# Patient Record
Sex: Female | Born: 1935 | Race: White | Hispanic: No | Smoking: Former smoker
Health system: Southern US, Community
[De-identification: ages and names within clinical notes are randomized; demographics above are authoritative.]

## PROBLEM LIST (undated history)

## (undated) DIAGNOSIS — Z8616 Personal history of COVID-19: Secondary | ICD-10-CM

## (undated) DIAGNOSIS — H3530 Unspecified macular degeneration: Secondary | ICD-10-CM

## (undated) DIAGNOSIS — F32A Depression, unspecified: Secondary | ICD-10-CM

## (undated) DIAGNOSIS — E119 Type 2 diabetes mellitus without complications: Secondary | ICD-10-CM

## (undated) DIAGNOSIS — B0229 Other postherpetic nervous system involvement: Secondary | ICD-10-CM

## (undated) DIAGNOSIS — G629 Polyneuropathy, unspecified: Secondary | ICD-10-CM

## (undated) DIAGNOSIS — K219 Gastro-esophageal reflux disease without esophagitis: Secondary | ICD-10-CM

## (undated) DIAGNOSIS — H548 Legal blindness, as defined in USA: Secondary | ICD-10-CM

## (undated) DIAGNOSIS — D649 Anemia, unspecified: Secondary | ICD-10-CM

## (undated) DIAGNOSIS — I639 Cerebral infarction, unspecified: Secondary | ICD-10-CM

## (undated) DIAGNOSIS — E785 Hyperlipidemia, unspecified: Secondary | ICD-10-CM

## (undated) DIAGNOSIS — I509 Heart failure, unspecified: Secondary | ICD-10-CM

## (undated) DIAGNOSIS — F329 Major depressive disorder, single episode, unspecified: Secondary | ICD-10-CM

## (undated) DIAGNOSIS — I10 Essential (primary) hypertension: Secondary | ICD-10-CM

## (undated) HISTORY — DX: Major depressive disorder, single episode, unspecified: F32.9

## (undated) HISTORY — PX: COLONOSCOPY: SHX174

## (undated) HISTORY — PX: CATARACT EXTRACTION, BILATERAL: SHX1313

## (undated) HISTORY — DX: Heart failure, unspecified: I50.9

## (undated) HISTORY — PX: BREAST SURGERY: SHX581

## (undated) HISTORY — DX: Polyneuropathy, unspecified: G62.9

## (undated) HISTORY — PX: APPENDECTOMY: SHX54

## (undated) HISTORY — PX: ABDOMINAL HYSTERECTOMY: SHX81

## (undated) HISTORY — DX: Depression, unspecified: F32.A

## (undated) HISTORY — DX: Other postherpetic nervous system involvement: B02.29

## (undated) HISTORY — DX: Hyperlipidemia, unspecified: E78.5

## (undated) HISTORY — DX: Cerebral infarction, unspecified: I63.9

## (undated) HISTORY — DX: Anemia, unspecified: D64.9

## (undated) HISTORY — PX: ESOPHAGEAL DILATION: SHX303

## (undated) HISTORY — DX: Personal history of COVID-19: Z86.16

---

## 1997-12-29 ENCOUNTER — Other Ambulatory Visit: Admission: RE | Admit: 1997-12-29 | Discharge: 1997-12-29 | Payer: Self-pay | Admitting: Internal Medicine

## 1999-01-07 ENCOUNTER — Other Ambulatory Visit: Admission: RE | Admit: 1999-01-07 | Discharge: 1999-01-07 | Payer: Self-pay | Admitting: Internal Medicine

## 1999-12-27 ENCOUNTER — Other Ambulatory Visit: Admission: RE | Admit: 1999-12-27 | Discharge: 1999-12-27 | Payer: Self-pay | Admitting: Internal Medicine

## 2003-01-20 ENCOUNTER — Encounter: Admission: RE | Admit: 2003-01-20 | Discharge: 2003-01-20 | Payer: Self-pay | Admitting: Internal Medicine

## 2003-01-28 ENCOUNTER — Other Ambulatory Visit: Admission: RE | Admit: 2003-01-28 | Discharge: 2003-01-28 | Payer: Self-pay | Admitting: Internal Medicine

## 2003-05-22 ENCOUNTER — Encounter: Admission: RE | Admit: 2003-05-22 | Discharge: 2003-05-22 | Payer: Self-pay | Admitting: Neurosurgery

## 2003-06-09 ENCOUNTER — Encounter: Admission: RE | Admit: 2003-06-09 | Discharge: 2003-06-09 | Payer: Self-pay | Admitting: Neurosurgery

## 2016-01-13 ENCOUNTER — Emergency Department: Payer: Medicare Other

## 2016-01-13 ENCOUNTER — Encounter: Payer: Self-pay | Admitting: Emergency Medicine

## 2016-01-13 ENCOUNTER — Emergency Department
Admission: EM | Admit: 2016-01-13 | Discharge: 2016-01-13 | Disposition: A | Discharge status: Home / Self Care | Payer: Medicare Other | Attending: Emergency Medicine | Admitting: Emergency Medicine

## 2016-01-13 DIAGNOSIS — Y929 Unspecified place or not applicable: Secondary | ICD-10-CM | POA: Diagnosis not present

## 2016-01-13 DIAGNOSIS — W57XXXA Bitten or stung by nonvenomous insect and other nonvenomous arthropods, initial encounter: Secondary | ICD-10-CM | POA: Diagnosis not present

## 2016-01-13 DIAGNOSIS — Y939 Activity, unspecified: Secondary | ICD-10-CM | POA: Insufficient documentation

## 2016-01-13 DIAGNOSIS — E119 Type 2 diabetes mellitus without complications: Secondary | ICD-10-CM | POA: Diagnosis not present

## 2016-01-13 DIAGNOSIS — S0086XA Insect bite (nonvenomous) of other part of head, initial encounter: Secondary | ICD-10-CM | POA: Insufficient documentation

## 2016-01-13 DIAGNOSIS — R55 Syncope and collapse: Secondary | ICD-10-CM | POA: Insufficient documentation

## 2016-01-13 DIAGNOSIS — Z87891 Personal history of nicotine dependence: Secondary | ICD-10-CM | POA: Insufficient documentation

## 2016-01-13 DIAGNOSIS — Y999 Unspecified external cause status: Secondary | ICD-10-CM | POA: Insufficient documentation

## 2016-01-13 DIAGNOSIS — I10 Essential (primary) hypertension: Secondary | ICD-10-CM | POA: Insufficient documentation

## 2016-01-13 HISTORY — DX: Type 2 diabetes mellitus without complications: E11.9

## 2016-01-13 HISTORY — DX: Essential (primary) hypertension: I10

## 2016-01-13 HISTORY — DX: Legal blindness, as defined in USA: H54.8

## 2016-01-13 LAB — CBC WITH DIFFERENTIAL/PLATELET
Basophils Absolute: 0.1 10*3/uL (ref 0–0.1)
Basophils Relative: 1 %
Eosinophils Absolute: 0.4 10*3/uL (ref 0–0.7)
Eosinophils Relative: 3 %
HCT: 43.1 % (ref 35.0–47.0)
Hemoglobin: 14.6 g/dL (ref 12.0–16.0)
Lymphocytes Relative: 21 %
Lymphs Abs: 2.3 10*3/uL (ref 1.0–3.6)
MCH: 30.5 pg (ref 26.0–34.0)
MCHC: 33.9 g/dL (ref 32.0–36.0)
MCV: 89.9 fL (ref 80.0–100.0)
Monocytes Absolute: 0.8 10*3/uL (ref 0.2–0.9)
Monocytes Relative: 7 %
Neutro Abs: 7.5 10*3/uL — ABNORMAL HIGH (ref 1.4–6.5)
Neutrophils Relative %: 68 %
Platelets: 285 10*3/uL (ref 150–440)
RBC: 4.8 MIL/uL (ref 3.80–5.20)
RDW: 13.9 % (ref 11.5–14.5)
WBC: 11 10*3/uL (ref 3.6–11.0)

## 2016-01-13 LAB — COMPREHENSIVE METABOLIC PANEL
ALT: 16 U/L (ref 14–54)
AST: 21 U/L (ref 15–41)
Albumin: 3.5 g/dL (ref 3.5–5.0)
Alkaline Phosphatase: 68 U/L (ref 38–126)
Anion gap: 8 (ref 5–15)
BUN: 20 mg/dL (ref 6–20)
CO2: 27 mmol/L (ref 22–32)
Calcium: 8.8 mg/dL — ABNORMAL LOW (ref 8.9–10.3)
Chloride: 100 mmol/L — ABNORMAL LOW (ref 101–111)
Creatinine, Ser: 1.15 mg/dL — ABNORMAL HIGH (ref 0.44–1.00)
GFR calc Af Amer: 48 mL/min — ABNORMAL LOW (ref >60)
GFR calc non Af Amer: 42 mL/min — ABNORMAL LOW (ref >60)
Glucose, Bld: 215 mg/dL — ABNORMAL HIGH (ref 65–99)
Potassium: 4.1 mmol/L (ref 3.5–5.1)
Sodium: 135 mmol/L (ref 135–145)
Total Bilirubin: 0.7 mg/dL (ref 0.3–1.2)
Total Protein: 7.8 g/dL (ref 6.5–8.1)

## 2016-01-13 LAB — TROPONIN I: Troponin I: <0.03 ng/mL (ref <0.03)

## 2016-01-13 MED ORDER — SODIUM CHLORIDE 0.9 % IV BOLUS (SEPSIS)
500.0000 mL | Freq: Once | INTRAVENOUS | Status: AC
  Administered 2016-01-13: 500 mL via INTRAVENOUS

## 2016-01-13 NOTE — ED Triage Notes (Addendum)
Pt from home via [facility]. Reports getting stung by a yellow jacket this afternoon. She states she went to stand up and "blacked out and legs gave out and started violently shaking". Pt reports her friend assisted her to the ground. Denies hitting head or other injury. Denies hx of similar symptoms. Pt's friend reports that at the time of sting, patient was calm, sitting in chair. After several minutes patient became anxious, reporting a feeling of mouth swelling and leg numbness. Pt denies these symptoms upon arrival to ED. Hx of diabetes. Blood glucose for EMS was 284

## 2016-01-13 NOTE — Discharge Instructions (Signed)
As we discussed, your workup today was reassuring.  Though we do not know exactly what is causing your symptoms, it appears that you have no emergent medical condition at this time and that you are safe to go home and follow up as recommended in this paperwork. ° °Please return immediately to the Emergency Department if you develop any new or worsening symptoms that concern you. ° °

## 2016-01-13 NOTE — ED Provider Notes (Signed)
[facility] Emergency Department Provider Note  ____________________________________________   First MD Initiated Contact with Patient 01/13/16 1426     (approximate)  I have reviewed the triage vital signs and the nursing notes.   HISTORY  Chief Complaint Near Syncope    HPI Judy Harvey is a 82 y.o. female who is under hospice care and is confirmed a DNR/DNI who presents by EMS for evaluation of a possible allergic reaction.the patient reports that an insect of some kind landed on the right side of her feet and face along the line of her right chin and stung her.  She has never been stung by a bee or a yellowjacket before and became very upset and started feeling a constellation of symptoms such as shortness of breath, lightheadedness and dizziness, nausea, and anxiety.  She tried to get up and go in the house but had several near syncopal episodes and was "worried that the venom was going to my brain".  The people that were with her at the time reported to her hospice nurse, who came to the emergency department to discuss the case with me, that the patient ving a panic/anxiety attack which does happen to her from time to time. Upon arrival in the emergency department she states that she feels much better and her symptoms have resolved.  They were acute in onset and severe and nothing made them better or worse.   Past Medical History:  Diagnosis Date  . Diabetes mellitus without complication (HCC)   . Hypertension   . Legally blind     There are no active problems to display for this patient.   Past Surgical History:  Procedure Laterality Date  . ABDOMINAL HYSTERECTOMY     partial  . APPENDECTOMY    . BREAST SURGERY     reduction  . CESAREAN SECTION     x3    Prior to Admission medications   Not on File    Allergies Review of patient's allergies indicates no known allergies.  History reviewed. No pertinent family history.  Social  History Social History  Substance Use Topics  . Smoking status: Former Games developermoker  . Smokeless tobacco: Never Used  . Alcohol use No    Review of Systems Constitutional: No fever/chills Eyes: No visual changes. ENT: No sore throat. He will throat tightness, now resolved Cardiovascular: Denies chest pain. Respiratory: +shortness of breath. Gastrointestinal: No abdominal pain.  nausea, no vomiting.  No diarrhea.  No constipation. Genitourinary: Negative for dysuria. Musculoskeletal: Negative for back pain. Skin: Negative for rash. Neurological: Negative for headaches, focal weakness or numbness. Lightheadedness and dizziness and near syncope  10-point ROS otherwise negative.  ____________________________________________   PHYSICAL EXAM:  VITAL SIGNS: ED Triage Vitals  Enc Vitals Group     BP 01/13/16 1427 (!) 162/97     Pulse Rate 01/13/16 1427 94     Resp 01/13/16 1427 20     Temp 01/13/16 1427 97.8 F (36.6 C)     Temp Source 01/13/16 1427 Oral     SpO2 01/13/16 1427 95 %     Weight 01/13/16 1427 180 lb (81.6 kg)     Height 01/13/16 1427 5\' 2"  (1.575 m)     Head Circumference --      Peak Flow --      Pain Score 01/13/16 1428 2     Pain Loc --      Pain Edu? --      Excl. in  GC? --     Constitutional: Alert and oriented. Well appearing and in no acute distress. Eyes: Conjunctivae are normal. Legally blind Head: Atraumatic. Nose: No congestion/rhinnorhea. Mouth/Throat: Mucous membranes are moist.  Oropharynx non-erythematous. Neck: No stridor.  No meningeal signs.   Cardiovascular: Normal rate, regular rhythm. Good peripheral circulation. Grossly normal heart sounds. Respiratory: Normal respiratory effort.  No retractions. Lungs CTAB. Gastrointestinal: Soft and nontender. No distention.  Musculoskeletal: No lower extremity tenderness nor edema. No gross deformities of extremities. Neurologic:  Normal speech and language. No gross focal neurologic deficits are  appreciated.  Skin:  Skin is warm, dry and intact. No rash noted. No evidence of insect bite along the jawline where the patient is indicating. Psychiatric: Mood and affect are normal. Speech and behavior are normal.  ____________________________________________   LABS (all labs ordered are listed, but only abnormal results are displayed)  Labs Reviewed  COMPREHENSIVE METABOLIC PANEL - Abnormal; Notable for the following:       Result Value   Chloride 100 (*)    Glucose, Bld 215 (*)    Creatinine, Ser 1.15 (*)    Calcium 8.8 (*)    GFR calc non Af Amer 42 (*)    GFR calc Af Amer 48 (*)    All other components within normal limits  CBC WITH DIFFERENTIAL/PLATELET - Abnormal; Notable for the following:    Neutro Abs 7.5 (*)    All other components within normal limits  TROPONIN I  URINALYSIS COMPLETEWITH MICROSCOPIC ([facility] ONLY)   ____________________________________________  EKG  ED ECG REPORT I, Clorissa Gruenberg, the attending physician, personally viewed and interpreted this ECG.  Date: 01/13/2016 EKG Time: 2:22 PM Rate: 90 Rhythm: normal sinus rhythm QRS Axis: normal Intervals: right bundle-branch block and left anterior fascicular block ST/T Wave abnormalities: normal Conduction Disturbances: none Narrative Interpretation: unremarkable  ____________________________________________  RADIOLOGY   Ct Head Wo Contrast  Result Date: 01/13/2016 CLINICAL DATA:  Seizure with apparent fall EXAM: CT HEAD WITHOUT CONTRAST TECHNIQUE: Contiguous axial images were obtained from the base of the skull through the vertex without intravenous contrast. COMPARISON:  None. FINDINGS: Brain: There is mild diffuse atrophy. There is no intracranial mass, hemorrhage, extra-axial fluid collection, or midline shift. 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 Small vessel disease is also noted in the right thalamus. There is patchy small vessel disease in the  centra semiovale bilaterally. No acute appearing infarct is evident. Note that there is an apparent deep sulcus in the right medial temporal lobe, an apparent anatomic variant. Vascular: There is no demonstrable hyperdense vessel. There is calcification in the distal left vertebral artery as well as in the carotid siphon regions bilaterally. Skull: Bony calvarium appears intact. Sinuses/Orbits: There is opacification in a posterior ethmoid air cell on the right. Other paranasal sinuses which are visualized are clear. Orbits appear symmetric bilaterally. Other: Visualized mastoid air cells are clear. IMPRESSION: Atrophy with periventricular small vessel disease. There is small vessel disease in the right thalamus with evidence of a prior focal lacunar infarct in the left thalamus. No acute infarct evident. No intracranial mass, hemorrhage, or extra-axial fluid collection. There are foci of arterial vascular calcification at several sites. There is opacification of a posterior ethmoid air cell on the right. Electronically Signed   By: Bretta Bang III M.D.   On: 01/13/2016 14:59    ____________________________________________   PROCEDURES  Procedure(s) performed:   Procedures   Critical Care performed: No  ____________________________________________   INITIAL IMPRESSION / ASSESSMENT AND PLAN / ED COURSE  Pertinent labs & imaging results that were available during my care of the patient were reviewed by me and considered in my medical decision making (see chart for details).  The patient has a reassuring workup.  I had several in person conversations with her hospice nurse and everyone who knows her and provides care for her and with her is comfortable that she was having a panic attack after feeling the insect bite.  There is no evidence of allergic reaction or anaphylaxis and there is no suggestion that she is having any sort of cardiac abnormality.  The patient states that she feels much  better and is ready to go home.  She is convinced it was an insect bite that caused all of her symptoms.  There is no additional management recommended at this time and the hospice provider is comfortable taking her home  Clinical Course    ____________________________________________  FINAL CLINICAL IMPRESSION(S) / ED DIAGNOSES  Final diagnoses:  Near syncope  Insect bite, initial encounter     MEDICATIONS GIVEN DURING THIS VISIT:  Medications  sodium chloride 0.9 % bolus 500 mL (not administered)     NEW OUTPATIENT MEDICATIONS STARTED DURING THIS VISIT:  New Prescriptions   No medications on file    Modified Medications   No medications on file    Discontinued Medications   No medications on file     Note:  This document was prepared using Dragon voice recognition software and may include unintentional dictation errors.    Loleta Roseory Donley Harland, MD 01/13/16 [phone]

## 2016-01-13 NOTE — Progress Notes (Signed)
ED visit made. Patient is currently followed by [facility] at home with a  Hospice diagnosis of Heart failure. She is a DNR code.  Patient came to ED via EMS for evaluation after being stung by a bee and "passing out". Head CT negative for any acute process, labs WNL. Writer spoke with ED attending physician Dr. York CeriseForbach, plan is for discharge home. Patient seen lying on the ED stretcher, friend Olegario MessierKathy at bedside. Patient alert and oriented, able to recall the event. She was sitting on her porch and was stung "by a yellow jacket". She feels better now and is in agreement with discharge. Emotional support given. Hospice team made aware of discharge.  Notes faxed to Hospice triage. Dayna BarkerKaren Robertson RN, BSN, [facility], hospital Liaison [phone] c

## 2016-03-05 ENCOUNTER — Emergency Department
Admission: EM | Admit: 2016-03-05 | Discharge: 2016-03-06 | Disposition: A | Discharge status: Home / Self Care | Payer: Medicare Other | Attending: Emergency Medicine | Admitting: Emergency Medicine

## 2016-03-05 ENCOUNTER — Encounter: Payer: Self-pay | Admitting: *Deleted

## 2016-03-05 ENCOUNTER — Emergency Department: Payer: Medicare Other

## 2016-03-05 DIAGNOSIS — I10 Essential (primary) hypertension: Secondary | ICD-10-CM | POA: Insufficient documentation

## 2016-03-05 DIAGNOSIS — E119 Type 2 diabetes mellitus without complications: Secondary | ICD-10-CM | POA: Insufficient documentation

## 2016-03-05 DIAGNOSIS — Z87891 Personal history of nicotine dependence: Secondary | ICD-10-CM | POA: Insufficient documentation

## 2016-03-05 DIAGNOSIS — R55 Syncope and collapse: Secondary | ICD-10-CM | POA: Diagnosis present

## 2016-03-05 LAB — BASIC METABOLIC PANEL
Anion gap: 8 (ref 5–15)
BUN: 16 mg/dL (ref 6–20)
CO2: 27 mmol/L (ref 22–32)
Calcium: 8.9 mg/dL (ref 8.9–10.3)
Chloride: 103 mmol/L (ref 101–111)
Creatinine, Ser: 0.77 mg/dL (ref 0.44–1.00)
GFR calc Af Amer: >60 mL/min (ref >60)
GFR calc non Af Amer: >60 mL/min (ref >60)
Glucose, Bld: 112 mg/dL — ABNORMAL HIGH (ref 65–99)
Potassium: 3.7 mmol/L (ref 3.5–5.1)
Sodium: 138 mmol/L (ref 135–145)

## 2016-03-05 LAB — GLUCOSE, CAPILLARY: Glucose-Capillary: 109 mg/dL — ABNORMAL HIGH (ref 65–99)

## 2016-03-05 LAB — TROPONIN I: Troponin I: <0.03 ng/mL (ref <0.03)

## 2016-03-05 LAB — CBC
HCT: 43.9 % (ref 35.0–47.0)
Hemoglobin: 14.8 g/dL (ref 12.0–16.0)
MCH: 30.6 pg (ref 26.0–34.0)
MCHC: 33.7 g/dL (ref 32.0–36.0)
MCV: 90.9 fL (ref 80.0–100.0)
Platelets: 279 10*3/uL (ref 150–440)
RBC: 4.83 MIL/uL (ref 3.80–5.20)
RDW: 14.1 % (ref 11.5–14.5)
WBC: 10.5 10*3/uL (ref 3.6–11.0)

## 2016-03-05 MED ORDER — SODIUM CHLORIDE 0.9 % IV BOLUS (SEPSIS)
500.0000 mL | Freq: Once | INTRAVENOUS | Status: AC
  Administered 2016-03-05: 500 mL via INTRAVENOUS

## 2016-03-05 NOTE — ED Triage Notes (Signed)
Pt arrived to ED via [facility] after a reported near-syncopal episode. EMS reports pt was anxious upon EMS arrival to scene. Pt has a hx of HTN but has a BP of 224/110 with EMS with a heart rate of 78 taht EMS reports is showing a 1st degree AV block and RBB per 12 lead. Pt deneis chest pain or SOB at this time but is reporting heaviness in both of her legs. PT also has hx of diabetes and presents to ED with a CBG of 87. Pt alert and oriented x 4.

## 2016-03-05 NOTE — ED Provider Notes (Signed)
[facility] Emergency Department Provider Note   ____________________________________________   I have reviewed the triage vital signs and the nursing notes.   HISTORY  Chief Complaint Hypertension and Near Syncope   History limited by: Not Limited   HPI Judy Harvey is a 82 y.o. female who presents to the emergency department today after a near syncopal episode. The symptoms started this evening as the patient was preparing to go to dinner. Once at the restaurant she continued to feel weak and felt like both legs were weak. The patient then felt like she was going to pass out. Per EMS glucose was 83 on arrival and patient was hypertensive. Family thinks she is normally in the 200s. No recent illness. No fevers.    Past Medical History:  Diagnosis Date  . Diabetes mellitus without complication (HCC)   . Hypertension   . Legally blind     There are no active problems to display for this patient.   Past Surgical History:  Procedure Laterality Date  . ABDOMINAL HYSTERECTOMY     partial  . APPENDECTOMY    . BREAST SURGERY     reduction  . CESAREAN SECTION     x3    Prior to Admission medications   Not on File    Allergies Patient has no known allergies.  History reviewed. No pertinent family history.  Social History Social History  Substance Use Topics  . Smoking status: Former Games developermoker  . Smokeless tobacco: Never Used  . Alcohol use No    Review of Systems  Constitutional: Negative for fever. Positive for weakness. Cardiovascular: Negative for chest pain. Respiratory: Negative for shortness of breath. Gastrointestinal: Negative for abdominal pain, vomiting and diarrhea. Neurological: Negative for headaches, focal weakness or numbness.  10-point ROS otherwise negative.  ____________________________________________   PHYSICAL EXAM:  VITAL SIGNS: ED Triage Vitals  Enc Vitals Group     BP 03/05/16 2049 (!) 193/98   Pulse Rate 03/05/16 2049 71     Resp 03/05/16 2049 17     Temp 03/05/16 2049 97.8 F (36.6 C)     Temp Source 03/05/16 2049 Oral     SpO2 03/05/16 2049 96 %     Weight 03/05/16 2054 180 lb (81.6 kg)     Height 03/05/16 2054 5\' 2"  (1.575 m)   Constitutional: Alert and oriented. Well appearing and in no distress. Eyes: Conjunctivae are normal. Normal extraocular movements. ENT   Head: Normocephalic and atraumatic.   Nose: No congestion/rhinnorhea.   Mouth/Throat: Mucous membranes are moist.   Neck: No stridor. Hematological/Lymphatic/Immunilogical: No cervical lymphadenopathy. Cardiovascular: Normal rate, regular rhythm.  No murmurs, rubs, or gallops.  Respiratory: Normal respiratory effort without tachypnea nor retractions. Breath sounds are clear and equal bilaterally. No wheezes/rales/rhonchi. Gastrointestinal: Soft and non tender. No rebound. No guarding.  Genitourinary: Deferred Musculoskeletal: Normal range of motion in all extremities. No lower extremity edema. Neurologic:  Normal speech and language. No gross focal neurologic deficits are appreciated.  Skin:  Skin is warm, dry and intact. No rash noted. Psychiatric: Mood and affect are normal. Speech and behavior are normal. Patient exhibits appropriate insight and judgment.  ____________________________________________    LABS (pertinent positives/negatives)  Labs Reviewed  BASIC METABOLIC PANEL - Abnormal; Notable for the following:       Result Value   Glucose, Bld 112 (*)    All other components within normal limits  GLUCOSE, CAPILLARY - Abnormal; Notable for the following:    Glucose-Capillary 109 (*)  All other components within normal limits  CBC  TROPONIN I  URINALYSIS, COMPLETE ([facility]) WITH MICROSCOPIC  TROPONIN I  CBG MONITORING, ED   2nd trop pending at time of sign out.  ____________________________________________   EKG  I, Phineas SemenGraydon Jhoana Upham, attending physician, personally viewed and  interpreted this EKG  EKG Time: 2050 Rate: 82 Rhythm: normal sinus rhythm with PVC Axis: left axis deviation Intervals: qtc 493 WJX:[id number]:RBBB, LAFB ST changes: no st elevation Impression: abnormal ekg   ____________________________________________    RADIOLOGY  CXR IMPRESSION: No active cardiopulmonary disease. ____________________________________________   PROCEDURES  Procedures  ____________________________________________   INITIAL IMPRESSION / ASSESSMENT AND PLAN / ED COURSE  Pertinent labs & imaging results that were available during my care of the patient were reviewed by me and considered in my medical decision making (see chart for details).  Patient presented to the emergency department today after what sounds like a near syncopal episode. On exam here patient states that she is feeling back to baseline. Initial troponin was negative. Will plan on checking a second troponin. No addition it sounds like the patient's blood level was a little low when EMS arrived. Patient did state that she was eating dinner to slightly later time so wonder if this also played a role. EKG and chest x-ray without any concerning findings.  ____________________________________________   FINAL CLINICAL IMPRESSION(S) / ED DIAGNOSES  Near syncope  Note: This dictation was prepared with Dragon dictation. Any transcriptional errors that result from this process are unintentional     Phineas SemenGraydon Fritz Cauthon, MD 03/05/16 2322

## 2016-03-05 NOTE — Discharge Instructions (Addendum)
Please seek medical attention for any high fevers, chest pain, shortness of breath, change in behavior, persistent vomiting, bloody stool or any other new or concerning symptoms.  

## 2016-03-06 LAB — URINALYSIS, COMPLETE (UACMP) WITH MICROSCOPIC
Bacteria, UA: NONE SEEN
Bilirubin Urine: NEGATIVE
Glucose, UA: NEGATIVE mg/dL
Hgb urine dipstick: NEGATIVE
Ketones, ur: NEGATIVE mg/dL
Leukocytes, UA: NEGATIVE
Nitrite: NEGATIVE
Protein, ur: NEGATIVE mg/dL
Specific Gravity, Urine: 1.017 (ref 1.005–1.030)
pH: 5 (ref 5.0–8.0)

## 2016-03-06 LAB — TROPONIN I: Troponin I: <0.03 ng/mL (ref <0.03)

## 2016-03-06 NOTE — ED Provider Notes (Signed)
-----------------------------------------   1:27 AM on 03/06/2016 -----------------------------------------   Blood pressure (!) 165/57, pulse 74, temperature 97.8 F (36.6 C), temperature source Oral, resp. rate (!) 21, height 5\' 2"  (1.575 m), weight 180 lb (81.6 kg), SpO2 96 %.  Assuming care from Dr. Zenda AlpersWebster.  In short, Judy MorgansMildred J Harvey is a 82 y.o. female with a chief complaint of Hypertension and Near Syncope .  Refer to the original H&P for additional details.  The current plan of care is to follow up the repeat troponin.  The patient's repeat troponin is negative. She'll be discharged to home to follow up with her primary care physician.    Rebecka ApleyAllison P Javonte Elenes, MD 03/06/16 [phone]

## 2016-03-24 ENCOUNTER — Ambulatory Visit (INDEPENDENT_AMBULATORY_CARE_PROVIDER_SITE_OTHER): Payer: Medicare Other | Admitting: Family Medicine

## 2016-03-24 ENCOUNTER — Encounter: Payer: Self-pay | Admitting: Family Medicine

## 2016-03-24 ENCOUNTER — Other Ambulatory Visit: Payer: Self-pay | Admitting: Family Medicine

## 2016-03-24 ENCOUNTER — Telehealth: Payer: Self-pay

## 2016-03-24 VITALS — BP 118/76 | HR 78 | Resp 16 | Ht 62.0 in

## 2016-03-24 DIAGNOSIS — F418 Other specified anxiety disorders: Secondary | ICD-10-CM | POA: Diagnosis not present

## 2016-03-24 DIAGNOSIS — G2581 Restless legs syndrome: Secondary | ICD-10-CM

## 2016-03-24 DIAGNOSIS — S2239XA Fracture of one rib, unspecified side, initial encounter for closed fracture: Secondary | ICD-10-CM | POA: Insufficient documentation

## 2016-03-24 DIAGNOSIS — I10 Essential (primary) hypertension: Secondary | ICD-10-CM | POA: Insufficient documentation

## 2016-03-24 DIAGNOSIS — H548 Legal blindness, as defined in USA: Secondary | ICD-10-CM

## 2016-03-24 DIAGNOSIS — F32A Depression, unspecified: Secondary | ICD-10-CM | POA: Insufficient documentation

## 2016-03-24 DIAGNOSIS — S2249XA Multiple fractures of ribs, unspecified side, initial encounter for closed fracture: Secondary | ICD-10-CM | POA: Insufficient documentation

## 2016-03-24 DIAGNOSIS — Z8673 Personal history of transient ischemic attack (TIA), and cerebral infarction without residual deficits: Secondary | ICD-10-CM | POA: Insufficient documentation

## 2016-03-24 DIAGNOSIS — Z23 Encounter for immunization: Secondary | ICD-10-CM | POA: Diagnosis not present

## 2016-03-24 DIAGNOSIS — S2231XD Fracture of one rib, right side, subsequent encounter for fracture with routine healing: Secondary | ICD-10-CM

## 2016-03-24 DIAGNOSIS — M791 Myalgia, unspecified site: Secondary | ICD-10-CM | POA: Insufficient documentation

## 2016-03-24 DIAGNOSIS — Z8719 Personal history of other diseases of the digestive system: Secondary | ICD-10-CM | POA: Insufficient documentation

## 2016-03-24 DIAGNOSIS — H3530 Unspecified macular degeneration: Secondary | ICD-10-CM | POA: Diagnosis not present

## 2016-03-24 DIAGNOSIS — R110 Nausea: Secondary | ICD-10-CM | POA: Insufficient documentation

## 2016-03-24 DIAGNOSIS — K5900 Constipation, unspecified: Secondary | ICD-10-CM

## 2016-03-24 DIAGNOSIS — B0229 Other postherpetic nervous system involvement: Secondary | ICD-10-CM | POA: Insufficient documentation

## 2016-03-24 DIAGNOSIS — E1140 Type 2 diabetes mellitus with diabetic neuropathy, unspecified: Secondary | ICD-10-CM

## 2016-03-24 DIAGNOSIS — R269 Unspecified abnormalities of gait and mobility: Secondary | ICD-10-CM | POA: Insufficient documentation

## 2016-03-24 DIAGNOSIS — E119 Type 2 diabetes mellitus without complications: Secondary | ICD-10-CM | POA: Insufficient documentation

## 2016-03-24 DIAGNOSIS — Z794 Long term (current) use of insulin: Principal | ICD-10-CM

## 2016-03-24 DIAGNOSIS — R2681 Unsteadiness on feet: Secondary | ICD-10-CM

## 2016-03-24 MED ORDER — TRAMADOL HCL 50 MG PO TABS: 50.0000 mg | ORAL_TABLET | Freq: Three times a day (TID) | ORAL | 0 refills | Status: DC | PRN

## 2016-03-24 MED ORDER — ROPINIROLE HCL 1 MG PO TABS: 1.0000 mg | ORAL_TABLET | Freq: Every day | ORAL | 0 refills | Status: DC

## 2016-03-24 MED ORDER — ZOSTER VACCINE LIVE 19400 UNT/0.65ML ~~LOC~~ SUSR: 0.6500 mL | Freq: Once | SUBCUTANEOUS | 0 refills | Status: AC

## 2016-03-24 NOTE — Telephone Encounter (Signed)
Yes they will, I will reorder at next visit.

## 2016-03-24 NOTE — Telephone Encounter (Signed)
Lab told them they will be billed and they said it is very expensive

## 2016-03-24 NOTE — Telephone Encounter (Signed)
Medicare will not cover Vitamin labs. Did not get them.

## 2016-03-24 NOTE — Patient Instructions (Signed)
Take tramadol instead of Vicodin as needed for leg pain. Stop lorazepam and temazepam.

## 2016-03-24 NOTE — Progress Notes (Addendum)
Date:  03/24/2016   Name:  Judy Harvey   DOB:  [birth year]   MRN:  [id number]  PCP:  No PCP Per Patient    Chief Complaint: Establish Care   History of Present Illness:  This is a 83 y.o. female seen for initial visit. [place] [facility] was on hospice for unclear reason until 12/20 when discharged. Requip stopped and started on Vicodin which she takes 2-4 times daily for leg pain and PHN. Describes leg pain as aching, mostly at night, has to get out of bed but also c/o numbness and tingling of feet. Known T2DM on insulin for years. Old CVA on CT scan in November and old R rib fxs on CXR in December. Depression/anxiety on Wellbutrin for years, unsure if helping. Recent episode severe constipation x 3d which resolved with mg citrate. Hx esophageal stricture, sxs starting to recur, has had esophagus stretched in past. [place]. Hx PHN s/p multiple episodes zoster. HTN well controlled on lisinopril/HCTZ. Takes Ativan and Restoril at night to sleep. Hospice stopped pravastatin, tried on Lyrica which she did not tolerate due to body spasms. Knowm MD for which she gets shots in eye from optho, legally blind. Had flu imm in September and pneumo imm years ago. Thinks she is losing weight. Gets frequently nausea and uses Zofran daily. Unsteady on feet, uses cane, no recent falls.  Review of Systems:  Review of Systems  Constitutional: Positive for fatigue. Negative for fever.  HENT: Negative for ear pain and sore throat.   Eyes: Negative for pain.  Respiratory: Negative for cough and shortness of breath.   Cardiovascular: Negative for chest pain and leg swelling.  Gastrointestinal: Negative for abdominal pain.  Endocrine: Negative for polydipsia and polyuria.  Genitourinary: Negative for dysuria.  Neurological: Positive for weakness. Negative for tremors, syncope and light-headedness.  Hematological: Negative for adenopathy.   Psychiatric/Behavioral: Negative for agitation and confusion.    Patient Active Problem List   Diagnosis Date Noted  . Controlled type 2 diabetes mellitus with diabetic neuropathy (HCC) 03/24/2016  . Old cerebrovascular accident (CVA) without late effect 03/24/2016  . Hypertension 03/24/2016  . Legally blind 03/24/2016  . Macular degeneration 03/24/2016  . Rib fractures 03/24/2016  . Constipation 03/24/2016  . History of esophageal stricture 03/24/2016  . Depression with anxiety 03/24/2016  . Restless leg syndrome 03/24/2016  . Myalgia 03/24/2016    Prior to Admission medications   Medication Sig Start Date End Date Taking? Authorizing Provider  buPROPion (WELLBUTRIN XL) 150 MG 24 hr tablet Take 150 mg by mouth daily.   Yes Historical Provider, MD  hydrochlorothiazide (HYDRODIURIL) 25 MG tablet Take 25 mg by mouth daily.   Yes Historical Provider, MD  insulin glargine (LANTUS) 100 UNIT/ML injection Inject 46 Units into the skin at bedtime.   Yes Historical Provider, MD  insulin lispro (HUMALOG) 100 UNIT/ML injection Inject 5 Units into the skin 3 (three) times daily before meals.   Yes Historical Provider, MD  lisinopril (PRINIVIL,ZESTRIL) 2.5 MG tablet Take 2.5 mg by mouth daily.   Yes Historical Provider, MD  ondansetron (ZOFRAN) 8 MG tablet Take by mouth every 8 (eight) hours as needed for nausea or vomiting.   Yes Historical Provider, MD  Sennosides-Docusate Sodium (STOOL SOFTENER LAXATIVE PO) Take by mouth.   Yes Historical Provider, MD  rOPINIRole (REQUIP) 1 MG tablet Take 1 tablet (1 mg total) by mouth at bedtime. 03/24/16   Schuyler AmorWilliam Makenleigh Crownover, MD  traMADol (ULTRAM) 50 MG tablet Take 1 tablet (50 mg total) by mouth every 8 (eight) hours as needed. 03/24/16   Schuyler Amor, MD    No Known Allergies  Past Surgical History:  Procedure Laterality Date  . ABDOMINAL HYSTERECTOMY     partial  . APPENDECTOMY    . BREAST SURGERY     reduction  . CESAREAN SECTION     x3    Social  History  Substance Use Topics  . Smoking status: Former Games developer  . Smokeless tobacco: Never Used  . Alcohol use No    Family History  Problem Relation Age of Onset  . Family history unknown: Yes    Medication list has been reviewed and updated.  Physical Examination: BP 118/76   Pulse 78   Resp 16   Ht 5\' 2"  (1.575 m)   SpO2 98%   Physical Exam  Constitutional: She is oriented to person, place, and time. She appears well-developed and well-nourished.  HENT:  Right Ear: External ear normal.  Left Ear: External ear normal.  Nose: Nose normal.  Mouth/Throat: Oropharynx is clear and moist.  R ear occluded with cerumen  Eyes: Conjunctivae and EOM are normal. Pupils are equal, round, and reactive to light.  Neck: Neck supple. No thyromegaly present.  Cardiovascular: Normal rate, regular rhythm and normal heart sounds.   Pulmonary/Chest: Effort normal and breath sounds normal.  Abdominal: Soft. She exhibits no distension and no mass. There is no tenderness.  Musculoskeletal: She exhibits no edema.  Lymphadenopathy:    She has no cervical adenopathy.  Neurological: She is alert and oriented to person, place, and time. Coordination normal.  Romberg positive, gait wide-based and unsteady  Skin: Skin is warm and dry.  Psychiatric: She has a normal mood and affect. Her behavior is normal.  Nursing note and vitals reviewed.   Assessment and Plan:  1. Controlled type 2 diabetes mellitus with diabetic neuropathy, with long-term current use of insulin (HCC) Unclear control, d/c Vicodin, begin tramadol, consider conversion to gabapentin next visit - HgB A1c - TSH - B12  2. Essential hypertension Well controlled on lisinopril/HCTZ, consider MCR next visit  3. Old cerebrovascular accident (CVA) without late effect Consider asa next visit  4. Macular degeneration Consider optho referral  5. Legally blind  6. Closed fracture of one rib of right side with routine healing,  subsequent encounter - Vitamin D (25 hydroxy)  7. Constipation, unspecified constipation type Vicodin likely exacerbating, cont Senokot daily for now, consider fiber supplement  8. History of esophageal stricture Consider GI referral  9. Depression with anxiety Marginal control on Wellbutrin, consider change to SSRI/SNRI  10. Restless leg syndrome Restart Requip, consider gabpentin  11. Myalgia Check vit D, TSH  12. Postherpetic neuralgia Consider gabapentin, intolerant Lyrica  13. Gait instability Consider PT referral  14. Nausea May improve off Vicodin  Return in about 1 week (around 03/31/2016).  45 minutes spent with pt/son, over half in counseling  Deshanda Molitor M. [name] MD [facility]  03/24/2016   Addendum: Meds reviewed, consider adding asa, changing Wellbutrin to Cymbalta, d/c HCTZ, changing Requip to gabapentin with titration, and changing tramadol to Tylenol. Needs MCR and GI referral next visit

## 2016-03-24 NOTE — Addendum Note (Signed)
Addended by: Arthur HolmsLYNCH, TARA L on: 03/24/2016 03:00 PM   Modules accepted: Orders

## 2016-03-25 ENCOUNTER — Other Ambulatory Visit: Payer: Self-pay | Admitting: Family Medicine

## 2016-03-25 DIAGNOSIS — E559 Vitamin D deficiency, unspecified: Secondary | ICD-10-CM

## 2016-03-25 LAB — VITAMIN D 25 HYDROXY (VIT D DEFICIENCY, FRACTURES): Vit D, 25-Hydroxy: 11 ng/mL — ABNORMAL LOW (ref 30.0–100.0)

## 2016-03-25 LAB — HEMOGLOBIN A1C
Est. average glucose Bld gHb Est-mCnc: 169 mg/dL
Hgb A1c MFr Bld: 7.5 % — ABNORMAL HIGH (ref 4.8–5.6)

## 2016-03-25 LAB — TSH: TSH: 0.723 u[IU]/mL (ref 0.450–4.500)

## 2016-03-25 LAB — VITAMIN B12: Vitamin B-12: 554 pg/mL (ref 232–1245)

## 2016-03-25 MED ORDER — VITAMIN D 50 MCG (2000 UT) PO CAPS: 1.0000 | ORAL_CAPSULE | Freq: Every day | ORAL | Status: DC

## 2016-03-25 NOTE — Progress Notes (Signed)
Vitamin D started, consider asa next visit

## 2016-03-31 ENCOUNTER — Ambulatory Visit: Payer: Medicare Other | Admitting: Family Medicine

## 2016-04-01 ENCOUNTER — Ambulatory Visit (INDEPENDENT_AMBULATORY_CARE_PROVIDER_SITE_OTHER): Payer: Medicare Other | Admitting: Family Medicine

## 2016-04-01 ENCOUNTER — Encounter: Payer: Self-pay | Admitting: Family Medicine

## 2016-04-01 VITALS — BP 130/88 | HR 72 | Ht 62.0 in | Wt 175.0 lb

## 2016-04-01 DIAGNOSIS — I10 Essential (primary) hypertension: Secondary | ICD-10-CM

## 2016-04-01 DIAGNOSIS — Z794 Long term (current) use of insulin: Secondary | ICD-10-CM

## 2016-04-01 DIAGNOSIS — R2681 Unsteadiness on feet: Secondary | ICD-10-CM

## 2016-04-01 DIAGNOSIS — F418 Other specified anxiety disorders: Secondary | ICD-10-CM

## 2016-04-01 DIAGNOSIS — R110 Nausea: Secondary | ICD-10-CM

## 2016-04-01 DIAGNOSIS — E1140 Type 2 diabetes mellitus with diabetic neuropathy, unspecified: Secondary | ICD-10-CM

## 2016-04-01 DIAGNOSIS — K5900 Constipation, unspecified: Secondary | ICD-10-CM

## 2016-04-01 DIAGNOSIS — Z8719 Personal history of other diseases of the digestive system: Secondary | ICD-10-CM

## 2016-04-01 DIAGNOSIS — G2581 Restless legs syndrome: Secondary | ICD-10-CM

## 2016-04-01 DIAGNOSIS — Z8673 Personal history of transient ischemic attack (TIA), and cerebral infarction without residual deficits: Secondary | ICD-10-CM

## 2016-04-01 DIAGNOSIS — H3530 Unspecified macular degeneration: Secondary | ICD-10-CM

## 2016-04-01 DIAGNOSIS — E559 Vitamin D deficiency, unspecified: Secondary | ICD-10-CM

## 2016-04-01 LAB — GLUCOSE, POCT (MANUAL RESULT ENTRY): POC Glucose: 135 mg/dl — AB (ref 70–99)

## 2016-04-01 MED ORDER — ASPIRIN 81 MG PO TABS: 81.0000 mg | ORAL_TABLET | Freq: Every day | ORAL | Status: DC

## 2016-04-01 MED ORDER — GABAPENTIN 300 MG PO CAPS: 300.0000 mg | ORAL_CAPSULE | Freq: Every day | ORAL | 2 refills | Status: DC

## 2016-04-01 MED ORDER — LISINOPRIL 5 MG PO TABS: 5.0000 mg | ORAL_TABLET | Freq: Every day | ORAL | 3 refills | Status: DC

## 2016-04-01 MED ORDER — DULOXETINE HCL 30 MG PO CPEP: 30.0000 mg | ORAL_CAPSULE | Freq: Every day | ORAL | 2 refills | Status: DC

## 2016-04-01 NOTE — Patient Instructions (Addendum)
Stop hydrochlorothiazide, increase lisinopril to 5 mg daily. Begin OTC vitamin D3 2000 units daily. Begin aspirin 81 mg daily. Change Wellbutrin to Cymbalta 30 mg daily in morning. Change Requip to gabapentin 300 mg daily at bedtime.

## 2016-04-01 NOTE — Progress Notes (Signed)
Date:  04/01/2016   Name:  Judy Harvey   DOB:  [birth year]   MRN:  [id number]  PCP:  No PCP Per Patient    Chief Complaint: Follow-up (to discuss labs and needs Rx for Wellbutrin) and restless leg ("one requip is not holding me through the night")   History of Present Illness:  This is a 83 y.o. female seen in one week f/u from initial visit. At that time Restoril and Ativan stopped and Vicodin changed to tramadol but she apparently never filled tramadol rx. Requip restarted for RLS. Blood work showed adequate T2DM control and vit D deficiency, has not started supp yet. C/o poor RLS control and BLE pain at night, also intermittent nausea and dizziness, not checking sugars at home. Out of Wellbutrin, admits to depression and anxiety. Constipation about the same. [place]  Review of Systems:  Review of Systems  Constitutional: Negative for chills and fever.  HENT: Negative for trouble swallowing.   Respiratory: Negative for cough and shortness of breath.   Cardiovascular: Negative for chest pain and leg swelling.  Gastrointestinal: Negative for abdominal pain.  Genitourinary: Negative for difficulty urinating.  Neurological: Negative for tremors and syncope.  Psychiatric/Behavioral: Negative for agitation and confusion.    Patient Active Problem List   Diagnosis Date Noted  . Vitamin D deficiency 03/25/2016  . Controlled type 2 diabetes mellitus with diabetic neuropathy (HCC) 03/24/2016  . Old cerebrovascular accident (CVA) without late effect 03/24/2016  . Hypertension 03/24/2016  . Legally blind 03/24/2016  . Macular degeneration 03/24/2016  . Constipation 03/24/2016  . History of esophageal stricture 03/24/2016  . Depression with anxiety 03/24/2016  . Restless leg syndrome 03/24/2016  . Postherpetic neuralgia 03/24/2016  . Gait instability 03/24/2016  . Nausea 03/24/2016    Prior to Admission medications   Medication Sig  Start Date End Date Taking? Authorizing Provider  insulin glargine (LANTUS) 100 UNIT/ML injection Inject 46 Units into the skin at bedtime.   Yes Historical Provider, MD  insulin lispro (HUMALOG) 100 UNIT/ML injection Inject 5 Units into the skin 3 (three) times daily before meals.   Yes Historical Provider, MD  lisinopril (PRINIVIL,ZESTRIL) 5 MG tablet Take 1 tablet (5 mg total) by mouth daily. 04/01/16  Yes Schuyler AmorWilliam Jozette Castrellon, MD  ondansetron (ZOFRAN) 8 MG tablet Take by mouth every 8 (eight) hours as needed for nausea or vomiting.   Yes Historical Provider, MD  Sennosides-Docusate Sodium (STOOL SOFTENER LAXATIVE PO) Take by mouth.   Yes Historical Provider, MD  aspirin 81 MG tablet Take 1 tablet (81 mg total) by mouth daily. 04/01/16   Schuyler AmorWilliam Tunisia Landgrebe, MD  Cholecalciferol (VITAMIN D) 2000 units CAPS Take 1 capsule (2,000 Units total) by mouth daily. Patient not taking: Reported on 04/01/2016 03/25/16   Schuyler AmorWilliam Jorden Minchey, MD  DULoxetine (CYMBALTA) 30 MG capsule Take 1 capsule (30 mg total) by mouth daily. 04/01/16   Schuyler AmorWilliam Bernadetta Roell, MD  gabapentin (NEURONTIN) 300 MG capsule Take 1 capsule (300 mg total) by mouth at bedtime. 04/01/16   Schuyler AmorWilliam Lavonte Palos, MD    Allergies  Allergen Reactions  . Lyrica [Pregabalin]     Body spasms    Past Surgical History:  Procedure Laterality Date  . ABDOMINAL HYSTERECTOMY     partial  . APPENDECTOMY    . BREAST SURGERY     reduction  . CESAREAN SECTION     x3    Social History  Substance Use Topics  .  Smoking status: Former Games developer  . Smokeless tobacco: Never Used  . Alcohol use No    Family History  Problem Relation Age of Onset  . Family history unknown: Yes    Medication list has been reviewed and updated.  Physical Examination: BP 130/88   Pulse 72   Ht 5\' 2"  (1.575 m)   Wt 175 lb (79.4 kg)   BMI 32.01 kg/m   Physical Exam  Constitutional: She is oriented to person, place, and time. She appears well-developed and well-nourished.  Cardiovascular:  Normal rate, regular rhythm and normal heart sounds.   Pulmonary/Chest: Breath sounds normal.  Musculoskeletal: She exhibits no edema.  Neurological: She is alert and oriented to person, place, and time.  Skin: Skin is warm and dry.  Psychiatric: She has a normal mood and affect. Her behavior is normal.  Nursing note and vitals reviewed.   Assessment and Plan:  1. Controlled type 2 diabetes mellitus with diabetic neuropathy, with long-term current use of insulin (HCC) FSBG 135 today. a1c 7.5% last week, cont current regimen, consider home glucometer next visit - POCT Glucose (CBG)  2. Essential hypertension Adequate control but HCTZ may be contributing to DM/dizziness, d/c HCTZ, increase lisinopril to 5 mg daily, consider MCR next visit  3. Constipation, unspecified constipation type Continue daily Senokot  4. Old cerebrovascular accident (CVA) without late effect Begin asa  5. Restless leg syndrome Change Requip to gabapentin, should also help with neuropathy  6. Depression with anxiety Change Wellbutrin to Cymbalta, should also help with neuropathy  7. Vitamin D deficiency Begin OTC vitamin D3  8. Macular degeneration Consider optho referral next visit  9. History of esophageal stricture To see GI in [facility] on visit next 3 weeks  10. Gait instability Consider PT referral next visit  11. Nausea Cont prn Zofran  Call for any questions/concerns re: medication changes  Return in about 4 weeks (around 04/29/2016).  [name]. [name] MD [facility]  04/01/2016

## 2016-04-27 ENCOUNTER — Other Ambulatory Visit: Payer: Self-pay | Admitting: Family Medicine

## 2016-05-02 ENCOUNTER — Encounter: Payer: Self-pay | Admitting: Family Medicine

## 2016-05-02 ENCOUNTER — Ambulatory Visit (INDEPENDENT_AMBULATORY_CARE_PROVIDER_SITE_OTHER): Payer: Medicare Other | Admitting: Family Medicine

## 2016-05-02 VITALS — BP 174/87 | HR 81 | Resp 16 | Ht 62.0 in | Wt 173.0 lb

## 2016-05-02 DIAGNOSIS — G2581 Restless legs syndrome: Secondary | ICD-10-CM | POA: Diagnosis not present

## 2016-05-02 DIAGNOSIS — R110 Nausea: Secondary | ICD-10-CM

## 2016-05-02 DIAGNOSIS — E1140 Type 2 diabetes mellitus with diabetic neuropathy, unspecified: Secondary | ICD-10-CM

## 2016-05-02 DIAGNOSIS — H3530 Unspecified macular degeneration: Secondary | ICD-10-CM | POA: Diagnosis not present

## 2016-05-02 DIAGNOSIS — R350 Frequency of micturition: Secondary | ICD-10-CM | POA: Diagnosis not present

## 2016-05-02 DIAGNOSIS — I10 Essential (primary) hypertension: Secondary | ICD-10-CM | POA: Diagnosis not present

## 2016-05-02 DIAGNOSIS — F418 Other specified anxiety disorders: Secondary | ICD-10-CM | POA: Diagnosis not present

## 2016-05-02 DIAGNOSIS — E559 Vitamin D deficiency, unspecified: Secondary | ICD-10-CM

## 2016-05-02 DIAGNOSIS — E669 Obesity, unspecified: Secondary | ICD-10-CM

## 2016-05-02 DIAGNOSIS — Z794 Long term (current) use of insulin: Secondary | ICD-10-CM | POA: Diagnosis not present

## 2016-05-02 DIAGNOSIS — E66811 Obesity, class 1: Secondary | ICD-10-CM

## 2016-05-02 DIAGNOSIS — Z8719 Personal history of other diseases of the digestive system: Secondary | ICD-10-CM

## 2016-05-02 LAB — POCT URINALYSIS DIPSTICK
BILIRUBIN UA: NEGATIVE
Blood, UA: NEGATIVE
Glucose, UA: NEGATIVE
Ketones, UA: NEGATIVE
LEUKOCYTES UA: NEGATIVE
Nitrite, UA: NEGATIVE
PH UA: 5
PROTEIN UA: NEGATIVE
SPEC GRAV UA: 1.01
Urobilinogen, UA: 0.2

## 2016-05-02 MED ORDER — LISINOPRIL 10 MG PO TABS: 10.0000 mg | ORAL_TABLET | Freq: Every day | ORAL | 2 refills | Status: DC

## 2016-05-02 MED ORDER — ROPINIROLE HCL 2 MG PO TABS: 1.0000 mg | ORAL_TABLET | Freq: Every day | ORAL | 2 refills | Status: DC

## 2016-05-02 NOTE — Patient Instructions (Signed)
Increase lisinopril to 10 mg daily. Increase Requip to 2 mg daily at bedtime.

## 2016-05-02 NOTE — Progress Notes (Signed)
Date:  05/02/2016   Name:  Judy Harvey   DOB:  [birth year]   MRN:  [id number]  PCP:  No PCP Per Patient    Chief Complaint: Diabetes (Can not see to check BS-May benefit from [facility] to go by and check medication issues and check BS levels. ) and Urinary Frequency (Pain in lower abdominal area and going alot. )   History of Present Illness:  This is a 83 y.o. female seen in one month f/u. Still taking HCTZ and Wellbutrin, c/o dry mouth and intermittent dizziness. Mood ok, still gets anxious about family's health. [place] thinks would stay more active there than [place]. Can't see to check BG's or ambulate outside, received eye injections for 10 yrs, optho said nothing more to do, declines referral to optho here. Denies hx CVA, not taking asa. RLS somewhat controlled on Requip but doesn't get through night. Couldn't tolerate gabapentin, caused body spasms just like Lyrica. Taking vit D supp. Constipation ok but having some intermittent lower abd pain past week. [place] and [place]ophagus [place], put on Prevacid x 30d with 3R (no records available). Asks which meds she can stop.  Review of Systems:  Review of Systems  Constitutional: Negative for chills and fever.  HENT: Negative for trouble swallowing.   Respiratory: Negative for cough and shortness of breath.   Cardiovascular: Negative for chest pain and leg swelling.  Endocrine: Negative for polydipsia and polyuria.  Genitourinary: Negative for vaginal bleeding.  Neurological: Negative for tremors and syncope.  Psychiatric/Behavioral: Negative for agitation and confusion.    Patient Active Problem List   Diagnosis Date Noted  . Obesity (BMI 30.0-34.9) 05/02/2016  . Vitamin D deficiency 03/25/2016  . Controlled type 2 diabetes mellitus with diabetic neuropathy (HCC) 03/24/2016  . Old cerebrovascular accident (CVA) without late effect 03/24/2016  . Hypertension 03/24/2016   . Legally blind 03/24/2016  . Macular degeneration 03/24/2016  . Constipation 03/24/2016  . History of esophageal stricture 03/24/2016  . Depression with anxiety 03/24/2016  . Restless leg syndrome 03/24/2016  . Postherpetic neuralgia 03/24/2016  . Gait instability 03/24/2016  . Nausea 03/24/2016    Prior to Admission medications   Medication Sig Start Date End Date Taking? Authorizing Provider  Cholecalciferol (VITAMIN D) 2000 units CAPS Take 1 capsule (2,000 Units total) by mouth daily. 03/25/16  Yes Schuyler AmorWilliam Brittiny Levitz, MD  DULoxetine (CYMBALTA) 30 MG capsule Take 1 capsule (30 mg total) by mouth daily. 04/01/16  Yes Schuyler AmorWilliam Blossie Raffel, MD  insulin lispro (HUMALOG) 100 UNIT/ML injection Inject 5 Units into the skin 3 (three) times daily before meals.   Yes Historical Provider, MD  LANTUS SOLOSTAR 100 UNIT/ML Solostar Pen INJECT 46 UNITS AT BEDTIME ONCE A DAY SUBCUTANEOUSLY 04/27/16  Yes Schuyler AmorWilliam Bona Hubbard, MD  lisinopril (PRINIVIL,ZESTRIL) 10 MG tablet Take 1 tablet (10 mg total) by mouth daily. 05/02/16  Yes Schuyler AmorWilliam Geanette Buonocore, MD  ondansetron (ZOFRAN) 4 MG tablet Take 4 mg by mouth every 8 (eight) hours as needed for nausea or vomiting.   Yes Historical Provider, MD  pantoprazole (PROTONIX) 40 MG tablet Take 40 mg by mouth daily.   Yes Historical Provider, MD  rOPINIRole (REQUIP) 2 MG tablet Take 0.5 tablets (1 mg total) by mouth at bedtime. 05/02/16  Yes Schuyler AmorWilliam Tira Lafferty, MD  Sennosides-Docusate Sodium (STOOL SOFTENER LAXATIVE PO) Take by mouth.   Yes Historical Provider, MD    Allergies  Allergen Reactions  . Gabapentin  Body spasms  . Lyrica [Pregabalin]     Body spasms    Past Surgical History:  Procedure Laterality Date  . ABDOMINAL HYSTERECTOMY     partial  . APPENDECTOMY    . BREAST SURGERY     reduction  . CESAREAN SECTION     x3    Social History  Substance Use Topics  . Smoking status: Former Games developer  . Smokeless tobacco: Never Used  . Alcohol use No    Family History   Problem Relation Age of Onset  . Family history unknown: Yes    Medication list has been reviewed and updated.  Physical Examination: BP (!) 174/87 (BP Location: Right Arm)   Pulse 81   Resp 16   Ht 5\' 2"  (1.575 m)   Wt 173 lb (78.5 kg)   SpO2 97%   BMI 31.64 kg/m   Physical Exam  Constitutional: She is oriented to person, place, and time. She appears well-developed and well-nourished.  Cardiovascular: Normal rate, regular rhythm and normal heart sounds.   Pulmonary/Chest: Effort normal and breath sounds normal.  Abdominal: Soft. She exhibits no distension. There is no tenderness.  Musculoskeletal: She exhibits no edema.  Neurological: She is alert and oriented to person, place, and time.  Skin: Skin is warm and dry.  Psychiatric: She has a normal mood and affect. Her behavior is normal.  Nursing note and vitals reviewed.   Assessment and Plan:  1. Controlled type 2 diabetes mellitus with diabetic neuropathy, with long-term current use of insulin (HCC) Adequate control with a1c 7.5% last month, cont Lantus/Humalog regimen  2. Essential hypertension Poor control, increase lisinopril to 10 mg daily, d/c HCTZ as likely contributing to dry mouth/dizziness  3. Restless leg syndrome Marginal control, increase Requip to 2 mg qhs  4. Macular degeneration Told [facility] no further rx available, declines referral to optho here  5. Depression with anxiety Well controlled on Cymbalta, d/c Wellbutrin  6. History of esophageal stricture Dilation per GI in [place] last week, cont Prevacid for now  7. Vitamin D deficiency On supplement, consider level next visit  8. Urine frequency UA negative, monitor - POCT urinalysis dipstick  9. Nausea Unclear etiology, cont Zofran prn  10. Obesity (BMI 30.0-34.9) Unable to exercise due to blindness, discussed benefits of exercise/activity which may improve with ALS placement  Return in about 4 weeks (around  05/30/2016).  [name]. [name] MD [facility]  05/02/2016

## 2016-05-04 ENCOUNTER — Telehealth: Payer: Self-pay

## 2016-05-04 ENCOUNTER — Other Ambulatory Visit: Payer: Self-pay | Admitting: Family Medicine

## 2016-05-04 MED ORDER — ROPINIROLE HCL 2 MG PO TABS: 2.0000 mg | ORAL_TABLET | Freq: Every day | ORAL | 2 refills | Status: DC

## 2016-05-04 NOTE — Telephone Encounter (Signed)
Called about medication instructions. Roprin. Rx saig take 0.5 of 2mg  tab and was increase from 1mg  but that is same dose. advised to take whole pill.

## 2016-05-30 ENCOUNTER — Ambulatory Visit (INDEPENDENT_AMBULATORY_CARE_PROVIDER_SITE_OTHER): Payer: Medicare Other | Admitting: Family Medicine

## 2016-05-30 ENCOUNTER — Encounter: Payer: Self-pay | Admitting: Family Medicine

## 2016-05-30 ENCOUNTER — Ambulatory Visit: Payer: Medicare Other | Admitting: Family Medicine

## 2016-05-30 VITALS — BP 136/82 | HR 86 | Resp 16 | Ht 62.0 in | Wt 176.0 lb

## 2016-05-30 DIAGNOSIS — I10 Essential (primary) hypertension: Secondary | ICD-10-CM

## 2016-05-30 DIAGNOSIS — E1140 Type 2 diabetes mellitus with diabetic neuropathy, unspecified: Secondary | ICD-10-CM

## 2016-05-30 DIAGNOSIS — G2581 Restless legs syndrome: Secondary | ICD-10-CM | POA: Diagnosis not present

## 2016-05-30 DIAGNOSIS — R110 Nausea: Secondary | ICD-10-CM

## 2016-05-30 DIAGNOSIS — R351 Nocturia: Secondary | ICD-10-CM | POA: Diagnosis not present

## 2016-05-30 DIAGNOSIS — E559 Vitamin D deficiency, unspecified: Secondary | ICD-10-CM | POA: Diagnosis not present

## 2016-05-30 DIAGNOSIS — F418 Other specified anxiety disorders: Secondary | ICD-10-CM | POA: Diagnosis not present

## 2016-05-30 DIAGNOSIS — E669 Obesity, unspecified: Secondary | ICD-10-CM | POA: Diagnosis not present

## 2016-05-30 DIAGNOSIS — Z794 Long term (current) use of insulin: Secondary | ICD-10-CM

## 2016-05-30 MED ORDER — DULOXETINE HCL 30 MG PO CPEP: 30.0000 mg | ORAL_CAPSULE | Freq: Every day | ORAL | 3 refills | Status: DC

## 2016-05-30 MED ORDER — ONDANSETRON HCL 8 MG PO TABS: 4.0000 mg | ORAL_TABLET | Freq: Three times a day (TID) | ORAL | 3 refills | Status: DC | PRN

## 2016-05-30 NOTE — Progress Notes (Signed)
Date:  05/30/2016   Name:  Judy Harvey   DOB:  1928-02-02   MRN:  [id number]  PCP:  Schuyler Amor, MD    Chief Complaint: Diabetes (Can not see to check BS and has no machine. ) and Nausea (Feels sick when she gets up and very hungry but nausea medicine helps wants refill 8 mg Zofran )   History of Present Illness:  This is a 83 y.o. female seen in one month f/u. T2DM well controlled on current regimen with a1c 7.5% in Jan. Lisinopril increased and HCTZ stopped last visit, dry mouth improved. Requip dose increased for RLS, sxs about the same, having more nausea in AM and having to take odansetron daily, needs refill. Depression sxs well controlled on Cymbalta off Wellbutrin, GI sxs stable on Prevacid. Taking vit D supplement daily. C/o nocturia x 3-4, that and RLS make it hard to sleep.  Review of Systems:  Review of Systems  Constitutional: Negative for chills, fever and unexpected weight change.  Respiratory: Negative for cough and shortness of breath.   Cardiovascular: Negative for chest pain and leg swelling.  Gastrointestinal: Negative for abdominal pain.  Endocrine: Negative for polydipsia and polyuria.  Genitourinary: Negative for dysuria.  Neurological: Negative for tremors, syncope and light-headedness.    Patient Active Problem List   Diagnosis Date Noted  . Obesity (BMI 30.0-34.9) 05/02/2016  . Vitamin D deficiency 03/25/2016  . Controlled type 2 diabetes mellitus with diabetic neuropathy (HCC) 03/24/2016  . Old cerebrovascular accident (CVA) without late effect 03/24/2016  . Hypertension 03/24/2016  . Legally blind 03/24/2016  . Macular degeneration 03/24/2016  . Constipation 03/24/2016  . History of esophageal stricture 03/24/2016  . Depression with anxiety 03/24/2016  . Restless leg syndrome 03/24/2016  . Postherpetic neuralgia 03/24/2016  . Gait instability 03/24/2016  . Nausea 03/24/2016    Prior to Admission medications   Medication Sig Start Date End  Date Taking? Authorizing Provider  Cholecalciferol (VITAMIN D) 2000 units CAPS Take 1 capsule (2,000 Units total) by mouth daily. 03/25/16  Yes Schuyler Amor, MD  DULoxetine (CYMBALTA) 30 MG capsule Take 1 capsule (30 mg total) by mouth daily. 05/30/16  Yes Schuyler Amor, MD  insulin lispro (HUMALOG) 100 UNIT/ML injection Inject 5 Units into the skin 3 (three) times daily before meals.   Yes Historical Provider, MD  LANTUS SOLOSTAR 100 UNIT/ML Solostar Pen INJECT 46 UNITS AT BEDTIME ONCE A DAY SUBCUTANEOUSLY 04/27/16  Yes Schuyler Amor, MD  lisinopril (PRINIVIL,ZESTRIL) 10 MG tablet Take 1 tablet (10 mg total) by mouth daily. 05/02/16  Yes Schuyler Amor, MD  ondansetron (ZOFRAN) 8 MG tablet Take 0.5 tablets (4 mg total) by mouth every 8 (eight) hours as needed for nausea. 05/30/16  Yes Schuyler Amor, MD  pantoprazole (PROTONIX) 40 MG tablet Take 40 mg by mouth daily.   Yes Historical Provider, MD  rOPINIRole (REQUIP) 2 MG tablet Take 1 tablet (2 mg total) by mouth at bedtime. 05/04/16  Yes Schuyler Amor, MD  Sennosides-Docusate Sodium (STOOL SOFTENER LAXATIVE PO) Take by mouth.   Yes Historical Provider, MD    Allergies  Allergen Reactions  . Gabapentin     Body spasms  . Lyrica [Pregabalin]     Body spasms    Past Surgical History:  Procedure Laterality Date  . ABDOMINAL HYSTERECTOMY     partial  . APPENDECTOMY    . BREAST SURGERY     reduction  . CESAREAN SECTION     x3  Social History  Substance Use Topics  . Smoking status: Former Games developermoker  . Smokeless tobacco: Never Used  . Alcohol use No    Family History  Problem Relation Age of Onset  . Family history unknown: Yes    Medication list has been reviewed and updated.  Physical Examination: BP 136/82   Pulse 86   Resp 16   Ht 5\' 2"  (1.575 m)   Wt 176 lb (79.8 kg)   SpO2 96%   BMI 32.19 kg/m   Physical Exam  Constitutional: She appears well-developed and well-nourished.  Cardiovascular: Normal rate, regular rhythm  and normal heart sounds.   Pulmonary/Chest: Effort normal and breath sounds normal.  Musculoskeletal: She exhibits no edema.  Neurological: She is alert.  Skin: Skin is warm and dry.  Psychiatric: She has a normal mood and affect. Her behavior is normal.  Nursing note and vitals reviewed.   Assessment and Plan:  1. Controlled type 2 diabetes mellitus with diabetic neuropathy, with long-term current use of insulin (HCC) Well controlled, cont current Lantus/Humalog regimen, repeat a1c next visit  2. Essential hypertension Improved control on increased lisinopril, dry mouth improved off HCTZ  3. Depression with anxiety Well controlled on Cymbalta, off Wellbutrin  4. Restless leg syndrome Marginal control on increased Requip, may be contributing to AM nausea  5. Nausea Unclear etiology, trial off Requip (intolerant gabapentin), refill odansetron, consider change to Requip XR  6. Vitamin D deficiency On supplement, repeat level next visit  7. Nocturia Unclear etiology, avoid anticholinergics for now  8. Obesity (BMI 30.0-34.9) Exercise/weight loss discussed  Return in about 4 weeks (around 06/27/2016).  Dionne AnoWilliam M. Kingsley SpittlePlonk, Jr. MD [facility]  05/30/2016

## 2016-05-30 NOTE — Patient Instructions (Signed)
Try stopping Requip for few days to see if helps morning nausea. May restart if nausea unimproved.

## 2016-06-08 ENCOUNTER — Encounter: Payer: Self-pay | Admitting: Family Medicine

## 2016-06-27 ENCOUNTER — Encounter: Payer: Self-pay | Admitting: Family Medicine

## 2016-06-27 ENCOUNTER — Ambulatory Visit (INDEPENDENT_AMBULATORY_CARE_PROVIDER_SITE_OTHER): Payer: Medicare Other | Admitting: Family Medicine

## 2016-06-27 ENCOUNTER — Telehealth: Payer: Self-pay

## 2016-06-27 VITALS — BP 118/72 | HR 82 | Resp 16 | Ht 62.0 in | Wt 176.2 lb

## 2016-06-27 DIAGNOSIS — G2581 Restless legs syndrome: Secondary | ICD-10-CM

## 2016-06-27 DIAGNOSIS — E559 Vitamin D deficiency, unspecified: Secondary | ICD-10-CM | POA: Diagnosis not present

## 2016-06-27 DIAGNOSIS — K5901 Slow transit constipation: Secondary | ICD-10-CM

## 2016-06-27 DIAGNOSIS — F418 Other specified anxiety disorders: Secondary | ICD-10-CM

## 2016-06-27 DIAGNOSIS — E1140 Type 2 diabetes mellitus with diabetic neuropathy, unspecified: Secondary | ICD-10-CM | POA: Diagnosis not present

## 2016-06-27 DIAGNOSIS — R110 Nausea: Secondary | ICD-10-CM | POA: Diagnosis not present

## 2016-06-27 DIAGNOSIS — I10 Essential (primary) hypertension: Secondary | ICD-10-CM | POA: Diagnosis not present

## 2016-06-27 DIAGNOSIS — Z794 Long term (current) use of insulin: Secondary | ICD-10-CM

## 2016-06-27 DIAGNOSIS — E669 Obesity, unspecified: Secondary | ICD-10-CM | POA: Diagnosis not present

## 2016-06-27 DIAGNOSIS — E66811 Obesity, class 1: Secondary | ICD-10-CM

## 2016-06-27 MED ORDER — GABAPENTIN 100 MG PO CAPS: 100.0000 mg | ORAL_CAPSULE | Freq: Every day | ORAL | 3 refills | Status: DC

## 2016-06-27 MED ORDER — INSULIN GLARGINE 100 UNIT/ML SOLOSTAR PEN: PEN_INJECTOR | SUBCUTANEOUS | 3 refills | Status: DC

## 2016-06-27 MED ORDER — GABAPENTIN 300 MG PO CAPS: 300.0000 mg | ORAL_CAPSULE | Freq: Every day | ORAL | 3 refills | Status: DC

## 2016-06-27 MED ORDER — SENNOSIDES-DOCUSATE SODIUM 8.6-50 MG PO TABS: 1.0000 | ORAL_TABLET | Freq: Every day | ORAL | Status: DC

## 2016-06-27 NOTE — Telephone Encounter (Signed)
Son called about Gabapentin. I entered it as 100 mg and it was really 300 mg once at night. I told him I will fix in chart and have you send in new Rx. They agree to take 3 of the 100 mg until gone then pick up the 300 mg RX. Changes already made to med list just send refill for correct 300 mg.

## 2016-06-27 NOTE — Progress Notes (Signed)
Date:  06/27/2016   Name:  Judy Harvey   DOB:  January 29, 1928   MRN:  [id number]  PCP:  Schuyler Amor, Harvey    Chief Complaint: Diabetes (refill Lantus and gabapentin ) and Constipation (Taking laxative to help move things but very constipated. )   History of Present Illness:  This is a 83 y.o. female seen in one month f/u. States sugars ok but eating too much starch. Gabapentin helping RLS, off Requip, less nausea.Marland Kitchen HTN on lisinopril, depression ok on Cymbalta, taking vit D supp. C/o constipation, BM only q 3-4 days, stool softener not helping.  Review of Systems:  Review of Systems  Constitutional: Negative for chills and fever.  Respiratory: Negative for cough and shortness of breath.   Cardiovascular: Negative for chest pain and leg swelling.  Gastrointestinal: Negative for blood in stool.  Endocrine: Negative for polydipsia and polyuria.  Neurological: Negative for syncope and light-headedness.    Patient Active Problem List   Diagnosis Date Noted  . Obesity (BMI 30.0-34.9) 05/02/2016  . Vitamin D deficiency 03/25/2016  . Controlled type 2 diabetes mellitus with diabetic neuropathy (HCC) 03/24/2016  . Old cerebrovascular accident (CVA) without late effect 03/24/2016  . Hypertension 03/24/2016  . Legally blind 03/24/2016  . Macular degeneration 03/24/2016  . Constipation 03/24/2016  . History of esophageal stricture 03/24/2016  . Depression with anxiety 03/24/2016  . Restless leg syndrome 03/24/2016  . Postherpetic neuralgia 03/24/2016  . Gait abnormality 03/24/2016  . Nausea 03/24/2016    Prior to Admission medications   Medication Sig Start Date End Date Taking? Authorizing Provider  Cholecalciferol (VITAMIN D) 2000 units CAPS Take 1 capsule (2,000 Units total) by mouth daily. 03/25/16  Yes Schuyler Amor, Harvey  DULoxetine (CYMBALTA) 30 MG capsule Take 1 capsule (30 mg total) by mouth daily. 05/30/16  Yes Schuyler Amor, Harvey  gabapentin (NEURONTIN) 300 MG capsule Take 1  capsule (300 mg total) by mouth at bedtime. 06/27/16  Yes Schuyler Amor, Harvey  Insulin Glargine (LANTUS SOLOSTAR) 100 UNIT/ML Solostar Pen INJECT 46 UNITS AT BEDTIME ONCE A DAY SUBCUTANEOUSLY 06/27/16  Yes Schuyler Amor, Harvey  insulin lispro (HUMALOG) 100 UNIT/ML injection Inject 5 Units into the skin 3 (three) times daily before meals.   Yes Historical Provider, Harvey  lisinopril (PRINIVIL,ZESTRIL) 10 MG tablet Take 1 tablet (10 mg total) by mouth daily. 05/02/16  Yes Schuyler Amor, Harvey  ondansetron (ZOFRAN) 8 MG tablet Take 0.5 tablets (4 mg total) by mouth every 8 (eight) hours as needed for nausea. 05/30/16  Yes Schuyler Amor, Harvey  pantoprazole (PROTONIX) 40 MG tablet Take 40 mg by mouth daily.   Yes Historical Provider, Harvey  senna-docusate (SENOKOT S) 8.6-50 MG tablet Take 1 tablet by mouth daily. 06/27/16   Schuyler Amor, Harvey    Allergies  Allergen Reactions  . Lyrica [Pregabalin]     Body spasms    Past Surgical History:  Procedure Laterality Date  . ABDOMINAL HYSTERECTOMY     partial  . APPENDECTOMY    . BREAST SURGERY     reduction  . CESAREAN SECTION     x3    Social History  Substance Use Topics  . Smoking status: Former Games developer  . Smokeless tobacco: Never Used  . Alcohol use No    Family History  Problem Relation Age of Onset  . Family history unknown: Yes    Medication list has been reviewed and updated.  Physical Examination: BP 118/72   Pulse 82   Resp 16  Ht  (1.575 m)   Wt 176 lb 3.2 oz (79.9 kg)   SpO2 95%   BMI 32.23 kg/m   Physical Exam  Constitutional: She appears well-developed and well-nourished.  Cardiovascular: Normal rate, regular rhythm and normal heart sounds.   Pulmonary/Chest: Effort normal and breath sounds normal.  Musculoskeletal: She exhibits no edema.  Neurological: She is alert.  Skin: Skin is warm and dry.  Psychiatric: She has a normal mood and affect. Her behavior is normal.  Nursing note and vitals reviewed.   Assessment and  Plan:  1. Controlled type 2 diabetes mellitus with diabetic neuropathy, with long-term current use of insulin (HCC) Well contolled, last a1c 7.5% in Jan - HgB A1c - Urine Microalbumin w/creat. ratio  2. Essential hypertension Well controlled on lisinopril  3. Slow transit constipation May have some degree of autonomic dysfunction, trial Senokot daily  4. Depression with anxiety Well controlled on Cymbalta  5. Restless leg syndrome Improved control on gabapentin  6. Nausea Improved off Requip  7. Vitamin D deficiency On supplement - Vitamin D (25 hydroxy)  8. Obesity (BMI 30.0-34.9) Exercise/weight loss discussed  Return in about 3 months (around 09/26/2016).  Judy Harvey. Judy Harvey [facility]  06/28/2016

## 2016-06-27 NOTE — Telephone Encounter (Signed)
Done

## 2016-06-27 NOTE — Patient Instructions (Signed)
Take Senokot daily for constipation.

## 2016-06-28 ENCOUNTER — Other Ambulatory Visit: Payer: Self-pay | Admitting: Family Medicine

## 2016-06-28 ENCOUNTER — Other Ambulatory Visit: Payer: Self-pay

## 2016-06-28 LAB — MICROALBUMIN / CREATININE URINE RATIO

## 2016-06-28 LAB — VITAMIN D 25 HYDROXY (VIT D DEFICIENCY, FRACTURES): Vit D, 25-Hydroxy: 20.8 ng/mL — ABNORMAL LOW (ref 30.0–100.0)

## 2016-06-28 LAB — HEMOGLOBIN A1C
ESTIMATED AVERAGE GLUCOSE: 157 mg/dL
HEMOGLOBIN A1C: 7.1 % — AB (ref 4.8–5.6)

## 2016-06-28 MED ORDER — INSULIN PEN NEEDLE 31G X 5 MM MISC: 5 refills | Status: DC

## 2016-06-28 MED ORDER — VITAMIN D3 125 MCG (5000 UT) PO CAPS: 1.0000 | ORAL_CAPSULE | Freq: Every day | ORAL | Status: AC

## 2016-06-28 NOTE — Progress Notes (Signed)
Bd

## 2016-08-12 ENCOUNTER — Telehealth: Payer: Self-pay

## 2016-08-12 NOTE — Telephone Encounter (Signed)
Patient LM asking to [facility].

## 2016-08-12 NOTE — Telephone Encounter (Signed)
Called patient and discussed her hand swelling. She has redness and it is worse at the end of the day. Denies injury and denies numbness. She has ROM and is able to use her hand. She said there is some pain and some tingling and was told by friend that it could be neuropathy though I told her I do not agree but no one can tell with out seeing her hand. Advised Dr.Plonk is on vacation and advised MUC or ER and she declined. She states this has gone on over a week and she feels fine to make it until Wed so I scheduled her for Wed August 17 2016 when PCP returns. Went over reasons to go to ER before this date and time.

## 2016-08-12 NOTE — Telephone Encounter (Signed)
Called patient back and no answer so I left message to call back that Dr.Plonk was on vacation so I will not be in office long but call me this morning so I can assist in what is needed.

## 2016-08-17 ENCOUNTER — Ambulatory Visit: Payer: Medicare Other | Admitting: Family Medicine

## 2016-09-09 ENCOUNTER — Telehealth: Payer: Self-pay

## 2016-09-16 ENCOUNTER — Other Ambulatory Visit: Payer: Self-pay | Admitting: Family Medicine

## 2016-09-16 MED ORDER — INSULIN LISPRO 100 UNIT/ML ~~LOC~~ SOLN: 5.0000 [IU] | Freq: Three times a day (TID) | SUBCUTANEOUS | 5 refills | Status: DC

## 2016-09-19 ENCOUNTER — Other Ambulatory Visit: Payer: Self-pay | Admitting: Family Medicine

## 2016-09-19 MED ORDER — INSULIN LISPRO 100 UNIT/ML ~~LOC~~ SOLN
5.0000 [IU] | Freq: Three times a day (TID) | SUBCUTANEOUS | 5 refills | Status: DC
Start: 2016-09-19 — End: 2016-09-26

## 2016-09-26 ENCOUNTER — Encounter: Payer: Self-pay | Admitting: Family Medicine

## 2016-09-26 ENCOUNTER — Ambulatory Visit
Admission: RE | Admit: 2016-09-26 | Discharge: 2016-09-26 | Disposition: A | Discharge status: Home / Self Care | Payer: Medicare Other | Source: Ambulatory Visit | Attending: Family Medicine | Admitting: Family Medicine

## 2016-09-26 ENCOUNTER — Ambulatory Visit (INDEPENDENT_AMBULATORY_CARE_PROVIDER_SITE_OTHER): Payer: Medicare Other | Admitting: Family Medicine

## 2016-09-26 VITALS — BP 140/88 | HR 84 | Resp 16 | Ht 61.0 in | Wt 179.0 lb

## 2016-09-26 DIAGNOSIS — Z794 Long term (current) use of insulin: Secondary | ICD-10-CM

## 2016-09-26 DIAGNOSIS — E559 Vitamin D deficiency, unspecified: Secondary | ICD-10-CM

## 2016-09-26 DIAGNOSIS — M79641 Pain in right hand: Secondary | ICD-10-CM

## 2016-09-26 DIAGNOSIS — K5901 Slow transit constipation: Secondary | ICD-10-CM

## 2016-09-26 DIAGNOSIS — E1140 Type 2 diabetes mellitus with diabetic neuropathy, unspecified: Secondary | ICD-10-CM

## 2016-09-26 DIAGNOSIS — F418 Other specified anxiety disorders: Secondary | ICD-10-CM | POA: Diagnosis not present

## 2016-09-26 DIAGNOSIS — I10 Essential (primary) hypertension: Secondary | ICD-10-CM | POA: Diagnosis not present

## 2016-09-26 DIAGNOSIS — E669 Obesity, unspecified: Secondary | ICD-10-CM

## 2016-09-26 DIAGNOSIS — M19041 Primary osteoarthritis, right hand: Secondary | ICD-10-CM | POA: Insufficient documentation

## 2016-09-26 DIAGNOSIS — G2581 Restless legs syndrome: Secondary | ICD-10-CM

## 2016-09-26 MED ORDER — ACETAMINOPHEN 500 MG PO TABS: 1000.0000 mg | ORAL_TABLET | Freq: Two times a day (BID) | ORAL | 0 refills | Status: DC

## 2016-09-26 MED ORDER — METFORMIN HCL 1000 MG PO TABS: 1000.0000 mg | ORAL_TABLET | Freq: Two times a day (BID) | ORAL | 2 refills | Status: DC

## 2016-09-26 NOTE — Patient Instructions (Addendum)
Stop pantoprazole and Humalog. Begin metformin and Tylenol twice daily.

## 2016-09-27 ENCOUNTER — Telehealth: Payer: Self-pay

## 2016-09-27 ENCOUNTER — Ambulatory Visit: Payer: Medicare Other | Admitting: Family Medicine

## 2016-09-27 NOTE — Progress Notes (Signed)
Date:  09/26/2016   Name:  Judy MorgansMildred J Harvey   DOB:  1927/09/05   MRN:  [id number]  PCP:  Schuyler AmorPlonk, Aquinnah Devin, MD    Chief Complaint: Diabetes (Needs the insulin pen she can not see to draw up insulin. ) and Hand Pain (Right hand swelling and pain x 6-8 weeks. Constant pain. )   History of Present Illness:  This is a 83 y.o. female seen for three month f/u. T2DM well controlled on Lantus/Humalog but pt aks about stopping Humalog and restarting metformin. Didn't take lisinopril this am. Constipation still a problem, only having BM q2-6d. Depression sxs stable on Cymbalta, gabapentin helping sleep. C/o increased R hand swelling/pain past week, no injury, Tylenol helps some, no hx gout. Taking increased vit D supp. Doesn't think she needs Protonix any more.  Review of Systems:  Review of Systems  Constitutional: Negative for chills and fever.  Respiratory: Negative for cough and shortness of breath.   Cardiovascular: Negative for chest pain and leg swelling.  Endocrine: Negative for polydipsia and polyuria.  Genitourinary: Negative for difficulty urinating.  Neurological: Negative for syncope and light-headedness.    Patient Active Problem List   Diagnosis Date Noted  . Obesity (BMI 30.0-34.9) 05/02/2016  . Vitamin D deficiency 03/25/2016  . Controlled type 2 diabetes mellitus with diabetic neuropathy (HCC) 03/24/2016  . Old cerebrovascular accident (CVA) without late effect 03/24/2016  . Hypertension 03/24/2016  . Legally blind 03/24/2016  . Macular degeneration 03/24/2016  . Constipation 03/24/2016  . History of esophageal stricture 03/24/2016  . Depression with anxiety 03/24/2016  . Restless leg syndrome 03/24/2016  . Postherpetic neuralgia 03/24/2016  . Gait abnormality 03/24/2016  . Nausea 03/24/2016    Prior to Admission medications   Medication Sig Start Date End Date Taking? Authorizing Provider  Cholecalciferol (VITAMIN D3) 5000 units CAPS Take 1 capsule (5,000 Units total)  by mouth daily. 06/28/16  Yes Garvin Ellena, Chrissie NoaWilliam, MD  DULoxetine (CYMBALTA) 30 MG capsule Take 1 capsule (30 mg total) by mouth daily. 05/30/16  Yes Braydee Shimkus, Chrissie NoaWilliam, MD  gabapentin (NEURONTIN) 300 MG capsule Take 1 capsule (300 mg total) by mouth at bedtime. 06/27/16  Yes Kebrina Friend, Chrissie NoaWilliam, MD  Insulin Glargine (LANTUS SOLOSTAR) 100 UNIT/ML Solostar Pen INJECT 46 UNITS AT BEDTIME ONCE A DAY SUBCUTANEOUSLY 06/27/16  Yes Chisum Habenicht, Chrissie NoaWilliam, MD  lisinopril (PRINIVIL,ZESTRIL) 10 MG tablet Take 1 tablet (10 mg total) by mouth daily. 05/02/16  Yes Cathryn Gallery, Chrissie NoaWilliam, MD  ondansetron (ZOFRAN) 8 MG tablet Take 0.5 tablets (4 mg total) by mouth every 8 (eight) hours as needed for nausea. 05/30/16  Yes Torryn Fiske, Chrissie NoaWilliam, MD  senna-docusate (SENOKOT S) 8.6-50 MG tablet Take 1 tablet by mouth daily. 06/27/16  Yes Nilza Eaker, Chrissie NoaWilliam, MD  acetaminophen (TYLENOL) 500 MG tablet Take 2 tablets (1,000 mg total) by mouth 2 (two) times daily. 09/26/16   Ceonna Frazzini, Chrissie NoaWilliam, MD  Insulin Pen Needle 31G X 5 MM MISC As directed Patient not taking: Reported on 09/26/2016 06/28/16   Schuyler AmorPlonk, Ruddy Swire, MD  metFORMIN (GLUCOPHAGE) 1000 MG tablet Take 1 tablet (1,000 mg total) by mouth 2 (two) times daily with a meal. 09/26/16   Schuyler AmorPlonk, Wymon Swaney, MD    Allergies  Allergen Reactions  . Lyrica [Pregabalin]     Body spasms    Past Surgical History:  Procedure Laterality Date  . ABDOMINAL HYSTERECTOMY     partial  . APPENDECTOMY    . BREAST SURGERY     reduction  . CESAREAN SECTION     x3  Social History  Substance Use Topics  . Smoking status: Former Games developer  . Smokeless tobacco: Never Used  . Alcohol use No    Family History  Problem Relation Age of Onset  . Family history unknown: Yes    Medication list has been reviewed and updated.  Physical Examination: BP 140/88   Pulse 84   Resp 16   Ht 5\' 1"  (1.549 m)   Wt 179 lb (81.2 kg)   SpO2 95%   BMI 33.82 kg/m   Physical Exam  Constitutional: She appears well-developed and well-nourished.   Cardiovascular: Normal rate, regular rhythm and normal heart sounds.   Pulmonary/Chest: Effort normal and breath sounds normal.  Musculoskeletal: She exhibits no edema.  PIP R 3rd digit mildly enlarged/tender, no erythema  Neurological: She is alert.  Skin: Skin is warm and dry.  Psychiatric: She has a normal mood and affect. Her behavior is normal.  Nursing note and vitals reviewed.   Assessment and Plan:  1. Controlled type 2 diabetes mellitus with diabetic neuropathy, with long-term current use of insulin (HCC) Cont Lantus, stop Humalog, restart metformin - HgB A1c - Urine Microalbumin w/creat. ratio  2. Right hand pain XR shows OA, trial Tylenol bid - DG Hand Complete Right; Future  3. Essential hypertension Marginal control today, did not take lisinopril this am  4. Slow transit constipation Cont Senokot prn, metformin may help  5. Vitamin D deficiency On increased supplement - Vitamin D (25 hydroxy)  6. Obesity (BMI 30.0-34.9) Weight up 3#, exercise/weight loss discussed  7. Restless leg syndrome Improved on gabapentin  8. Depression with anxiety Stable on Cymbalta  9. Med review Stop Protonix  Return in about 3 months (around 12/27/2016).  [name]. [name] MD [facility]  09/27/2016

## 2016-09-27 NOTE — Telephone Encounter (Signed)
See note

## 2016-09-27 NOTE — Telephone Encounter (Signed)
[facility] woman named Arline AspCindy called stating transfer tube was sent over from our office for patient. Unsure of what test is needed to be done for this patient. Please contact her at [phone]- Option 1- EXT: [phone].   Please Advise. She left VM.

## 2016-09-28 LAB — SPECIMEN STATUS REPORT

## 2016-09-28 NOTE — Telephone Encounter (Signed)
No tubes sent from here. Left message with Arline AspCindy.

## 2016-09-29 LAB — MICROALBUMIN / CREATININE URINE RATIO
CREATININE, UR: 153.6 mg/dL
Microalb/Creat Ratio: 18.4 mg/g creat (ref 0.0–30.0)
Microalbumin, Urine: 28.2 ug/mL

## 2016-09-29 LAB — HEMOGLOBIN A1C
Est. average glucose Bld gHb Est-mCnc: 194 mg/dL
Hgb A1c MFr Bld: 8.4 % — ABNORMAL HIGH (ref 4.8–5.6)

## 2016-09-29 LAB — VITAMIN D 25 HYDROXY (VIT D DEFICIENCY, FRACTURES): VIT D 25 HYDROXY: 25.1 ng/mL — AB (ref 30.0–100.0)

## 2016-10-03 ENCOUNTER — Other Ambulatory Visit: Payer: Self-pay | Admitting: Family Medicine

## 2016-10-04 NOTE — Telephone Encounter (Signed)
error 

## 2016-10-12 ENCOUNTER — Telehealth: Payer: Self-pay

## 2016-10-12 NOTE — Telephone Encounter (Signed)
Left message with Loraine LericheMark. I need to speak to him about patients insurance and Rx coverage. Also have a sample of Lantus to give that can help for 6 days if this helps them.

## 2016-10-12 NOTE — Telephone Encounter (Signed)
Patient wants Metformin to take all day instead of insulin. Lantus can not be filled for lapse in insurance,.

## 2016-10-12 NOTE — Telephone Encounter (Signed)
Insulin requirements are too high to stop insulin and use metformin alone. Think metformin and Lantus combination is her best option. Can we do anything about getting her Lantus covered?

## 2016-10-13 ENCOUNTER — Other Ambulatory Visit: Payer: Self-pay | Admitting: Family Medicine

## 2016-10-13 MED ORDER — INSULIN GLARGINE 100 UNIT/ML ~~LOC~~ SOLN: 50.0000 [IU] | Freq: Every day | SUBCUTANEOUS | 11 refills | Status: DC

## 2016-10-13 NOTE — Telephone Encounter (Signed)
Spoke to son. The supplement insurance lapsed and he said it is very long story. He is working on this. He wants to pick up the pen and have the draw up insulin called in. I went over how serious it is that someone else handle her meds. He said she is going out of town but will discuss with the friend who goes as to how they can administer meds. Please send in needles and insulin to pharmacy.

## 2016-10-13 NOTE — Telephone Encounter (Signed)
Lantus vial rx sent. Please call pharmacy to see which insulin syringes with needles they stock and call in #[phone] with 3R.

## 2016-10-14 ENCOUNTER — Other Ambulatory Visit: Payer: Self-pay

## 2016-10-17 MED ORDER — "INSULIN SYRINGE-NEEDLE U-100 31G X 5/16"" 0.3 ML MISC": 3 refills | Status: DC

## 2016-10-17 NOTE — Telephone Encounter (Signed)
Thanks

## 2016-10-17 NOTE — Telephone Encounter (Signed)
I sent correct needles.

## 2016-11-16 ENCOUNTER — Telehealth: Payer: Self-pay

## 2016-11-16 NOTE — Telephone Encounter (Signed)
Insurance effective now but will not cover Vial of insulin must be the pen only. Can we send Lantus Pen for night time to [facility] with needles.

## 2016-11-16 NOTE — Telephone Encounter (Signed)
Yes

## 2016-11-17 ENCOUNTER — Other Ambulatory Visit: Payer: Self-pay

## 2016-11-17 MED ORDER — INSULIN PEN NEEDLE 31G X 5 MM MISC: 3 refills | Status: DC

## 2016-11-17 MED ORDER — INSULIN GLARGINE 100 UNIT/ML SOLOSTAR PEN: 46.0000 [IU] | PEN_INJECTOR | Freq: Every day | SUBCUTANEOUS | 3 refills | Status: DC

## 2016-11-21 ENCOUNTER — Telehealth: Payer: Self-pay

## 2016-11-21 NOTE — Telephone Encounter (Signed)
Called for Pantoprazole for refill but not on the med list. [facility]

## 2016-11-22 NOTE — Telephone Encounter (Signed)
We stopped Protonix at her last visit.

## 2016-12-07 ENCOUNTER — Telehealth: Payer: Self-pay

## 2016-12-07 MED ORDER — INSULIN GLARGINE 100 UNIT/ML SOLOSTAR PEN: 46.0000 [IU] | PEN_INJECTOR | Freq: Every day | SUBCUTANEOUS | 3 refills | Status: DC

## 2016-12-07 MED ORDER — INSULIN PEN NEEDLE 31G X 5 MM MISC: 3 refills | Status: DC

## 2016-12-07 NOTE — Telephone Encounter (Signed)
sent in another refill for insulin pen.

## 2016-12-15 ENCOUNTER — Other Ambulatory Visit: Payer: Self-pay | Admitting: Family Medicine

## 2016-12-15 ENCOUNTER — Telehealth: Payer: Self-pay

## 2016-12-15 MED ORDER — METFORMIN HCL ER 500 MG PO TB24: 1000.0000 mg | ORAL_TABLET | Freq: Every day | ORAL | 2 refills | Status: DC

## 2016-12-15 NOTE — Telephone Encounter (Signed)
Can try long acting metformin which causes less nausea (rx sent), if nausea persists can restart Humalog.

## 2016-12-15 NOTE — Telephone Encounter (Signed)
Patient said Metformin causing nausea. Stopped it two days ago. Wants to go back on Humalog or Novalog. Send to [facility] and no need to call unless this is an issue we can not do.

## 2016-12-21 ENCOUNTER — Other Ambulatory Visit: Payer: Self-pay | Admitting: Family Medicine

## 2016-12-27 ENCOUNTER — Ambulatory Visit: Payer: Medicare Other | Admitting: Family Medicine

## 2016-12-28 ENCOUNTER — Ambulatory Visit (INDEPENDENT_AMBULATORY_CARE_PROVIDER_SITE_OTHER): Payer: Medicare Other | Admitting: Family Medicine

## 2016-12-28 VITALS — BP 130/84 | HR 80 | Ht 61.0 in | Wt 173.0 lb

## 2016-12-28 DIAGNOSIS — F418 Other specified anxiety disorders: Secondary | ICD-10-CM

## 2016-12-28 DIAGNOSIS — I10 Essential (primary) hypertension: Secondary | ICD-10-CM

## 2016-12-28 DIAGNOSIS — Z23 Encounter for immunization: Secondary | ICD-10-CM

## 2016-12-28 DIAGNOSIS — H3530 Unspecified macular degeneration: Secondary | ICD-10-CM

## 2016-12-28 DIAGNOSIS — E559 Vitamin D deficiency, unspecified: Secondary | ICD-10-CM | POA: Diagnosis not present

## 2016-12-28 DIAGNOSIS — E1140 Type 2 diabetes mellitus with diabetic neuropathy, unspecified: Secondary | ICD-10-CM

## 2016-12-28 DIAGNOSIS — Z794 Long term (current) use of insulin: Secondary | ICD-10-CM

## 2016-12-28 DIAGNOSIS — E669 Obesity, unspecified: Secondary | ICD-10-CM

## 2016-12-28 MED ORDER — INSULIN GLARGINE 100 UNIT/ML SOLOSTAR PEN: 50.0000 [IU] | PEN_INJECTOR | Freq: Every day | SUBCUTANEOUS | 3 refills | Status: DC

## 2016-12-28 MED ORDER — SENNOSIDES-DOCUSATE SODIUM 8.6-50 MG PO TABS
1.0000 | ORAL_TABLET | Freq: Every day | ORAL | Status: DC | PRN
Start: 2016-12-28 — End: 2017-12-18

## 2016-12-28 MED ORDER — GABAPENTIN 300 MG PO CAPS: 300.0000 mg | ORAL_CAPSULE | Freq: Two times a day (BID) | ORAL | 2 refills | Status: DC

## 2016-12-29 ENCOUNTER — Encounter: Payer: Self-pay | Admitting: Family Medicine

## 2016-12-29 LAB — VITAMIN D 25 HYDROXY (VIT D DEFICIENCY, FRACTURES): Vit D, 25-Hydroxy: 36.5 ng/mL (ref 30.0–100.0)

## 2016-12-29 LAB — HEMOGLOBIN A1C
ESTIMATED AVERAGE GLUCOSE: 134 mg/dL
HEMOGLOBIN A1C: 6.3 % — AB (ref 4.8–5.6)

## 2016-12-29 NOTE — Progress Notes (Signed)
Date:  12/28/2016   Name:  Judy Harvey   DOB:  1927/06/12   MRN:  [id number]  PCP:  Schuyler Amor, MD    Chief Complaint: Diabetes (Patient unsure of what insulin she is currently using. States insurance would not pay for Lantus. ); Hypertension; and Immunizations (high dose flu vaccine)   History of Present Illness:  This is a 83 y.o. female seen for 3 month f/u. T2DM on Lantus and metformin, having trouble getting Lantus covered, son is drawing up syringes. Nausea much improved on Glucophage XR. C/o burning sensation in hands, on gabapentin qhs for RLS. Mood stable on Cymbalta, using Senokot and Zofran prn only. Has not seen optho since for MD since moved here last year because told nothing could be done. Concerned about memory, having trouble remembering names of things.  Review of Systems:  Review of Systems  Constitutional: Negative for chills and fever.  Respiratory: Negative for cough and shortness of breath.   Cardiovascular: Negative for chest pain and leg swelling.  Genitourinary: Negative for difficulty urinating.  Neurological: Negative for syncope and light-headedness.    Patient Active Problem List   Diagnosis Date Noted  . Obesity (BMI 30.0-34.9) 05/02/2016  . Vitamin D deficiency 03/25/2016  . Controlled type 2 diabetes mellitus with diabetic neuropathy (HCC) 03/24/2016  . Old cerebrovascular accident (CVA) without late effect 03/24/2016  . Hypertension 03/24/2016  . Legally blind 03/24/2016  . Macular degeneration 03/24/2016  . Constipation 03/24/2016  . History of esophageal stricture 03/24/2016  . Depression with anxiety 03/24/2016  . Restless leg syndrome 03/24/2016  . Postherpetic neuralgia 03/24/2016  . Gait abnormality 03/24/2016  . Nausea 03/24/2016    Prior to Admission medications   Medication Sig Start Date End Date Taking? Authorizing Provider  Cholecalciferol (VITAMIN D3) 5000 units CAPS Take 1 capsule (5,000 Units total) by mouth daily.  06/28/16  Yes Rielyn Krupinski, Chrissie Noa, MD  DULoxetine (CYMBALTA) 30 MG capsule Take 1 capsule (30 mg total) by mouth daily. 05/30/16  Yes Lachell Rochette, Chrissie Noa, MD  gabapentin (NEURONTIN) 300 MG capsule Take 1 capsule (300 mg total) by mouth 2 (two) times daily. 12/28/16  Yes Waylynn Benefiel, Chrissie Noa, MD  Insulin Pen Needle 31G X 5 MM MISC Use with insulin pen to inject once daily. 12/07/16  Yes Palestine Mosco, Chrissie Noa, MD  Insulin Syringe-Needle U-100 (B-D INS SYR ULTRAFINE .3CC/31G) 31G X 5/16" 0.3 ML MISC Use to administer insulin daily to control diabetes. 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 10/17/16  Yes Carmita Boom, Chrissie Noa, MD  lisinopril (PRINIVIL,ZESTRIL) 10 MG tablet TAKE 1 TABLET (10 MG TOTAL) BY MOUTH DAILY. 10/03/16  Yes Billee Balcerzak, Chrissie Noa, MD  metFORMIN (GLUCOPHAGE-XR) 500 MG 24 hr tablet Take 2 tablets (1,000 mg total) by mouth daily with breakfast. 12/15/16  Yes Misa Fedorko, Chrissie Noa, MD  ondansetron (ZOFRAN) 8 MG tablet Take 0.5 tablets (4 mg total) by mouth every 8 (eight) hours as needed for nausea. 05/30/16  Yes Roshawna Colclasure, Chrissie Noa, MD  senna-docusate (SENOKOT S) 8.6-50 MG tablet Take 1 tablet by mouth daily as needed for mild constipation. 12/28/16  Yes Kamdin Follett, Chrissie Noa, MD  Insulin Glargine (LANTUS SOLOSTAR) 100 UNIT/ML Solostar Pen Inject 50 Units into the skin daily at 10 pm. 12/28/16   Schuyler Amor, MD    Allergies  Allergen Reactions  . Lyrica [Pregabalin]     Body spasms    Past Surgical History:  Procedure Laterality Date  . ABDOMINAL HYSTERECTOMY     partial  . APPENDECTOMY    . BREAST  SURGERY     reduction  . CESAREAN SECTION     x3    Social History  Substance Use Topics  . Smoking status: Former Games developermoker  . Smokeless tobacco: Never Used  . Alcohol use No    Family History  Problem Relation Age of Onset  . Family history unknown: Yes    Medication list has been reviewed and updated.  Physical Examination: BP 130/84   Pulse 80   Ht 5\' 1"  (1.549 m)   Wt 173 lb (78.5 kg)    BMI 32.69 kg/m   Physical Exam  Constitutional: She appears well-developed and well-nourished.  Cardiovascular: Normal rate, regular rhythm and normal heart sounds.   Pulmonary/Chest: Effort normal and breath sounds normal.  Musculoskeletal: She exhibits no edema.  Neurological: She is alert.  MMSE 27/30 (unable to read and copy due to vision)  Skin: Skin is warm and dry.  Psychiatric: She has a normal mood and affect. Her behavior is normal.  Nursing note and vitals reviewed.   Assessment and Plan:  1. Controlled type 2 diabetes mellitus with diabetic neuropathy, with long-term current use of insulin (HCC) Increase gabapentin to 300 mg bid, consider tid if ineffective - HgB A1c  2. Essential hypertension Well controlled on lisinopril  3. Depression with anxiety Well controlled on Cymbalta  4. Vitamin D deficiency On supplement - Vitamin D (25 hydroxy)  5. Obesity (BMI 30.0-34.9) Weight down 6#  6. Need for influenza vaccination - Flu vaccine HIGH DOSE PF (Fluzone High dose)  7. Macular degeneration of both eyes, unspecified type - Ambulatory referral to Ophthalmology  Return in about 3 months (around 03/30/2017).  Dionne AnoWilliam M. Kingsley SpittlePlonk, Jr. MD [facility]  12/29/2016

## 2017-03-17 ENCOUNTER — Telehealth: Payer: Self-pay | Admitting: Family Medicine

## 2017-03-17 NOTE — Telephone Encounter (Signed)
[facility] pt to [facility] Nurse Health Advisor. . C/b #  [id number] on Skype [email or web address] if you have questions

## 2017-03-17 NOTE — Telephone Encounter (Signed)
Asher MuirJamie, could you please reach out to Judy Harvey she has some questions about getting her prescriptions refilled she did not know the names of the ones she needed while we were on the phone. Thanks, knb

## 2017-03-17 NOTE — Telephone Encounter (Signed)
Pt returned call and scheduled for Jan for awv

## 2017-03-20 ENCOUNTER — Other Ambulatory Visit: Payer: Self-pay | Admitting: Family Medicine

## 2017-03-20 ENCOUNTER — Telehealth: Payer: Self-pay

## 2017-03-20 MED ORDER — METFORMIN HCL ER 500 MG PO TB24: 1000.0000 mg | ORAL_TABLET | Freq: Every day | ORAL | 3 refills | Status: DC

## 2017-03-20 NOTE — Telephone Encounter (Signed)
Metformin did not transmit electronic so I called in to [facility].

## 2017-03-27 ENCOUNTER — Ambulatory Visit: Payer: Medicare Other

## 2017-03-29 ENCOUNTER — Encounter: Payer: Self-pay | Admitting: Family Medicine

## 2017-03-29 ENCOUNTER — Other Ambulatory Visit
Admission: RE | Admit: 2017-03-29 | Discharge: 2017-03-29 | Disposition: A | Discharge status: Home / Self Care | Payer: Medicare Other | Source: Ambulatory Visit | Attending: Family Medicine | Admitting: Family Medicine

## 2017-03-29 ENCOUNTER — Ambulatory Visit (INDEPENDENT_AMBULATORY_CARE_PROVIDER_SITE_OTHER): Payer: Medicare Other

## 2017-03-29 ENCOUNTER — Ambulatory Visit (INDEPENDENT_AMBULATORY_CARE_PROVIDER_SITE_OTHER): Payer: Medicare Other | Admitting: Family Medicine

## 2017-03-29 VITALS — BP 108/60 | HR 78 | Temp 97.7°F | Resp 16 | Ht 61.0 in | Wt 172.0 lb

## 2017-03-29 VITALS — BP 108/60 | HR 78 | Temp 97.7°F | Resp 12 | Ht 61.0 in | Wt 172.0 lb

## 2017-03-29 DIAGNOSIS — Z0000 Encounter for general adult medical examination without abnormal findings: Secondary | ICD-10-CM | POA: Diagnosis not present

## 2017-03-29 DIAGNOSIS — Z794 Long term (current) use of insulin: Secondary | ICD-10-CM | POA: Diagnosis present

## 2017-03-29 DIAGNOSIS — F418 Other specified anxiety disorders: Secondary | ICD-10-CM

## 2017-03-29 DIAGNOSIS — I10 Essential (primary) hypertension: Secondary | ICD-10-CM | POA: Insufficient documentation

## 2017-03-29 DIAGNOSIS — Z23 Encounter for immunization: Secondary | ICD-10-CM | POA: Diagnosis not present

## 2017-03-29 DIAGNOSIS — E1140 Type 2 diabetes mellitus with diabetic neuropathy, unspecified: Secondary | ICD-10-CM | POA: Insufficient documentation

## 2017-03-29 DIAGNOSIS — R269 Unspecified abnormalities of gait and mobility: Secondary | ICD-10-CM | POA: Diagnosis not present

## 2017-03-29 DIAGNOSIS — E669 Obesity, unspecified: Secondary | ICD-10-CM | POA: Diagnosis not present

## 2017-03-29 DIAGNOSIS — E559 Vitamin D deficiency, unspecified: Secondary | ICD-10-CM | POA: Diagnosis not present

## 2017-03-29 LAB — COMPREHENSIVE METABOLIC PANEL
ALT: 12 U/L — AB (ref 14–54)
AST: 16 U/L (ref 15–41)
Albumin: 3.7 g/dL (ref 3.5–5.0)
Alkaline Phosphatase: 74 U/L (ref 38–126)
Anion gap: 8 (ref 5–15)
BILIRUBIN TOTAL: 0.6 mg/dL (ref 0.3–1.2)
BUN: 19 mg/dL (ref 6–20)
CHLORIDE: 100 mmol/L — AB (ref 101–111)
CO2: 29 mmol/L (ref 22–32)
CREATININE: 0.85 mg/dL (ref 0.44–1.00)
Calcium: 8.9 mg/dL (ref 8.9–10.3)
GFR calc Af Amer: >60 mL/min (ref >60)
GFR, EST NON AFRICAN AMERICAN: 59 mL/min — AB (ref >60)
GLUCOSE: 90 mg/dL (ref 65–99)
Potassium: 4.2 mmol/L (ref 3.5–5.1)
Sodium: 137 mmol/L (ref 135–145)
Total Protein: 7.9 g/dL (ref 6.5–8.1)

## 2017-03-29 LAB — CBC
HCT: 42.1 % (ref 35.0–47.0)
Hemoglobin: 14.3 g/dL (ref 12.0–16.0)
MCH: 31.1 pg (ref 26.0–34.0)
MCHC: 34 g/dL (ref 32.0–36.0)
MCV: 91.7 fL (ref 80.0–100.0)
PLATELETS: 322 10*3/uL (ref 150–440)
RBC: 4.59 MIL/uL (ref 3.80–5.20)
RDW: 13.5 % (ref 11.5–14.5)
WBC: 9.7 10*3/uL (ref 3.6–11.0)

## 2017-03-29 LAB — HEMOGLOBIN A1C
HEMOGLOBIN A1C: 6.7 % — AB (ref 4.8–5.6)
MEAN PLASMA GLUCOSE: 145.59 mg/dL

## 2017-03-29 LAB — VITAMIN B12: VITAMIN B 12: 356 pg/mL (ref 180–914)

## 2017-03-29 LAB — TSH: TSH: 1.221 u[IU]/mL (ref 0.350–4.500)

## 2017-03-29 LAB — LIPID PANEL
Cholesterol: 238 mg/dL — ABNORMAL HIGH (ref 0–200)
HDL: 54 mg/dL (ref >40)
LDL Cholesterol: 160 mg/dL — ABNORMAL HIGH (ref 0–99)
Total CHOL/HDL Ratio: 4.4 RATIO
Triglycerides: 122 mg/dL (ref <150)
VLDL: 24 mg/dL (ref 0–40)

## 2017-03-29 MED ORDER — GABAPENTIN 300 MG PO CAPS: 300.0000 mg | ORAL_CAPSULE | Freq: Three times a day (TID) | ORAL | 2 refills | Status: DC

## 2017-03-29 NOTE — Progress Notes (Signed)
Subjective:   Judy MorgansMildred J Harvey is a 84 y.o. female who presents for Medicare Annual (Subsequent) preventive examination.  Review of Systems:  N/A Cardiac Risk Factors include: advanced age (>1455men, 41>65 women);diabetes mellitus;hypertension;obesity (BMI >30kg/m2);sedentary lifestyle     Objective:     Vitals: BP 108/60 (BP Location: Right Arm, Patient Position: Sitting, Cuff Size: Large)   Pulse 78   Temp 97.7 F (36.5 C) (Oral)   Resp 12   Ht 5\' 1"  (1.549 m)   Wt 172 lb (78 kg)   BMI 32.50 kg/m   Body mass index is 32.5 kg/m.  Advanced Directives 03/29/2017 03/24/2016 03/05/2016 01/13/2016  Does Patient Have a Medical Advance Directive? Yes - No;Yes No  Type of Estate agentAdvance Directive Healthcare Power of [place]Attorney;Living will Healthcare Power of eBayttorney Healthcare Power of [place]orney;Living will -  Does patient want to make changes to medical advance directive? - - No - Patient declined -  Copy of Healthcare Power of Attorney in Chart? No - copy requested - No - copy requested -  Would patient like information on creating a medical advance directive? - - No - Patient declined -    Tobacco Social History   Tobacco Use  Smoking Status Former Smoker  . Packs/day: 3.00  . Years: 50.00  . Pack years: 150.00  . Types: Cigarettes  . Last attempt to quit: 2008  . Years since quitting: 11.0  Smokeless Tobacco Never Used  Tobacco Comment   smoking cessation materials not required     Counseling given: No Comment: smoking cessation materials not required   Clinical Intake:  Pre-visit preparation completed: Yes  Pain : No/denies pain     BMI - recorded: 32.5 Nutritional Status: BMI > 30  Obese Nutritional Risks: None Diabetes: Yes CBG done?: No Did pt. bring in CBG monitor from home?: No  How often do you need to have someone help you when you read instructions, pamphlets, or other written materials from your doctor or pharmacy?: 5 - Always  Interpreter Needed?:  No  Information entered by :: AEversole, LPN  Past Medical History:  Diagnosis Date  . Diabetes mellitus without complication (HCC)   . Hypertension   . Legally blind    Past Surgical History:  Procedure Laterality Date  . ABDOMINAL HYSTERECTOMY     partial  . APPENDECTOMY    . BREAST SURGERY     reduction  . CESAREAN SECTION     x3   Family History  Problem Relation Age of Onset  . Healthy Mother   . Diabetes Father   . Alzheimer's disease Father   . Multiple sclerosis Sister   . Multiple sclerosis Brother    Social History   Socioeconomic History  . Marital status: Widowed    Spouse name: None  . Number of children: 3  . Years of education: some college  . Highest education level: None  Social Needs  . Financial resource strain: Not hard at all  . Food insecurity - worry: Never true  . Food insecurity - inability: Never true  . Transportation needs - medical: Yes  . Transportation needs - non-medical: Yes  Occupational History  . Occupation: Disabled  Tobacco Use  . Smoking status: Former Smoker    Packs/day: 3.00    Years: 50.00    Pack years: 150.00    Types: Cigarettes    Last attempt to quit: 2008    Years since quitting: 11.0  . Smokeless tobacco: Never Used  .  Tobacco comment: smoking cessation materials not required  Substance and Sexual Activity  . Alcohol use: No  . Drug use: No  . Sexual activity: No  Other Topics Concern  . None  Social History Narrative  . None    Outpatient Encounter Medications as of 03/29/2017  Medication Sig  . Cholecalciferol (VITAMIN D3) 5000 units CAPS Take 1 capsule (5,000 Units total) by mouth daily.  . DULoxetine (CYMBALTA) 30 MG capsule Take 1 capsule (30 mg total) by mouth daily.  Marland Kitchen gabapentin (NEURONTIN) 300 MG capsule Take 1 capsule (300 mg total) by mouth 2 (two) times daily.  . Insulin Glargine (LANTUS SOLOSTAR) 100 UNIT/ML Solostar Pen Inject 50 Units into the skin daily at 10 pm.  . Insulin Pen  Needle 31G X 5 MM MISC Use with insulin pen to inject once daily.  . Insulin Syringe-Needle U-100 (B-D INS SYR ULTRAFINE .3CC/31G) 31G X 5/16" 0.3 ML MISC Use to administer insulin daily to control diabetes. 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  Marland Kitchen lisinopril (PRINIVIL,ZESTRIL) 10 MG tablet TAKE 1 TABLET (10 MG TOTAL) BY MOUTH DAILY.  . metFORMIN (GLUCOPHAGE-XR) 500 MG 24 hr tablet Take 2 tablets (1,000 mg total) by mouth daily with breakfast.  . senna-docusate (SENOKOT S) 8.6-50 MG tablet Take 1 tablet by mouth daily as needed for mild constipation.  . ondansetron (ZOFRAN) 8 MG tablet Take 0.5 tablets (4 mg total) by mouth every 8 (eight) hours as needed for nausea.   No facility-administered encounter medications on file as of 03/29/2017.     Activities of Daily Living In your present state of health, do you have any difficulty performing the following activities: 03/29/2017 03/29/2017  Hearing? Y -  Comment bilateral hearing aids -  Vision? Malvin Johns  Comment legally blind legally blind  Difficulty concentrating or making decisions? Y -  Comment short term memory loss -  Walking or climbing stairs? Y -  Comment fatigue -  Dressing or bathing? N -  Doing errands, shopping? Y -  Comment son transports to appts -  Quarry manager and eating ? N -  Comment denies wearing dentures -  Using the Toilet? N -  In the past six months, have you accidently leaked urine? N -  Do you have problems with loss of bowel control? N -  Managing your Medications? Y -  Comment son assits with medication administration -  Managing your Finances? Y -  Comment assitance from 2 sons -  Housekeeping or managing your Housekeeping? Y -  Comment son manges home -  Some recent data might be hidden    Patient Care Team: Schuyler Amor, MD as PCP - General (Geriatric Medicine)    Assessment:   This is a routine wellness examination for [place].  Exercise Activities and Dietary  recommendations Current Exercise Habits: The patient does not participate in regular exercise at present, Exercise limited by: Other - see comments(legally blind)  Goals    . DIET - INCREASE WATER INTAKE     Recommend to drink at least 6-8 8oz glasses of water per day.        Fall Risk Fall Risk  03/29/2017 09/26/2016 03/24/2016  Falls in the past year? No No No  Risk for fall due to : Impaired vision;Other (Comment) - -  Risk for fall due to: Comment legally blind; use of cane - -   Is the patient's home free of loose throw rugs in walkways, pet beds, electrical cords,  etc?   Yes Does the patient have any grab bars in the bathroom? Yes  Does the patient use a shower chair when bathing? Yes Does the patient have any stairs in or around the home? No If so, are there any handrails?  N/A Does the patient have adequate lighting?  Yes Does the patient use a cane, walker or w/c? Yes, cane Does the patient use of an elevated toilet seat? Yes  Timed Get Up and Go Performed: Yes. Pt ambulated 10 feet within 12 sec. Gait slow, steady and with use of assistive device and one person guidance. No intervention required at this time. Fall risk prevention has been discussed.  Depression Screen PHQ 2/9 Scores 03/29/2017 12/28/2016 09/26/2016 03/24/2016  PHQ - 2 Score 0 0 1 0  PHQ- 9 Score - 0 - -     Cognitive Function     6CIT Screen 03/29/2017  What Year? 0 points  What month? 0 points  What time? 0 points  Count back from 20 0 points  Months in reverse 2 points  Repeat phrase 2 points  Total Score 4    Immunization History  Administered Date(s) Administered  . Influenza, High Dose Seasonal PF 12/28/2016  . Pneumococcal Conjugate-13 03/24/2016  . Pneumococcal Polysaccharide-23 03/29/2017  . Tdap 03/24/2016    Qualifies for Shingles Vaccine? Yes. Due for Zostavax or Shingrix vaccine. Education has been provided regarding the importance of this vaccine. Pt has been advised to call her  insurance company to determine her out of pocket expense. Advised he/she may also receive this vaccine at her local pharmacy or Health Dept. Verbalized acceptance and understanding.  Screening Tests Health Maintenance  Topic Date Due  . FOOT EXAM  03/25/1937  . OPHTHALMOLOGY EXAM  03/25/1937  . HEMOGLOBIN A1C  06/28/2017  . TETANUS/TDAP  03/24/2026  . INFLUENZA VACCINE  Completed  . PNA vac Low Risk Adult  Completed  . DEXA SCAN  Discontinued    Cancer Screenings: Lung: Low Dose CT Chest recommended if Age 56-80 years, 30 pack-year currently smoking OR have quit w/in 15years. Patient does qualify. Mammogram? No longer required Bone Density/Dexa? No longer required Colorectal: No longer required  Additional Screenings: Hepatitis B/HIV/Syphillis: does not qualify Hepatitis C Screening: does not qualify     Plan:  I have personally reviewed and addressed the Medicare Annual Wellness questionnaire and have noted the following in the patient's chart:  A. Medical and social history B. Use of alcohol, tobacco or illicit drugs  C. Current medications and supplements D. Functional ability and status E.  Nutritional status F.  Physical activity G. Advance directives H. List of other physicians I.  Hospitalizations, surgeries, and ER visits in previous 12 months J.  Vitals K. Screenings such as hearing and vision if needed, cognitive and depression L. Referrals and appointments - none  In addition, I have reviewed and discussed with patient certain preventive protocols, quality metrics, and best practice recommendations. A written personalized care plan for preventive services as well as general preventive health recommendations were provided to patient.  Signed,  Deon Pilling, LPN Nurse Health Advisor  MD Recommendations: None

## 2017-03-29 NOTE — Patient Instructions (Signed)
[name] , Thank you for taking time to come for your Medicare Wellness Visit. I appreciate your ongoing commitment to your health goals. Please review the following plan we discussed and let me know if I can assist you in the future.   Screening recommendations/referrals: Colonoscopy: No longer required Mammogram: No longer required Bone Density: No longer required Recommended yearly ophthalmology/optometry visit for glaucoma screening and checkup Recommended yearly dental visit for hygiene and checkup  Vaccinations: Influenza vaccine: Up to date Pneumococcal vaccine: Completed series Tdap vaccine: Up to date Shingles vaccine: Declined. Please call your insurance company to determine your out of pocket expense. You may also receive this vaccine at your local pharmacy or Health Dept.  Advanced directives: Please bring a copy of your POA (Power of Attorney) and/or Living Will to your next appointment.   Conditions/risks identified: Recommend to drink at least 6-8 8oz glasses of water per day.  Next appointment: Please schedule your Annual Wellness Visit with your Nurse Health Advisor in one year.  Preventive Care 82 Years and Older, Female Preventive care refers to lifestyle choices and visits with your health care provider that can promote health and wellness. What does preventive care include?  A yearly physical exam. This is also called an annual well check.  Dental exams once or twice a year.  Routine eye exams. Ask your health care provider how often you should have your eyes checked.  Personal lifestyle choices, including:  Daily care of your teeth and gums.  Regular physical activity.  Eating a healthy diet.  Avoiding tobacco and drug use.  Limiting alcohol use.  Practicing safe sex.  Taking low-dose aspirin every day.  Taking vitamin and mineral supplements as recommended by your health care provider. What happens during an annual well check? The services  and screenings done by your health care provider during your annual well check will depend on your age, overall health, lifestyle risk factors, and family history of disease. Counseling  Your health care provider may ask you questions about your:  Alcohol use.  Tobacco use.  Drug use.  Emotional well-being.  Home and relationship well-being.  Sexual activity.  Eating habits.  History of falls.  Memory and ability to understand (cognition).  Work and work Astronomerenvironment.  Reproductive health. Screening  You may have the following tests or measurements:  Height, weight, and BMI.  Blood pressure.  Lipid and cholesterol levels. These may be checked every 5 years, or more frequently if you are over 82 years old.  Skin check.  Lung cancer screening. You may have this screening every year starting at age 82 if you have a 30-pack-year history of smoking and currently smoke or have quit within the past 15 years.  Fecal occult blood test (FOBT) of the stool. You may have this test every year starting at age 82.  Flexible sigmoidoscopy or colonoscopy. You may have a sigmoidoscopy every 5 years or a colonoscopy every 10 years starting at age 82.  Hepatitis C blood test.  Hepatitis B blood test.  Sexually transmitted disease (STD) testing.  Diabetes screening. This is done by checking your blood sugar (glucose) after you have not eaten for a while (fasting). You may have this done every 1-3 years.  Bone density scan. This is done to screen for osteoporosis. You may have this done starting at age 82.  Mammogram. This may be done every 1-2 years. Talk to your health care provider about how often you should have regular mammograms. Talk  with your health care provider about your test results, treatment options, and if necessary, the need for more tests. Vaccines  Your health care provider may recommend certain vaccines, such as:  Influenza vaccine. This is recommended every  year.  Tetanus, diphtheria, and acellular pertussis (Tdap, Td) vaccine. You may need a Td booster every 10 years.  Zoster vaccine. You may need this after age 82.  Pneumococcal 13-valent conjugate (PCV13) vaccine. One dose is recommended after age 82.  Pneumococcal polysaccharide (PPSV23) vaccine. One dose is recommended after age 82. Talk to your health care provider about which screenings and vaccines you need and how often you need them. This information is not intended to replace advice given to you by your health care provider. Make sure you discuss any questions you have with your health care provider. Document Released: 03/27/2015 Document Revised: 11/18/2015 Document Reviewed: 12/30/2014 Elsevier Interactive Patient Education  2017 [place] Prevention in the Home Falls can cause injuries. They can happen to people of all ages. There are many things you can do to make your home safe and to help prevent falls. What can I do on the outside of my home?  Regularly fix the edges of walkways and driveways and fix any cracks.  Remove anything that might make you trip as you walk through a door, such as a raised step or threshold.  Trim any bushes or trees on the path to your home.  Use bright outdoor lighting.  Clear any walking paths of anything that might make someone trip, such as rocks or tools.  Regularly check to see if handrails are loose or broken. Make sure that both sides of any steps have handrails.  Any raised decks and porches should have guardrails on the edges.  Have any leaves, snow, or ice cleared regularly.  Use sand or salt on walking paths during winter.  Clean up any spills in your garage right away. This includes oil or grease spills. What can I do in the bathroom?  Use night lights.  Install grab bars by the toilet and in the tub and shower. Do not use towel bars as grab bars.  Use non-skid mats or decals in the tub or shower.  If you  need to sit down in the shower, use a plastic, non-slip stool.  Keep the floor dry. Clean up any water that spills on the floor as soon as it happens.  Remove soap buildup in the tub or shower regularly.  Attach bath mats securely with double-sided non-slip rug tape.  Do not have throw rugs and other things on the floor that can make you trip. What can I do in the bedroom?  Use night lights.  Make sure that you have a light by your bed that is easy to reach.  Do not use any sheets or blankets that are too big for your bed. They should not hang down onto the floor.  Have a firm chair that has side arms. You can use this for support while you get dressed.  Do not have throw rugs and other things on the floor that can make you trip. What can I do in the kitchen?  Clean up any spills right away.  Avoid walking on wet floors.  Keep items that you use a lot in easy-to-reach places.  If you need to reach something above you, use a strong step stool that has a grab bar.  Keep electrical cords out of the way.  Do  not use floor polish or wax that makes floors slippery. If you must use wax, use non-skid floor wax.  Do not have throw rugs and other things on the floor that can make you trip. What can I do with my stairs?  Do not leave any items on the stairs.  Make sure that there are handrails on both sides of the stairs and use them. Fix handrails that are broken or loose. Make sure that handrails are as long as the stairways.  Check any carpeting to make sure that it is firmly attached to the stairs. Fix any carpet that is loose or worn.  Avoid having throw rugs at the top or bottom of the stairs. If you do have throw rugs, attach them to the floor with carpet tape.  Make sure that you have a light switch at the top of the stairs and the bottom of the stairs. If you do not have them, ask someone to add them for you. What else can I do to help prevent falls?  Wear shoes  that:  Do not have high heels.  Have rubber bottoms.  Are comfortable and fit you well.  Are closed at the toe. Do not wear sandals.  If you use a stepladder:  Make sure that it is fully opened. Do not climb a closed stepladder.  Make sure that both sides of the stepladder are locked into place.  Ask someone to hold it for you, if possible.  Clearly mark and make sure that you can see:  Any grab bars or handrails.  First and last steps.  Where the edge of each step is.  Use tools that help you move around (mobility aids) if they are needed. These include:  Canes.  Walkers.  Scooters.  Crutches.  Turn on the lights when you go into a dark area. Replace any light bulbs as soon as they burn out.  Set up your furniture so you have a clear path. Avoid moving your furniture around.  If any of your floors are uneven, fix them.  If there are any pets around you, be aware of where they are.  Review your medicines with your doctor. Some medicines can make you feel dizzy. This can increase your chance of falling. Ask your doctor what other things that you can do to help prevent falls. This information is not intended to replace advice given to you by your health care provider. Make sure you discuss any questions you have with your health care provider. Document Released: 12/25/2008 Document Revised: 08/06/2015 Document Reviewed: 04/04/2014 Elsevier Interactive Patient Education  2017 [employer].

## 2017-03-30 ENCOUNTER — Telehealth: Payer: Self-pay | Admitting: *Deleted

## 2017-03-30 NOTE — Telephone Encounter (Signed)
Received referral for initial lung cancer screening scan. Contacted patient and discussed current recommendations do not include lung cancer screening above 8077 to 82 years of age (depending on source). Patient verbalizes understanding.

## 2017-03-30 NOTE — Progress Notes (Signed)
Date:  03/29/2017   Name:  Judy MorgansMildred J Harvey   DOB:  March 02, 1928   MRN:  [id number]  PCP:  Schuyler AmorPlonk, Clary Meeker, MD    Chief Complaint: Diabetes (Neuropathy in hands is bad can we adjust meds? Asked about Freestyle Libre the non stick BS meter. )   History of Present Illness:  This is a 84 y.o. female seen same day for increased numbness/pain in B hands. On gabapentin bid, tolerating well. Mood well controlled on Cymbalta, a1c/vit D levels good in Oct, still wobbly when walks.  Review of Systems:  Review of Systems  Constitutional: Negative for chills and fever.  Respiratory: Negative for cough and shortness of breath.   Cardiovascular: Negative for chest pain and leg swelling.  Genitourinary: Negative for difficulty urinating.  Neurological: Negative for syncope and light-headedness.    Patient Active Problem List   Diagnosis Date Noted  . Obesity (BMI 30.0-34.9) 05/02/2016  . Vitamin D deficiency 03/25/2016  . Controlled type 2 diabetes mellitus with diabetic neuropathy (HCC) 03/24/2016  . Old cerebrovascular accident (CVA) without late effect 03/24/2016  . Hypertension 03/24/2016  . Legally blind 03/24/2016  . Macular degeneration 03/24/2016  . Constipation 03/24/2016  . History of esophageal stricture 03/24/2016  . Depression with anxiety 03/24/2016  . Restless leg syndrome 03/24/2016  . Postherpetic neuralgia 03/24/2016  . Gait abnormality 03/24/2016    Prior to Admission medications   Medication Sig Start Date End Date Taking? Authorizing Provider  Cholecalciferol (VITAMIN D3) 5000 units CAPS Take 1 capsule (5,000 Units total) by mouth daily. 06/28/16  Yes Ellesse Antenucci, Chrissie NoaWilliam, MD  DULoxetine (CYMBALTA) 30 MG capsule Take 1 capsule (30 mg total) by mouth daily. 05/30/16  Yes Nazariah Cadet, Chrissie NoaWilliam, MD  gabapentin (NEURONTIN) 300 MG capsule Take 1 capsule (300 mg total) by mouth 3 (three) times daily. 03/29/17  Yes Kemara Quigley, Chrissie NoaWilliam, MD  Insulin Glargine (LANTUS SOLOSTAR) 100 UNIT/ML Solostar  Pen Inject 50 Units into the skin daily at 10 pm. 12/28/16  Yes Donise Woodle, Chrissie NoaWilliam, MD  Insulin Pen Needle 31G X 5 MM MISC Use with insulin pen to inject once daily. 12/07/16  Yes Harlis Champoux, Chrissie NoaWilliam, MD  Insulin Syringe-Needle U-100 (B-D INS SYR ULTRAFINE .3CC/31G) 31G X 5/16" 0.3 ML MISC Use to administer insulin daily to control diabetes. 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 10/17/16  Yes Carzell Saldivar, Chrissie NoaWilliam, MD  lisinopril (PRINIVIL,ZESTRIL) 10 MG tablet TAKE 1 TABLET (10 MG TOTAL) BY MOUTH DAILY. 10/03/16  Yes Yamile Roedl, Chrissie NoaWilliam, MD  metFORMIN (GLUCOPHAGE-XR) 500 MG 24 hr tablet Take 2 tablets (1,000 mg total) by mouth daily with breakfast. 03/20/17  Yes Cervando Durnin, Chrissie NoaWilliam, MD  ondansetron (ZOFRAN) 8 MG tablet Take 0.5 tablets (4 mg total) by mouth every 8 (eight) hours as needed for nausea. 05/30/16  Yes Skanda Worlds, Chrissie NoaWilliam, MD  senna-docusate (SENOKOT S) 8.6-50 MG tablet Take 1 tablet by mouth daily as needed for mild constipation. 12/28/16  Yes Evgenia Merriman, Chrissie NoaWilliam, MD    Allergies  Allergen Reactions  . Lyrica [Pregabalin]     Body spasms    Past Surgical History:  Procedure Laterality Date  . ABDOMINAL HYSTERECTOMY     partial  . APPENDECTOMY    . BREAST SURGERY     reduction  . CESAREAN SECTION     x3    Social History   Tobacco Use  . Smoking status: Former Smoker    Packs/day: 3.00    Years: 50.00    Pack years: 150.00    Types: Cigarettes  Last attempt to quit: 2008    Years since quitting: 11.0  . Smokeless tobacco: Never Used  . Tobacco comment: smoking cessation materials not required  Substance Use Topics  . Alcohol use: No  . Drug use: No    Family History  Problem Relation Age of Onset  . Healthy Mother   . Diabetes Father   . Alzheimer's disease Father   . Multiple sclerosis Sister   . Multiple sclerosis Brother     Medication list has been reviewed and updated.  Physical Examination: BP 108/60   Pulse 78   Temp 97.7 F (36.5 C) (Oral)    Resp 16   Ht 5\' 1"  (1.549 m)   Wt 172 lb (78 kg)   SpO2 97%   BMI 32.50 kg/m   Physical Exam  Constitutional: She appears well-developed and well-nourished.  Cardiovascular: Normal rate, regular rhythm and normal heart sounds.  Pulmonary/Chest: Effort normal and breath sounds normal.  Musculoskeletal:  Trace BLE edema  Neurological: She is alert.  Skin: Skin is warm and dry.  Psychiatric: She has a normal mood and affect. Her behavior is normal.  Nursing note and vitals reviewed.   Assessment and Plan:  1. Controlled type 2 diabetes mellitus with diabetic neuropathy, with long-term current use of insulin (HCC) Well controlled on metformin/Lantus, increase gabapentin to 300 mg tid, call with results next week, consider further titration - HgB A1c - TSH - Lipid Profile  2. Essential hypertension Well controlled on lisinopril - Comprehensive Metabolic Panel (CMET) - CBC  3. Depression with anxiety Well controlled on Cymbalta  4. Gait abnormality - B12  5. Vitamin D deficiency Well controlled on supplement  6. Obesity (BMI 30.0-34.9) Stable, exercise/weight loss discussed  Return in about 3 months (around 06/27/2017).  Judy Harvey. [name] MD [facility]  03/30/2017

## 2017-04-03 ENCOUNTER — Ambulatory Visit: Payer: Medicare Other | Admitting: Family Medicine

## 2017-04-06 ENCOUNTER — Other Ambulatory Visit: Payer: Self-pay

## 2017-04-06 MED ORDER — GABAPENTIN 300 MG PO CAPS: 300.0000 mg | ORAL_CAPSULE | Freq: Three times a day (TID) | ORAL | 2 refills | Status: DC

## 2017-05-01 ENCOUNTER — Telehealth: Payer: Self-pay

## 2017-05-01 ENCOUNTER — Other Ambulatory Visit: Payer: Self-pay | Admitting: Family Medicine

## 2017-05-01 NOTE — Telephone Encounter (Signed)
Please send the new BS meter Freestyle Libre with dx and instructions. Advised they prob will not cover and no prior auth necessary if it comes back. She said pharmacy thinks they will cover due to blindness.

## 2017-05-01 NOTE — Telephone Encounter (Signed)
Unsure how to order correctly as not ordered before. In computer there are separate orders for device and sensors. Please call pharmacy to see if they can help.

## 2017-05-02 ENCOUNTER — Other Ambulatory Visit: Payer: Self-pay

## 2017-05-02 DIAGNOSIS — Z794 Long term (current) use of insulin: Principal | ICD-10-CM

## 2017-05-02 DIAGNOSIS — E1139 Type 2 diabetes mellitus with other diabetic ophthalmic complication: Secondary | ICD-10-CM

## 2017-05-02 DIAGNOSIS — H548 Legal blindness, as defined in USA: Secondary | ICD-10-CM

## 2017-05-02 MED ORDER — FREESTYLE LIBRE 14 DAY READER DEVI: 1.0000 | Freq: Four times a day (QID) | 0 refills | Status: DC

## 2017-05-02 MED ORDER — FREESTYLE LIBRE 14 DAY SENSOR MISC: 1.0000 | Freq: Four times a day (QID) | 6 refills | Status: DC

## 2017-06-09 ENCOUNTER — Other Ambulatory Visit: Payer: Self-pay | Admitting: Family Medicine

## 2017-06-09 MED ORDER — DULOXETINE HCL 30 MG PO CPEP: 30.0000 mg | ORAL_CAPSULE | Freq: Every day | ORAL | 3 refills | Status: DC

## 2017-06-12 ENCOUNTER — Other Ambulatory Visit: Payer: Self-pay | Admitting: Family Medicine

## 2017-06-12 MED ORDER — INSULIN GLARGINE 100 UNIT/ML SOLOSTAR PEN: 50.0000 [IU] | PEN_INJECTOR | Freq: Every day | SUBCUTANEOUS | 3 refills | Status: DC

## 2017-06-16 ENCOUNTER — Ambulatory Visit: Payer: Medicare Other | Admitting: Family Medicine

## 2017-06-16 ENCOUNTER — Encounter: Payer: Self-pay | Admitting: Family Medicine

## 2017-06-16 VITALS — BP 134/81 | HR 84 | Resp 16 | Ht 61.0 in | Wt 174.0 lb

## 2017-06-16 DIAGNOSIS — H3530 Unspecified macular degeneration: Secondary | ICD-10-CM | POA: Diagnosis not present

## 2017-06-16 DIAGNOSIS — I10 Essential (primary) hypertension: Secondary | ICD-10-CM

## 2017-06-16 DIAGNOSIS — E559 Vitamin D deficiency, unspecified: Secondary | ICD-10-CM

## 2017-06-16 DIAGNOSIS — I6523 Occlusion and stenosis of bilateral carotid arteries: Secondary | ICD-10-CM

## 2017-06-16 DIAGNOSIS — G459 Transient cerebral ischemic attack, unspecified: Secondary | ICD-10-CM | POA: Diagnosis not present

## 2017-06-16 DIAGNOSIS — E785 Hyperlipidemia, unspecified: Secondary | ICD-10-CM

## 2017-06-16 DIAGNOSIS — E1140 Type 2 diabetes mellitus with diabetic neuropathy, unspecified: Secondary | ICD-10-CM

## 2017-06-16 DIAGNOSIS — Z794 Long term (current) use of insulin: Secondary | ICD-10-CM | POA: Diagnosis not present

## 2017-06-16 NOTE — Progress Notes (Signed)
Date:  06/16/2017   Name:  Judy Harvey   DOB:  [birth year]   MRN:  [id number]  PCP:  Schuyler AmorPlonk, Nema Oatley, MD    Chief Complaint: Transient Ischemic Attack ([place] advised to have the CD sent to medical records... 06/11/2017 is when it happened. [facility] patient feeling better but was advised may need Thorasic Surgeon due to blockage in carotid Arteries  ) and Headache (Has been having headaches since last OV and has them often. )   History of Present Illness:  This is a 84 y.o. female seen for three month f/u. [place] with [place], carotid dopplers showed 100% occlusion R, 80% occlusion L, vascular referral advised, echo ok. Placed on asa/Plavix/Lipitor but has only been taking a few days. Neuropathy improved on increased gabapentin, mood stable, deconditioned from hospital stay but getting stronger.  Review of Systems:  Review of Systems  Constitutional: Negative for chills and fever.  Respiratory: Negative for cough and shortness of breath.   Cardiovascular: Negative for chest pain and leg swelling.  Genitourinary: Negative for difficulty urinating.  Neurological: Negative for syncope and light-headedness.    Patient Active Problem List   Diagnosis Date Noted  . TIA (transient ischemic attack) 06/16/2017  . Carotid stenosis 06/16/2017  . Hyperlipidemia 06/16/2017  . Obesity (BMI 30.0-34.9) 05/02/2016  . Vitamin D deficiency 03/25/2016  . Controlled type 2 diabetes mellitus with diabetic neuropathy (HCC) 03/24/2016  . Old cerebrovascular accident (CVA) without late effect 03/24/2016  . Hypertension 03/24/2016  . Legally blind 03/24/2016  . Macular degeneration 03/24/2016  . Constipation 03/24/2016  . History of esophageal stricture 03/24/2016  . Depression with anxiety 03/24/2016  . Restless leg syndrome 03/24/2016  . Postherpetic neuralgia 03/24/2016  . Gait abnormality 03/24/2016    Prior to Admission medications    Medication Sig Start Date End Date Taking? Authorizing Provider  Acetaminophen (TYLENOL) 325 MG CAPS Take by mouth.   Yes [provider]  aspirin EC 81 MG tablet Take 81 mg by mouth daily.   Yes [provider]  atorvastatin (LIPITOR) 80 MG tablet Take 80 mg by mouth at bedtime. 06/13/17  Yes [provider]  Cholecalciferol (VITAMIN D3) 5000 units CAPS Take 1 capsule (5,000 Units total) by mouth daily. 06/28/16  Yes Iyona Pehrson, Chrissie NoaWilliam, MD  clopidogrel (PLAVIX) 75 MG tablet Take 75 mg by mouth daily. 06/13/17  Yes [provider]  Continuous Blood Gluc Receiver (FREESTYLE LIBRE 14 DAY READER) DEVI 1 each by Does not apply route 4 (four) times daily. 05/02/17  Yes Azarie Coriz, Chrissie NoaWilliam, MD  Continuous Blood Gluc Sensor (FREESTYLE LIBRE 14 DAY SENSOR) MISC 1 each by Does not apply route 4 (four) times daily. Use with [place] to check Blood Sugar up to 4 times daily for Diabetes ICD 10 E11.9. 05/02/17  Yes Lakyia Behe, Chrissie NoaWilliam, MD  DULoxetine (CYMBALTA) 30 MG capsule Take 1 capsule (30 mg total) by mouth daily. 06/09/17  Yes Dontaye Hur, Chrissie NoaWilliam, MD  gabapentin (NEURONTIN) 300 MG capsule Take 1 capsule (300 mg total) by mouth 3 (three) times daily. 04/06/17  Yes Keiley Levey, Chrissie NoaWilliam, MD  Insulin Glargine (LANTUS SOLOSTAR) 100 UNIT/ML Solostar Pen Inject 50 Units into the skin daily at 10 pm. 06/12/17  Yes Yatzari Jonsson, Chrissie NoaWilliam, MD  Insulin Pen Needle 31G X 5 MM MISC Use with insulin pen to inject once daily. 12/07/16  Yes Conrado Nance, Chrissie NoaWilliam, MD  Insulin Syringe-Needle U-100 (B-D INS SYR ULTRAFINE .3CC/31G) 31G X 5/16" 0.3  ML MISC Use to administer insulin daily to control diabetes. 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 10/17/16  Yes Janell Keeling, Chrissie Noa, MD  lisinopril (PRINIVIL,ZESTRIL) 10 MG tablet TAKE 1 TABLET (10 MG TOTAL) BY MOUTH DAILY. 10/03/16  Yes Evalyse Stroope, Chrissie Noa, MD  metFORMIN (GLUCOPHAGE-XR) 500 MG 24 hr tablet Take 2 tablets (1,000 mg total) by mouth daily with breakfast. 03/20/17  Yes  Lindalou Soltis, Chrissie Noa, MD  ondansetron (ZOFRAN) 8 MG tablet Take 0.5 tablets (4 mg total) by mouth every 8 (eight) hours as needed for nausea. 05/30/16  Yes Keishaun Hazel, Chrissie Noa, MD  senna-docusate (SENOKOT S) 8.6-50 MG tablet Take 1 tablet by mouth daily as needed for mild constipation. 12/28/16  Yes Rilea Arutyunyan, Chrissie Noa, MD    Allergies  Allergen Reactions  . Lyrica [Pregabalin]     Body spasms    Past Surgical History:  Procedure Laterality Date  . ABDOMINAL HYSTERECTOMY     partial  . APPENDECTOMY    . BREAST SURGERY     reduction  . CESAREAN SECTION     x3    Social History   Tobacco Use  . Smoking status: Former Smoker    Packs/day: 3.00    Years: 50.00    Pack years: 150.00    Types: Cigarettes    Last attempt to quit: 2008    Years since quitting: 11.2  . Smokeless tobacco: Never Used  . Tobacco comment: smoking cessation materials not required  Substance Use Topics  . Alcohol use: No  . Drug use: No    Family History  Problem Relation Age of Onset  . Healthy Mother   . Diabetes Father   . Alzheimer's disease Father   . Multiple sclerosis Sister   . Multiple sclerosis Brother     Medication list has been reviewed and updated.  Physical Examination: BP 134/81   Pulse 84   Resp 16   Ht 5\' 1"  (1.549 m)   Wt 174 lb (78.9 kg)   SpO2 98%   BMI 32.88 kg/m   Physical Exam  Constitutional: She appears well-developed and well-nourished.  Cardiovascular: Normal rate, regular rhythm and normal heart sounds.  Pulmonary/Chest: Effort normal and breath sounds normal.  Musculoskeletal: She exhibits no edema.  Neurological: She is alert.  Skin: Skin is warm and dry.  Psychiatric: She has a normal mood and affect. Her behavior is normal.  Nursing note and vitals reviewed.   Assessment and Plan:  1. Controlled type 2 diabetes mellitus with diabetic neuropathy, with long-term current use of insulin (HCC) Well controlled on metformin/Lantus (a1c 6.7% in Jan, Vancleave ok in July),  neuropathy improved on Cymbalta/gabapentin, consider repeat a1c next visit  2. TIA (transient ischemic attack) Cont DAPT for now, consider change to asa alone next visit  3. Bilateral carotid artery stenosis High surgical risk given age and comorbidities, advised against vascular referral  4. Essential hypertension Adequate control on lisinopril  5. Hyperlipidemia, unspecified hyperlipidemia type On high dose Lipitor x few days only, consider lipids next visit  6. Macular degeneration of both eyes, unspecified type Legally blind, no further interventions per ophtho  7. Vitamin D deficiency On high dose supplement, consider repeat level next visit  Return in about 1 month (around 07/14/2017).  [name]. [name] MD [facility]  06/16/2017

## 2017-06-27 ENCOUNTER — Ambulatory Visit: Payer: Medicare Other | Admitting: Family Medicine

## 2017-07-12 ENCOUNTER — Other Ambulatory Visit: Payer: Self-pay

## 2017-07-12 MED ORDER — GABAPENTIN 300 MG PO CAPS: 300.0000 mg | ORAL_CAPSULE | Freq: Three times a day (TID) | ORAL | 0 refills | Status: DC

## 2017-07-17 ENCOUNTER — Encounter: Payer: Self-pay | Admitting: Family Medicine

## 2017-07-17 ENCOUNTER — Ambulatory Visit: Payer: Medicare Other | Admitting: Family Medicine

## 2017-07-17 VITALS — BP 130/70 | HR 80 | Ht 61.0 in | Wt 175.0 lb

## 2017-07-17 DIAGNOSIS — I6523 Occlusion and stenosis of bilateral carotid arteries: Secondary | ICD-10-CM | POA: Diagnosis not present

## 2017-07-17 MED ORDER — LISINOPRIL 10 MG PO TABS: 10.0000 mg | ORAL_TABLET | Freq: Every day | ORAL | 1 refills | Status: DC

## 2017-07-17 MED ORDER — METFORMIN HCL ER 500 MG PO TB24: 1000.0000 mg | ORAL_TABLET | Freq: Every day | ORAL | 1 refills | Status: DC

## 2017-07-17 MED ORDER — INSULIN GLARGINE 100 UNIT/ML SOLOSTAR PEN: 50.0000 [IU] | PEN_INJECTOR | Freq: Every day | SUBCUTANEOUS | 3 refills | Status: DC

## 2017-07-17 MED ORDER — ATORVASTATIN CALCIUM 80 MG PO TABS: 80.0000 mg | ORAL_TABLET | Freq: Every day | ORAL | 1 refills | Status: DC

## 2017-07-17 NOTE — Progress Notes (Signed)
Name: Judy Harvey   MRN: [id number]    DOB: Sep 14, 1927   Date:07/17/2017       Progress Note  Subjective  Chief Complaint  Chief Complaint  Patient presents with  . Follow-up    stroke at [facility]- put on plavix and aspirin     Neurologic Problem  The patient's pertinent negatives include no altered mental status, clumsiness, focal sensory loss, focal weakness, loss of balance, memory loss, near-syncope, slurred speech, syncope, visual change or weakness. Primary symptoms comment: s/p tia. This is a recurrent problem. The current episode started more than 1 month ago. The neurological problem developed suddenly. The problem has been waxing and waning since onset. Pertinent negatives include no abdominal pain, auditory change, aura, back pain, bladder incontinence, bowel incontinence, chest pain, confusion, diaphoresis, dizziness, fatigue, fever, headaches, light-headedness, nausea, neck pain, palpitations, shortness of breath, vertigo or vomiting. Past treatments include aspirin. The treatment provided mild relief. Her past medical history is significant for a CVA. There is no history of a bleeding disorder, a clotting disorder, dementia, head trauma, liver disease, mood changes or seizures.  Hyperlipidemia  This is a chronic problem. The current episode started more than 1 year ago. The problem is uncontrolled. Recent lipid tests were reviewed and are variable. Exacerbating diseases include diabetes. She has no history of chronic renal disease, hypothyroidism, liver disease, obesity or nephrotic syndrome. Pertinent negatives include no chest pain, focal sensory loss, focal weakness, myalgias or shortness of breath. Current antihyperlipidemic treatment includes statins. There are no compliance problems.   Hypertension  This is a new problem. The current episode started more than 1 month ago. The problem is controlled. Pertinent negatives include no anxiety, blurred vision, chest pain,  headaches, malaise/fatigue, neck pain, orthopnea, palpitations, peripheral edema, PND or shortness of breath. Risk factors for coronary artery disease include diabetes mellitus, dyslipidemia and post-menopausal state. Past treatments include ACE inhibitors. There is no history of chronic renal disease.  Diabetes  She presents for her follow-up diabetic visit. She has type 2 diabetes mellitus. Her disease course has been stable. Pertinent negatives for hypoglycemia include no confusion, dizziness, headaches, mood changes or nervousness/anxiousness. Pertinent negatives for diabetes include no blurred vision, no chest pain, no fatigue, no polydipsia, no visual change, no weakness and no weight loss. There are no hypoglycemic complications. Symptoms are stable. There are no diabetic complications. Risk factors for coronary artery disease include dyslipidemia, diabetes mellitus and hypertension. Current diabetic treatment includes insulin injections and oral agent (monotherapy). She is compliant with treatment some of the time.    No problem-specific Assessment & Plan notes found for this encounter.   Past Medical History:  Diagnosis Date  . Depression   . Diabetes mellitus without complication (HCC)   . Hyperlipidemia   . Hypertension   . Legally blind   . Peripheral neuropathy   . Stroke [facility])     Past Surgical History:  Procedure Laterality Date  . ABDOMINAL HYSTERECTOMY     partial  . APPENDECTOMY    . BREAST SURGERY     reduction  . CESAREAN SECTION     x3    Family History  Problem Relation Age of Onset  . Healthy Mother   . Diabetes Father   . Alzheimer's disease Father   . Multiple sclerosis Sister   . Multiple sclerosis Brother     Social History   Socioeconomic History  . Marital status: Widowed    Spouse name: Not on file  .  Number of children: 3  . Years of education: some college  . Highest education level: Not on file  Occupational History  . Occupation:  Disabled  Social Needs  . Financial resource strain: Not hard at all  . Food insecurity:    Worry: Never true    Inability: Never true  . Transportation needs:    Medical: Yes    Non-medical: Yes  Tobacco Use  . Smoking status: Former Smoker    Packs/day: 3.00    Years: 50.00    Pack years: 150.00    Types: Cigarettes    Last attempt to quit: 2008    Years since quitting: 11.3  . Smokeless tobacco: Never Used  . Tobacco comment: smoking cessation materials not required  Substance and Sexual Activity  . Alcohol use: No  . Drug use: No  . Sexual activity: Never  Lifestyle  . Physical activity:    Days per week: 0 days    Minutes per session: 0 min  . Stress: Not at all  Relationships  . Social connections:    Talks on phone: Patient refused    Gets together: Patient refused    Attends religious service: Patient refused    Active member of club or organization: Patient refused    Attends meetings of clubs or organizations: Patient refused    Relationship status: Widowed  . Intimate partner violence:    Fear of current or ex partner: Patient refused    Emotionally abused: Patient refused    Physically abused: Patient refused    Forced sexual activity: Patient refused  Other Topics Concern  . Not on file  Social History Narrative  . Not on file    Allergies  Allergen Reactions  . Lyrica [Pregabalin]     Body spasms    Outpatient Medications Prior to Visit  Medication Sig Dispense Refill  . aspirin EC 81 MG tablet Take 81 mg by mouth daily.    . Cholecalciferol (VITAMIN D3) 5000 units CAPS Take 1 capsule (5,000 Units total) by mouth daily. 30 capsule   . clopidogrel (PLAVIX) 75 MG tablet Take 75 mg by mouth daily.  0  . DULoxetine (CYMBALTA) 30 MG capsule Take 1 capsule (30 mg total) by mouth daily. 90 capsule 3  . gabapentin (NEURONTIN) 300 MG capsule Take 1 capsule (300 mg total) by mouth 3 (three) times daily. 90 capsule 0  . Insulin Pen Needle 31G X 5 MM MISC  Use with insulin pen to inject once daily. 30 each 3  . Insulin Syringe-Needle U-100 (B-D INS SYR ULTRAFINE .3CC/31G) 31G X 5/16" 0.3 ML MISC Use to administer insulin daily to control diabetes. 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 100 each 3  . ondansetron (ZOFRAN) 8 MG tablet Take 0.5 tablets (4 mg total) by mouth every 8 (eight) hours as needed for nausea. 90 tablet 3  . senna-docusate (SENOKOT S) 8.6-50 MG tablet Take 1 tablet by mouth daily as needed for mild constipation.    Marland Kitchen atorvastatin (LIPITOR) 80 MG tablet Take 80 mg by mouth at bedtime.  0  . Continuous Blood Gluc Sensor (FREESTYLE LIBRE 14 DAY SENSOR) MISC 1 each by Does not apply route 4 (four) times daily. Use with [place] to check Blood Sugar up to 4 times daily for Diabetes ICD 10 E11.9. 2 each 6  . Insulin Glargine (LANTUS SOLOSTAR) 100 UNIT/ML Solostar Pen Inject 50 Units into the skin daily at 10 pm. 5 pen 3  .  lisinopril (PRINIVIL,ZESTRIL) 10 MG tablet TAKE 1 TABLET (10 MG TOTAL) BY MOUTH DAILY. 90 tablet 3  . metFORMIN (GLUCOPHAGE-XR) 500 MG 24 hr tablet Take 2 tablets (1,000 mg total) by mouth daily with breakfast. 180 tablet 3  . Acetaminophen (TYLENOL) 325 MG CAPS Take by mouth.    . Continuous Blood Gluc Receiver (FREESTYLE LIBRE 14 DAY READER) DEVI 1 each by Does not apply route 4 (four) times daily. 1 Device 0   No facility-administered medications prior to visit.     Review of Systems  Constitutional: Negative for chills, diaphoresis, fatigue, fever, malaise/fatigue and weight loss.  HENT: Negative for ear discharge, ear pain and sore throat.   Eyes: Negative for blurred vision.  Respiratory: Negative for cough, sputum production, shortness of breath and wheezing.   Cardiovascular: Negative for chest pain, palpitations, orthopnea, leg swelling, PND and near-syncope.  Gastrointestinal: Negative for abdominal pain, blood in stool, bowel incontinence, constipation, heartburn, melena,  nausea and vomiting.  Genitourinary: Negative for bladder incontinence, dysuria, frequency, hematuria and urgency.  Musculoskeletal: Negative for back pain, joint pain, myalgias and neck pain.  Skin: Negative for rash.  Neurological: Negative for dizziness, vertigo, tingling, sensory change, focal weakness, syncope, weakness, light-headedness, headaches and loss of balance.  Endo/Heme/Allergies: Negative for environmental allergies and polydipsia. Does not bruise/bleed easily.  Psychiatric/Behavioral: Negative for confusion, depression, memory loss and suicidal ideas. The patient is not nervous/anxious and does not have insomnia.      Objective  Vitals:   07/17/17 1119  BP: 130/70  Pulse: 80  Weight: 175 lb (79.4 kg)  Height:  (1.549 m)    Physical Exam  Constitutional: She is oriented to person, place, and time. She appears well-developed and well-nourished.  HENT:  Head: Normocephalic.  Right Ear: External ear normal.  Left Ear: External ear normal.  Eyes: Lids are everted and swept, no foreign bodies found. Left eye exhibits no hordeolum. No foreign body present in the left eye. Right conjunctiva is not injected. Left conjunctiva is not injected.  Neck: Normal range of motion. Neck supple. No JVD present. No tracheal deviation present. No thyromegaly present.  Cardiovascular: Normal rate, regular rhythm, normal heart sounds and intact distal pulses. Exam reveals no gallop and no friction rub.  No murmur heard. Pulmonary/Chest: Effort normal and breath sounds normal. No respiratory distress. She has no wheezes. She has no rales.  Abdominal: Soft. Bowel sounds are normal. There is no hepatosplenomegaly. There is no tenderness.  Musculoskeletal: Normal range of motion.  Lymphadenopathy:    She has no cervical adenopathy.  Neurological: She is alert and oriented to person, place, and time. She has normal strength.  Skin: Skin is warm. No rash noted.  Psychiatric: She has a  normal mood and affect. Her mood appears not anxious. She does not exhibit a depressed mood.  Nursing note and vitals reviewed.     Assessment & Plan  Problem List Items Addressed This Visit    None    Visit Diagnoses    Carotid atherosclerosis, bilateral    -  Primary   Relevant Medications   lisinopril (PRINIVIL,ZESTRIL) 10 MG tablet   atorvastatin (LIPITOR) 80 MG tablet   Other Relevant Orders   Ambulatory referral to Vascular Surgery      Meds ordered this encounter  Medications  . metFORMIN (GLUCOPHAGE-XR) 500 MG 24 hr tablet    Sig: Take 2 tablets (1,000 mg total) by mouth daily with breakfast.    Dispense:  180 tablet  Refill:  1  . lisinopril (PRINIVIL,ZESTRIL) 10 MG tablet    Sig: Take 1 tablet (10 mg total) by mouth daily.    Dispense:  90 tablet    Refill:  1  . Insulin Glargine (LANTUS SOLOSTAR) 100 UNIT/ML Solostar Pen    Sig: Inject 50 Units into the skin daily at 10 pm.    Dispense:  5 pen    Refill:  3  . atorvastatin (LIPITOR) 80 MG tablet    Sig: Take 1 tablet (80 mg total) by mouth at bedtime.    Dispense:  90 tablet    Refill:  1      Dr. Elizabeth Sauer [facility]  07/17/17

## 2017-08-03 ENCOUNTER — Other Ambulatory Visit: Payer: Self-pay | Admitting: Family Medicine

## 2017-08-08 ENCOUNTER — Encounter (INDEPENDENT_AMBULATORY_CARE_PROVIDER_SITE_OTHER): Payer: Medicare Other | Admitting: Vascular Surgery

## 2017-08-08 ENCOUNTER — Other Ambulatory Visit: Payer: Self-pay | Admitting: Family Medicine

## 2017-08-11 ENCOUNTER — Other Ambulatory Visit: Payer: Self-pay

## 2017-08-29 ENCOUNTER — Other Ambulatory Visit: Payer: Self-pay

## 2017-08-29 MED ORDER — ONDANSETRON HCL 8 MG PO TABS: 4.0000 mg | ORAL_TABLET | Freq: Three times a day (TID) | ORAL | 0 refills | Status: DC | PRN

## 2017-09-02 ENCOUNTER — Other Ambulatory Visit: Payer: Self-pay | Admitting: Family Medicine

## 2017-09-07 ENCOUNTER — Telehealth (INDEPENDENT_AMBULATORY_CARE_PROVIDER_SITE_OTHER): Payer: Self-pay

## 2017-09-07 NOTE — Telephone Encounter (Signed)
Patient called to see if she can get rescheduled for her Carotid stenosis. She was referred here in May, but the patient was a No show in May, so now she needs to get put back in the schedule.

## 2017-09-08 ENCOUNTER — Other Ambulatory Visit (INDEPENDENT_AMBULATORY_CARE_PROVIDER_SITE_OTHER): Payer: Self-pay

## 2017-09-08 DIAGNOSIS — I6523 Occlusion and stenosis of bilateral carotid arteries: Secondary | ICD-10-CM

## 2017-09-26 ENCOUNTER — Encounter (INDEPENDENT_AMBULATORY_CARE_PROVIDER_SITE_OTHER): Admitting: Vascular Surgery

## 2017-09-26 ENCOUNTER — Encounter (INDEPENDENT_AMBULATORY_CARE_PROVIDER_SITE_OTHER): Payer: Self-pay | Admitting: Vascular Surgery

## 2017-09-26 ENCOUNTER — Ambulatory Visit (INDEPENDENT_AMBULATORY_CARE_PROVIDER_SITE_OTHER): Payer: Medicare Other | Admitting: Vascular Surgery

## 2017-09-26 VITALS — BP 157/77 | HR 80 | Resp 13 | Ht 62.0 in | Wt 172.0 lb

## 2017-09-26 DIAGNOSIS — I6523 Occlusion and stenosis of bilateral carotid arteries: Secondary | ICD-10-CM | POA: Diagnosis not present

## 2017-09-26 DIAGNOSIS — I10 Essential (primary) hypertension: Secondary | ICD-10-CM

## 2017-09-26 DIAGNOSIS — Z794 Long term (current) use of insulin: Secondary | ICD-10-CM | POA: Diagnosis not present

## 2017-09-26 DIAGNOSIS — E1140 Type 2 diabetes mellitus with diabetic neuropathy, unspecified: Secondary | ICD-10-CM

## 2017-09-26 DIAGNOSIS — G459 Transient cerebral ischemic attack, unspecified: Secondary | ICD-10-CM

## 2017-09-26 NOTE — Assessment & Plan Note (Signed)
blood glucose control important in reducing the progression of atherosclerotic disease. Also, involved in wound healing. On appropriate medications.  

## 2017-09-26 NOTE — Assessment & Plan Note (Signed)
Likely from carotid disease 

## 2017-09-26 NOTE — Progress Notes (Signed)
Patient ID: Judy Harvey, female   DOB: 06/18/1927, 84 y.o.   MRN: [id number]  Chief Complaint  Patient presents with  . New Patient (Initial Visit)    Carotid stenosis    HPI Judy Harvey is a 84 y.o. female.  I am asked to see the patient by Dr. Yetta Barre for evaluation of carotid stenosis.  The patient reports multiple episodes of a aphasia and syncope or near syncope.  She reports an episode when she was at [facility] about 4 months ago.  This resulted in extensive work-up that I have been able to review today including a CT angiogram which shows a right carotid artery occlusion and an official reading of an 80% left carotid artery stenosis.  At that time, she was started on dual antiplatelet and statin therapy.  She was referred to see [place] back in May but did not show up for her appointment.  She presents now for further evaluation.  She continues to have episodes of a aphasia and confusion.  She had an echocardiogram which was essentially unrevealing 4 months ago.   Past Medical History:  Diagnosis Date  . Depression   . Diabetes mellitus without complication (HCC)   . Hyperlipidemia   . Hypertension   . Legally blind   . Peripheral neuropathy   . Stroke [facility])     Past Surgical History:  Procedure Laterality Date  . ABDOMINAL HYSTERECTOMY     partial  . APPENDECTOMY    . BREAST SURGERY     reduction  . CESAREAN SECTION     x3    Family History  Problem Relation Age of Onset  . Healthy Mother   . Diabetes Father   . Alzheimer's disease Father   . Multiple sclerosis Sister   . Multiple sclerosis Brother     Social History Social History   Tobacco Use  . Smoking status: Former Smoker    Packs/day: 3.00    Years: 50.00    Pack years: 150.00    Types: Cigarettes    Last attempt to quit: 2008    Years since quitting: 11.5  . Smokeless tobacco: Never Used  . Tobacco comment: smoking cessation materials not required  Substance Use Topics  . Alcohol  use: No  . Drug use: No     Allergies  Allergen Reactions  . Lyrica [Pregabalin]     Body spasms    Current Outpatient Medications  Medication Sig Dispense Refill  . Acetaminophen (TYLENOL) 325 MG CAPS Take by mouth.    Marland Kitchen aspirin EC 81 MG tablet Take 81 mg by mouth daily.    Marland Kitchen atorvastatin (LIPITOR) 80 MG tablet Take 1 tablet (80 mg total) by mouth at bedtime. 90 tablet 1  . Cholecalciferol (VITAMIN D3) 5000 units CAPS Take 1 capsule (5,000 Units total) by mouth daily. 30 capsule   . clopidogrel (PLAVIX) 75 MG tablet TAKE 1 TABLET BY MOUTH DAILY 30 tablet 0  . DULoxetine (CYMBALTA) 30 MG capsule Take 1 capsule (30 mg total) by mouth daily. 90 capsule 3  . gabapentin (NEURONTIN) 300 MG capsule TAKE 1 CAPSULE BY MOUTH THREE TIMES A DAY 270 capsule 0  . Insulin Glargine (LANTUS SOLOSTAR) 100 UNIT/ML Solostar Pen Inject 50 Units into the skin daily at 10 pm. 5 pen 3  . Insulin Pen Needle 31G X 5 MM MISC Use with insulin pen to inject once daily. 30 each 3  . Insulin Syringe-Needle U-100 (B-D INS SYR ULTRAFINE .  3CC/31G) 31G X 5/16" 0.3 ML MISC Use to administer insulin daily to control diabetes. 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 100 each 3  . lisinopril (PRINIVIL,ZESTRIL) 10 MG tablet Take 1 tablet (10 mg total) by mouth daily. 90 tablet 1  . metFORMIN (GLUCOPHAGE-XR) 500 MG 24 hr tablet Take 2 tablets (1,000 mg total) by mouth daily with breakfast. 180 tablet 1  . ondansetron (ZOFRAN) 8 MG tablet Take 0.5 tablets (4 mg total) by mouth every 8 (eight) hours as needed for nausea. 90 tablet 0  . senna-docusate (SENOKOT S) 8.6-50 MG tablet Take 1 tablet by mouth daily as needed for mild constipation.     No current facility-administered medications for this visit.       REVIEW OF SYSTEMS (Negative unless checked)  Constitutional: [] Weight loss  [] Fever  [] Chills Cardiac: [] Chest pain   [] Chest pressure   [] Palpitations   [] Shortness of breath when  laying flat   [] Shortness of breath at rest   [] Shortness of breath with exertion. Vascular:  [] Pain in legs with walking   [] Pain in legs at rest   [] Pain in legs when laying flat   [] Claudication   [] Pain in feet when walking  [] Pain in feet at rest  [] Pain in feet when laying flat   [] History of DVT   [] Phlebitis   [] Swelling in legs   [] Varicose veins   [] Non-healing ulcers Pulmonary:   [] Uses home oxygen   [] Productive cough   [] Hemoptysis   [] Wheeze  [] COPD   [] Asthma Neurologic:  [] Dizziness  [] Blackouts   [] Seizures   [x] History of stroke   [x] History of TIA  [x] Aphasia   [] Temporary blindness   [] Dysphagia   [] Weakness or numbness in arms   [] Weakness or numbness in legs Musculoskeletal:  [x] Arthritis   [] Joint swelling   [] Joint pain   [] Low back pain Hematologic:  [] Easy bruising  [] Easy bleeding   [] Hypercoagulable state   [] Anemic  [] Hepatitis Gastrointestinal:  [] Blood in stool   [] Vomiting blood  [] Gastroesophageal reflux/heartburn   [] Abdominal pain Genitourinary:  [] Chronic kidney disease   [] Difficult urination  [] Frequent urination  [] Burning with urination   [] Hematuria Skin:  [] Rashes   [] Ulcers   [] Wounds Psychological:  [] History of anxiety   []  History of major depression.    Physical Exam BP (!) 157/77 (BP Location: Right Arm, Patient Position: Sitting)   Pulse 80   Resp 13   Ht 5\' 2"  (1.575 m)   Wt 172 lb (78 kg)   BMI 31.46 kg/m  Gen:  WD/WN, NAD.  Appears younger than stated age Head: [place]/AT, No temporalis wasting.  Ear/Nose/Throat: Hearing diminished, nares w/o erythema or drainage, oropharynx w/o Erythema/Exudate Eyes: Conjunctiva clear, sclera non-icteric.  Visual acuity markedly diminished Neck: trachea midline.  Bilateral bruits Pulmonary:  Good air movement, respirations not labored, no use of accessory muscles Cardiac: RRR, no JVD Vascular:  Vessel Right Left  Radial Palpable Palpable                                   Gastrointestinal: soft,  non-tender/non-distended.  Musculoskeletal: M/S 5/5 throughout.  Extremities without ischemic changes.  No deformity or atrophy. No edema. Neurologic: Sensation grossly intact in extremities.  Symmetrical.  Speech is fluent. Motor exam as listed above. Psychiatric: Judgment intact, Mood & affect appropriate for pt's clinical situation. Dermatologic: No rashes or ulcers noted.  No cellulitis or open  wounds.    Radiology No results found.  Labs No results found for this or any previous visit (from the past 2160 hour(s)).  Assessment/Plan:  Hypertension blood pressure control important in reducing the progression of atherosclerotic disease. On appropriate oral medications.   Controlled type 2 diabetes mellitus with diabetic neuropathy (HCC) blood glucose control important in reducing the progression of atherosclerotic disease. Also, involved in wound healing. On appropriate medications.   TIA (transient ischemic attack) Likely from carotid disease.   Carotid stenosis I have independently reviewed her CT angiogram and would generally agree with an approximately 80% left ICA stenosis at and just below the carotid bifurcation.  The more distal internal carotid artery is reasonably normal.  The right carotid artery is occluded.  In general, with an 80% symptomatic lesion and a contralateral occlusion, carotid artery stenting would be the primary treatment modality.  I would agree with continued dual antiplatelet and statin therapy.  The patient and her son are going to discuss whether or not they want to have this done and will call our office if they choose to proceed.  Her anatomy does appear to be potentially amenable to endovascular carotid therapy if she so chooses.      Festus Barren 09/26/2017, 4:50 PM   This note was created with Dragon medical transcription system.  Any errors from dictation are unintentional.

## 2017-09-26 NOTE — Assessment & Plan Note (Signed)
blood pressure control important in reducing the progression of atherosclerotic disease. On appropriate oral medications.  

## 2017-09-26 NOTE — Patient Instructions (Signed)
Carotid Angioplasty With Stent Carotid angioplasty is a surgery to widen or open an artery in the neck (carotid artery). The surgery is done by placing a small piece of metal (stent) into the artery. The stent helps to keep the artery open so that blood flows to the brain. This surgery is done when an artery gets blocked or gets too narrow. What happens before the procedure?  Ask your doctor about: ? Changing or stopping your normal medicines. This is especially important if you take diabetes medicines or blood thinners. ? Taking medicines such as aspirin and ibuprofen. These medicines can thin your blood. Do not take these medicines before your procedure if your doctor tells you not to.  Follow instructions from your doctor about what you cannot eat or drink.  Do not use any tobacco products for at least 24 hours before your procedure. This includes cigarettes, chewing tobacco, or e-cigarettes.  Ask your doctor how your surgical area will be marked or identified.  You may be given antibiotic medicine to help prevent infection.  You may have blood tests done.  Plan to have someone take you home after the procedure.  If you go home right away, plan to have someone with you for 24 hours. What happens during the procedure?  To reduce your risk of infection: ? Your health care team will wash or sanitize their hands. ? Your skin will be washed with soap.  An IV tube will be put into one of your veins.  You will be given one or more of the following: ? A medicine to help you relax (sedative). ? A medicine to make you fall asleep (general anesthetic).  A cut (incision) will be made. Usually the cut will be in your groin. Sometimes, the cut may be in your wrist or forearm instead.  A tube (catheter) will be put through your cut. The tube will be moved into your artery. An X-ray machine (fluoroscope) will be used to help with this.  Dye will be put into the tube. The dye will travel to  the narrow or blocked part of your artery.  X-rays will be done. These will show where your artery is narrow or blocked. You may be given instructions about breathing, swallowing, moving, or talking during X-rays.  A filter (distal protection device) will be put into your artery. This will trap buildup that comes loose.  A small balloon will be put into your artery. It will be blown up to widen your artery.  The balloon will be removed.  The stent will be placed in your artery.  A small balloon will be put into your artery. It will be blown up to expand the stent.  The balloon will be removed.  The tube and filter will be removed.  Your cut may be closed with stitches (sutures), skin glue, or skin tape (adhesive) strips.  A bandage (dressing) will be placed over your cut. The procedure may vary among doctors and hospitals. What happens after the procedure?  You will be monitored often until your medicines have worn off.  You may continue to get fluids and medicines through an IV tube.  You may have some pain. There will be medicine to help you.  You may have X-rays. These will make sure that the stent is in the right place.  You may have to wear compression stockings. These help to prevent blood clots. They also help to reduce swelling in your legs.  Do not drive for 24  hours if you received a medicine to help you relax. This information is not intended to replace advice given to you by your health care provider. Make sure you discuss any questions you have with your health care provider. Document Released: 03/05/2013 Document Revised: 08/06/2015 Document Reviewed: 11/23/2014 Elsevier Interactive Patient Education  [employer]2018 [employer]

## 2017-09-26 NOTE — Assessment & Plan Note (Signed)
I have independently reviewed her CT angiogram and would generally agree with an approximately 80% left ICA stenosis at and just below the carotid bifurcation.  The more distal internal carotid artery is reasonably normal.  The right carotid artery is occluded.  In general, with an 80% symptomatic lesion and a contralateral occlusion, carotid artery stenting would be the primary treatment modality.  I would agree with continued dual antiplatelet and statin therapy.  The patient and her son are going to discuss whether or not they want to have this done and will call our office if they choose to proceed.  Her anatomy does appear to be potentially amenable to endovascular carotid therapy if she so chooses.

## 2017-09-27 ENCOUNTER — Other Ambulatory Visit: Payer: Self-pay | Admitting: Family Medicine

## 2017-09-29 ENCOUNTER — Encounter (INDEPENDENT_AMBULATORY_CARE_PROVIDER_SITE_OTHER): Payer: Self-pay

## 2017-10-03 ENCOUNTER — Encounter (INDEPENDENT_AMBULATORY_CARE_PROVIDER_SITE_OTHER): Payer: Self-pay

## 2017-10-11 ENCOUNTER — Telehealth (INDEPENDENT_AMBULATORY_CARE_PROVIDER_SITE_OTHER): Payer: Self-pay

## 2017-10-11 NOTE — Telephone Encounter (Signed)
Patient called wanting to know if Dr. Wyn Quakerew could do her stent in her arm because " she can't see her groin ".  I explained that for a carotid stent placement the doctor usually goes through the groin and she stated yes she understood that because he told her.

## 2017-10-16 ENCOUNTER — Other Ambulatory Visit (INDEPENDENT_AMBULATORY_CARE_PROVIDER_SITE_OTHER): Payer: Self-pay | Admitting: Vascular Surgery

## 2017-10-17 ENCOUNTER — Encounter
Admission: RE | Admit: 2017-10-17 | Discharge: 2017-10-17 | Disposition: A | Discharge status: Home / Self Care | Payer: Medicare Other | Source: Ambulatory Visit | Attending: Vascular Surgery | Admitting: Vascular Surgery

## 2017-10-17 ENCOUNTER — Other Ambulatory Visit: Payer: Self-pay

## 2017-10-17 ENCOUNTER — Other Ambulatory Visit (INDEPENDENT_AMBULATORY_CARE_PROVIDER_SITE_OTHER): Payer: Self-pay | Admitting: Vascular Surgery

## 2017-10-17 DIAGNOSIS — Z01810 Encounter for preprocedural cardiovascular examination: Secondary | ICD-10-CM | POA: Diagnosis not present

## 2017-10-17 HISTORY — DX: Unspecified macular degeneration: H35.30

## 2017-10-17 HISTORY — DX: Gastro-esophageal reflux disease without esophagitis: K21.9

## 2017-10-17 LAB — CBC WITH DIFFERENTIAL/PLATELET
BASOS ABS: 0.1 10*3/uL (ref 0–0.1)
BASOS PCT: 1 %
EOS ABS: 0.2 10*3/uL (ref 0–0.7)
Eosinophils Relative: 2 %
HEMATOCRIT: 40.8 % (ref 35.0–47.0)
Hemoglobin: 13.7 g/dL (ref 12.0–16.0)
Lymphocytes Relative: 14 %
Lymphs Abs: 1.5 10*3/uL (ref 1.0–3.6)
MCH: 31.2 pg (ref 26.0–34.0)
MCHC: 33.6 g/dL (ref 32.0–36.0)
MCV: 92.8 fL (ref 80.0–100.0)
MONO ABS: 0.7 10*3/uL (ref 0.2–0.9)
Monocytes Relative: 6 %
NEUTROS ABS: 8.3 10*3/uL — AB (ref 1.4–6.5)
NEUTROS PCT: 77 %
PLATELETS: 273 10*3/uL (ref 150–440)
RBC: 4.4 MIL/uL (ref 3.80–5.20)
RDW: 13.8 % (ref 11.5–14.5)
WBC: 10.8 10*3/uL (ref 3.6–11.0)

## 2017-10-17 LAB — PROTIME-INR
INR: 1
PROTHROMBIN TIME: 13.1 s (ref 11.4–15.2)

## 2017-10-17 LAB — BASIC METABOLIC PANEL
ANION GAP: 10 (ref 5–15)
BUN: 21 mg/dL (ref 8–23)
CALCIUM: 9.1 mg/dL (ref 8.9–10.3)
CO2: 29 mmol/L (ref 22–32)
CREATININE: 0.78 mg/dL (ref 0.44–1.00)
Chloride: 103 mmol/L (ref 98–111)
GFR calc non Af Amer: >60 mL/min (ref >60)
GLUCOSE: 113 mg/dL — AB (ref 70–99)
Potassium: 4.3 mmol/L (ref 3.5–5.1)
Sodium: 142 mmol/L (ref 135–145)

## 2017-10-17 LAB — TYPE AND SCREEN
ABO/RH(D): A POS
Antibody Screen: NEGATIVE

## 2017-10-17 LAB — APTT: APTT: 35 s (ref 24–36)

## 2017-10-17 MED ORDER — CEFAZOLIN SODIUM-DEXTROSE 2-4 GM/100ML-% IV SOLN
2.0000 g | INTRAVENOUS | Status: AC
  Administered 2017-10-18: 2 g via INTRAVENOUS

## 2017-10-17 NOTE — Patient Instructions (Signed)
Your procedure is scheduled on: 10/18/17 Report to Hospital as Instructed by Dr Driscilla Grammesew's Office .  Remember: Instructions that are not followed completely may result in serious medical risk, up to and including death, or upon the discretion of your surgeon and anesthesiologist your surgery may need to be rescheduled.     _X__ 1. Do not eat food after midnight the night before your procedure.                                                     __X__2.  On the morning of surgery brush your teeth with toothpaste and water, you                 may rinse your mouth with mouthwash if you wish.  Do not swallow any              toothpaste of mouthwash.     _X__ 3.  No Alcohol for 24 hours before or after surgery.   _X__ 4.  Do Not Smoke or use e-cigarettes For 24 Hours Prior to Your Surgery.                 Do not use any chewable tobacco products for at least 6 hours prior to                 surgery.  ____  5.  Bring all medications with you on the day of surgery if instructed.   __X__  6.  Notify your doctor if there is any change in your medical condition      (cold, fever, infections).     Do not wear jewelry, make-up, hairpins, clips or nail polish. Do not wear lotions, powders, or perfumes.  Do not shave 48 hours prior to surgery. Men may shave face and neck. Do not bring valuables to the hospital.    [facility] is not responsible for any belongings or valuables.  Contacts, dentures/partials or body piercings may not be worn into surgery. Bring a case for your contacts, glasses or hearing aids, a denture cup will be supplied. Leave your suitcase in the car. After surgery it may be brought to your room. For patients admitted to the hospital, discharge time is determined by your treatment team.   Patients discharged the day of surgery will not be allowed to drive home.   Please read over the following fact sheets that you were given:   MRSA Information  __X__ Take these medicines the  morning of surgery with A SIP OF WATER:    1. Take medications as instructed by Dr Driscilla Grammesew's Staff  2.   3.   4.  5.  6.  ____ Fleet Enema (as directed)   ____ Use CHG Soap/SAGE wipes as directed  ____ Use inhalers on the day of surgery  ____ Stop metformin/Janumet/Farxiga 2 days prior to surgery    ____ Take 1/2 of usual insulin dose the night before surgery. No insulin the morning          of surgery.   ____ Stop Blood Thinners Coumadin/Plavix/Xarelto/Pleta/Pradaxa/Eliquis/Effient/Aspirin  on   Or contact your Surgeon, Cardiologist or Medical Doctor regarding  ability to stop your blood thinners  ____ Stop Anti-inflammatories 7 days before surgery such as Advil, Ibuprofen, Motrin,  BC or Goodies Powder, Naprosyn, Naproxen, Aleve, Aspirin  ____ Stop all herbal supplements, fish oil or vitamin E until after surgery.    ____ Bring C-Pap to the hospital.

## 2017-10-18 ENCOUNTER — Encounter
Admission: AD | Disposition: A | Discharge status: Home / Self Care | Payer: Self-pay | Source: Ambulatory Visit | Attending: Vascular Surgery

## 2017-10-18 ENCOUNTER — Inpatient Hospital Stay
Admission: AD | Admit: 2017-10-18 | Discharge: 2017-10-19 | DRG: 036 | Disposition: A | Discharge status: Home / Self Care | Payer: Medicare Other | Source: Ambulatory Visit | Attending: Vascular Surgery | Admitting: Vascular Surgery

## 2017-10-18 ENCOUNTER — Other Ambulatory Visit (INDEPENDENT_AMBULATORY_CARE_PROVIDER_SITE_OTHER): Payer: Self-pay | Admitting: Vascular Surgery

## 2017-10-18 ENCOUNTER — Encounter: Payer: Self-pay | Admitting: *Deleted

## 2017-10-18 ENCOUNTER — Other Ambulatory Visit: Payer: Medicare Other

## 2017-10-18 DIAGNOSIS — Z7902 Long term (current) use of antithrombotics/antiplatelets: Secondary | ICD-10-CM | POA: Diagnosis not present

## 2017-10-18 DIAGNOSIS — Z7982 Long term (current) use of aspirin: Secondary | ICD-10-CM

## 2017-10-18 DIAGNOSIS — Z820 Family history of epilepsy and other diseases of the nervous system: Secondary | ICD-10-CM | POA: Diagnosis not present

## 2017-10-18 DIAGNOSIS — H548 Legal blindness, as defined in USA: Secondary | ICD-10-CM | POA: Diagnosis present

## 2017-10-18 DIAGNOSIS — E785 Hyperlipidemia, unspecified: Secondary | ICD-10-CM | POA: Diagnosis present

## 2017-10-18 DIAGNOSIS — I6522 Occlusion and stenosis of left carotid artery: Secondary | ICD-10-CM | POA: Diagnosis present

## 2017-10-18 DIAGNOSIS — Z8673 Personal history of transient ischemic attack (TIA), and cerebral infarction without residual deficits: Secondary | ICD-10-CM

## 2017-10-18 DIAGNOSIS — I6523 Occlusion and stenosis of bilateral carotid arteries: Principal | ICD-10-CM | POA: Diagnosis present

## 2017-10-18 DIAGNOSIS — Z90710 Acquired absence of both cervix and uterus: Secondary | ICD-10-CM

## 2017-10-18 DIAGNOSIS — E1142 Type 2 diabetes mellitus with diabetic polyneuropathy: Secondary | ICD-10-CM | POA: Diagnosis present

## 2017-10-18 DIAGNOSIS — Z833 Family history of diabetes mellitus: Secondary | ICD-10-CM

## 2017-10-18 DIAGNOSIS — H3530 Unspecified macular degeneration: Secondary | ICD-10-CM | POA: Diagnosis present

## 2017-10-18 DIAGNOSIS — Z794 Long term (current) use of insulin: Secondary | ICD-10-CM

## 2017-10-18 DIAGNOSIS — K219 Gastro-esophageal reflux disease without esophagitis: Secondary | ICD-10-CM | POA: Diagnosis present

## 2017-10-18 DIAGNOSIS — I10 Essential (primary) hypertension: Secondary | ICD-10-CM | POA: Diagnosis present

## 2017-10-18 DIAGNOSIS — Z87891 Personal history of nicotine dependence: Secondary | ICD-10-CM | POA: Diagnosis not present

## 2017-10-18 HISTORY — PX: CAROTID PTA/STENT INTERVENTION: CATH118231

## 2017-10-18 LAB — GLUCOSE, CAPILLARY
GLUCOSE-CAPILLARY: 101 mg/dL — AB (ref 70–99)
Glucose-Capillary: 126 mg/dL — ABNORMAL HIGH (ref 70–99)
Glucose-Capillary: 107 mg/dL — ABNORMAL HIGH (ref 70–99)

## 2017-10-18 LAB — POCT ACTIVATED CLOTTING TIME: ACTIVATED CLOTTING TIME: 318 s

## 2017-10-18 LAB — MRSA PCR SCREENING: MRSA by PCR: NEGATIVE

## 2017-10-18 SURGERY — CAROTID PTA/STENT INTERVENTION
Anesthesia: Moderate Sedation | Laterality: Left

## 2017-10-18 MED ORDER — ACETAMINOPHEN 325 MG RE SUPP
325.0000 mg | RECTAL | Status: DC | PRN
  Filled 2017-10-18: qty 2

## 2017-10-18 MED ORDER — FENTANYL CITRATE (PF) 100 MCG/2ML IJ SOLN
INTRAMUSCULAR | Status: DC | PRN
  Administered 2017-10-18 (×4): 25 ug via INTRAVENOUS

## 2017-10-18 MED ORDER — CHLORHEXIDINE GLUCONATE CLOTH 2 % EX PADS: 6.0000 | MEDICATED_PAD | Freq: Once | CUTANEOUS | Status: DC

## 2017-10-18 MED ORDER — VITAMIN D 1000 UNITS PO TABS
5000.0000 [IU] | ORAL_TABLET | Freq: Every day | ORAL | Status: DC
  Administered 2017-10-18 – 2017-10-19 (×2): 5000 [IU] via ORAL
  Filled 2017-10-18: qty 5

## 2017-10-18 MED ORDER — OXYCODONE-ACETAMINOPHEN 5-325 MG PO TABS
1.0000 | ORAL_TABLET | ORAL | Status: DC | PRN
  Administered 2017-10-18: 1 via ORAL
  Filled 2017-10-18: qty 1

## 2017-10-18 MED ORDER — INSULIN GLARGINE 100 UNIT/ML ~~LOC~~ SOLN
50.0000 [IU] | Freq: Every day | SUBCUTANEOUS | Status: DC
  Filled 2017-10-18 (×2): qty 0.5

## 2017-10-18 MED ORDER — SODIUM CHLORIDE 0.9 % IV SOLN
500.0000 mL | Freq: Once | INTRAVENOUS | Status: AC | PRN
  Administered 2017-10-18: 500 mL via INTRAVENOUS

## 2017-10-18 MED ORDER — ACETAMINOPHEN 325 MG PO TABS
325.0000 mg | ORAL_TABLET | ORAL | Status: DC | PRN
  Administered 2017-10-18: 325 mg via ORAL
  Filled 2017-10-18: qty 2

## 2017-10-18 MED ORDER — MAGNESIUM SULFATE 2 GM/50ML IV SOLN: 2.0000 g | Freq: Every day | INTRAVENOUS | Status: DC | PRN

## 2017-10-18 MED ORDER — PHENYLEPHRINE HCL 10 MG/ML IJ SOLN
INTRAMUSCULAR | Status: AC
  Filled 2017-10-18: qty 1

## 2017-10-18 MED ORDER — MIDAZOLAM HCL 2 MG/2ML IJ SOLN
INTRAMUSCULAR | Status: DC | PRN
  Administered 2017-10-18 (×4): 1 mg via INTRAVENOUS

## 2017-10-18 MED ORDER — HYDRALAZINE HCL 20 MG/ML IJ SOLN: 5.0000 mg | INTRAMUSCULAR | Status: DC | PRN

## 2017-10-18 MED ORDER — HEPARIN (PORCINE) IN NACL 1000-0.9 UT/500ML-% IV SOLN
INTRAVENOUS | Status: AC
  Filled 2017-10-18: qty 1000

## 2017-10-18 MED ORDER — HEPARIN SODIUM (PORCINE) 1000 UNIT/ML IJ SOLN
INTRAMUSCULAR | Status: DC | PRN
  Administered 2017-10-18: 7000 [IU] via INTRAVENOUS
  Administered 2017-10-18: 1000 [IU] via INTRAVENOUS

## 2017-10-18 MED ORDER — CEFAZOLIN SODIUM-DEXTROSE 2-4 GM/100ML-% IV SOLN
2.0000 g | Freq: Three times a day (TID) | INTRAVENOUS | Status: AC
  Administered 2017-10-18 – 2017-10-19 (×2): 2 g via INTRAVENOUS
  Filled 2017-10-18 (×2): qty 100

## 2017-10-18 MED ORDER — IOPAMIDOL (ISOVUE-300) INJECTION 61%
INTRAVENOUS | Status: DC | PRN
  Administered 2017-10-18: 60 mL via INTRA_ARTERIAL

## 2017-10-18 MED ORDER — CLOPIDOGREL BISULFATE 75 MG PO TABS
75.0000 mg | ORAL_TABLET | Freq: Every day | ORAL | Status: DC
  Administered 2017-10-18 – 2017-10-19 (×2): 75 mg via ORAL
  Filled 2017-10-18: qty 1

## 2017-10-18 MED ORDER — HYDROMORPHONE HCL 1 MG/ML IJ SOLN
INTRAMUSCULAR | Status: AC
  Administered 2017-10-18: 0.5 mg via INTRAVENOUS
  Filled 2017-10-18: qty 0.5

## 2017-10-18 MED ORDER — CEFAZOLIN SODIUM-DEXTROSE 2-4 GM/100ML-% IV SOLN
INTRAVENOUS | Status: AC
  Administered 2017-10-18: 2 g via INTRAVENOUS
  Filled 2017-10-18: qty 100

## 2017-10-18 MED ORDER — DULOXETINE HCL 30 MG PO CPEP
30.0000 mg | ORAL_CAPSULE | Freq: Every day | ORAL | Status: DC
  Administered 2017-10-18 – 2017-10-19 (×2): 30 mg via ORAL
  Filled 2017-10-18: qty 1

## 2017-10-18 MED ORDER — METOPROLOL TARTRATE 5 MG/5ML IV SOLN: 2.0000 mg | INTRAVENOUS | Status: DC | PRN

## 2017-10-18 MED ORDER — LIDOCAINE-EPINEPHRINE (PF) 1 %-1:200000 IJ SOLN
INTRAMUSCULAR | Status: AC
  Filled 2017-10-18: qty 30

## 2017-10-18 MED ORDER — GUAIFENESIN-DM 100-10 MG/5ML PO SYRP
15.0000 mL | ORAL_SOLUTION | ORAL | Status: DC | PRN
  Filled 2017-10-18: qty 15

## 2017-10-18 MED ORDER — ATROPINE SULFATE 1 MG/10ML IJ SOSY
PREFILLED_SYRINGE | INTRAMUSCULAR | Status: AC
  Filled 2017-10-18: qty 10

## 2017-10-18 MED ORDER — SODIUM CHLORIDE 0.9 % IV SOLN
INTRAVENOUS | Status: DC
  Administered 2017-10-18: 09:00:00 via INTRAVENOUS

## 2017-10-18 MED ORDER — SODIUM CHLORIDE 0.9 % IV SOLN
INTRAVENOUS | Status: DC
  Administered 2017-10-18 – 2017-10-19 (×3): via INTRAVENOUS

## 2017-10-18 MED ORDER — METFORMIN HCL ER 500 MG PO TB24
1000.0000 mg | ORAL_TABLET | Freq: Every day | ORAL | Status: DC
  Filled 2017-10-18: qty 2

## 2017-10-18 MED ORDER — HYDROMORPHONE HCL 1 MG/ML IJ SOLN
0.5000 mg | Freq: Once | INTRAMUSCULAR | Status: AC
  Administered 2017-10-18: 0.5 mg via INTRAVENOUS

## 2017-10-18 MED ORDER — MORPHINE SULFATE (PF) 4 MG/ML IV SOLN: 2.0000 mg | INTRAVENOUS | Status: DC | PRN

## 2017-10-18 MED ORDER — HEPARIN SODIUM (PORCINE) 1000 UNIT/ML IJ SOLN
INTRAMUSCULAR | Status: AC
  Filled 2017-10-18: qty 1

## 2017-10-18 MED ORDER — ATROPINE SULFATE 0.4 MG/ML IJ SOLN
INTRAMUSCULAR | Status: DC | PRN
  Administered 2017-10-18: 0.4 mg via INTRAVENOUS

## 2017-10-18 MED ORDER — CHLORHEXIDINE GLUCONATE CLOTH 2 % EX PADS
6.0000 | MEDICATED_PAD | Freq: Once | CUTANEOUS | Status: DC
  Administered 2017-10-18: 6 via TOPICAL

## 2017-10-18 MED ORDER — MIDAZOLAM HCL 5 MG/5ML IJ SOLN
INTRAMUSCULAR | Status: AC
  Filled 2017-10-18: qty 5

## 2017-10-18 MED ORDER — LABETALOL HCL 5 MG/ML IV SOLN: 10.0000 mg | INTRAVENOUS | Status: DC | PRN

## 2017-10-18 MED ORDER — PHENOL 1.4 % MT LIQD
1.0000 | OROMUCOSAL | Status: DC | PRN
  Filled 2017-10-18: qty 177

## 2017-10-18 MED ORDER — ALUM & MAG HYDROXIDE-SIMETH 200-200-20 MG/5ML PO SUSP
15.0000 mL | ORAL | Status: DC | PRN
  Filled 2017-10-18: qty 30

## 2017-10-18 MED ORDER — FAMOTIDINE IN NACL 20-0.9 MG/50ML-% IV SOLN
20.0000 mg | Freq: Two times a day (BID) | INTRAVENOUS | Status: DC
  Administered 2017-10-18 (×2): 20 mg via INTRAVENOUS
  Filled 2017-10-18: qty 50

## 2017-10-18 MED ORDER — ASPIRIN EC 81 MG PO TBEC
81.0000 mg | DELAYED_RELEASE_TABLET | Freq: Every day | ORAL | Status: DC
  Administered 2017-10-19: 81 mg via ORAL
  Filled 2017-10-18: qty 1

## 2017-10-18 MED ORDER — SENNOSIDES-DOCUSATE SODIUM 8.6-50 MG PO TABS
1.0000 | ORAL_TABLET | Freq: Every day | ORAL | Status: DC
  Filled 2017-10-18: qty 1

## 2017-10-18 MED ORDER — ONDANSETRON HCL 4 MG/2ML IJ SOLN
4.0000 mg | Freq: Four times a day (QID) | INTRAMUSCULAR | Status: DC | PRN
  Administered 2017-10-18 – 2017-10-19 (×2): 4 mg via INTRAVENOUS
  Filled 2017-10-18 (×3): qty 2

## 2017-10-18 MED ORDER — SODIUM CHLORIDE FLUSH 0.9 % IV SOLN
INTRAVENOUS | Status: AC
Start: 2017-10-18 — End: ?
  Filled 2017-10-18: qty 20

## 2017-10-18 MED ORDER — LISINOPRIL 10 MG PO TABS
10.0000 mg | ORAL_TABLET | Freq: Every day | ORAL | Status: DC
  Filled 2017-10-18 (×2): qty 1

## 2017-10-18 MED ORDER — POTASSIUM CHLORIDE CRYS ER 20 MEQ PO TBCR: 20.0000 meq | EXTENDED_RELEASE_TABLET | Freq: Every day | ORAL | Status: DC | PRN

## 2017-10-18 MED ORDER — ATORVASTATIN CALCIUM 20 MG PO TABS
80.0000 mg | ORAL_TABLET | Freq: Every day | ORAL | Status: DC
  Administered 2017-10-18: 80 mg via ORAL
  Filled 2017-10-18: qty 4

## 2017-10-18 MED ORDER — FENTANYL CITRATE (PF) 100 MCG/2ML IJ SOLN
INTRAMUSCULAR | Status: AC
Start: 2017-10-18 — End: ?
  Filled 2017-10-18: qty 2

## 2017-10-18 MED ORDER — GABAPENTIN 100 MG PO CAPS
100.0000 mg | ORAL_CAPSULE | Freq: Two times a day (BID) | ORAL | Status: DC
  Administered 2017-10-18 – 2017-10-19 (×3): 100 mg via ORAL
  Filled 2017-10-18 (×2): qty 1

## 2017-10-18 SURGICAL SUPPLY — 32 items
BALLN VIATRAC 5.5X20X135 (BALLOONS) ×2
BALLN VIATRAC 5.5X20X135CM (BALLOONS) ×1
BALLOON VIATRAC 5.5X20X135 (BALLOONS) ×1 IMPLANT
CATH 4FR 100 MARINER ST (CATHETERS) ×1 IMPLANT
CATH 4FR 100CM MARINER ST (CATHETERS) ×2
CATH ANGIO 5F 100CM .035 PIG (CATHETERS) ×3 IMPLANT
CATH BEACON 5 .035 100 SIM1 TP (CATHETERS) ×3 IMPLANT
CATH G 5FX100 (CATHETERS) ×3 IMPLANT
CATH VTK 5FR 125CM BEACON TIP (CATHETERS) ×3 IMPLANT
DEVICE EMBOSHIELD NAV6 4.0-7.0 (FILTER) ×3 IMPLANT
DEVICE PRESTO INFLATION (MISCELLANEOUS) ×3 IMPLANT
DEVICE SAFEGUARD 24CM (GAUZE/BANDAGES/DRESSINGS) ×3 IMPLANT
DEVICE STARCLOSE SE CLOSURE (Vascular Products) ×3 IMPLANT
DEVICE TORQUE .025-.038 (MISCELLANEOUS) ×6 IMPLANT
GAUZE SPONGE 4X4 12PLY STRL (GAUZE/BANDAGES/DRESSINGS) ×3 IMPLANT
GLIDECATH F4/38/100ST (CATHETERS) ×3 IMPLANT
GLIDEWIRE ADV .035X260CM (WIRE) ×3 IMPLANT
GUIDEWIRE ANGLED .035X260CM (WIRE) ×3 IMPLANT
KIT CAROTID MANIFOLD (MISCELLANEOUS) ×3 IMPLANT
PACK ANGIOGRAPHY (CUSTOM PROCEDURE TRAY) ×3 IMPLANT
SHEATH BRITE TIP 6FRX11 (SHEATH) ×3 IMPLANT
SHEATH SHUTTLE 6FRX80 (SHEATH) ×3 IMPLANT
STENT XACT CAR 10-8X40X136 (Permanent Stent) ×3 IMPLANT
SYR MEDRAD MARK V 150ML (SYRINGE) ×3 IMPLANT
TOWEL OR 17X26 4PK STRL BLUE (TOWEL DISPOSABLE) ×3 IMPLANT
TUBING CONTRAST HIGH PRESS 72 (TUBING) ×3 IMPLANT
WIRE AMPLATZ SSTIFF .035X260CM (WIRE) ×3 IMPLANT
WIRE AQUATRACK .035X260CM (WIRE) ×3 IMPLANT
WIRE BAREWIRE WORK .014X315CM (WIRE) ×3 IMPLANT
WIRE G VAS 035X260 STIFF (WIRE) ×3 IMPLANT
WIRE J 3MM .035X145CM (WIRE) ×3 IMPLANT
WIRE MAGIC TORQUE 260C (WIRE) ×3 IMPLANT

## 2017-10-18 NOTE — Progress Notes (Signed)
   10/18/17 1645  Clinical Encounter Type  Visited With Patient  Visit Type Follow-up;Spiritual support;Post-op  Referral From Nurse  Consult/Referral To Chaplain  Spiritual Encounters  Spiritual Needs Prayer   After meeting [name] in pre-admin yesterday, I followed up on her after a procedure she had earlier in the day. [name] was resting and must lay flat after her procedure so I prayed outside of her room and will follow up later this evening.

## 2017-10-18 NOTE — Progress Notes (Signed)
This RN had gone in to check on patient. Looked at vascular access site, and found site to had bled through PAD with significant amount of blood on gown and blood pooled between legs and soaked into purewick. PAD removed and pressure held with gauze for 25 minutes. Bleeding stopped, new PAD put into place and inflated with 40cc of air. Dr. Wyn Quakerew notified. Once PAD inflated, minimal bleeding noted at site. Will continue to monitor closely.

## 2017-10-18 NOTE — H&P (Signed)
 [facility] History & Physical Update  The patient was interviewed and re-examined.  The patient's previous History and Physical has been reviewed and is unchanged.  There is no change in the plan of care. We plan to proceed with the scheduled procedure.  Judy BarrenJason Dew, MD  10/18/2017, 9:26 AM

## 2017-10-18 NOTE — Progress Notes (Signed)
   10/18/17 1815  Clinical Encounter Type  Visited With Patient  Visit Type Follow-up;Post-op  Referral From Nurse  Consult/Referral To Chaplain  Spiritual Encounters  Spiritual Needs Prayer;Emotional   [facility] followed up on a previous attempt to visit with Judy Harvey post surgery. Judy Harvey was alert and appeared happy to see me. She spent time talking about he family coming to visit her and her love for them. She was concerned about her vital signs and wanted me to read them off to her. Judy Harvey is legally blind (per patient statement) and needed help with the tv also. I will follow up later this evening.

## 2017-10-18 NOTE — Progress Notes (Signed)
Dr. Wyn Quakerew paged due to pt bleeding from under PAD again. Awaiting page back.

## 2017-10-18 NOTE — Op Note (Signed)
OPERATIVE NOTE DATE: 10/18/2017  PROCEDURE: 1.  Ultrasound guidance for vascular access right femoral artery 2.  Placement of a 10 mm proximal, 8 mm distal 4 cm long tapered Exact stent with the use of the NAV-6 embolic protection device in the left carotid artery  PRE-OPERATIVE DIAGNOSIS: 1. Symptomatic left carotid artery stenosis. 2. Right carotid occlusion 3. Multiple previous TIAs  POST-OPERATIVE DIAGNOSIS:  Same as above  SURGEON: Festus Barren, MD  ASSISTANT(S):  Levora Dredge, MD  ANESTHESIA: local/MCS  ESTIMATED BLOOD LOSS:  50 cc  CONTRAST: 60 cc  FLUORO TIME: 19.4 minutes  MODERATE CONSCIOUS SEDATION TIME:  Approximately 75 minutes using 4 mg of Versed and 100 mcg of Fentanyl  FINDING(S): 1.   Ulcerated 70 to 80% left carotid artery stenosis  SPECIMEN(S):   none  INDICATIONS:   Patient is a 84 y.o. female who presents with symptomatic left carotid artery stenosis.  She has had multiple TIAs with a aphasia and confusion as well as syncope on occasions.  The patient has a right carotid occlusion and multiple comorbidities and carotid artery stenting was felt to be preferred to endarterectomy for that reason.  Risks and benefits were discussed and informed consent was obtained.   DESCRIPTION: After obtaining full informed written consent, the patient was brought back to the vascular suite and placed supine upon the table.  The patient received IV antibiotics prior to induction. Moderate conscious sedation was administered during a face to face encounter with the patient throughout the procedure with my supervision of the RN administering medicines and monitoring the patients vital signs and mental status throughout from the start of the procedure until the patient was taken to the recovery room.  After obtaining adequate anesthesia, the patient was prepped and draped in the standard fashion.   The right femoral artery was visualized with ultrasound and found to be widely  patent. It was then accessed under direct ultrasound guidance without difficulty with a Seldinger needle. A permanent image was recorded. A J-wire was placed and we then placed a 6 French sheath. The patient was then heparinized and a total of 7000 units of intravenous heparin were given and an ACT was checked to confirm successful anticoagulation.  An additional 1000 units of heparin were given later in the procedure as well.  A pigtail catheter was then placed into the ascending aorta. 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 I then selectively cannulated the left common carotid artery with a Simmons 1 catheter after unsuccessfully trying to cannulate this with a Bentson Hanafee catheter but the Simmons 1 would not advance.  Images were performed and I was able to get a Glidewire up into the external carotid artery and the exchanged for a glide catheter there was advanced into external carotid artery branches.  Even with this degree of purchase, the Amplatz superstiff wire buckled the catheter and would not advance beyond the proximal carotid artery.  We then changed to a VTEK catheter and use this with a shuttle sheath in a telescoping fashion to cannulate the left common carotid artery and advanced into the mid left common carotid artery.  This was very tedious and difficult and both Dr. Gilda Crease and myself had to participate in the advancement to be able to get the sheath to advance.  Cervical and cerebral carotid angiography was then performed. There were no obvious intracranial filling defects with complete left to right cross-filling with her long-standing right  carotid occlusion. The carotid bifurcation demonstrated a 70 to 80% ulcerated irregular left bifurcation lesion with moderate calcification.I then used the NAV-6  Embolic protection device and crossed the lesion and parked this in the distal internal carotid artery at the base of the skull.  I then  selected a 10 mm proximal by 8 mm distal 4 cm long exact stent. This was deployed across the lesion encompassing it in its entirety. A 5.5 mm diameter by 2 cm length balloon was used to post dilate the stent. Only about a 20 % residual stenosis was present after angioplasty. Completion angiogram showed normal intracranial filling without new defects. At this point I elected to terminate the procedure. The sheath was removed and StarClose closure device was deployed in the right femoral artery with excellent hemostatic result. The patient was taken to the recovery room in stable condition having tolerated the procedure well.  COMPLICATIONS: none  CONDITION: stable  Festus BarrenJason Dew 10/18/2017 11:12 AM   This note was created with Dragon Medical transcription system. Any errors in dictation are purely unintentional.

## 2017-10-18 NOTE — Progress Notes (Signed)
[facility]  Daily Progress Note   Subjective  - Day of Surgery  Doing well.  Some tract ooze treated with lidocaine with epi and pressure and site looks good now.  No neuro deficits.  Hungry  Objective Vitals:   10/18/17 1500 10/18/17 1515 10/18/17 1530 10/18/17 1545  BP: (!) 118/91 97/80 (!) 112/44 91/69  Pulse: (!) 55 (!) 54 (!) 54 (!) 53  Resp: 12 (!) 21 (!) 21 (!) 21  Temp:      TempSrc:      SpO2: 95% 100% 95% 96%  Weight:      Height:       No intake or output data in the 24 hours ending 10/18/17 1624  PULM  CTAB CV  RRR VASC  Neuro exam intact, site with no current bleeding  Laboratory CBC    Component Value Date/Time   WBC 10.8 10/17/2017 1017   HGB 13.7 10/17/2017 1017   HCT 40.8 10/17/2017 1017   PLT 273 10/17/2017 1017    BMET    Component Value Date/Time   NA 142 10/17/2017 1017   K 4.3 10/17/2017 1017   CL 103 10/17/2017 1017   CO2 29 10/17/2017 1017   GLUCOSE 113 (H) 10/17/2017 1017   BUN 21 10/17/2017 1017   CREATININE 0.78 10/17/2017 1017   CALCIUM 9.1 10/17/2017 1017   GFRNONAA >60 10/17/2017 1017   GFRAA >60 10/17/2017 1017    Assessment/Planning: POD #0 s/p left carotid stent   Doing well  Site looks good after lidocaine with epi and pressure  BP on the low side but neurologically intact and stable  Watch in CCU overnight    Judy Harvey  10/18/2017, 4:24 PM

## 2017-10-18 NOTE — Progress Notes (Signed)
Per Dr. Wyn Quakerew, as long as MAP >60 and patient is neurologically intact, ok for SBP to be <100

## 2017-10-19 LAB — BASIC METABOLIC PANEL
ANION GAP: 6 (ref 5–15)
BUN: 17 mg/dL (ref 8–23)
CO2: 27 mmol/L (ref 22–32)
Calcium: 7.8 mg/dL — ABNORMAL LOW (ref 8.9–10.3)
Chloride: 108 mmol/L (ref 98–111)
Creatinine, Ser: 1 mg/dL (ref 0.44–1.00)
GFR calc non Af Amer: 49 mL/min — ABNORMAL LOW (ref >60)
GFR, EST AFRICAN AMERICAN: 57 mL/min — AB (ref >60)
Glucose, Bld: 124 mg/dL — ABNORMAL HIGH (ref 70–99)
Potassium: 4.1 mmol/L (ref 3.5–5.1)
SODIUM: 141 mmol/L (ref 135–145)

## 2017-10-19 LAB — GLUCOSE, CAPILLARY
GLUCOSE-CAPILLARY: 106 mg/dL — AB (ref 70–99)
Glucose-Capillary: 132 mg/dL — ABNORMAL HIGH (ref 70–99)

## 2017-10-19 LAB — CBC
HCT: 32 % — ABNORMAL LOW (ref 35.0–47.0)
HEMOGLOBIN: 10.7 g/dL — AB (ref 12.0–16.0)
MCH: 31.8 pg (ref 26.0–34.0)
MCHC: 33.5 g/dL (ref 32.0–36.0)
MCV: 95 fL (ref 80.0–100.0)
Platelets: 224 10*3/uL (ref 150–440)
RBC: 3.37 MIL/uL — AB (ref 3.80–5.20)
RDW: 14.3 % (ref 11.5–14.5)
WBC: 10 10*3/uL (ref 3.6–11.0)

## 2017-10-19 MED ORDER — ONDANSETRON HCL 4 MG/2ML IJ SOLN
4.0000 mg | Freq: Once | INTRAMUSCULAR | Status: AC
  Administered 2017-10-19: 4 mg via INTRAVENOUS

## 2017-10-19 MED ORDER — TRAMADOL HCL 50 MG PO TABS: 50.0000 mg | ORAL_TABLET | Freq: Four times a day (QID) | ORAL | 0 refills | Status: DC | PRN

## 2017-10-19 NOTE — Plan of Care (Signed)
Pt discharged home, IV sites DCd bleeding controlled, right femoral vascular site is clean and dry PAD off and there is no bleeding or oozing.  VSS on room air, pt eager to go home, f/u appt given to patient and DC instructions given, scrip given, understanding verbalized.  She will leave the hospital in car with her family

## 2017-10-19 NOTE — Discharge Instructions (Signed)
You may remove your dressings tomorrow and shower. Please keep your groin clean and dry.

## 2017-10-19 NOTE — Discharge Summary (Signed)
[facility]    Discharge Summary  Patient ID:  Judy MorgansMildred J Islam MRN: [id number] DOB/AGE: [birth year] 84 y.o.  Admit date: 10/18/2017 Discharge date: 10/19/2017 Date of Surgery: 10/18/2017 Surgeon: Surgeon(s): Wyn Quakerew, Marlow BaarsJason S, MD  Admission Diagnosis: Carotid stenosis, symptomatic w/o infarct, left [I65.22]  Discharge Diagnoses:  Carotid stenosis, symptomatic w/o infarct, left [I65.22]  Secondary Diagnoses: Past Medical History:  Diagnosis Date  . Depression   . Diabetes mellitus without complication (HCC)   . GERD (gastroesophageal reflux disease)   . Hyperlipidemia   . Hypertension   . Legally blind   . Macular degeneration, bilateral   . Peripheral neuropathy   . Stroke [facility](HCC)    Procedure(s): CAROTID PTA/STENT INTERVENTION  Discharged Condition: good  HPI:  The patient is a 84 year old female who presented with symptomatic left carotid artery stenosis.  She has a past medical history of multiple TIAs with aphasia and confusion as well as syncopal episodes.  The patient has a right carotid occlusion and multiple comorbidities.  On October 20, 2017, the patient underwent ultrasound guidance for vascular access right femoral artery, placement of a 10 mm proximal, 8 mm distal 4 cm long tapered Exact stent with the use of the NAV-6 embolic protection device in the left carotid artery.  She tolerated the procedure well was transferred to the ICU overnight for observation.  The patient's night of surgery was unremarkable.  Postop day 1 the patient was urinating without issue, tolerating a regular diet, her discomfort was controlled with p.o. medication and she was ambulating independently.  Hospital Course:  Judy Harvey is a 84 y.o. female is S/P Left  Procedure(s): CAROTID PTA/STENT INTERVENTION  Extubated: POD # 0  Physical exam:  A&Ox3, NAD CV: RRR Pulm: CTA bilaterally Abdomen: Soft, Non-tender, Non-distended, (+) Bowel Sounds Groin: PAD  removed. Incision clean, dry and intact. No swelling, ecchymosis or draining noted Extremities: Warm, Non-tender, Non-distended  Post-op wounds clean, dry, intact or healing well  Pt. Ambulating, voiding and taking PO diet without difficulty.  Pt pain controlled with PO pain meds.  Labs as below  Complications:none  Consults: NONE  Significant Diagnostic Studies: CBC Lab Results  Component Value Date   WBC 10.0 10/19/2017   HGB 10.7 (L) 10/19/2017   HCT 32.0 (L) 10/19/2017   MCV 95.0 10/19/2017   PLT 224 10/19/2017   BMET    Component Value Date/Time   NA 141 10/19/2017 0305   K 4.1 10/19/2017 0305   CL 108 10/19/2017 0305   CO2 27 10/19/2017 0305   GLUCOSE 124 (H) 10/19/2017 0305   BUN 17 10/19/2017 0305   CREATININE 1.00 10/19/2017 0305   CALCIUM 7.8 (L) 10/19/2017 0305   GFRNONAA 49 (L) 10/19/2017 0305   GFRAA 57 (L) 10/19/2017 0305   COAG Lab Results  Component Value Date   INR 1.00 10/17/2017   Disposition:  Discharge to :Home  Allergies as of 10/19/2017      Reactions   Adhesive [tape] Other (See Comments)   "pulls my skin off" paper tape ok   Lyrica [pregabalin]    Body spasms      Medication List    TAKE these medications   aspirin EC 81 MG tablet Take 81 mg by mouth daily.   atorvastatin 80 MG tablet Commonly known as:  LIPITOR Take 1 tablet (80 mg total) by mouth at bedtime.   clopidogrel 75 MG tablet Commonly known as:  PLAVIX TAKE 1 TABLET BY MOUTH DAILY  DULoxetine 30 MG capsule Commonly known as:  CYMBALTA Take 1 capsule (30 mg total) by mouth daily.   gabapentin 300 MG capsule Commonly known as:  NEURONTIN TAKE 1 CAPSULE BY MOUTH THREE TIMES A DAY   Insulin Glargine 100 UNIT/ML Solostar Pen Commonly known as:  LANTUS Inject 50 Units into the skin daily at 10 pm.   Insulin Pen Needle 31G X 5 MM Misc Use with insulin pen to inject once daily.   Insulin Syringe-Needle U-100 31G X 5/16" 0.3 ML Misc Use to administer  insulin daily to control diabetes. 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   lisinopril 10 MG tablet Commonly known as:  PRINIVIL,ZESTRIL Take 1 tablet (10 mg total) by mouth daily.   metFORMIN 500 MG 24 hr tablet Commonly known as:  GLUCOPHAGE-XR Take 2 tablets (1,000 mg total) by mouth daily with breakfast.   ondansetron 8 MG tablet Commonly known as:  ZOFRAN Take 0.5 tablets (4 mg total) by mouth every 8 (eight) hours as needed for nausea. What changed:    how much to take  reasons to take this   senna-docusate 8.6-50 MG tablet Commonly known as:  Senokot-S Take 1 tablet by mouth daily as needed for mild constipation. What changed:  when to take this   traMADol 50 MG tablet Commonly known as:  ULTRAM Take 1 tablet (50 mg total) by mouth every 6 (six) hours as needed.   TYLENOL 325 MG Caps Generic drug:  Acetaminophen Take 2 tablets by mouth every 6 (six) hours as needed.   Vitamin D3 5000 units Caps Take 1 capsule (5,000 Units total) by mouth daily.      Verbal and written Discharge instructions given to the patient. Wound care per Discharge AVS Follow-up Information    Dew, Marlow Baars, MD Follow up in 1 month(s).   Specialties:  Vascular Surgery, Radiology, Interventional Cardiology Why:  First post-op visit. Will need carotid ultrasound.  Contact information: [address] [place] [address] [phone]          Signed: Tonette Lederer, PA-C  10/19/2017, 1:10 PM

## 2017-11-01 ENCOUNTER — Other Ambulatory Visit (INDEPENDENT_AMBULATORY_CARE_PROVIDER_SITE_OTHER): Payer: Self-pay | Admitting: Vascular Surgery

## 2017-11-01 ENCOUNTER — Ambulatory Visit (INDEPENDENT_AMBULATORY_CARE_PROVIDER_SITE_OTHER): Payer: Medicare Other

## 2017-11-01 ENCOUNTER — Encounter (INDEPENDENT_AMBULATORY_CARE_PROVIDER_SITE_OTHER): Payer: Self-pay | Admitting: Nurse Practitioner

## 2017-11-01 ENCOUNTER — Ambulatory Visit (INDEPENDENT_AMBULATORY_CARE_PROVIDER_SITE_OTHER): Payer: Medicare Other | Admitting: Nurse Practitioner

## 2017-11-01 VITALS — BP 143/74 | HR 60 | Resp 16 | Ht 62.0 in | Wt 168.8 lb

## 2017-11-01 DIAGNOSIS — I10 Essential (primary) hypertension: Secondary | ICD-10-CM

## 2017-11-01 DIAGNOSIS — I6521 Occlusion and stenosis of right carotid artery: Secondary | ICD-10-CM

## 2017-11-01 DIAGNOSIS — E785 Hyperlipidemia, unspecified: Secondary | ICD-10-CM

## 2017-11-01 DIAGNOSIS — I6522 Occlusion and stenosis of left carotid artery: Secondary | ICD-10-CM

## 2017-11-01 DIAGNOSIS — Z959 Presence of cardiac and vascular implant and graft, unspecified: Secondary | ICD-10-CM

## 2017-11-01 DIAGNOSIS — I6523 Occlusion and stenosis of bilateral carotid arteries: Secondary | ICD-10-CM

## 2017-11-01 NOTE — Progress Notes (Signed)
Subjective:    Patient ID: Judy Harvey, female    DOB: 1927/03/17, 84 y.o.   MRN: [id number] Chief Complaint  Patient presents with  . Follow-up    [facility] 74month carotid    HPI  The patient is seen for follow up evaluation of carotid stenosis status post carotid stent on 10/18/2017.  There were no post operative problems or complications related to the surgery.  The patient denies neck or incisional pain.  The patient denies any groin pain or issues with bleeding or bruising following the procedure  The patient denies interval amaurosis fugax. There is no recent history of TIA symptoms or focal motor deficits. There is no prior documented CVA.  The patient denies headache.  The patient is taking enteric-coated aspirin 81 mg daily.  The patient has a history of coronary artery disease, no recent episodes of angina or shortness of breath. The patient denies PAD or claudication symptoms. There is a history of hyperlipidemia which is being treated with a statin.   Patient underwent a carotid artery duplex today which reveal that velocities in the right internal carotid artery are consistent with a total occlusion, where as the left carotid artery shows a patent carotid stent with no hemodynamically significant carotid stenosis.  There is also antegrade flow in the vertebral arteries.  Previous studies on 06/11/2017 show a chronic right ICA occlusion and an 80% stenosis of the left carotid.  Constitutional: [] Weight loss  [] Fever  [] Chills Cardiac: [] Chest pain   [] Chest pressure   [] Palpitations   [] Shortness of breath when laying flat   [] Shortness of breath with exertion. Vascular:  [] Pain in legs with walking   [] Pain in legs with standing  [] History of DVT   [] Phlebitis   [] Swelling in legs   [x] Varicose veins   [] Non-healing ulcers Pulmonary:   [] Uses home oxygen   [] Productive cough   [] Hemoptysis   [] Wheeze  [] COPD   [] Asthma Neurologic:  [] Dizziness   [] Seizures   [x] History of  stroke   [] History of TIA  [] Aphasia   [x] Vissual changes   [] Weakness or numbness in arm   [] Weakness or numbness in leg Musculoskeletal:   [] Joint swelling   [] Joint pain   [] Low back pain Hematologic:  [] Easy bruising  [] Easy bleeding   [] Hypercoagulable state   [] Anemic Gastrointestinal:  [] Diarrhea   [] Vomiting  [] Gastroesophageal reflux/heartburn   [] Difficulty swallowing. Genitourinary:  [] Chronic kidney disease   [] Difficult urination  [] Frequent urination   [] Blood in urine Skin:  [x] Rashes   [] Ulcers  Psychological:  [] History of anxiety   []  History of major depression.     Objective:   Physical Exam  BP (!) 143/74 (BP Location: Left Arm)   Pulse 60   Resp 16   Ht 5\' 2"  (1.575 m)   Wt 168 lb 12.8 oz (76.6 kg)   BMI 30.87 kg/m   Past Medical History:  Diagnosis Date  . Depression   . Diabetes mellitus without complication (HCC)   . GERD (gastroesophageal reflux disease)   . Hyperlipidemia   . Hypertension   . Legally blind   . Macular degeneration, bilateral   . Peripheral neuropathy   . Stroke [facility])      Gen: WD/WN, NAD Head: [place]/AT, No temporalis wasting.  Ear/Nose/Throat: Hearing grossly intact, nares w/o erythema or drainage vision greatly impaired Eyes: PER, EOMI, sclera nonicteric.  Neck: Supple, no masses.  No bruit or JVD.  Pulmonary:  Good air movement, clear to auscultation bilaterally,  no use of accessory muscles.  Cardiac: RRR Vascular:  Vessel Right Left  Carotid  no bruit  no bruit  Gastrointestinal: soft, non-distended. No guarding/no peritoneal signs.  Musculoskeletal: M/S 5/5 throughout.  No deformity or atrophy.  Neurologic: Pain and light touch intact in extremities.  Symmetrical.  Speech is fluent. Motor exam as listed above. Psychiatric: Judgment intact, Mood & affect appropriate for pt's clinical situation. Dermatologic: No Venous rashes no ulcers noted.  No changes consistent with cellulitis. Lymph : No Cervical lymphadenopathy, no  lichenification or skin changes of chronic lymphedema.   Social History   Socioeconomic History  . Marital status: Widowed    Spouse name: Not on file  . Number of children: 3  . Years of education: some college  . Highest education level: Not on file  Occupational History  . Occupation: Disabled  Social Needs  . Financial resource strain: Not hard at all  . Food insecurity:    Worry: Never true    Inability: Never true  . Transportation needs:    Medical: Yes    Non-medical: Yes  Tobacco Use  . Smoking status: Former Smoker    Packs/day: 3.00    Years: 50.00    Pack years: 150.00    Types: Cigarettes    Last attempt to quit: 2008    Years since quitting: 11.6  . Smokeless tobacco: Never Used  . Tobacco comment: smoking cessation materials not required  Substance and Sexual Activity  . Alcohol use: No  . Drug use: No  . Sexual activity: Never  Lifestyle  . Physical activity:    Days per week: 0 days    Minutes per session: 0 min  . Stress: Not at all  Relationships  . Social connections:    Talks on phone: Patient refused    Gets together: Patient refused    Attends religious service: Patient refused    Active member of club or organization: Patient refused    Attends meetings of clubs or organizations: Patient refused    Relationship status: Widowed  . Intimate partner violence:    Fear of current or ex partner: Patient refused    Emotionally abused: Patient refused    Physically abused: Patient refused    Forced sexual activity: Patient refused  Other Topics Concern  . Not on file  Social History Narrative  . Not on file    Past Surgical History:  Procedure Laterality Date  . ABDOMINAL HYSTERECTOMY     partial  . APPENDECTOMY    . BREAST SURGERY     reduction  . CAROTID PTA/STENT INTERVENTION Left 10/18/2017   Procedure: CAROTID PTA/STENT INTERVENTION;  Surgeon: Annice Needyew, Jason S, MD;  Location: [facility] INVASIVE CV LAB;  Service: Cardiovascular;  Laterality:  Left;  . CATARACT EXTRACTION, BILATERAL    . CESAREAN SECTION     x3  . COLONOSCOPY    . ESOPHAGEAL DILATION      Family History  Problem Relation Age of Onset  . Healthy Mother   . Diabetes Father   . Alzheimer's disease Father   . Multiple sclerosis Sister   . Multiple sclerosis Brother     Allergies  Allergen Reactions  . Adhesive [Tape] Other (See Comments)    "pulls my skin off" paper tape ok  . Lyrica [Pregabalin]     Body spasms       Assessment & Plan:   1. Bilateral carotid artery stenosis Recommend:  The patient is s/p successful left carotid artery  stent on 10/18/2017  Duplex ultrasound preoperatively shows 80% contralateral stenosis.  Continue antiplatelet therapy as prescribed Continue management of CAD, HTN and Hyperlipidemia Healthy heart diet,  encouraged exercise at least 4 times per week  Follow up in 3 months with duplex ultrasound and physical exam based on the patient's carotid surgery and total occlusion of the right carotid artery.   - VAS US CAROTID; Future  2. Hyperlipidemia, unspecified hyperlipidemia type Continue statin as ordered and reviewed, no changes at this time   3. Essential hypertension Continue antihypertensive medications as already ordered, these medications have been reviewed and there are no changes at this time.    Current Outpatient Medications on File Prior to Visit  Medication Sig Dispense Refill  . Acetaminophen (TYLENOL) 325 MG CAPS Take 2 tablets by mouth every 6 (six) hours as needed.     Marland Kitchen. aspirin EC 81 MG tablet Take 81 mg by mouth daily.    Marland Kitchen. atorvastatin (LIPITOR) 80 MG tablet Take 1 tablet (80 mg total) by mouth at bedtime. 90 tablet 1  . Cholecalciferol (VITAMIN D3) 5000 units CAPS Take 1 capsule (5,000 Units total) by mouth daily. 30 capsule   . clopidogrel (PLAVIX) 75 MG tablet TAKE 1 TABLET BY MOUTH DAILY 30 tablet 0  . DULoxetine (CYMBALTA) 30 MG capsule Take 1 capsule (30 mg total) by mouth daily.  90 capsule 3  . gabapentin (NEURONTIN) 300 MG capsule TAKE 1 CAPSULE BY MOUTH THREE TIMES A DAY 270 capsule 0  . Insulin Glargine (LANTUS SOLOSTAR) 100 UNIT/ML Solostar Pen Inject 50 Units into the skin daily at 10 pm. 5 pen 3  . Insulin Pen Needle 31G X 5 MM MISC Use with insulin pen to inject once daily. 30 each 3  . Insulin Syringe-Needle U-100 (B-D INS SYR ULTRAFINE .3CC/31G) 31G X 5/16" 0.3 ML MISC Use to administer insulin daily to control diabetes. 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 100 each 3  . lisinopril (PRINIVIL,ZESTRIL) 10 MG tablet Take 1 tablet (10 mg total) by mouth daily. 90 tablet 1  . metFORMIN (GLUCOPHAGE-XR) 500 MG 24 hr tablet Take 2 tablets (1,000 mg total) by mouth daily with breakfast. 180 tablet 1  . ondansetron (ZOFRAN) 8 MG tablet Take 0.5 tablets (4 mg total) by mouth every 8 (eight) hours as needed for nausea. (Patient taking differently: Take 8 mg by mouth every 8 (eight) hours as needed for nausea (patient cannot see to cut in half). ) 90 tablet 0  . senna-docusate (SENOKOT S) 8.6-50 MG tablet Take 1 tablet by mouth daily as needed for mild constipation. (Patient taking differently: Take 1 tablet by mouth at bedtime. )    . traMADol (ULTRAM) 50 MG tablet Take 1 tablet (50 mg total) by mouth every 6 (six) hours as needed. 10 tablet 0   No current facility-administered medications on file prior to visit.     There are no Patient Instructions on file for this visit. No follow-ups on file.   Georgiana SpinnerFallon E Teng Decou, NP

## 2017-11-03 ENCOUNTER — Other Ambulatory Visit: Payer: Self-pay | Admitting: Family Medicine

## 2017-11-06 ENCOUNTER — Encounter: Payer: Self-pay | Admitting: Family Medicine

## 2017-11-06 ENCOUNTER — Ambulatory Visit: Payer: Medicare Other | Admitting: Family Medicine

## 2017-11-06 VITALS — BP 100/64 | HR 68 | Ht 62.0 in | Wt 169.0 lb

## 2017-11-06 DIAGNOSIS — N843 Polyp of vulva: Secondary | ICD-10-CM

## 2017-11-06 DIAGNOSIS — L600 Ingrowing nail: Secondary | ICD-10-CM

## 2017-11-06 DIAGNOSIS — L408 Other psoriasis: Secondary | ICD-10-CM | POA: Diagnosis not present

## 2017-11-06 DIAGNOSIS — E1140 Type 2 diabetes mellitus with diabetic neuropathy, unspecified: Secondary | ICD-10-CM

## 2017-11-06 DIAGNOSIS — Z794 Long term (current) use of insulin: Secondary | ICD-10-CM

## 2017-11-06 MED ORDER — NYSTATIN 100000 UNIT/GM EX CREA: 1.0000 "application " | TOPICAL_CREAM | Freq: Two times a day (BID) | CUTANEOUS | 0 refills | Status: DC

## 2017-11-06 MED ORDER — MUPIROCIN 2 % EX OINT: 1.0000 "application " | TOPICAL_OINTMENT | Freq: Two times a day (BID) | CUTANEOUS | 0 refills | Status: DC

## 2017-11-06 NOTE — Progress Notes (Signed)
Name: Judy Harvey   MRN: [id number]    DOB: September 13, 1927   Date:11/06/2017       Progress Note  Subjective  Chief Complaint  Chief Complaint  Patient presents with  . fingernail pain    L) middle finger pain  . rectum itching    there is a place that itches on her rectum. "not a hemorrhoid"    Patient has 3 primary complaints 1) ingrown fingernail. 2) plaque psoriasis 3) labial polyp 4) AODM needs follow up  Diabetes  She presents for her follow-up diabetic visit. She has type 2 diabetes mellitus. Her disease course has been stable. Pertinent negatives for hypoglycemia include no dizziness, headaches or nervousness/anxiousness. Pertinent negatives for diabetes include no blurred vision, no chest pain, no polydipsia and no weight loss. There are no diabetic complications.    No problem-specific Assessment & Plan notes found for this encounter.   Past Medical History:  Diagnosis Date  . Depression   . Diabetes mellitus without complication (HCC)   . GERD (gastroesophageal reflux disease)   . Hyperlipidemia   . Hypertension   . Legally blind   . Macular degeneration, bilateral   . Peripheral neuropathy   . Stroke [facility])     Past Surgical History:  Procedure Laterality Date  . ABDOMINAL HYSTERECTOMY     partial  . APPENDECTOMY    . BREAST SURGERY     reduction  . CAROTID PTA/STENT INTERVENTION Left 10/18/2017   Procedure: CAROTID PTA/STENT INTERVENTION;  Surgeon: Annice Needy, MD;  Location: [facility] INVASIVE CV LAB;  Service: Cardiovascular;  Laterality: Left;  . CATARACT EXTRACTION, BILATERAL    . CESAREAN SECTION     x3  . COLONOSCOPY    . ESOPHAGEAL DILATION      Family History  Problem Relation Age of Onset  . Healthy Mother   . Diabetes Father   . Alzheimer's disease Father   . Multiple sclerosis Sister   . Multiple sclerosis Brother     Social History   Socioeconomic History  . Marital status: Widowed    Spouse name: Not on file  . Number of children:  3  . Years of education: some college  . Highest education level: Not on file  Occupational History  . Occupation: Disabled  Social Needs  . Financial resource strain: Not hard at all  . Food insecurity:    Worry: Never true    Inability: Never true  . Transportation needs:    Medical: Yes    Non-medical: Yes  Tobacco Use  . Smoking status: Former Smoker    Packs/day: 3.00    Years: 50.00    Pack years: 150.00    Types: Cigarettes    Last attempt to quit: 2008    Years since quitting: 11.6  . Smokeless tobacco: Never Used  . Tobacco comment: smoking cessation materials not required  Substance and Sexual Activity  . Alcohol use: No  . Drug use: No  . Sexual activity: Never  Lifestyle  . Physical activity:    Days per week: 0 days    Minutes per session: 0 min  . Stress: Not at all  Relationships  . Social connections:    Talks on phone: Patient refused    Gets together: Patient refused    Attends religious service: Patient refused    Active member of club or organization: Patient refused    Attends meetings of clubs or organizations: Patient refused    Relationship status: Widowed  .  Intimate partner violence:    Fear of current or ex partner: Patient refused    Emotionally abused: Patient refused    Physically abused: Patient refused    Forced sexual activity: Patient refused  Other Topics Concern  . Not on file  Social History Narrative  . Not on file    Allergies  Allergen Reactions  . Adhesive [Tape] Other (See Comments)    "pulls my skin off" paper tape ok  . Lyrica [Pregabalin]     Body spasms    Outpatient Medications Prior to Visit  Medication Sig Dispense Refill  . Acetaminophen (TYLENOL) 325 MG CAPS Take 2 tablets by mouth every 6 (six) hours as needed.     Marland Kitchen. aspirin EC 81 MG tablet Take 81 mg by mouth daily.    Marland Kitchen. atorvastatin (LIPITOR) 80 MG tablet Take 1 tablet (80 mg total) by mouth at bedtime. 90 tablet 1  . Cholecalciferol (VITAMIN D3)  5000 units CAPS Take 1 capsule (5,000 Units total) by mouth daily. 30 capsule   . clopidogrel (PLAVIX) 75 MG tablet TAKE 1 TABLET BY MOUTH DAILY 30 tablet 0  . DULoxetine (CYMBALTA) 30 MG capsule Take 1 capsule (30 mg total) by mouth daily. 90 capsule 3  . gabapentin (NEURONTIN) 300 MG capsule TAKE 1 CAPSULE BY MOUTH THREE TIMES A DAY 270 capsule 0  . Insulin Syringe-Needle U-100 (B-D INS SYR ULTRAFINE .3CC/31G) 31G X 5/16" 0.3 ML MISC Use to administer insulin daily to control diabetes. 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 100 each 3  . lisinopril (PRINIVIL,ZESTRIL) 10 MG tablet Take 1 tablet (10 mg total) by mouth daily. 90 tablet 1  . metFORMIN (GLUCOPHAGE-XR) 500 MG 24 hr tablet Take 2 tablets (1,000 mg total) by mouth daily with breakfast. 180 tablet 1  . senna-docusate (SENOKOT S) 8.6-50 MG tablet Take 1 tablet by mouth daily as needed for mild constipation. (Patient taking differently: Take 1 tablet by mouth at bedtime. )    . Insulin Glargine (LANTUS SOLOSTAR) 100 UNIT/ML Solostar Pen Inject 50 Units into the skin daily at 10 pm. 5 pen 3  . Insulin Pen Needle 31G X 5 MM MISC Use with insulin pen to inject once daily. 30 each 3  . LANTUS 100 UNIT/ML injection INJECT 50 UNITS INTO THE SKIN DAILY AT 10 PM. 20 mL 0  . ondansetron (ZOFRAN) 8 MG tablet Take 0.5 tablets (4 mg total) by mouth every 8 (eight) hours as needed for nausea. (Patient not taking: Reported on 11/06/2017) 90 tablet 0  . traMADol (ULTRAM) 50 MG tablet Take 1 tablet (50 mg total) by mouth every 6 (six) hours as needed. 10 tablet 0   No facility-administered medications prior to visit.     Review of Systems  Constitutional: Negative for chills, fever, malaise/fatigue and weight loss.  HENT: Negative for ear discharge, ear pain and sore throat.   Eyes: Negative for blurred vision.  Respiratory: Negative for cough, sputum production, shortness of breath and wheezing.   Cardiovascular:  Negative for chest pain, palpitations and leg swelling.  Gastrointestinal: Negative for abdominal pain, blood in stool, constipation, diarrhea, heartburn, melena and nausea.  Genitourinary: Negative for dysuria, frequency, hematuria and urgency.  Musculoskeletal: Negative for back pain, joint pain, myalgias and neck pain.  Skin: Negative for rash.  Neurological: Negative for dizziness, tingling, sensory change, focal weakness and headaches.  Endo/Heme/Allergies: Negative for environmental allergies and polydipsia. Does not bruise/bleed easily.  Psychiatric/Behavioral: Negative for depression and suicidal  ideas. The patient is not nervous/anxious and does not have insomnia.      Objective  Vitals:   11/06/17 1419  BP: 100/64  Pulse: 68  Weight: 169 lb (76.7 kg)  Height: 5\' 2"  (1.575 m)    Physical Exam  Constitutional: No distress.  HENT:  Head: Normocephalic and atraumatic.  Right Ear: External ear normal.  Left Ear: External ear normal.  Nose: Nose normal.  Mouth/Throat: Oropharynx is clear and moist.  Eyes: Pupils are equal, round, and reactive to light. Conjunctivae and EOM are normal. Right eye exhibits no discharge. Left eye exhibits no discharge.  Neck: Normal range of motion. Neck supple. No JVD present. No thyromegaly present.  Cardiovascular: Normal rate, regular rhythm, normal heart sounds and intact distal pulses. Exam reveals no gallop and no friction rub.  No murmur heard. Pulmonary/Chest: Effort normal and breath sounds normal.  Abdominal: Soft. Bowel sounds are normal. She exhibits no mass. There is no tenderness. There is no guarding.  Musculoskeletal: Normal range of motion. She exhibits no edema.  Ingrown nail middle finger  Lymphadenopathy:    She has no cervical adenopathy.  Neurological: She is alert. She has normal reflexes.  Skin: Skin is warm and dry. She is not diaphoretic.  Multiple plaques/ with furuncle of sacral area  Nursing note and vitals  reviewed.     Assessment & Plan  Problem List Items Addressed This Visit      Endocrine   Controlled type 2 diabetes mellitus with diabetic neuropathy (HCC)    Other Visit Diagnoses    Ingrown nail of left middle finger    -  Primary   Secondary to clubbing has caused ingrown effect. Will refer to dermatology tomorrow for possible excision.    Relevant Orders   Ambulatory referral to Dermatology   Psoriasis with pustules       Sacral area with secondary infected plaque will have dermatology evaluate. Apply bacroban to area.   Relevant Orders   Ambulatory referral to Dermatology   Vulvar/labial polyp       secondary infection of anterior aspect /apply bactroban and nystatin bid/Referral to gyn for eval and possible removal.   Relevant Orders   Ambulatory referral to Obstetrics / Gynecology      Meds ordered this encounter  Medications  . nystatin cream (MYCOSTATIN)    Sig: Apply 1 application topically 2 (two) times daily.    Dispense:  30 g    Refill:  0  . mupirocin ointment (BACTROBAN) 2 %    Sig: Apply 1 application topically 2 (two) times daily.    Dispense:  22 g    Refill:  0      Dr. Hayden [facility]  11/06/17

## 2017-11-10 ENCOUNTER — Other Ambulatory Visit: Payer: Self-pay | Admitting: Family Medicine

## 2017-12-06 ENCOUNTER — Other Ambulatory Visit: Payer: Self-pay | Admitting: Family Medicine

## 2017-12-09 ENCOUNTER — Other Ambulatory Visit: Payer: Self-pay | Admitting: Family Medicine

## 2017-12-12 ENCOUNTER — Telehealth: Payer: Self-pay

## 2017-12-12 NOTE — Telephone Encounter (Signed)
Called with sched appt to check BS readings on Lantus to determine if she needs to stay on med

## 2017-12-18 ENCOUNTER — Ambulatory Visit: Payer: Medicare Other | Admitting: Family Medicine

## 2017-12-18 ENCOUNTER — Encounter: Payer: Self-pay | Admitting: Family Medicine

## 2017-12-18 VITALS — BP 128/80 | HR 64 | Ht 62.0 in | Wt 166.0 lb

## 2017-12-18 DIAGNOSIS — E119 Type 2 diabetes mellitus without complications: Secondary | ICD-10-CM

## 2017-12-18 DIAGNOSIS — Z794 Long term (current) use of insulin: Secondary | ICD-10-CM

## 2017-12-18 DIAGNOSIS — L600 Ingrowing nail: Secondary | ICD-10-CM

## 2017-12-18 DIAGNOSIS — K5900 Constipation, unspecified: Secondary | ICD-10-CM

## 2017-12-18 DIAGNOSIS — E1142 Type 2 diabetes mellitus with diabetic polyneuropathy: Secondary | ICD-10-CM

## 2017-12-18 DIAGNOSIS — Z23 Encounter for immunization: Secondary | ICD-10-CM

## 2017-12-18 DIAGNOSIS — E785 Hyperlipidemia, unspecified: Secondary | ICD-10-CM | POA: Diagnosis not present

## 2017-12-18 DIAGNOSIS — F324 Major depressive disorder, single episode, in partial remission: Secondary | ICD-10-CM | POA: Diagnosis not present

## 2017-12-18 DIAGNOSIS — B379 Candidiasis, unspecified: Secondary | ICD-10-CM

## 2017-12-18 DIAGNOSIS — I10 Essential (primary) hypertension: Secondary | ICD-10-CM

## 2017-12-18 MED ORDER — ATORVASTATIN CALCIUM 80 MG PO TABS: 80.0000 mg | ORAL_TABLET | Freq: Every day | ORAL | 1 refills | Status: DC

## 2017-12-18 MED ORDER — "INSULIN SYRINGE-NEEDLE U-100 31G X 5/16"" 0.3 ML MISC": 3 refills | Status: DC

## 2017-12-18 MED ORDER — LISINOPRIL 10 MG PO TABS: 10.0000 mg | ORAL_TABLET | Freq: Every day | ORAL | 1 refills | Status: DC

## 2017-12-18 MED ORDER — DULOXETINE HCL 30 MG PO CPEP: 30.0000 mg | ORAL_CAPSULE | Freq: Every day | ORAL | 1 refills | Status: DC

## 2017-12-18 MED ORDER — GABAPENTIN 300 MG PO CAPS: ORAL_CAPSULE | ORAL | 1 refills | Status: DC

## 2017-12-18 MED ORDER — INSULIN GLARGINE 100 UNIT/ML ~~LOC~~ SOLN: SUBCUTANEOUS | 5 refills | Status: DC

## 2017-12-18 MED ORDER — SENNOSIDES-DOCUSATE SODIUM 8.6-50 MG PO TABS
1.0000 | ORAL_TABLET | Freq: Every day | ORAL | Status: DC | PRN
Start: 2017-12-18 — End: 2018-04-16

## 2017-12-18 MED ORDER — NYSTATIN 100000 UNIT/GM EX CREA: 1.0000 "application " | TOPICAL_CREAM | Freq: Two times a day (BID) | CUTANEOUS | 0 refills | Status: DC

## 2017-12-18 MED ORDER — CEPHALEXIN 500 MG PO CAPS
500.0000 mg | ORAL_CAPSULE | Freq: Four times a day (QID) | ORAL | 0 refills | Status: DC
Start: 2017-12-18 — End: 2018-02-19

## 2017-12-18 MED ORDER — METFORMIN HCL ER 500 MG PO TB24: 1000.0000 mg | ORAL_TABLET | Freq: Every day | ORAL | 5 refills | Status: DC

## 2017-12-18 MED ORDER — CLOPIDOGREL BISULFATE 75 MG PO TABS: 75.0000 mg | ORAL_TABLET | Freq: Every day | ORAL | 1 refills | Status: DC

## 2017-12-18 NOTE — Progress Notes (Signed)
Date:  12/18/2017   Name:  Judy Harvey   DOB:  12/14/1927   MRN:  [id number]   Chief Complaint: Diabetes; Hyperlipidemia; Hypertension; Peripheral Neuropathy; and Flu Vaccine Diabetes  She presents for her follow-up diabetic visit. She has type 2 diabetes mellitus. Her disease course has been stable. There are no hypoglycemic associated symptoms. Pertinent negatives for hypoglycemia include no dizziness, headaches, nervousness/anxiousness or sweats. Pertinent negatives for diabetes include no blurred vision, no chest pain, no fatigue, no foot paresthesias, no foot ulcerations, no polydipsia, no polyphagia, no polyuria, no visual change, no weakness and no weight loss. There are no hypoglycemic complications. Symptoms are stable. There are no diabetic complications. Pertinent negatives for diabetic complications include no CVA, PVD or retinopathy. Risk factors for coronary artery disease include dyslipidemia and diabetes mellitus. Current diabetic treatment includes insulin injections and oral agent (monotherapy). She is compliant with treatment all of the time. She is following a generally unhealthy diet. She rarely participates in exercise. An ACE inhibitor/angiotensin II receptor blocker is being taken. She does not see a podiatrist.Eye exam is current.  Hyperlipidemia  This is a chronic problem. The current episode started more than 1 year ago. The problem is controlled. Recent lipid tests were reviewed and are normal. She has no history of chronic renal disease or diabetes. Pertinent negatives include no chest pain, focal sensory loss, focal weakness, leg pain, myalgias or shortness of breath. Current antihyperlipidemic treatment includes statins. The current treatment provides moderate improvement of lipids. There are no compliance problems.  Risk factors for coronary artery disease include diabetes mellitus and dyslipidemia.  Hypertension  The current episode started more than 1 year ago.  The problem is unchanged. The problem is controlled. Pertinent negatives include no anxiety, blurred vision, chest pain, headaches, malaise/fatigue, neck pain, orthopnea, palpitations, peripheral edema, PND, shortness of breath or sweats. There are no associated agents to hypertension. There are no known risk factors for coronary artery disease. Past treatments include ACE inhibitors. The current treatment provides moderate improvement. There are no compliance problems.  There is no history of angina, kidney disease, CAD/MI, CVA, heart failure, left ventricular hypertrophy, PVD or retinopathy. There is no history of chronic renal disease, a hypertension causing med or renovascular disease.     Review of Systems  Constitutional: Negative.  Negative for chills, fatigue, fever, malaise/fatigue, unexpected weight change and weight loss.  HENT: Negative for congestion, ear discharge, ear pain, rhinorrhea, sinus pressure, sneezing and sore throat.   Eyes: Negative for blurred vision, photophobia, pain, discharge, redness and itching.  Respiratory: Negative for cough, shortness of breath, wheezing and stridor.   Cardiovascular: Negative for chest pain, palpitations, orthopnea and PND.  Gastrointestinal: Negative for abdominal pain, blood in stool, constipation, diarrhea, nausea and vomiting.  Endocrine: Negative for cold intolerance, heat intolerance, polydipsia, polyphagia and polyuria.  Genitourinary: Negative for dysuria, flank pain, frequency, hematuria, menstrual problem, pelvic pain, urgency, vaginal bleeding and vaginal discharge.  Musculoskeletal: Negative for arthralgias, back pain, myalgias and neck pain.  Skin: Negative for rash.  Allergic/Immunologic: Negative for environmental allergies and food allergies.  Neurological: Negative for dizziness, focal weakness, weakness, light-headedness, numbness and headaches.  Hematological: Negative for adenopathy. Does not bruise/bleed easily.    Psychiatric/Behavioral: Negative for dysphoric mood. The patient is not nervous/anxious.     Patient Active Problem List   Diagnosis Date Noted  . Carotid stenosis, symptomatic w/o infarct, left 10/18/2017  . TIA (transient ischemic attack) 06/16/2017  . Carotid stenosis 06/16/2017  .  Hyperlipidemia 06/16/2017  . Obesity (BMI 30.0-34.9) 05/02/2016  . Vitamin D deficiency 03/25/2016  . Controlled type 2 diabetes mellitus with diabetic neuropathy (HCC) 03/24/2016  . Old cerebrovascular accident (CVA) without late effect 03/24/2016  . Hypertension 03/24/2016  . Legally blind 03/24/2016  . Macular degeneration 03/24/2016  . Constipation 03/24/2016  . History of esophageal stricture 03/24/2016  . Depression with anxiety 03/24/2016  . Restless leg syndrome 03/24/2016  . Postherpetic neuralgia 03/24/2016  . Gait abnormality 03/24/2016    Allergies  Allergen Reactions  . Adhesive [Tape] Other (See Comments)    "pulls my skin off" paper tape ok  . Lyrica [Pregabalin]     Body spasms    Past Surgical History:  Procedure Laterality Date  . ABDOMINAL HYSTERECTOMY     partial  . APPENDECTOMY    . BREAST SURGERY     reduction  . CAROTID PTA/STENT INTERVENTION Left 10/18/2017   Procedure: CAROTID PTA/STENT INTERVENTION;  Surgeon: Annice Needy, MD;  Location: [facility] INVASIVE CV LAB;  Service: Cardiovascular;  Laterality: Left;  . CATARACT EXTRACTION, BILATERAL    . CESAREAN SECTION     x3  . COLONOSCOPY    . ESOPHAGEAL DILATION      Social History   Tobacco Use  . Smoking status: Former Smoker    Packs/day: 3.00    Years: 50.00    Pack years: 150.00    Types: Cigarettes    Last attempt to quit: 2008    Years since quitting: 11.7  . Smokeless tobacco: Never Used  . Tobacco comment: smoking cessation materials not required  Substance Use Topics  . Alcohol use: No  . Drug use: No     Medication list has been reviewed and updated.  Current Meds  Medication Sig  .  Acetaminophen (TYLENOL) 325 MG CAPS Take 2 tablets by mouth every 6 (six) hours as needed.   Marland Kitchen aspirin EC 81 MG tablet Take 81 mg by mouth daily.  Marland Kitchen atorvastatin (LIPITOR) 80 MG tablet Take 1 tablet (80 mg total) by mouth at bedtime.  . Black Cohosh 540 MG CAPS Take 1 capsule by mouth daily.  . Cholecalciferol (VITAMIN D3) 5000 units CAPS Take 1 capsule (5,000 Units total) by mouth daily.  . clopidogrel (PLAVIX) 75 MG tablet Take 1 tablet (75 mg total) by mouth daily.  . DULoxetine (CYMBALTA) 30 MG capsule Take 1 capsule (30 mg total) by mouth daily.  Marland Kitchen gabapentin (NEURONTIN) 300 MG capsule TAKE 1 CAPSULE BY MOUTH THREE TIMES A DAY  . insulin glargine (LANTUS) 100 UNIT/ML injection INJECT 50 UNITS INTO THE SKIN DAILY AT 10 PM.  . Insulin Syringe-Needle U-100 (B-D INS SYR ULTRAFINE .3CC/31G) 31G X 5/16" 0.3 ML MISC Use to administer insulin daily to control diabetes. 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  Marland Kitchen lisinopril (PRINIVIL,ZESTRIL) 10 MG tablet Take 1 tablet (10 mg total) by mouth daily.  . metFORMIN (GLUCOPHAGE-XR) 500 MG 24 hr tablet Take 2 tablets (1,000 mg total) by mouth daily with breakfast.  . mupirocin ointment (BACTROBAN) 2 % Apply 1 application topically 2 (two) times daily.  Marland Kitchen nystatin cream (MYCOSTATIN) Apply 1 application topically 2 (two) times daily.  Marland Kitchen senna-docusate (SENOKOT S) 8.6-50 MG tablet Take 1 tablet by mouth daily as needed for mild constipation.  . [DISCONTINUED] atorvastatin (LIPITOR) 80 MG tablet Take 1 tablet (80 mg total) by mouth at bedtime.  . [DISCONTINUED] clopidogrel (PLAVIX) 75 MG tablet TAKE 1 TABLET BY MOUTH EVERY DAY  . [  DISCONTINUED] DULoxetine (CYMBALTA) 30 MG capsule Take 1 capsule (30 mg total) by mouth daily.  . [DISCONTINUED] gabapentin (NEURONTIN) 300 MG capsule TAKE 1 CAPSULE BY MOUTH THREE TIMES A DAY  . [DISCONTINUED] Insulin Syringe-Needle U-100 (B-D INS SYR ULTRAFINE .3CC/31G) 31G X 5/16" 0.3 ML MISC Use to  administer insulin daily to control diabetes. 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  . [DISCONTINUED] LANTUS 100 UNIT/ML injection INJECT 50 UNITS INTO THE SKIN DAILY AT 10 PM.  . [DISCONTINUED] lisinopril (PRINIVIL,ZESTRIL) 10 MG tablet Take 1 tablet (10 mg total) by mouth daily.  . [DISCONTINUED] metFORMIN (GLUCOPHAGE-XR) 500 MG 24 hr tablet Take 2 tablets (1,000 mg total) by mouth daily with breakfast.  . [DISCONTINUED] nystatin cream (MYCOSTATIN) Apply 1 application topically 2 (two) times daily.  . [DISCONTINUED] senna-docusate (SENOKOT S) 8.6-50 MG tablet Take 1 tablet by mouth daily as needed for mild constipation. (Patient taking differently: Take 2 tablets by mouth at bedtime. )    PHQ 2/9 Scores 06/16/2017 03/29/2017 12/28/2016 09/26/2016  PHQ - 2 Score 0 0 0 1  PHQ- 9 Score - - 0 -    Physical Exam  Constitutional: She is oriented to person, place, and time. She appears well-developed and well-nourished.  HENT:  Head: Normocephalic.  Right Ear: External ear normal.  Left Ear: External ear normal.  Mouth/Throat: Oropharynx is clear and moist.  Eyes: Pupils are equal, round, and reactive to light. Conjunctivae and EOM are normal. Lids are everted and swept, no foreign bodies found. Left eye exhibits no hordeolum. No foreign body present in the left eye. Right conjunctiva is not injected. Left conjunctiva is not injected. No scleral icterus.  Neck: Normal range of motion. Neck supple. No JVD present. No tracheal deviation present. No thyromegaly present.  Cardiovascular: Normal rate, regular rhythm, normal heart sounds and intact distal pulses. Exam reveals no gallop and no friction rub.  No murmur heard. Pulmonary/Chest: Effort normal and breath sounds normal. No respiratory distress. She has no wheezes. She has no rales.  Abdominal: Soft. Bowel sounds are normal. She exhibits no mass. There is no hepatosplenomegaly. There is no tenderness. There is no  rebound and no guarding.  Musculoskeletal: Normal range of motion. She exhibits no edema or tenderness.  Lymphadenopathy:    She has no cervical adenopathy.  Neurological: She is alert and oriented to person, place, and time. She has normal strength. She displays normal reflexes. No cranial nerve deficit.  Skin: Skin is warm. No rash noted. There is erythema.  Left middle finger lateral aspect  Psychiatric: She has a normal mood and affect. Her mood appears not anxious. She does not exhibit a depressed mood.  Nursing note and vitals reviewed.   BP 128/80   Pulse 64   Ht 5\' 2"  (1.575 m)   Wt 166 lb (75.3 kg)   BMI 30.36 kg/m   Assessment and Plan:  1. Ingrown nail Start cephalexin/ send back to dermatology if not better after course of antibiotics - cephALEXin (KEFLEX) 500 MG capsule; Take 1 capsule (500 mg total) by mouth 4 (four) times daily.  Dispense: 20 capsule; Refill: 0  2. Hyperlipidemia, unspecified hyperlipidemia type Stable on med- continue atorvastatin - atorvastatin (LIPITOR) 80 MG tablet; Take 1 tablet (80 mg total) by mouth at bedtime.  Dispense: 90 tablet; Refill: 1  3. Major depressive 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, unspecified whether recurrent (HCC) Stable on meds- refill duloxetine - DULoxetine (CYMBALTA) 30 MG capsule; Take 1 capsule (30 mg  total) by mouth daily.  Dispense: 90 capsule; Refill: 1  4. Essential hypertension Stable on meds- continue lisinopril/ draw renal panel - lisinopril (PRINIVIL,ZESTRIL) 10 MG tablet; Take 1 tablet (10 mg total) by mouth daily.  Dispense: 90 tablet; Refill: 1 - Renal Function Panel  5. Type 2 diabetes mellitus without complication, with long-term current use of insulin (HCC) Continue metformin and lantus. Refilled needles and syringes/ draw A1C and renal panel- stable on meds - metFORMIN (GLUCOPHAGE-XR) 500 MG 24 hr tablet; Take 2 tablets (1,000 mg total) by mouth daily with breakfast.  Dispense: 180 tablet; Refill: 5 -  insulin glargine (LANTUS) 100 UNIT/ML injection; INJECT 50 UNITS INTO THE SKIN DAILY AT 10 PM.  Dispense: 20 mL; Refill: 5 - Insulin Syringe-Needle U-100 (B-D INS SYR ULTRAFINE .3CC/31G) 31G X 5/16" 0.3 ML MISC; Use to administer insulin daily to control diabetes. 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  Dispense: 100 each; Refill: 3 - Hemoglobin A1c - Renal Function Panel  6. Constipation, unspecified constipation type Advised to take 1 tablet daily as needed - senna-docusate (SENOKOT S) 8.6-50 MG tablet; Take 1 tablet by mouth daily as needed for mild constipation.  7. Candidiasis Prescribed nystatin cream for use under skin folds - nystatin cream (MYCOSTATIN); Apply 1 application topically 2 (two) times daily.  Dispense: 30 g; Refill: 0  8. Diabetic polyneuropathy associated with type 2 diabetes mellitus (HCC) Refill gabapentin- not needed at this time  9. Influenza vaccine needed administered - Flu vaccine HIGH DOSE PF   Dr. Elizabeth Sauer [facility]  12/18/2017

## 2017-12-19 LAB — RENAL FUNCTION PANEL
Albumin: 3.8 g/dL (ref 3.5–4.7)
BUN/Creatinine Ratio: 16 (ref 12–28)
BUN: 18 mg/dL (ref 8–27)
CO2: 25 mmol/L (ref 20–29)
CREATININE: 1.14 mg/dL — AB (ref 0.57–1.00)
Calcium: 9.1 mg/dL (ref 8.7–10.3)
Chloride: 100 mmol/L (ref 96–106)
GFR, EST AFRICAN AMERICAN: 50 mL/min/{1.73_m2} — AB (ref >59)
GFR, EST NON AFRICAN AMERICAN: 43 mL/min/{1.73_m2} — AB (ref >59)
GLUCOSE: 179 mg/dL — AB (ref 65–99)
PHOSPHORUS: 3.7 mg/dL (ref 2.5–4.5)
POTASSIUM: 4.7 mmol/L (ref 3.5–5.2)
SODIUM: 140 mmol/L (ref 134–144)

## 2017-12-19 LAB — HEMOGLOBIN A1C
ESTIMATED AVERAGE GLUCOSE: 131 mg/dL
HEMOGLOBIN A1C: 6.2 % — AB (ref 4.8–5.6)

## 2017-12-20 ENCOUNTER — Telehealth: Payer: Self-pay

## 2017-12-20 MED ORDER — TRAMADOL HCL 50 MG PO TABS: 50.0000 mg | ORAL_TABLET | Freq: Two times a day (BID) | ORAL | 0 refills | Status: DC

## 2017-12-20 NOTE — Telephone Encounter (Signed)
Called with pain in finger and will go to dermatology next week but needs meds for weekend special event.

## 2018-01-01 ENCOUNTER — Ambulatory Visit: Payer: Medicare Other | Admitting: Family Medicine

## 2018-01-18 ENCOUNTER — Ambulatory Visit: Payer: Medicare Other | Admitting: Family Medicine

## 2018-01-31 ENCOUNTER — Encounter (INDEPENDENT_AMBULATORY_CARE_PROVIDER_SITE_OTHER)

## 2018-01-31 ENCOUNTER — Ambulatory Visit (INDEPENDENT_AMBULATORY_CARE_PROVIDER_SITE_OTHER): Admitting: Vascular Surgery

## 2018-01-31 ENCOUNTER — Encounter (INDEPENDENT_AMBULATORY_CARE_PROVIDER_SITE_OTHER): Payer: Medicare Other

## 2018-01-31 ENCOUNTER — Encounter

## 2018-01-31 ENCOUNTER — Ambulatory Visit (INDEPENDENT_AMBULATORY_CARE_PROVIDER_SITE_OTHER): Payer: Medicare Other | Admitting: Vascular Surgery

## 2018-02-15 ENCOUNTER — Emergency Department
Admission: EM | Admit: 2018-02-15 | Discharge: 2018-02-15 | Disposition: A | Discharge status: Home / Self Care | Payer: Medicare Other | Attending: Emergency Medicine | Admitting: Emergency Medicine

## 2018-02-15 ENCOUNTER — Other Ambulatory Visit: Payer: Self-pay

## 2018-02-15 ENCOUNTER — Telehealth: Payer: Self-pay

## 2018-02-15 ENCOUNTER — Encounter: Payer: Self-pay | Admitting: Emergency Medicine

## 2018-02-15 DIAGNOSIS — Z7902 Long term (current) use of antithrombotics/antiplatelets: Secondary | ICD-10-CM | POA: Diagnosis not present

## 2018-02-15 DIAGNOSIS — R531 Weakness: Secondary | ICD-10-CM | POA: Diagnosis present

## 2018-02-15 DIAGNOSIS — R82998 Other abnormal findings in urine: Secondary | ICD-10-CM | POA: Diagnosis not present

## 2018-02-15 DIAGNOSIS — D649 Anemia, unspecified: Secondary | ICD-10-CM | POA: Diagnosis not present

## 2018-02-15 DIAGNOSIS — Z8673 Personal history of transient ischemic attack (TIA), and cerebral infarction without residual deficits: Secondary | ICD-10-CM | POA: Insufficient documentation

## 2018-02-15 DIAGNOSIS — R296 Repeated falls: Secondary | ICD-10-CM | POA: Diagnosis not present

## 2018-02-15 DIAGNOSIS — Z794 Long term (current) use of insulin: Secondary | ICD-10-CM | POA: Insufficient documentation

## 2018-02-15 DIAGNOSIS — N390 Urinary tract infection, site not specified: Secondary | ICD-10-CM | POA: Insufficient documentation

## 2018-02-15 DIAGNOSIS — Z7982 Long term (current) use of aspirin: Secondary | ICD-10-CM | POA: Insufficient documentation

## 2018-02-15 DIAGNOSIS — I10 Essential (primary) hypertension: Secondary | ICD-10-CM | POA: Diagnosis not present

## 2018-02-15 DIAGNOSIS — Z79899 Other long term (current) drug therapy: Secondary | ICD-10-CM | POA: Diagnosis not present

## 2018-02-15 DIAGNOSIS — E1140 Type 2 diabetes mellitus with diabetic neuropathy, unspecified: Secondary | ICD-10-CM | POA: Diagnosis not present

## 2018-02-15 DIAGNOSIS — Z87891 Personal history of nicotine dependence: Secondary | ICD-10-CM | POA: Insufficient documentation

## 2018-02-15 LAB — URINALYSIS, COMPLETE (UACMP) WITH MICROSCOPIC
BILIRUBIN URINE: NEGATIVE
Bacteria, UA: NONE SEEN
GLUCOSE, UA: NEGATIVE mg/dL
Hgb urine dipstick: NEGATIVE
Ketones, ur: NEGATIVE mg/dL
NITRITE: NEGATIVE
PH: 5 (ref 5.0–8.0)
Protein, ur: NEGATIVE mg/dL
SPECIFIC GRAVITY, URINE: 1.03 (ref 1.005–1.030)

## 2018-02-15 LAB — CBC
HCT: 28.6 % — ABNORMAL LOW (ref 36.0–46.0)
Hemoglobin: 8.8 g/dL — ABNORMAL LOW (ref 12.0–15.0)
MCH: 30.2 pg (ref 26.0–34.0)
MCHC: 30.8 g/dL (ref 30.0–36.0)
MCV: 98.3 fL (ref 80.0–100.0)
PLATELETS: 304 10*3/uL (ref 150–400)
RBC: 2.91 MIL/uL — AB (ref 3.87–5.11)
RDW: 13.3 % (ref 11.5–15.5)
WBC: 10.9 10*3/uL — AB (ref 4.0–10.5)
nRBC: 0 % (ref 0.0–0.2)

## 2018-02-15 LAB — BASIC METABOLIC PANEL
ANION GAP: 10 (ref 5–15)
BUN: 21 mg/dL (ref 8–23)
CALCIUM: 8.5 mg/dL — AB (ref 8.9–10.3)
CO2: 25 mmol/L (ref 22–32)
CREATININE: 0.81 mg/dL (ref 0.44–1.00)
Chloride: 103 mmol/L (ref 98–111)
Glucose, Bld: 152 mg/dL — ABNORMAL HIGH (ref 70–99)
Potassium: 3.6 mmol/L (ref 3.5–5.1)
SODIUM: 138 mmol/L (ref 135–145)

## 2018-02-15 MED ORDER — CEPHALEXIN 500 MG PO CAPS: 500.0000 mg | ORAL_CAPSULE | Freq: Three times a day (TID) | ORAL | 0 refills | Status: DC

## 2018-02-15 MED ORDER — IRON 325 (65 FE) MG PO TABS: 1.0000 | ORAL_TABLET | Freq: Two times a day (BID) | ORAL | 0 refills | Status: DC

## 2018-02-15 NOTE — ED Notes (Signed)
ED Provider at bedside. 

## 2018-02-15 NOTE — Discharge Instructions (Addendum)
Please seek medical attention for any high fevers, chest pain, shortness of breath, change in behavior, persistent vomiting, bloody stool or any other new or concerning symptoms.  

## 2018-02-15 NOTE — Telephone Encounter (Signed)
Pt called stating that she has had 3 falls in the past couple of days. Has increased her fluid intake. I explained to her that she still may not be drinking enough to get hydrated after having diarrhea and "not feeling well". May need IV fluids and or a scan to see if something is going on in the brain to cause these frequent falls, if not dehydrated. Advised to got to ER for further eval

## 2018-02-15 NOTE — ED Notes (Signed)
Pt given crackers and water. 

## 2018-02-15 NOTE — ED Triage Notes (Signed)
Says she has fallen 4 times since thanksgiving.  Fell at [employer] last night.  Says she is awake when she falls, but she just gets weak and her legs wont hold her up.

## 2018-02-15 NOTE — ED Notes (Signed)
Spoke with pt about wait times and what to expect next. Advised pt that I am available for further questions if needed.  

## 2018-02-16 NOTE — ED Provider Notes (Signed)
[facility] Emergency Department Provider Note  ____________________________________________   I have reviewed the triage vital signs and the nursing notes.   HISTORY  Chief Complaint Fall and Weakness   History limited by: Not Limited   HPI Judy Harvey is a 84 y.o. female who presents to the emergency department today because of concerns for weakness and falls.  The patient states that she has fallen roughly 4 times since Thanksgiving.  She states she does typically use the furniture in her house to get around and that is what she was doing today when she fell.  She feels like her legs become weak.  The patient denies any significant trauma from her fall.  The patient denies any recent fevers.  She denies any chest pain or palpitations. Denies any painful urination but states she has had bad odor.    Per medical record review patient has a history of DM, HLD, HTN.   Past Medical History:  Diagnosis Date  . Depression   . Diabetes mellitus without complication (HCC)   . GERD (gastroesophageal reflux disease)   . Hyperlipidemia   . Hypertension   . Legally blind   . Macular degeneration, bilateral   . Peripheral neuropathy   . Stroke [facility])     Patient Active Problem List   Diagnosis Date Noted  . Carotid stenosis, symptomatic w/o infarct, left 10/18/2017  . TIA (transient ischemic attack) 06/16/2017  . Carotid stenosis 06/16/2017  . Hyperlipidemia 06/16/2017  . Obesity (BMI 30.0-34.9) 05/02/2016  . Vitamin D deficiency 03/25/2016  . Controlled type 2 diabetes mellitus with diabetic neuropathy (HCC) 03/24/2016  . Old cerebrovascular accident (CVA) without late effect 03/24/2016  . Hypertension 03/24/2016  . Legally blind 03/24/2016  . Macular degeneration 03/24/2016  . Constipation 03/24/2016  . History of esophageal stricture 03/24/2016  . Depression with anxiety 03/24/2016  . Restless leg syndrome 03/24/2016  . Postherpetic neuralgia  03/24/2016  . Gait abnormality 03/24/2016    Past Surgical History:  Procedure Laterality Date  . ABDOMINAL HYSTERECTOMY     partial  . APPENDECTOMY    . BREAST SURGERY     reduction  . CAROTID PTA/STENT INTERVENTION Left 10/18/2017   Procedure: CAROTID PTA/STENT INTERVENTION;  Surgeon: Annice Needy, MD;  Location: [facility] INVASIVE CV LAB;  Service: Cardiovascular;  Laterality: Left;  . CATARACT EXTRACTION, BILATERAL    . CESAREAN SECTION     x3  . COLONOSCOPY    . ESOPHAGEAL DILATION      Prior to Admission medications   Medication Sig Start Date End Date Taking? Authorizing Provider  Acetaminophen (TYLENOL) 325 MG CAPS Take 2 tablets by mouth every 6 (six) hours as needed.     [provider]  aspirin EC 81 MG tablet Take 81 mg by mouth daily.    [provider]  atorvastatin (LIPITOR) 80 MG tablet Take 1 tablet (80 mg total) by mouth at bedtime. 12/18/17   Duanne Limerick, MD  Black Cohosh 540 MG CAPS Take 1 capsule by mouth daily.    [provider]  cephALEXin (KEFLEX) 500 MG capsule Take 1 capsule (500 mg total) by mouth 4 (four) times daily. 12/18/17   Duanne Limerick, MD  cephALEXin (KEFLEX) 500 MG capsule Take 1 capsule (500 mg total) by mouth 3 (three) times daily. 02/15/18   Phineas Semen, MD  Cholecalciferol (VITAMIN D3) 5000 units CAPS Take 1 capsule (5,000 Units total) by mouth daily. 06/28/16   Schuyler Amor, MD  clopidogrel (PLAVIX) 75 MG tablet Take 1 tablet (75 mg total) by mouth daily. 12/18/17   Duanne LimerickJones, Deanna C, MD  DULoxetine (CYMBALTA) 30 MG capsule Take 1 capsule (30 mg total) by mouth daily. 12/18/17   Duanne LimerickJones, Deanna C, MD  Ferrous Sulfate (IRON) 325 (65 Fe) MG TABS Take 1 tablet (325 mg total) by mouth 2 (two) times daily. 02/15/18   Phineas SemenGoodman, Joanthony Hamza, MD  gabapentin (NEURONTIN) 300 MG capsule TAKE 1 CAPSULE BY MOUTH THREE TIMES A DAY 12/18/17   Elizabeth SauerJones, Deanna C, MD  insulin glargine (LANTUS) 100 UNIT/ML injection INJECT 50 UNITS INTO THE SKIN  DAILY AT 10 PM. 12/18/17   Duanne LimerickJones, Deanna C, MD  Insulin Syringe-Needle U-100 (B-D INS SYR ULTRAFINE .3CC/31G) 31G X 5/16" 0.3 ML MISC Use to administer insulin daily to control diabetes. 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 12/18/17   Duanne LimerickJones, Deanna C, MD  lisinopril (PRINIVIL,ZESTRIL) 10 MG tablet Take 1 tablet (10 mg total) by mouth daily. 12/18/17   Duanne LimerickJones, Deanna C, MD  metFORMIN (GLUCOPHAGE-XR) 500 MG 24 hr tablet Take 2 tablets (1,000 mg total) by mouth daily with breakfast. 12/18/17   Duanne LimerickJones, Deanna C, MD  mupirocin ointment (BACTROBAN) 2 % Apply 1 application topically 2 (two) times daily. 11/06/17   Duanne LimerickJones, Deanna C, MD  nystatin cream (MYCOSTATIN) Apply 1 application topically 2 (two) times daily. 12/18/17   Duanne LimerickJones, Deanna C, MD  senna-docusate (SENOKOT S) 8.6-50 MG tablet Take 1 tablet by mouth daily as needed for mild constipation. 12/18/17   Duanne LimerickJones, Deanna C, MD  traMADol (ULTRAM) 50 MG tablet Take 1 tablet (50 mg total) by mouth 2 (two) times daily. 12/20/17   Duanne LimerickJones, Deanna C, MD    Allergies Adhesive [tape] and Lyrica [pregabalin]  Family History  Problem Relation Age of Onset  . Healthy Mother   . Diabetes Father   . Alzheimer's disease Father   . Multiple sclerosis Sister   . Multiple sclerosis Brother     Social History Social History   Tobacco Use  . Smoking status: Former Smoker    Packs/day: 3.00    Years: 50.00    Pack years: 150.00    Types: Cigarettes    Last attempt to quit: 2008    Years since quitting: 11.9  . Smokeless tobacco: Never Used  . Tobacco comment: smoking cessation materials not required  Substance Use Topics  . Alcohol use: No  . Drug use: No    Review of Systems Constitutional: No fever/chills Eyes: No visual changes. ENT: No sore throat. Cardiovascular: Denies chest pain. Respiratory: Denies shortness of breath. Gastrointestinal: No abdominal pain.  No nausea, no vomiting.  No diarrhea.   Genitourinary:  Negative for dysuria. Positive for bad odor.  Musculoskeletal: Negative for back pain. Skin: Negative for rash. Neurological: Negative for headaches. Occasional lower extremity weakness.   ____________________________________________   PHYSICAL EXAM:  VITAL SIGNS: ED Triage Vitals  Enc Vitals Group     BP 02/15/18 1144 135/63     Pulse Rate 02/15/18 1144 80     Resp 02/15/18 1144 12     Temp 02/15/18 1144 98.3 F (36.8 C)     Temp Source 02/15/18 1144 Oral     SpO2 02/15/18 1144 97 %     Weight --      Height 02/15/18 1147 5\' 2"  (1.575 m)     Head Circumference --      Peak Flow --      Pain Score  02/15/18 1147 0   Constitutional: Alert and oriented.  Eyes: Conjunctivae are normal.  ENT      Head: Normocephalic and atraumatic.      Nose: No congestion/rhinnorhea.      Mouth/Throat: Mucous membranes are moist.      Neck: No stridor. Hematological/Lymphatic/Immunilogical: No cervical lymphadenopathy. Cardiovascular: Normal rate, regular rhythm.  No murmurs, rubs, or gallops.  Respiratory: Normal respiratory effort without tachypnea nor retractions. Breath sounds are clear and equal bilaterally. No wheezes/rales/rhonchi. Gastrointestinal: Soft and non tender. No rebound. No guarding.  Rectal: No melena, no blood on glove. GUAIC negative Musculoskeletal: Normal range of motion in all extremities. No lower extremity edema. Neurologic:  Normal speech and language. No gross focal neurologic deficits are appreciated.  Skin:  Skin is warm, dry and intact. No rash noted. Psychiatric: Mood and affect are normal. Speech and behavior are normal. Patient exhibits appropriate insight and judgment.  ____________________________________________    LABS (pertinent positives/negatives)  CBC wbc 10.9, hgb 8.8, plt 304 BMP wnl except glu 152, ca 8.5 UA hazy, small leukocytes, 6-10 WBCs ____________________________________________   EKG  I, Phineas Semen, attending physician,  personally viewed and interpreted this EKG  EKG Time: 1148 Rate: 88 Rhythm: sinus rhythm Axis: left axis deviation Intervals: qtc 505 QRS: RBBB ST changes: no st elevation Impression: abnormal ekg   ____________________________________________    RADIOLOGY  None  ____________________________________________   PROCEDURES  Procedures  ____________________________________________   INITIAL IMPRESSION / ASSESSMENT AND PLAN / ED COURSE  Pertinent labs & imaging results that were available during my care of the patient were reviewed by me and considered in my medical decision making (see chart for details).   Patient presented to the emergency department today because of concerns for increasing falls.  Patient was found to be slightly anemic compared to previous.  Guaiac was negative.  Unclear what the cause of the anemia is at this point however did discuss this with patient will start on iron.  Discussed importance of following up with primary care.  Additionally patient's urine has some signs concerning for an infection.  Given the patient has been having bad odor will start on antibiotics.  Discussed return precautions.  ____________________________________________   FINAL CLINICAL IMPRESSION(S) / ED DIAGNOSES  Final diagnoses:  Weakness  Lower urinary tract infectious disease  Anemia, unspecified type     Note: This dictation was prepared with Dragon dictation. Any transcriptional errors that result from this process are unintentional     Phineas Semen, MD 02/16/18 1250

## 2018-02-19 ENCOUNTER — Ambulatory Visit: Payer: Medicare Other | Admitting: Family Medicine

## 2018-02-19 ENCOUNTER — Encounter: Payer: Self-pay | Admitting: Family Medicine

## 2018-02-19 VITALS — BP 120/68 | HR 80 | Ht 62.0 in | Wt 169.0 lb

## 2018-02-19 DIAGNOSIS — Z789 Other specified health status: Secondary | ICD-10-CM

## 2018-02-19 DIAGNOSIS — R198 Other specified symptoms and signs involving the digestive system and abdomen: Secondary | ICD-10-CM

## 2018-02-19 DIAGNOSIS — K5900 Constipation, unspecified: Secondary | ICD-10-CM

## 2018-02-19 DIAGNOSIS — I499 Cardiac arrhythmia, unspecified: Secondary | ICD-10-CM

## 2018-02-19 DIAGNOSIS — I679 Cerebrovascular disease, unspecified: Secondary | ICD-10-CM

## 2018-02-19 DIAGNOSIS — R1319 Other dysphagia: Secondary | ICD-10-CM

## 2018-02-19 DIAGNOSIS — R9431 Abnormal electrocardiogram [ECG] [EKG]: Secondary | ICD-10-CM

## 2018-02-19 DIAGNOSIS — R5383 Other fatigue: Secondary | ICD-10-CM

## 2018-02-19 DIAGNOSIS — D509 Iron deficiency anemia, unspecified: Secondary | ICD-10-CM | POA: Diagnosis not present

## 2018-02-19 DIAGNOSIS — R1310 Dysphagia, unspecified: Secondary | ICD-10-CM

## 2018-02-19 DIAGNOSIS — I10 Essential (primary) hypertension: Secondary | ICD-10-CM | POA: Diagnosis not present

## 2018-02-19 DIAGNOSIS — R110 Nausea: Secondary | ICD-10-CM

## 2018-02-19 DIAGNOSIS — E860 Dehydration: Secondary | ICD-10-CM

## 2018-02-19 DIAGNOSIS — E1142 Type 2 diabetes mellitus with diabetic polyneuropathy: Secondary | ICD-10-CM

## 2018-02-19 DIAGNOSIS — Z794 Long term (current) use of insulin: Secondary | ICD-10-CM

## 2018-02-19 DIAGNOSIS — E1140 Type 2 diabetes mellitus with diabetic neuropathy, unspecified: Secondary | ICD-10-CM

## 2018-02-19 DIAGNOSIS — R296 Repeated falls: Secondary | ICD-10-CM

## 2018-02-19 LAB — POCT URINALYSIS DIPSTICK
Bilirubin, UA: NEGATIVE
Blood, UA: NEGATIVE
Glucose, UA: NEGATIVE
Ketones, UA: NEGATIVE
Leukocytes, UA: NEGATIVE
Nitrite, UA: NEGATIVE
Protein, UA: NEGATIVE
SPEC GRAV UA: 1.01 (ref 1.010–1.025)
Urobilinogen, UA: 0.2 E.U./dL
pH, UA: 6 (ref 5.0–8.0)

## 2018-02-19 NOTE — Progress Notes (Signed)
Date:  02/19/2018   Name:  Vernell MorgansMildred J Petko   DOB:  1927-09-23   MRN:  [id number]   Chief Complaint: Follow-up (in the ER on 02/19/18 for falls- started on ferrous sulfate BID- only been taking it qday. Hgb was 8.8) Anemia  Presents for follow-up visit. Symptoms include confusion, light-headedness, malaise/fatigue and pallor. There has been no abdominal pain, anorexia, bruising/bleeding easily, fever, leg swelling, palpitations, paresthesias, pica or weight loss. Signs of blood loss that are not present include hematochezia and melena. (This is uncertain because of blindness)  There are no compliance problems.  Side effects of medications include fatigue and GI discomfort.  Constipation  This is a new problem. The current episode started more than 1 year ago. The problem has been waxing and waning since onset. Associated symptoms include diarrhea. Pertinent negatives include no abdominal pain, anorexia, back pain, bloating, difficulty urinating, fecal incontinence, fever, flatus, hematochezia, hemorrhoids, melena, nausea, rectal pain, vomiting or weight loss.     Review of Systems  Constitutional: Positive for malaise/fatigue. Negative for fever and weight loss.  Cardiovascular: Negative for palpitations.  Gastrointestinal: Positive for constipation and diarrhea. Negative for abdominal pain, anorexia, bloating, flatus, hematochezia, hemorrhoids, melena, nausea, rectal pain and vomiting.  Genitourinary: Negative for difficulty urinating.  Musculoskeletal: Negative for back pain.  Skin: Positive for pallor.  Neurological: Positive for light-headedness. Negative for paresthesias.  Hematological: Does not bruise/bleed easily.  Psychiatric/Behavioral: Positive for confusion.    Patient Active Problem List   Diagnosis Date Noted  . Carotid stenosis, symptomatic w/o infarct, left 10/18/2017  . TIA (transient ischemic attack) 06/16/2017  . Carotid stenosis 06/16/2017  . Hyperlipidemia  06/16/2017  . Obesity (BMI 30.0-34.9) 05/02/2016  . Vitamin D deficiency 03/25/2016  . Controlled type 2 diabetes mellitus with diabetic neuropathy (HCC) 03/24/2016  . Old cerebrovascular accident (CVA) without late effect 03/24/2016  . Hypertension 03/24/2016  . Legally blind 03/24/2016  . Macular degeneration 03/24/2016  . Constipation 03/24/2016  . History of esophageal stricture 03/24/2016  . Depression with anxiety 03/24/2016  . Restless leg syndrome 03/24/2016  . Postherpetic neuralgia 03/24/2016  . Gait abnormality 03/24/2016    Allergies  Allergen Reactions  . Adhesive [Tape] Other (See Comments)    "pulls my skin off" paper tape ok  . Lyrica [Pregabalin]     Body spasms    Past Surgical History:  Procedure Laterality Date  . ABDOMINAL HYSTERECTOMY     partial  . APPENDECTOMY    . BREAST SURGERY     reduction  . CAROTID PTA/STENT INTERVENTION Left 10/18/2017   Procedure: CAROTID PTA/STENT INTERVENTION;  Surgeon: Annice Needyew, Jason S, MD;  Location: [facility] INVASIVE CV LAB;  Service: Cardiovascular;  Laterality: Left;  . CATARACT EXTRACTION, BILATERAL    . CESAREAN SECTION     x3  . COLONOSCOPY    . ESOPHAGEAL DILATION      Social History   Tobacco Use  . Smoking status: Former Smoker    Packs/day: 3.00    Years: 50.00    Pack years: 150.00    Types: Cigarettes    Last attempt to quit: 2008    Years since quitting: 11.9  . Smokeless tobacco: Never Used  . Tobacco comment: smoking cessation materials not required  Substance Use Topics  . Alcohol use: No  . Drug use: No     Medication list has been reviewed and updated.  Current Meds  Medication Sig  . Acetaminophen (TYLENOL) 325 MG CAPS Take 2 tablets  by mouth every 6 (six) hours as needed.   Marland Kitchen aspirin EC 81 MG tablet Take 81 mg by mouth daily.  Marland Kitchen atorvastatin (LIPITOR) 80 MG tablet Take 1 tablet (80 mg total) by mouth at bedtime.  . Black Cohosh 540 MG CAPS Take 1 capsule by mouth daily.  . cephALEXin  (KEFLEX) 500 MG capsule Take 1 capsule (500 mg total) by mouth 3 (three) times daily.  . Cholecalciferol (VITAMIN D3) 5000 units CAPS Take 1 capsule (5,000 Units total) by mouth daily.  . clopidogrel (PLAVIX) 75 MG tablet Take 1 tablet (75 mg total) by mouth daily.  . DULoxetine (CYMBALTA) 30 MG capsule Take 1 capsule (30 mg total) by mouth daily.  . Ferrous Sulfate (IRON) 325 (65 Fe) MG TABS Take 1 tablet (325 mg total) by mouth 2 (two) times daily.  Marland Kitchen gabapentin (NEURONTIN) 300 MG capsule TAKE 1 CAPSULE BY MOUTH THREE TIMES A DAY  . insulin glargine (LANTUS) 100 UNIT/ML injection INJECT 50 UNITS INTO THE SKIN DAILY AT 10 PM.  . Insulin Syringe-Needle U-100 (B-D INS SYR ULTRAFINE .3CC/31G) 31G X 5/16" 0.3 ML MISC Use to administer insulin daily to control diabetes. 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  Marland Kitchen lisinopril (PRINIVIL,ZESTRIL) 10 MG tablet Take 1 tablet (10 mg total) by mouth daily.  . metFORMIN (GLUCOPHAGE-XR) 500 MG 24 hr tablet Take 2 tablets (1,000 mg total) by mouth daily with breakfast.  . mupirocin ointment (BACTROBAN) 2 % Apply 1 application topically 2 (two) times daily.  Marland Kitchen nystatin cream (MYCOSTATIN) Apply 1 application topically 2 (two) times daily.  Marland Kitchen senna-docusate (SENOKOT S) 8.6-50 MG tablet Take 1 tablet by mouth daily as needed for mild constipation.  . [DISCONTINUED] cephALEXin (KEFLEX) 500 MG capsule Take 1 capsule (500 mg total) by mouth 4 (four) times daily.  . [DISCONTINUED] traMADol (ULTRAM) 50 MG tablet Take 1 tablet (50 mg total) by mouth 2 (two) times daily.    PHQ 2/9 Scores 06/16/2017 03/29/2017 12/28/2016 09/26/2016  PHQ - 2 Score 0 0 0 1  PHQ- 9 Score - - 0 -    Physical Exam  BP 120/68   Pulse 80   Ht 5\' 2"  (1.575 m)   Wt 169 lb (76.7 kg)   BMI 30.91 kg/m   Assessment and Plan:  1. Iron deficiency anemia, unspecified iron deficiency anemia type Recent onset of suspected iron deficiency anemia with normal indices.   She has only taken 2 or 3 iron tablets check a ferritin level to verify iron deficiency and has been informed to continue at her present dose of iron supplement. - CBC w/Diff/Platelet - Ferritin  2. Constipation, unspecified constipation type Chronic.  Persistent.  Patient has had off-and-on constipation for 1 to 2 years.  Requiring stool softeners.  This represents a change in bowel habit and given possible anemia will consult gastroenterology for evaluation. - Ambulatory referral to Gastroenterology  3. Esophageal dysphagia Current.  See previous endoscopies to alleviate.  Patient has had return of symptoms of dysphasia Miller 2 episodes at [facility]  Will evaluate with gastroenterology. - Ambulatory referral to Gastroenterology  4. Fatigue, unspecified type New onset.  Patient has sudden fatigue with both legs giving out and near falls.  Irregular heart rate was noted review of EKG notes no atrial fibrillation but possible premature atrial beat will have cardiology to evaluate as well as evaluate blood pressure and whether we can decrease lisinopril. - Ambulatory referral to Cardiology - CBC w/Diff/Platelet - Ferritin  5. Irregular heart beat Patient to be evaluated by cardiology. - Ambulatory referral to Cardiology  6. Nausea Relatively new onset thought to be episodic with very rare to occasional need for Zofran.  Given new onset of anemia will have gastroenterology evaluate to rule out thick ulcer disease. - Ambulatory referral to Gastroenterology  7. Cerebrovascular disease Patient followed by by Dr. Wyn Quaker to  vascular for carotid stenosis.  Is only on aspirin and Plavix with concern for possible GI bleed.  Patient is to follow-up previously changed for evaluation of carotid concern. - Ambulatory referral to Cardiology  8. Essential hypertension Chronic.  Lower than her baseline.  Be due to concomitant hydration patient will be seen cardiology for regular heart rate decision  on whether we can decrease her lisinopril. - Ambulatory referral to Cardiology - Renal Function Panel - POCT Urinalysis Dipstick  9. Abnormal finding on EKG Noted and referred to cardiology - Ambulatory referral to Cardiology  10. Controlled type 2 diabetes mellitus with diabetic neuropathy, with long-term current use of insulin (HCC) Patient on metformin for diabetic control given nauseous and dehydration will check a renal panel determine current glucose and renal function.  A1c will be reevaluated. - Renal Function Panel - Hemoglobin A1c  11. Diabetic peripheral neuropathy (HCC) Given the nature of the nauseous patient has been instructed to discontinue her gabapentin to see if Tylenol can control her pain and if her nausea responds to that.  12. Dehydration Patient has increased thirst decreased mucous membranes and decreased blood pressure from baseline of instructed to continue to hydrate.  Specific gravity was noted to be 1010. - Renal Function Panel - POCT Urinalysis Dipstick  13. Change in bowel function Recent change in bowel function with increased frequency of constipation gastroenterology consult obtained.  14. Normal urinalysis Follow-up of urinary tract infection treated in the emergency room which notes improvement of urinalysis resolution of infection.  15. Frequent falls Patient's been experiencing increased falls at home.  This is without near syncopal or syncope.  Patient attributes this to sudden weakness and/or fatigue.   Dr. Hayden [facility]  02/19/2018

## 2018-02-20 ENCOUNTER — Other Ambulatory Visit: Payer: Self-pay

## 2018-02-20 DIAGNOSIS — R0602 Shortness of breath: Secondary | ICD-10-CM | POA: Insufficient documentation

## 2018-02-20 DIAGNOSIS — I10 Essential (primary) hypertension: Secondary | ICD-10-CM | POA: Insufficient documentation

## 2018-02-20 DIAGNOSIS — D509 Iron deficiency anemia, unspecified: Secondary | ICD-10-CM

## 2018-02-20 LAB — CBC WITH DIFFERENTIAL/PLATELET
Basophils Absolute: 0.1 10*3/uL (ref 0.0–0.2)
Basos: 1 %
EOS (ABSOLUTE): 0.4 10*3/uL (ref 0.0–0.4)
Eos: 4 %
Hematocrit: 26.3 % — ABNORMAL LOW (ref 34.0–46.6)
Hemoglobin: 8.5 g/dL — ABNORMAL LOW (ref 11.1–15.9)
IMMATURE GRANULOCYTES: 0 %
Immature Grans (Abs): 0 10*3/uL (ref 0.0–0.1)
Lymphocytes Absolute: 1.7 10*3/uL (ref 0.7–3.1)
Lymphs: 16 %
MCH: 29.9 pg (ref 26.6–33.0)
MCHC: 32.3 g/dL (ref 31.5–35.7)
MCV: 93 fL (ref 79–97)
MONOS ABS: 0.9 10*3/uL (ref 0.1–0.9)
Monocytes: 8 %
NEUTROS PCT: 71 %
Neutrophils Absolute: 7.7 10*3/uL — ABNORMAL HIGH (ref 1.4–7.0)
Platelets: 390 10*3/uL (ref 150–450)
RBC: 2.84 x10E6/uL — ABNORMAL LOW (ref 3.77–5.28)
RDW: 12.8 % (ref 12.3–15.4)
WBC: 10.7 10*3/uL (ref 3.4–10.8)

## 2018-02-20 LAB — RENAL FUNCTION PANEL
Albumin: 3.7 g/dL (ref 3.5–4.7)
BUN/Creatinine Ratio: 21 (ref 12–28)
BUN: 18 mg/dL (ref 8–27)
CO2: 25 mmol/L (ref 20–29)
Calcium: 9.2 mg/dL (ref 8.7–10.3)
Chloride: 100 mmol/L (ref 96–106)
Creatinine, Ser: 0.86 mg/dL (ref 0.57–1.00)
GFR calc non Af Amer: 61 mL/min/{1.73_m2} (ref >59)
GFR, EST AFRICAN AMERICAN: 70 mL/min/{1.73_m2} (ref >59)
Glucose: 169 mg/dL — ABNORMAL HIGH (ref 65–99)
Phosphorus: 3.7 mg/dL (ref 2.5–4.5)
Potassium: 4.5 mmol/L (ref 3.5–5.2)
Sodium: 140 mmol/L (ref 134–144)

## 2018-02-20 LAB — HEMOGLOBIN A1C
Est. average glucose Bld gHb Est-mCnc: 148 mg/dL
Hgb A1c MFr Bld: 6.8 % — ABNORMAL HIGH (ref 4.8–5.6)

## 2018-02-20 LAB — FERRITIN: Ferritin: 16 ng/mL (ref 15–150)

## 2018-02-21 ENCOUNTER — Ambulatory Visit (INDEPENDENT_AMBULATORY_CARE_PROVIDER_SITE_OTHER): Payer: Medicare Other | Admitting: Vascular Surgery

## 2018-02-21 ENCOUNTER — Ambulatory Visit (INDEPENDENT_AMBULATORY_CARE_PROVIDER_SITE_OTHER): Payer: Medicare Other

## 2018-02-21 ENCOUNTER — Encounter (INDEPENDENT_AMBULATORY_CARE_PROVIDER_SITE_OTHER): Payer: Self-pay | Admitting: Vascular Surgery

## 2018-02-21 VITALS — BP 124/61 | HR 69 | Resp 20 | Ht 62.0 in | Wt 169.0 lb

## 2018-02-21 DIAGNOSIS — I6522 Occlusion and stenosis of left carotid artery: Secondary | ICD-10-CM

## 2018-02-21 DIAGNOSIS — Z87891 Personal history of nicotine dependence: Secondary | ICD-10-CM

## 2018-02-21 DIAGNOSIS — I6523 Occlusion and stenosis of bilateral carotid arteries: Secondary | ICD-10-CM | POA: Diagnosis not present

## 2018-02-21 DIAGNOSIS — E1140 Type 2 diabetes mellitus with diabetic neuropathy, unspecified: Secondary | ICD-10-CM

## 2018-02-21 DIAGNOSIS — E785 Hyperlipidemia, unspecified: Secondary | ICD-10-CM

## 2018-02-21 DIAGNOSIS — Z794 Long term (current) use of insulin: Secondary | ICD-10-CM

## 2018-02-21 NOTE — Progress Notes (Signed)
Subjective:    Patient ID: Judy Harvey, female    DOB: February 28, 1928, 84 y.o.   MRN: [id number] Chief Complaint  Patient presents with  . Carotid    3 month follow up   Patient presents for 31-month carotid stenosis follow-up.  The patient is status post a left carotid artery stent placement on October 18, 2017.  The patient presents today without complaint. The stenosis has been followed by surveillance duplexes. The patient underwent a bilateral carotid duplex scan which showed no change from the previous exam on 11/01/17. Duplex is stable at known Right ICA occlusion and a Left ICA patent stent (1-39%).  Lateral vertebral arteries are antegrade.  Bilateral subclavian arteries are multiphasic and within normal limits.  The patient denies experiencing Amaurosis Fugax, TIA like symptoms or focal motor deficits.  Patient denies any fever, nausea vomiting.  Review of Systems  Constitutional: Negative.   HENT: Negative.   Eyes: Negative.   Respiratory: Negative.   Cardiovascular: Negative.   Gastrointestinal: Negative.   Endocrine: Negative.   Genitourinary: Negative.   Musculoskeletal: Negative.   Skin: Negative.   Allergic/Immunologic: Negative.   Neurological: Negative.   Hematological: Negative.   Psychiatric/Behavioral: Negative.       Objective:   Physical Exam  Constitutional: She is oriented to person, place, and time. She appears well-developed and well-nourished. No distress.  HENT:  Head: Normocephalic and atraumatic.  Right Ear: External ear normal.  Left Ear: External ear normal.  Eyes: Pupils are equal, round, and reactive to light. Conjunctivae and EOM are normal.  Neck: Normal range of motion.  No carotid bruits noted bilaterally  Cardiovascular: Normal rate, regular rhythm, normal heart sounds and intact distal pulses.  Pulses:      Radial pulses are 2+ on the right side, and 2+ on the left side.  Pulmonary/Chest: Effort normal and breath sounds normal.    Musculoskeletal: Normal range of motion. She exhibits edema (Mild bilateral lower extremity edema noted).  Neurological: She is alert and oriented to person, place, and time.  Skin: Skin is warm and dry. She is not diaphoretic.  Psychiatric: She has a normal mood and affect. Her behavior is normal. Judgment and thought content normal.  Vitals reviewed.  BP 124/61 (BP Location: Left Arm)   Pulse 69   Resp 20   Ht 5\' 2"  (1.575 m)   Wt 169 lb (76.7 kg)   BMI 30.91 kg/m   Past Medical History:  Diagnosis Date  . Depression   . Diabetes mellitus without complication (HCC)   . GERD (gastroesophageal reflux disease)   . Hyperlipidemia   . Hypertension   . Legally blind   . Macular degeneration, bilateral   . Peripheral neuropathy   . Stroke [facility])    Social History   Socioeconomic History  . Marital status: Widowed    Spouse name: Not on file  . Number of children: 3  . Years of education: some college  . Highest education level: Not on file  Occupational History  . Occupation: Disabled  Social Needs  . Financial resource strain: Not hard at all  . Food insecurity:    Worry: Never true    Inability: Never true  . Transportation needs:    Medical: Yes    Non-medical: Yes  Tobacco Use  . Smoking status: Former Smoker    Packs/day: 3.00    Years: 50.00    Pack years: 150.00    Types: Cigarettes    Last attempt  to quit: 2008    Years since quitting: 11.9  . Smokeless tobacco: Never Used  . Tobacco comment: smoking cessation materials not required  Substance and Sexual Activity  . Alcohol use: No  . Drug use: No  . Sexual activity: Never  Lifestyle  . Physical activity:    Days per week: 0 days    Minutes per session: 0 min  . Stress: Not at all  Relationships  . Social connections:    Talks on phone: Patient refused    Gets together: Patient refused    Attends religious service: Patient refused    Active member of club or organization: Patient refused     Attends meetings of clubs or organizations: Patient refused    Relationship status: Widowed  . Intimate partner violence:    Fear of current or ex partner: Patient refused    Emotionally abused: Patient refused    Physically abused: Patient refused    Forced sexual activity: Patient refused  Other Topics Concern  . Not on file  Social History Narrative  . Not on file   Past Surgical History:  Procedure Laterality Date  . ABDOMINAL HYSTERECTOMY     partial  . APPENDECTOMY    . BREAST SURGERY     reduction  . CAROTID PTA/STENT INTERVENTION Left 10/18/2017   Procedure: CAROTID PTA/STENT INTERVENTION;  Surgeon: Annice Needy, MD;  Location: [facility] INVASIVE CV LAB;  Service: Cardiovascular;  Laterality: Left;  . CATARACT EXTRACTION, BILATERAL    . CESAREAN SECTION     x3  . COLONOSCOPY    . ESOPHAGEAL DILATION     Family History  Problem Relation Age of Onset  . Healthy Mother   . Diabetes Father   . Alzheimer's disease Father   . Multiple sclerosis Sister   . Multiple sclerosis Brother    Allergies  Allergen Reactions  . Adhesive [Tape] Other (See Comments)    "pulls my skin off" paper tape ok  . Lyrica [Pregabalin]     Body spasms      Assessment & Plan:  Patient presents for 39-month carotid stenosis follow-up.  The patient is status post a left carotid artery stent placement on October 18, 2017.  The patient presents today without complaint. The stenosis has been followed by surveillance duplexes. The patient underwent a bilateral carotid duplex scan which showed no change from the previous exam on 11/01/17. Duplex is stable at known Right ICA occlusion and a Left ICA patent stent (1-39%).  Lateral vertebral arteries are antegrade.  Bilateral subclavian arteries are multiphasic and within normal limits.  The patient denies experiencing Amaurosis Fugax, TIA like symptoms or focal motor deficits.  Patient denies any fever, nausea vomiting.  1. Carotid stenosis, symptomatic w/o  infarct, left- Stable Studies reviewed with patient. Patient asymptomatic with stable duplex.  No intervention at this time.  Patient to return in six months for surveillance carotid duplex. Patient to continue medical optimization with ASA, Plavix and dyslipidemia medication. Patient to remain abstinent of tobacco use. I have discussed with the patient at length the risk factors for and pathogenesis of atherosclerotic disease and encouraged a healthy diet, regular exercise regimen and blood pressure / glucose control.  Patient was instructed to contact our office in the interim with problems such as arm / leg weakness or numbness, speech / swallowing difficulty or temporary monocular blindness. The patient expresses their understanding.   - VAS US CAROTID; Future  2. Controlled type 2 diabetes mellitus with diabetic  neuropathy, with long-term current use of insulin (HCC) - Stable Encouraged good control as its slows the progression of atherosclerotic disease  3. Hyperlipidemia, unspecified hyperlipidemia type - Stable On ASA and statin Encouraged good control as its slows the progression of atherosclerotic disease  Current Outpatient Medications on File Prior to Visit  Medication Sig Dispense Refill  . Acetaminophen (TYLENOL) 325 MG CAPS Take 2 tablets by mouth every 6 (six) hours as needed.     Marland Kitchen. aspirin EC 81 MG tablet Take 81 mg by mouth daily.    Marland Kitchen. atorvastatin (LIPITOR) 80 MG tablet Take 1 tablet (80 mg total) by mouth at bedtime. 90 tablet 1  . cephALEXin (KEFLEX) 500 MG capsule Take 1 capsule (500 mg total) by mouth 3 (three) times daily. 30 capsule 0  . Cholecalciferol (VITAMIN D3) 5000 units CAPS Take 1 capsule (5,000 Units total) by mouth daily. 30 capsule   . clopidogrel (PLAVIX) 75 MG tablet Take 1 tablet (75 mg total) by mouth daily. 90 tablet 1  . DULoxetine (CYMBALTA) 30 MG capsule Take 1 capsule (30 mg total) by mouth daily. 90 capsule 1  . Ferrous Sulfate (IRON) 325 (65  Fe) MG TABS Take 1 tablet (325 mg total) by mouth 2 (two) times daily. 30 each 0  . insulin glargine (LANTUS) 100 UNIT/ML injection INJECT 50 UNITS INTO THE SKIN DAILY AT 10 PM. 20 mL 5  . Insulin Syringe-Needle U-100 (B-D INS SYR ULTRAFINE .3CC/31G) 31G X 5/16" 0.3 ML MISC Use to administer insulin daily to control diabetes. 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 100 each 3  . lisinopril (PRINIVIL,ZESTRIL) 10 MG tablet Take 1 tablet (10 mg total) by mouth daily. 90 tablet 1  . metFORMIN (GLUCOPHAGE-XR) 500 MG 24 hr tablet Take 2 tablets (1,000 mg total) by mouth daily with breakfast. 180 tablet 5  . senna-docusate (SENOKOT S) 8.6-50 MG tablet Take 1 tablet by mouth daily as needed for mild constipation.    . Black Cohosh 540 MG CAPS Take 1 capsule by mouth daily.    . mupirocin ointment (BACTROBAN) 2 % Apply 1 application topically 2 (two) times daily. (Patient not taking: Reported on 02/21/2018) 22 g 0  . nystatin cream (MYCOSTATIN) Apply 1 application topically 2 (two) times daily. (Patient not taking: Reported on 02/21/2018) 30 g 0   No current facility-administered medications on file prior to visit.    There are no Patient Instructions on file for this visit. No follow-ups on file.  Tifany Hirsch A Liliah Dorian, PA-C

## 2018-02-28 ENCOUNTER — Telehealth: Payer: Self-pay | Admitting: *Deleted

## 2018-02-28 ENCOUNTER — Inpatient Hospital Stay: Payer: Medicare Other | Attending: Hematology and Oncology | Admitting: Hematology and Oncology

## 2018-02-28 DIAGNOSIS — D649 Anemia, unspecified: Secondary | ICD-10-CM | POA: Insufficient documentation

## 2018-02-28 NOTE — Progress Notes (Deleted)
[facility]-  Cancer Center  Clinic day:  02/28/2018  Chief Complaint: Judy Harvey is a 84 y.o. female with iron deficiency who is referred by Dr. Yetta Barre in consultation for assessment and management.  HPI:   She was admitted to [facility] from 10/18/2017 - 10/19/2017 secondary to symptomatic carotis artery stenosis. She has history of multiple TIAs with aphasia, confusion, and syncopal episodes.  She underwent left carotid artery stent placement on 10/18/2017 by Dr Wyn Quaker.  Estimated blood loss was 50 cc.  She tolerated the procedure well was transferred to the ICU overnight for observation.   The patient was seen in the [facility] ER on 02/15/2018 for falls and weakness.  She described 4 falls since Thanksgiving.  She noted that her legs were weak.   Evaluation revealed mild anemia.  She was started on oral iron.  She saw Dr. Elizabeth Sauer on 02/19/2018.  Notes reviewed.  She noted constipation and diarrhea.  She had symptoms of esophageal dysphagia.  She denied melena or hematochezia, bu difficult assessment secondary to patient's blindness.  She has been referred to GI.  Last colonoscopy.  Last EGD.  Labs reviewed: 10/17/2017:  Hematocrit 40.8, hemoglobin 13.7, MCV 92.8, platelets 273,000, WBC 10,800. 10/19/2017:  Hematocrit 32.0, hemoglobin 10.7, MCV 95, platelets 224,000, WBC 10,000. 02/15/2018:  Hematocrit 28.6, hemoglobin 8.8, MCV 98.3, platelets 304,000, WBC 10,900. 02/19/2018:  Hematocrit 26.3, hemoglobin 8.5, MCV 93, platelets 390,000, WBC 10,700.  ANC 7700.  Additional labs on 02/19/2018 included creatinine 0.86, calcium 9.2, and albumen 3.7.  Ferritin was 16.  Urinalysis on 02/19/2018 revealed no hematuria.  B12 was 356 on 03/29/2017. TSH was 1.221 on 03/29/2017.  Symptomatically,   Past Medical History:  Diagnosis Date  . Depression   . Diabetes mellitus without complication (HCC)   . GERD (gastroesophageal reflux disease)   . Hyperlipidemia   . Hypertension    . Legally blind   . Macular degeneration, bilateral   . Peripheral neuropathy   . Stroke [facility])     Past Surgical History:  Procedure Laterality Date  . ABDOMINAL HYSTERECTOMY     partial  . APPENDECTOMY    . BREAST SURGERY     reduction  . CAROTID PTA/STENT INTERVENTION Left 10/18/2017   Procedure: CAROTID PTA/STENT INTERVENTION;  Surgeon: Annice Needy, MD;  Location: [facility] INVASIVE CV LAB;  Service: Cardiovascular;  Laterality: Left;  . CATARACT EXTRACTION, BILATERAL    . CESAREAN SECTION     x3  . COLONOSCOPY    . ESOPHAGEAL DILATION      Family History  Problem Relation Age of Onset  . Healthy Mother   . Diabetes Father   . Alzheimer's disease Father   . Multiple sclerosis Sister   . Multiple sclerosis Brother     Social History:  reports that she quit smoking about 11 years ago. Her smoking use included cigarettes. She has a 150.00 pack-year smoking history. She has never used smokeless tobacco. She reports that she does not drink alcohol or use drugs.  The patient is accompanied by *** alone today.  Allergies:  Allergies  Allergen Reactions  . Adhesive [Tape] Other (See Comments)    "pulls my skin off" paper tape ok  . Lyrica [Pregabalin]     Body spasms    Current Medications: Current Outpatient Medications  Medication Sig Dispense Refill  . Acetaminophen (TYLENOL) 325 MG CAPS Take 2 tablets by mouth every 6 (six) hours as needed.     Marland Kitchen aspirin EC  81 MG tablet Take 81 mg by mouth daily.    Marland Kitchen atorvastatin (LIPITOR) 80 MG tablet Take 1 tablet (80 mg total) by mouth at bedtime. 90 tablet 1  . Black Cohosh 540 MG CAPS Take 1 capsule by mouth daily.    . cephALEXin (KEFLEX) 500 MG capsule Take 1 capsule (500 mg total) by mouth 3 (three) times daily. 30 capsule 0  . Cholecalciferol (VITAMIN D3) 5000 units CAPS Take 1 capsule (5,000 Units total) by mouth daily. 30 capsule   . clopidogrel (PLAVIX) 75 MG tablet Take 1 tablet (75 mg total) by mouth daily. 90 tablet 1  .  DULoxetine (CYMBALTA) 30 MG capsule Take 1 capsule (30 mg total) by mouth daily. 90 capsule 1  . Ferrous Sulfate (IRON) 325 (65 Fe) MG TABS Take 1 tablet (325 mg total) by mouth 2 (two) times daily. 30 each 0  . insulin glargine (LANTUS) 100 UNIT/ML injection INJECT 50 UNITS INTO THE SKIN DAILY AT 10 PM. 20 mL 5  . Insulin Syringe-Needle U-100 (B-D INS SYR ULTRAFINE .3CC/31G) 31G X 5/16" 0.3 ML MISC Use to administer insulin daily to control diabetes. 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 100 each 3  . lisinopril (PRINIVIL,ZESTRIL) 10 MG tablet Take 1 tablet (10 mg total) by mouth daily. 90 tablet 1  . metFORMIN (GLUCOPHAGE-XR) 500 MG 24 hr tablet Take 2 tablets (1,000 mg total) by mouth daily with breakfast. 180 tablet 5  . mupirocin ointment (BACTROBAN) 2 % Apply 1 application topically 2 (two) times daily. (Patient not taking: Reported on 02/21/2018) 22 g 0  . nystatin cream (MYCOSTATIN) Apply 1 application topically 2 (two) times daily. (Patient not taking: Reported on 02/21/2018) 30 g 0  . senna-docusate (SENOKOT S) 8.6-50 MG tablet Take 1 tablet by mouth daily as needed for mild constipation.     No current facility-administered medications for this visit.     Review of Systems:  GENERAL:  Feels good.  Active.  No fevers, sweats or weight loss. PERFORMANCE STATUS (ECOG):  *** HEENT:  No visual changes, runny nose, sore throat, mouth sores or tenderness. Lungs: No shortness of breath or cough.  No hemoptysis. Cardiac:  No chest pain, palpitations, orthopnea, or PND. GI:  No nausea, vomiting, diarrhea, constipation, melena or hematochezia. GU:  No urgency, frequency, dysuria, or hematuria. Musculoskeletal:  No back pain.  No joint pain.  No muscle tenderness. Extremities:  No pain or swelling. Skin:  No rashes or skin changes. Neuro:  No headache, numbness or weakness, balance or coordination issues. Endocrine:  No diabetes, thyroid issues, hot flashes or  night sweats. Psych:  No mood changes, depression or anxiety. Pain:  No focal pain. Review of systems:  All other systems reviewed and found to be negative.  Physical Exam: There were no vitals taken for this visit. GENERAL:  Well developed, well nourished, **man sitting comfortably in the exam room in no acute distress. MENTAL STATUS:  Alert and oriented to person, place and time. HEAD:  *** hair.  Normocephalic, atraumatic, face symmetric, no Cushingoid features. EYES:  *** eyes.  Pupils equal round and reactive to light and accomodation.  No conjunctivitis or scleral icterus. ENT:  Oropharynx clear without lesion.  Tongue normal. Mucous membranes moist.  RESPIRATORY:  Clear to auscultation without rales, wheezes or rhonchi. CARDIOVASCULAR:  Regular rate and rhythm without murmur, rub or gallop. ABDOMEN:  Soft, non-tender, with active bowel sounds, and no hepatosplenomegaly.  No masses. SKIN:  No  rashes, ulcers or lesions. EXTREMITIES: No edema, no skin discoloration or tenderness.  No palpable cords. LYMPH NODES: No palpable cervical, supraclavicular, axillary or inguinal adenopathy  NEUROLOGICAL: Unremarkable. PSYCH:  Appropriate.   No visits with results within 3 Day(s) from this visit.  Latest known visit with results is:  Office Visit on 02/19/2018  Component Date Value Ref Range Status  . WBC 02/19/2018 10.7  3.4 - 10.8 x10E3/uL Final  . RBC 02/19/2018 2.84* 3.77 - 5.28 x10E6/uL Final  . Hemoglobin 02/19/2018 8.5* 11.1 - 15.9 g/dL Final  . Hematocrit 16/12/9602 26.3* 34.0 - 46.6 % Final  . MCV 02/19/2018 93  79 - 97 fL Final  . MCH 02/19/2018 29.9  26.6 - 33.0 pg Final  . MCHC 02/19/2018 32.3  31.5 - 35.7 g/dL Final  . RDW 54/11/8117 12.8  12.3 - 15.4 % Final   Comment: **Effective March 19, 2018, the RDW pediatric reference**   interval will be removed and the adult reference interval   will be changing to:                             Female 11.7 - 15.4                                                       Female 11.6 - 15.4   . Platelets 02/19/2018 390  150 - 450 x10E3/uL Final  . Neutrophils 02/19/2018 71  Not Estab. % Final  . Lymphs 02/19/2018 16  Not Estab. % Final  . Monocytes 02/19/2018 8  Not Estab. % Final  . Eos 02/19/2018 4  Not Estab. % Final  . Basos 02/19/2018 1  Not Estab. % Final  . Neutrophils Absolute 02/19/2018 7.7* 1.4 - 7.0 x10E3/uL Final  . Lymphocytes Absolute 02/19/2018 1.7  0.7 - 3.1 x10E3/uL Final  . Monocytes Absolute 02/19/2018 0.9  0.1 - 0.9 x10E3/uL Final  . EOS (ABSOLUTE) 02/19/2018 0.4  0.0 - 0.4 x10E3/uL Final  . Basophils Absolute 02/19/2018 0.1  0.0 - 0.2 x10E3/uL Final  . Immature Granulocytes 02/19/2018 0  Not Estab. % Final  . Immature Grans (Abs) 02/19/2018 0.0  0.0 - 0.1 x10E3/uL Final  . Ferritin 02/19/2018 16  15 - 150 ng/mL Final  . Glucose 02/19/2018 169* 65 - 99 mg/dL Final  . BUN 14/78/2956 18  8 - 27 mg/dL Final  . Creatinine, Ser 02/19/2018 0.86  0.57 - 1.00 mg/dL Final  . GFR calc non Af Amer 02/19/2018 61  >59 mL/min/1.73 Final  . GFR calc Af Amer 02/19/2018 70  >59 mL/min/1.73 Final  . BUN/Creatinine Ratio 02/19/2018 21  12 - 28 Final  . Sodium 02/19/2018 140  134 - 144 mmol/L Final  . Potassium 02/19/2018 4.5  3.5 - 5.2 mmol/L Final  . Chloride 02/19/2018 100  96 - 106 mmol/L Final  . CO2 02/19/2018 25  20 - 29 mmol/L Final  . Calcium 02/19/2018 9.2  8.7 - 10.3 mg/dL Final  . Phosphorus 21/30/8657 3.7  2.5 - 4.5 mg/dL Final  . Albumin 84/69/6295 3.7  3.5 - 4.7 g/dL Final  . Hgb M8U MFr Bld 02/19/2018 6.8* 4.8 - 5.6 % Final   Comment:          Prediabetes: 5.7 - 6.4  Diabetes: >6.4          Glycemic control for adults with diabetes: <7.0   . Est. average glucose Bld gHb Est-m* 02/19/2018 148  mg/dL Final  . Color, UA 16/10/960412/11/2017 gold   Final  . Clarity, UA 02/19/2018 cloudy   Final  . Glucose, UA 02/19/2018 Negative  Negative Final  . Bilirubin, UA 02/19/2018 negative   Final  . Ketones,  UA 02/19/2018 negative   Final  . Spec Grav, UA 02/19/2018 1.010  1.010 - 1.025 Final  . Blood, UA 02/19/2018 negative   Final  . pH, UA 02/19/2018 6.0  5.0 - 8.0 Final  . Protein, UA 02/19/2018 Negative  Negative Final  . Urobilinogen, UA 02/19/2018 0.2  0.2 or 1.0 E.U./dL Final  . Nitrite, UA 54/09/811912/11/2017 negative   Final  . Leukocytes, UA 02/19/2018 Negative  Negative Final  . Appearance 02/19/2018 yellow   Final  . Odor 02/19/2018 none   Final    Assessment:  Judy Harvey is a 84 y.o. female with a progressive normocytic anemia since 10/2017.  She has iron deficiency anemia.  Ferritin was 16 on 02/19/2018.  Plan: 1.  Labs today:  CBC with diff, retic, iron studies, B12, folate, TSH, LFTs, hold tube. 2.   3.   4.    Rosey BathMelissa C , MD  02/28/2018, 3:14 AM   I saw and evaluated the patient, participating in the key portions of the service and reviewing pertinent diagnostic studies and records.  I reviewed the nurse practitioner's note and agree with the findings and the plan.  The assessment and plan were discussed with the patient.  Additional diagnostic studies of *** are needed to clarify *** and would change the clinical management.  A few ***multiple questions were asked by the patient and answered.   Nelva NayMelissa , MD 02/28/2018,3:14 AM

## 2018-02-28 NOTE — Telephone Encounter (Signed)
Patient had an appointment with MD today (02-28-18) @ 9:00 am.  Patient did not show.  Called patient and LVM to inquire if she is okay and advised her to call back to reschedule.

## 2018-03-12 ENCOUNTER — Telehealth: Payer: Self-pay | Admitting: *Deleted

## 2018-03-12 NOTE — Telephone Encounter (Signed)
Called patient again and had to leave voice message  Dr. Elizabeth Sauereanna Jones referred patient for anemia.  Patient did dnot show for appointment.  Left voice message that patient should call to reschedule.  (This is the 2nd voice message that has been left for patient).

## 2018-03-13 ENCOUNTER — Telehealth: Payer: Self-pay

## 2018-03-13 ENCOUNTER — Telehealth: Payer: Self-pay | Admitting: *Deleted

## 2018-03-13 NOTE — Telephone Encounter (Signed)
Spoke to [facility] regarding [employer] appointments. Advised they MUST see Hematology to determine what to do about Anemia and if this has anything to do with heart rate issues. Loraine LericheMark denies having told patient that he can not take her to appointments and fears she may be trying to get out of seeing specialists. He will get with her today and reschedule all specialists appointments and he will call us if any issues.

## 2018-03-13 NOTE — Telephone Encounter (Signed)
  Please notify Dr Yetta BarreJones.  M

## 2018-03-13 NOTE — Telephone Encounter (Signed)
Called Dr, Elizabeth Sauereanna Jones' RN to make her aware that patient did not show for her appointment after being referred for anemia.  I have called patient twice and left voice messages that she should call and reschedule. I was informed this morning that patient called Dr. Yetta BarreJones' office yesterday to get the [place] [facility] phone number.  At this time I have been unable to speak with scheduler in [place] to determine if she did call to reschedule. Will follow up later today to determine.

## 2018-03-19 ENCOUNTER — Telehealth: Payer: Self-pay

## 2018-03-19 NOTE — Telephone Encounter (Signed)
Pt called wanting to discuss the holter monitor results that was ordered by Gwen Pounds. I spoke to [place] at [facility] office and she explained that she had advised pt to come in and discuss findings with Gwen Pounds due to being able to explain things in detail. Pt wanted Dr Yetta Barre to do this. I explained that she needed to call and get appt with Dr Gwen Pounds, as he is the specialist and can discuss in details what showed on holter monitor

## 2018-03-21 DIAGNOSIS — I471 Supraventricular tachycardia: Secondary | ICD-10-CM | POA: Insufficient documentation

## 2018-03-21 DIAGNOSIS — I493 Ventricular premature depolarization: Secondary | ICD-10-CM | POA: Insufficient documentation

## 2018-03-28 ENCOUNTER — Inpatient Hospital Stay: Payer: Medicare Other

## 2018-03-28 ENCOUNTER — Encounter: Payer: Self-pay | Admitting: Hematology and Oncology

## 2018-03-28 ENCOUNTER — Inpatient Hospital Stay: Payer: Medicare Other | Attending: Hematology and Oncology | Admitting: Hematology and Oncology

## 2018-03-28 VITALS — BP 122/78 | Temp 97.9°F | Resp 18 | Ht 62.0 in | Wt 167.4 lb

## 2018-03-28 DIAGNOSIS — Z79899 Other long term (current) drug therapy: Secondary | ICD-10-CM | POA: Diagnosis not present

## 2018-03-28 DIAGNOSIS — D509 Iron deficiency anemia, unspecified: Secondary | ICD-10-CM | POA: Diagnosis not present

## 2018-03-28 DIAGNOSIS — I10 Essential (primary) hypertension: Secondary | ICD-10-CM | POA: Diagnosis not present

## 2018-03-28 DIAGNOSIS — Z87891 Personal history of nicotine dependence: Secondary | ICD-10-CM | POA: Insufficient documentation

## 2018-03-28 DIAGNOSIS — E538 Deficiency of other specified B group vitamins: Secondary | ICD-10-CM

## 2018-03-28 DIAGNOSIS — Z7982 Long term (current) use of aspirin: Secondary | ICD-10-CM | POA: Insufficient documentation

## 2018-03-28 DIAGNOSIS — E119 Type 2 diabetes mellitus without complications: Secondary | ICD-10-CM | POA: Diagnosis not present

## 2018-03-28 DIAGNOSIS — N390 Urinary tract infection, site not specified: Secondary | ICD-10-CM

## 2018-03-28 DIAGNOSIS — F329 Major depressive disorder, single episode, unspecified: Secondary | ICD-10-CM

## 2018-03-28 DIAGNOSIS — D649 Anemia, unspecified: Secondary | ICD-10-CM

## 2018-03-28 DIAGNOSIS — Z8673 Personal history of transient ischemic attack (TIA), and cerebral infarction without residual deficits: Secondary | ICD-10-CM

## 2018-03-28 LAB — TSH: TSH: 2.323 u[IU]/mL (ref 0.350–4.500)

## 2018-03-28 LAB — IRON AND TIBC
Iron: 129 ug/dL (ref 28–170)
Saturation Ratios: 34 % — ABNORMAL HIGH (ref 10.4–31.8)
TIBC: 378 ug/dL (ref 250–450)
UIBC: 249 ug/dL

## 2018-03-28 LAB — FOLATE: Folate: 13.8 ng/mL (ref >5.9)

## 2018-03-28 LAB — VITAMIN B12: Vitamin B-12: 272 pg/mL (ref 180–914)

## 2018-03-28 NOTE — Progress Notes (Signed)
[facility]-  Cancer Center  Clinic day:  03/28/2018  Chief Complaint: Judy Harvey is a 85 y.o. female with iron deficiency who is referred in consultation by Dr. Elizabeth Sauer for assessment and management.  HPI:  The patient was seen in the [facility] ER on 02/15/2018 for weakness and falls. She had fallen 4 times since Thanksgiving.  She typically use the furniture in her house to get around.  She was found to be anemic.  Guaiac was negative.  She was started on oral iron.  She was started on antibiotics for a lower UTI.  CBC revealed a hematocrit of 28.6, hemoglobin 8.8, MCV 98.3, platelets 304,000, WBC 10,000.  Creatinine was 0.81.  She was seen by Dr. Yetta Barre on 02/19/2018.  She was noted to have confusion, lightheadedness, fatigue/malaise, and pallor.  She noted constipation and diarrhea.  She denied melena or hematochezia although she is blind.  She was taking oral iron daily.  CBC revealed a hematocrit of 26.3, hemoglobin 8.5, MCV 93, platelets 390,000, WBC 10,700 with an ANC of 7700.  Ferritin was 16.  Creatinine was 0.86.  Prior labs: 10/17/2017:  hematocrit 40.8, hemoglobin 13.7, and MCV 92.8. 10/19/2017:  hematocrit 32.0, hemoglobin 10.7, and MCV 95.0.  Symptomatically, patient is feeling "better". She attributes her improvement to the addition of daily iron supplement, which she has been on for about 1 month. Patient denies that she has experienced any fevers or significant weight loss. Patient has intermitent sweats at night, however notes that they are not drenching in nature.  She denies any interval infections. She denies nausea, vomiting, and diarrhea. Patient is chronically constipated and uses daily stool softeners. Patient states, "when I was really sick, my stools were really black". Patient denies current bleeding; no hematochezia, melena, or gross hematuria. Of note, patient is legally blind and unable to appreciate obvious GI/GU bleeding. Last colonoscopy  was "when I had E.coli back in the 80s".   Patient advises that she maintains an adequate appetite. She is eating well. Patient maintains a diet rich in iron. She indicates that she eats meat and green leafy vegetables on a consistent basis. Weight today is 167 lb 6 oz (75.9 kg). She notes a several week history of ice pica that resolved when she started the oral iron. Patient has has restless leg symptoms "for a long time".   Patient denies pain in the clinic today.  Patient denies family story that is significant for any type of oncologic or hematologic disorder.    Past Medical History:  Diagnosis Date  . Depression   . Diabetes mellitus without complication (HCC)   . GERD (gastroesophageal reflux disease)   . Hyperlipidemia   . Hypertension   . Legally blind   . Macular degeneration, bilateral   . Peripheral neuropathy   . Stroke [facility])     Past Surgical History:  Procedure Laterality Date  . ABDOMINAL HYSTERECTOMY     partial  . APPENDECTOMY    . BREAST SURGERY     reduction  . CAROTID PTA/STENT INTERVENTION Left 10/18/2017   Procedure: CAROTID PTA/STENT INTERVENTION;  Surgeon: Annice Needy, MD;  Location: [facility] INVASIVE CV LAB;  Service: Cardiovascular;  Laterality: Left;  . CATARACT EXTRACTION, BILATERAL    . CESAREAN SECTION     x3  . COLONOSCOPY    . ESOPHAGEAL DILATION      Family History  Problem Relation Age of Onset  . Healthy Mother   . Diabetes Father   .  Alzheimer's disease Father   . Multiple sclerosis Sister   . Multiple sclerosis Brother     Social History:  reports that she quit smoking about 12 years ago. Her smoking use included cigarettes. She has a 150.00 pack-year smoking history. She has never used smokeless tobacco. She reports that she does not drink alcohol or use drugs.  Her nickname is Millie. Patient is retired from [employer], corporatesecretarial work. The patient is accompanied by her son, Loraine LericheMark,  today.  Allergies:  Allergies  Allergen Reactions  .  Adhesive [Tape] Other (See Comments)    "pulls my skin off" paper tape ok  . Lyrica [Pregabalin]     Body spasms    Current Medications: Current Outpatient Medications  Medication Sig Dispense Refill  . aspirin EC 81 MG tablet Take 81 mg by mouth daily.    Marland Kitchen. atorvastatin (LIPITOR) 80 MG tablet Take 1 tablet (80 mg total) by mouth at bedtime. 90 tablet 1  . Cholecalciferol (VITAMIN D3) 5000 units CAPS Take 1 capsule (5,000 Units total) by mouth daily. 30 capsule   . clopidogrel (PLAVIX) 75 MG tablet Take 1 tablet (75 mg total) by mouth daily. 90 tablet 1  . DULoxetine (CYMBALTA) 30 MG capsule Take 1 capsule (30 mg total) by mouth daily. 90 capsule 1  . Ferrous Sulfate (IRON) 325 (65 Fe) MG TABS Take 1 tablet (325 mg total) by mouth 2 (two) times daily. 30 each 0  . gabapentin (NEURONTIN) 300 MG capsule Take by mouth.    . insulin glargine (LANTUS) 100 UNIT/ML injection INJECT 50 UNITS INTO THE SKIN DAILY AT 10 PM. 20 mL 5  . Insulin Syringe-Needle U-100 (B-D INS SYR ULTRAFINE .3CC/31G) 31G X 5/16" 0.3 ML MISC Use to administer insulin daily to control diabetes. 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 100 each 3  . lisinopril (PRINIVIL,ZESTRIL) 10 MG tablet Take 1 tablet (10 mg total) by mouth daily. 90 tablet 1  . metFORMIN (GLUCOPHAGE-XR) 500 MG 24 hr tablet Take 2 tablets (1,000 mg total) by mouth daily with breakfast. 180 tablet 5  . pantoprazole (PROTONIX) 40 MG tablet Take by mouth.    . senna-docusate (SENOKOT S) 8.6-50 MG tablet Take 1 tablet by mouth daily as needed for mild constipation.    . Acetaminophen (TYLENOL) 325 MG CAPS Take 2 tablets by mouth every 6 (six) hours as needed.     . Black Cohosh 540 MG CAPS Take 1 capsule by mouth daily.    . cephALEXin (KEFLEX) 500 MG capsule Take 1 capsule (500 mg total) by mouth 3 (three) times daily. (Patient not taking: Reported on 03/28/2018) 30 capsule 0  . mupirocin ointment (BACTROBAN) 2 % Apply 1  application topically 2 (two) times daily. (Patient not taking: Reported on 02/21/2018) 22 g 0  . nystatin cream (MYCOSTATIN) Apply 1 application topically 2 (two) times daily. (Patient not taking: Reported on 02/21/2018) 30 g 0   No current facility-administered medications for this visit.     Review of Systems:  GENERAL:  Feeling "better".  No fevers.  Intermittent sweats at night.  No weight loss. PERFORMANCE STATUS (ECOG):  2 HEENT:  Legally blind.  No runny nose, sore throat, mouth sores or tenderness. Lungs: Shortness of breath with exertion.  No cough.  No hemoptysis. Cardiac:  No chest pain, palpitations, orthopnea, or PND. GI:  Constipation.  h/o black stools.  No nausea, vomiting, diarrhea, or hematochezia.  Pica, resolved.  Last colonoscopy in the 1980's. GU:  No urgency, frequency, dysuria, or hematuria. Musculoskeletal:  No back pain.  No joint pain.  No muscle tenderness. Extremities:  No pain or swelling. Skin:  No rashes or skin changes. Neuro:  Restless legs.  Neuropathy.  No headache, focal weakness, balance or coordination issues. Endocrine:  Diabetes.  No thyroid issues, hot flashes or night sweats. Psych:  No mood changes, depression or anxiety. Pain:  No focal pain. Review of systems:  All other systems reviewed and found to be negative.  Physical Exam: Blood pressure 122/78, temperature 97.9 F (36.6 C), temperature source Tympanic, resp. rate 18, height 5\' 2"  (1.575 m), weight 167 lb 6 oz (75.9 kg), SpO2 96 %. GENERAL:  Well developed, well nourished, elderly woman sitting comfortably in the exam room in no acute distress.  She requires assistance onto the exam table. MENTAL STATUS:  Alert and oriented to person, place and time. HEAD:  Curly hair.  Normocephalic, atraumatic, face symmetric, no Cushingoid features. EYES:  Glasses.  Brown eyes.  Pupils equal round and reactive to light and accomodation.  No conjunctivitis or scleral icterus. ENT:  Oropharynx clear  without lesion.  Tongue normal. Mucous membranes moist.  RESPIRATORY:  Clear to auscultation without rales, wheezes or rhonchi. CARDIOVASCULAR:  Regular rate and rhythm without murmur, rub or gallop. ABDOMEN:  Soft, non-tender, with active bowel sounds, and no hepatosplenomegaly.  No masses. SKIN:  No rashes, ulcers or lesions. EXTREMITIES: No edema, no skin discoloration or tenderness.  No palpable cords. LYMPH NODES: No palpable cervical, supraclavicular, axillary or inguinal adenopathy  NEUROLOGICAL: Unremarkable. PSYCH:  Appropriate.   No visits with results within 3 Day(s) from this visit.  Latest known visit with results is:  Office Visit on 02/19/2018  Component Date Value Ref Range Status  . WBC 02/19/2018 10.7  3.4 - 10.8 x10E3/uL Final  . RBC 02/19/2018 2.84* 3.77 - 5.28 x10E6/uL Final  . Hemoglobin 02/19/2018 8.5* 11.1 - 15.9 g/dL Final  . Hematocrit 62/95/2841 26.3* 34.0 - 46.6 % Final  . MCV 02/19/2018 93  79 - 97 fL Final  . MCH 02/19/2018 29.9  26.6 - 33.0 pg Final  . MCHC 02/19/2018 32.3  31.5 - 35.7 g/dL Final  . RDW 32/44/0102 12.8  12.3 - 15.4 % Final   Comment: **Effective March 19, 2018, the RDW pediatric reference**   interval will be removed and the adult reference interval   will be changing to:                             Female 11.7 - 15.4                                                      Female 11.6 - 15.4   . Platelets 02/19/2018 390  150 - 450 x10E3/uL Final  . Neutrophils 02/19/2018 71  Not Estab. % Final  . Lymphs 02/19/2018 16  Not Estab. % Final  . Monocytes 02/19/2018 8  Not Estab. % Final  . Eos 02/19/2018 4  Not Estab. % Final  . Basos 02/19/2018 1  Not Estab. % Final  . Neutrophils Absolute 02/19/2018 7.7* 1.4 - 7.0 x10E3/uL Final  . Lymphocytes Absolute 02/19/2018 1.7  0.7 - 3.1 x10E3/uL Final  . Monocytes Absolute 02/19/2018 0.9  0.1 - 0.9  x10E3/uL Final  . EOS (ABSOLUTE) 02/19/2018 0.4  0.0 - 0.4 x10E3/uL Final  . Basophils Absolute  02/19/2018 0.1  0.0 - 0.2 x10E3/uL Final  . Immature Granulocytes 02/19/2018 0  Not Estab. % Final  . Immature Grans (Abs) 02/19/2018 0.0  0.0 - 0.1 x10E3/uL Final  . Ferritin 02/19/2018 16  15 - 150 ng/mL Final  . Glucose 02/19/2018 169* 65 - 99 mg/dL Final  . BUN 16/12/9602 18  8 - 27 mg/dL Final  . Creatinine, Ser 02/19/2018 0.86  0.57 - 1.00 mg/dL Final  . GFR calc non Af Amer 02/19/2018 61  >59 mL/min/1.73 Final  . GFR calc Af Amer 02/19/2018 70  >59 mL/min/1.73 Final  . BUN/Creatinine Ratio 02/19/2018 21  12 - 28 Final  . Sodium 02/19/2018 140  134 - 144 mmol/L Final  . Potassium 02/19/2018 4.5  3.5 - 5.2 mmol/L Final  . Chloride 02/19/2018 100  96 - 106 mmol/L Final  . CO2 02/19/2018 25  20 - 29 mmol/L Final  . Calcium 02/19/2018 9.2  8.7 - 10.3 mg/dL Final  . Phosphorus 54/11/8117 3.7  2.5 - 4.5 mg/dL Final  . Albumin 14/78/2956 3.7  3.5 - 4.7 g/dL Final  . Hgb O1H MFr Bld 02/19/2018 6.8* 4.8 - 5.6 % Final   Comment:          Prediabetes: 5.7 - 6.4          Diabetes: >6.4          Glycemic control for adults with diabetes: <7.0   . Est. average glucose Bld gHb Est-m* 02/19/2018 148  mg/dL Final  . Color, UA 08/65/7846 gold   Final  . Clarity, UA 02/19/2018 cloudy   Final  . Glucose, UA 02/19/2018 Negative  Negative Final  . Bilirubin, UA 02/19/2018 negative   Final  . Ketones, UA 02/19/2018 negative   Final  . Spec Grav, UA 02/19/2018 1.010  1.010 - 1.025 Final  . Blood, UA 02/19/2018 negative   Final  . pH, UA 02/19/2018 6.0  5.0 - 8.0 Final  . Protein, UA 02/19/2018 Negative  Negative Final  . Urobilinogen, UA 02/19/2018 0.2  0.2 or 1.0 E.U./dL Final  . Nitrite, UA 96/29/5284 negative   Final  . Leukocytes, UA 02/19/2018 Negative  Negative Final  . Appearance 02/19/2018 yellow   Final  . Odor 02/19/2018 none   Final    Assessment:  CECILEE ROSNER is a 85 y.o. female with iron deficiency anemia.  Diet appears good.  She has been on oral iron x 1 month.  Labs on  02/19/2018 revealed a hematocrit of 26.3, hemoglobin 8.5, and MCV 93.  Ferritin was 16.  Creatinine was 0.86.  She has not had a colonoscopy since the 1980s.  She notes a history of black stools. Patient denies any current hematochezia, melena, or gross hematuria. However, she is legally blind and unable to appreciate obvious GI/GU bleeding.   Symptomatically, she is feeling better since initiation of oral iron.  Exam is unremarkable.  Plan: 1.  Labs today:  iron studies, B12, folate, TSH. 2.  Iron deficiency anemia  Discuss diagnosis of anemia.  Iron stores are low.    Discuss h/o black stools (possible melena) indicating upper GI source.  Discuss RBCs typically microcytic in iron deficiency.  Discuss RBCs are normocytic.   She may have another etiology of her anemia.   Check B12, folate, and TSH.  Discuss continuation of oral iron with OJ or vitamin C  to help with absorption.  Discuss iron rich foods.  Discuss IV iron if anemia and iron stores do not improve.   Potential side effects reviewed.   Preauth Venofer.   3.   RTC in 1 month for MD assessment, labs (CBC with diff, BMP, ferritin - day before), and +/- Venofer   Addendum:   The patient was contacted on 01/17/202 regarding her low B12.  Oral B12 1000 mcg a day was recommended with follow-up B12 level.  Patient contacted on 05/01/2018 regarding missed appointment.  She refused to come back for an appointment.  She stated nothing was wrong with her.   Quentin MullingBryan Gray, NP  03/28/2018, 9:55 AM   I saw and evaluated the patient, participating in the key portions of the service and reviewing pertinent diagnostic studies and records.  I reviewed the nurse practitioner's note and agree with the findings and the plan.  The assessment and plan were discussed with the patient.  Multiple questions were asked by the patient and answered.   Nelva NayMelissa , MD 03/28/2018,9:55 AM

## 2018-03-30 ENCOUNTER — Telehealth: Payer: Self-pay

## 2018-03-30 NOTE — Telephone Encounter (Signed)
-----   Message from Verlee MonteBryan E Gray, NP sent at 03/29/2018 11:55 AM EST ----- Have start on oral B12 1,000 mcg daily. Will have her come back in 1 month to have B12 level rechecked. If not improving,  will need to do injections.   Judie GrieveBryan ----- Message ----- From: Leory PlowmanInterface, [facility] In [place] Sent: 03/28/2018   4:12 PM EST To: Verlee MonteBryan E Gray, NP

## 2018-03-30 NOTE — Telephone Encounter (Signed)
Contacted patient and informed her of B12 levels and recommended Vit B12 1000 MCG Daily. Patient states understanding but requested that I contact her son, Loraine Leriche, to leave a detailed message regarding specific medication. Contacted son and left VM regarding medication and details. Advised to call back with any questions.

## 2018-04-16 ENCOUNTER — Ambulatory Visit (INDEPENDENT_AMBULATORY_CARE_PROVIDER_SITE_OTHER): Payer: Medicare Other

## 2018-04-16 VITALS — BP 132/78 | HR 79 | Resp 16 | Ht 62.0 in | Wt 170.4 lb

## 2018-04-16 DIAGNOSIS — Z0000 Encounter for general adult medical examination without abnormal findings: Secondary | ICD-10-CM | POA: Diagnosis not present

## 2018-04-16 NOTE — Progress Notes (Signed)
Subjective:   Judy MorgansMildred J Harvey is a 85 y.o. female who presents for Medicare Annual (Subsequent) preventive examination.  Review of Systems:   Cardiac Risk Factors include: hypertension;diabetes mellitus;dyslipidemia;advanced age (>5955men, 33>65 women);obesity (BMI >30kg/m2);smoking/ tobacco exposure(quit smoking 2008)     Objective:     Vitals: BP 132/78 (BP Location: Right Arm, Patient Position: Sitting, Cuff Size: Normal)   Pulse 79   Resp 16   Ht 5\' 2"  (1.575 m)   Wt 170 lb 6.4 oz (77.3 kg)   SpO2 95%   BMI 31.17 kg/m   Body mass index is 31.17 kg/m.  Advanced Directives 04/16/2018 02/15/2018 10/18/2017 10/17/2017 03/29/2017 03/24/2016 03/05/2016  Does Patient Have a Medical Advance Directive? Yes Yes Yes Yes Yes - No;Yes  Type of Advance Directive Healthcare Power of WentworthAttorney;Living will - Living will Living will Healthcare Power of [place]Attorney;Living will Healthcare Power of [place]ttorney Healthcare Power of [place]orney;Living will  Does patient want to make changes to medical advance directive? - - No - Patient declined No - Patient declined - - No - Patient declined  Copy of Healthcare Power of Attorney in Chart? No - copy requested - - - No - copy requested - No - copy requested  Would patient like information on creating a medical advance directive? - - - - - - No - Patient declined    Tobacco Social History   Tobacco Use  Smoking Status Former Smoker  . Packs/day: 3.00  . Years: 50.00  . Pack years: 150.00  . Types: Cigarettes  . Last attempt to quit: 2008  . Years since quitting: 12.0  Smokeless Tobacco Never Used  Tobacco Comment   smoking cessation materials not required     Counseling given: Not Answered Comment: smoking cessation materials not required   Clinical Intake:  Pre-visit preparation completed: Yes  Pain : No/denies pain     Nutritional Status: BMI > 30  Obese Nutritional Risks: None Diabetes: Yes CBG done?: No Did pt. bring in CBG monitor from  home?: No   Nutrition Risk Assessment:  Has the patient had any N/V/D within the last 2 months?  No  Does the patient have any non-healing wounds?  No  Has the patient had any unintentional weight loss or weight gain?  No   Diabetes:  Is the patient diabetic?  Yes  If diabetic, was a CBG obtained today?  No  Did the patient bring in their glucometer from home?  No  How often do you monitor your CBG's? Pt does not actively check her blood sugar due to being unable to see meter .   Financial Strains and Diabetes Management:  Are you having any financial strains with the device, your supplies or your medication? No .  Does the patient want to be seen by Chronic Care Management for management of their diabetes?  No  Would the patient like to be referred to a Nutritionist or for Diabetic Management?  No   Diabetic Exams:  Diabetic Eye Exam: pt no longer goes to the eye doctor  Diabetic Foot Exam: Completed 12/18/17.   How often do you need to have someone help you when you read instructions, pamphlets, or other written materials from your doctor or pharmacy?: 1 - Never What is the last grade level you completed in school?: some college  Interpreter Needed?: No  Information entered by :: Reather LittlerKasey Eddy Termine LPN  Past Medical History:  Diagnosis Date  . Anemia   . Depression   .  Diabetes mellitus without complication (HCC)   . GERD (gastroesophageal reflux disease)   . Hyperlipidemia   . Hypertension   . Legally blind   . Macular degeneration, bilateral   . Peripheral neuropathy   . Post herpetic neuralgia    located in her back  . Stroke [facility])    Past Surgical History:  Procedure Laterality Date  . ABDOMINAL HYSTERECTOMY     partial  . APPENDECTOMY    . BREAST SURGERY     reduction  . CAROTID PTA/STENT INTERVENTION Left 10/18/2017   Procedure: CAROTID PTA/STENT INTERVENTION;  Surgeon: Annice Needy, MD;  Location: [facility] INVASIVE CV LAB;  Service: Cardiovascular;  Laterality:  Left;  . CATARACT EXTRACTION, BILATERAL    . CESAREAN SECTION     x3  . COLONOSCOPY    . ESOPHAGEAL DILATION     Family History  Problem Relation Age of Onset  . Healthy Mother   . Diabetes Father   . Alzheimer's disease Father   . Multiple sclerosis Sister   . Multiple sclerosis Brother    Social History   Socioeconomic History  . Marital status: Widowed    Spouse name: Not on file  . Number of children: 3  . Years of education: some college  . Highest education level: Not on file  Occupational History  . Occupation: Disabled  Social Needs  . Financial resource strain: Not hard at all  . Food insecurity:    Worry: Never true    Inability: Never true  . Transportation needs:    Medical: Yes    Non-medical: Yes  Tobacco Use  . Smoking status: Former Smoker    Packs/day: 3.00    Years: 50.00    Pack years: 150.00    Types: Cigarettes    Last attempt to quit: 2008    Years since quitting: 12.0  . Smokeless tobacco: Never Used  . Tobacco comment: smoking cessation materials not required  Substance and Sexual Activity  . Alcohol use: No  . Drug use: No  . Sexual activity: Not Currently  Lifestyle  . Physical activity:    Days per week: 0 days    Minutes per session: 0 min  . Stress: Not at all  Relationships  . Social connections:    Talks on phone: More than three times a week    Gets together: Three times a week    Attends religious service: Never    Active member of club or organization: No    Attends meetings of clubs or organizations: Never    Relationship status: Widowed  Other Topics Concern  . Not on file  Social History Narrative   Pt lives with son Loraine Harvey    Outpatient Encounter Medications as of 04/16/2018  Medication Sig  . Acetaminophen (TYLENOL) 325 MG CAPS Take 2 tablets by mouth every 6 (six) hours as needed.   Marland Kitchen aspirin EC 81 MG tablet Take 81 mg by mouth daily.  Marland Kitchen atorvastatin (LIPITOR) 80 MG tablet Take 1 tablet (80 mg total) by mouth at  bedtime.  . Cholecalciferol (VITAMIN D3) 5000 units CAPS Take 1 capsule (5,000 Units total) by mouth daily.  . clopidogrel (PLAVIX) 75 MG tablet Take 1 tablet (75 mg total) by mouth daily.  Marland Kitchen docusate sodium (COLACE) 100 MG capsule Take 100 mg by mouth daily.  . DULoxetine (CYMBALTA) 30 MG capsule Take 1 capsule (30 mg total) by mouth daily.  . Ferrous Sulfate (IRON) 325 (65 Fe) MG TABS Take 1  tablet (325 mg total) by mouth 2 (two) times daily.  Marland Kitchen. gabapentin (NEURONTIN) 300 MG capsule Take by mouth.  . insulin glargine (LANTUS) 100 UNIT/ML injection INJECT 50 UNITS INTO THE SKIN DAILY AT 10 PM. (Patient taking differently: INJECT 50 UNITS INTO THE SKIN DAILY AT 10 PM.)  . Insulin Syringe-Needle U-100 (B-D INS SYR ULTRAFINE .3CC/31G) 31G X 5/16" 0.3 ML MISC Use to administer insulin daily to control diabetes. 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  Marland Kitchen. lisinopril (PRINIVIL,ZESTRIL) 10 MG tablet Take 1 tablet (10 mg total) by mouth daily.  . metFORMIN (GLUCOPHAGE-XR) 500 MG 24 hr tablet Take 2 tablets (1,000 mg total) by mouth daily with breakfast.  . pantoprazole (PROTONIX) 40 MG tablet Take by mouth.  . triamcinolone cream (KENALOG) 0.1 % APPLY TOPICALLY TWICE A WEEK  . Black Cohosh 540 MG CAPS Take 1 capsule by mouth daily.  . [DISCONTINUED] cephALEXin (KEFLEX) 500 MG capsule Take 1 capsule (500 mg total) by mouth 3 (three) times daily. (Patient not taking: Reported on 03/28/2018)  . [DISCONTINUED] mupirocin ointment (BACTROBAN) 2 % Apply 1 application topically 2 (two) times daily. (Patient not taking: Reported on 02/21/2018)  . [DISCONTINUED] nystatin cream (MYCOSTATIN) Apply 1 application topically 2 (two) times daily. (Patient not taking: Reported on 02/21/2018)  . [DISCONTINUED] senna-docusate (SENOKOT S) 8.6-50 MG tablet Take 1 tablet by mouth daily as needed for mild constipation.   No facility-administered encounter medications on file as of 04/16/2018.      Activities of Daily Living In your present state of health, do you have any difficulty performing the following activities: 04/16/2018 10/18/2017  Hearing? Y Y  Comment declines hearing aids -  Vision? Malvin JohnsY Y  Comment legally blind -  Difficulty concentrating or making decisions? N N  Walking or climbing stairs? Y Y  Comment does not climb stairs or walk on uneven surfaces "legs fine, hard to see when stuff in my way"  Dressing or bathing? Y N  Comment bathes independently but needs assitance with picking out clothing -  Doing errands, shopping? Y Y  Comment - cannot drive due to eyesight  Preparing Food and eating ? Y -  Comment cannot see buttons on stove -  Using the Toilet? N -  In the past six months, have you accidently leaked urine? N -  Do you have problems with loss of bowel control? N -  Managing your Medications? N -  Managing your Finances? Y -  Housekeeping or managing your Housekeeping? Y -  Some recent data might be hidden    Patient Care Team: Duanne LimerickJones, Deanna C, MD as PCP - General (Family Medicine)    Assessment:   This is a routine wellness examination for [place].  Exercise Activities and Dietary recommendations Current Exercise Habits: The patient does not participate in regular exercise at present, Exercise limited by: neurologic condition(s);orthopedic condition(s);Other - see comments(legally blind)  Goals    . DIET - INCREASE WATER INTAKE     Recommend to drink at least 6-8 8oz glasses of water per day.        Fall Risk Fall Risk  04/16/2018 06/16/2017 03/29/2017 09/26/2016 03/24/2016  Falls in the past year? 1 No No No No  Number falls in past yr: 1 - - - -  Injury with Fall? 0 - - - -  Risk for fall due to : Other (Comment) - Impaired vision;Other (Comment) - -  Risk for fall due to: Comment fall when very  anemic - legally blind; use of cane - -  Follow up Falls prevention discussed - - - -   FALL RISK PREVENTION PERTAINING TO THE HOME:  Any stairs  in or around the home WITH handrails? No  Home free of loose throw rugs in walkways, pet beds, electrical cords, etc? Yes  Adequate lighting in your home to reduce risk of falls? Yes   ASSISTIVE DEVICES UTILIZED TO PREVENT FALLS:  Life alert? No  Use of a cane, walker or w/c? Yes  Grab bars in the bathroom? Yes  Shower chair or bench in shower? Yes  Elevated toilet seat or a handicapped toilet? NO   DME ORDERS:  DME order needed?  No   TIMED UP AND GO:  Was the test performed? Yes .  Length of time to ambulate 10 feet: 8 sec.   GAIT:  Appearance of gait: Gait stead-fast and with the use of an assistive device.   Education: Fall risk prevention has been discussed.  Intervention(s) required? No   Depression Screen PHQ 2/9 Scores 04/16/2018 06/16/2017 03/29/2017 12/28/2016  PHQ - 2 Score 2 0 0 0  PHQ- 9 Score 9 - - 0     Cognitive Function     6CIT Screen 04/16/2018 03/29/2017  What Year? 0 points 0 points  What month? 0 points 0 points  What time? 0 points 0 points  Count back from 20 0 points 0 points  Months in reverse 0 points 2 points  Repeat phrase 0 points 2 points  Total Score 0 4    Immunization History  Administered Date(s) Administered  . Influenza, High Dose Seasonal PF 12/28/2016, 12/18/2017  . Pneumococcal Conjugate-13 03/24/2016  . Pneumococcal Polysaccharide-23 03/29/2017  . Tdap 03/24/2016    Qualifies for Shingles Vaccine? Yes  . Due for Shingrix. Education has been provided regarding the importance of this vaccine. Pt has been advised to call insurance company to determine out of pocket expense. Advised may also receive vaccine at local pharmacy or Health Dept. Verbalized acceptance and understanding.  Tdap: Up to date  Flu Vaccine: Up to date  Pneumococcal Vaccine: Up to date   Screening Tests Health Maintenance  Topic Date Due  . OPHTHALMOLOGY EXAM  03/25/1937  . HEMOGLOBIN A1C  08/21/2018  . FOOT EXAM  12/19/2018  . TETANUS/TDAP   03/24/2026  . INFLUENZA VACCINE  Completed  . PNA vac Low Risk Adult  Completed  . DEXA SCAN  Discontinued   Cancer Screenings:  Colorectal Screening: No longer required.   Mammogram:  No longer required. Alton Revere Density:  Per patient completed at [facility] but does not recall year, location or results.   Lung Cancer Screening: (Low Dose CT Chest recommended if Age 70-80 years, 30 pack-year currently smoking OR have quit w/in 15years.) does qualify. Declines  Additional Screening:  Hepatitis C Screening: no longer required  Vision Screening: Recommended annual ophthalmology exams for early detection of glaucoma and other disorders of the eye. Is the patient up to date with their annual eye exam?  No - pt is legally blind  Dental Screening: Recommended annual dental exams for proper oral hygiene  Community Resource Referral:  CRR required this visit?  No      Plan:     I have personally reviewed and addressed the Medicare Annual Wellness questionnaire and have noted the following in the patient's chart:  A. Medical and social history B. Use of alcohol, tobacco or illicit drugs  C.  Current medications and supplements D. Functional ability and status E.  Nutritional status F.  Physical activity G. Advance directives H. List of other physicians I.  Hospitalizations, surgeries, and ER visits in previous 12 months J.  Vitals K. Screenings such as hearing and vision if needed, cognitive and depression L. Referrals and appointments   In addition, I have reviewed and discussed with patient certain preventive protocols, quality metrics, and best practice recommendations. A written personalized care plan for preventive services as well as general preventive health recommendations were provided to patient.   Signed,  Reather Littler, LPN Nurse Health Advisor   Nurse Notes: Pt accompanied to visit today by her son Loraine Harvey. Overall she is doing well and trying to maintain her  independence as long as she can due to macular degeneration.

## 2018-04-16 NOTE — Patient Instructions (Signed)
[name] , Thank you for taking time to come for your Medicare Wellness Visit. I appreciate your ongoing commitment to your health goals. Please review the following plan we discussed and let me know if I can assist you in the future.   Screening recommendations/referrals: Colonoscopy: no longer required Mammogram: no longer required Bone Density: postponed Recommended yearly ophthalmology/optometry visit for glaucoma screening and checkup Recommended yearly dental visit for hygiene and checkup  Vaccinations: Influenza vaccine: done 12/18/17 Pneumococcal vaccine: done 03/29/17 Tdap vaccine: done 03/24/16 Shingles vaccine: Shingrix discussed. Please contact your pharmacy for coverage information.     Advanced directives: Please bring a copy of your health care power of attorney and living will to the office at your convenience.  Conditions/risks identified: Recommend drinking 6-8 glasses of water per day.   Next appointment: 06/22/18 10:00 Dr. Yetta Barre   Preventive Care 65 Years and Older, Female Preventive care refers to lifestyle choices and visits with your health care provider that can promote health and wellness. What does preventive care include?  A yearly physical exam. This is also called an annual well check.  Dental exams once or twice a year.  Routine eye exams. Ask your health care provider how often you should have your eyes checked.  Personal lifestyle choices, including:  Daily care of your teeth and gums.  Regular physical activity.  Eating a healthy diet.  Avoiding tobacco and drug use.  Limiting alcohol use.  Practicing safe sex.  Taking low-dose aspirin every day.  Taking vitamin and mineral supplements as recommended by your health care provider. What happens during an annual well check? The services and screenings done by your health care provider during your annual well check will depend on your age, overall health, lifestyle risk factors, and  family history of disease. Counseling  Your health care provider may ask you questions about your:  Alcohol use.  Tobacco use.  Drug use.  Emotional well-being.  Home and relationship well-being.  Sexual activity.  Eating habits.  History of falls.  Memory and ability to understand (cognition).  Work and work Astronomer.  Reproductive health. Screening  You may have the following tests or measurements:  Height, weight, and BMI.  Blood pressure.  Lipid and cholesterol levels. These may be checked every 5 years, or more frequently if you are over 16 years old.  Skin check.  Lung cancer screening. You may have this screening every year starting at age 76 if you have a 30-pack-year history of smoking and currently smoke or have quit within the past 15 years.  Fecal occult blood test (FOBT) of the stool. You may have this test every year starting at age 23.  Flexible sigmoidoscopy or colonoscopy. You may have a sigmoidoscopy every 5 years or a colonoscopy every 10 years starting at age 86.  Hepatitis C blood test.  Hepatitis B blood test.  Sexually transmitted disease (STD) testing.  Diabetes screening. This is done by checking your blood sugar (glucose) after you have not eaten for a while (fasting). You may have this done every 1-3 years.  Bone density scan. This is done to screen for osteoporosis. You may have this done starting at age 21.  Mammogram. This may be done every 1-2 years. Talk to your health care provider about how often you should have regular mammograms. Talk with your health care provider about your test results, treatment options, and if necessary, the need for more tests. Vaccines  Your health care provider may recommend certain  vaccines, such as:  Influenza vaccine. This is recommended every year.  Tetanus, diphtheria, and acellular pertussis (Tdap, Td) vaccine. You [place] need a Td booster every 10 years.  Zoster vaccine. You Shaub need this  after age 51.  Pneumococcal 13-valent conjugate (PCV13) vaccine. One dose is recommended after age 47.  Pneumococcal polysaccharide (PPSV23) vaccine. One dose is recommended after age 35. Talk to your health care provider about which screenings and vaccines you need and how often you need them. This information is not intended to replace advice given to you by your health care provider. Make sure you discuss any questions you have with your health care provider. Document Released: 03/27/2015 Document Revised: 11/18/2015 Document Reviewed: 12/30/2014 Elsevier Interactive Patient Education  2017 [place] Prevention in the Home Falls can cause injuries. They can happen to people of all ages. There are many things you can do to make your home safe and to help prevent falls. What can I do on the outside of my home?  Regularly fix the edges of walkways and driveways and fix any cracks.  Remove anything that might make you trip as you walk through a door, such as a raised step or threshold.  Trim any bushes or trees on the path to your home.  Use bright outdoor lighting.  Clear any walking paths of anything that might make someone trip, such as rocks or tools.  Regularly check to see if handrails are loose or broken. Make sure that both sides of any steps have handrails.  Any raised decks and porches should have guardrails on the edges.  Have any leaves, snow, or ice cleared regularly.  Use sand or salt on walking paths during winter.  Clean up any spills in your garage right away. This includes oil or grease spills. What can I do in the bathroom?  Use night lights.  Install grab bars by the toilet and in the tub and shower. Do not use towel bars as grab bars.  Use non-skid mats or decals in the tub or shower.  If you need to sit down in the shower, use a plastic, non-slip stool.  Keep the floor dry. Clean up any water that spills on the floor as soon as it  happens.  Remove soap buildup in the tub or shower regularly.  Attach bath mats securely with double-sided non-slip rug tape.  Do not have throw rugs and other things on the floor that can make you trip. What can I do in the bedroom?  Use night lights.  Make sure that you have a light by your bed that is easy to reach.  Do not use any sheets or blankets that are too big for your bed. They should not hang down onto the floor.  Have a firm chair that has side arms. You can use this for support while you get dressed.  Do not have throw rugs and other things on the floor that can make you trip. What can I do in the kitchen?  Clean up any spills right away.  Avoid walking on wet floors.  Keep items that you use a lot in easy-to-reach places.  If you need to reach something above you, use a strong step stool that has a grab bar.  Keep electrical cords out of the way.  Do not use floor polish or wax that makes floors slippery. If you must use wax, use non-skid floor wax.  Do not have throw rugs and other things  on the floor that can make you trip. What can I do with my stairs?  Do not leave any items on the stairs.  Make sure that there are handrails on both sides of the stairs and use them. Fix handrails that are broken or loose. Make sure that handrails are as long as the stairways.  Check any carpeting to make sure that it is firmly attached to the stairs. Fix any carpet that is loose or worn.  Avoid having throw rugs at the top or bottom of the stairs. If you do have throw rugs, attach them to the floor with carpet tape.  Make sure that you have a light switch at the top of the stairs and the bottom of the stairs. If you do not have them, ask someone to add them for you. What else can I do to help prevent falls?  Wear shoes that:  Do not have high heels.  Have rubber bottoms.  Are comfortable and fit you well.  Are closed at the toe. Do not wear sandals.  If you  use a stepladder:  Make sure that it is fully opened. Do not climb a closed stepladder.  Make sure that both sides of the stepladder are locked into place.  Ask someone to hold it for you, if possible.  Clearly mark and make sure that you can see:  Any grab bars or handrails.  First and last steps.  Where the edge of each step is.  Use tools that help you move around (mobility aids) if they are needed. These include:  Canes.  Walkers.  Scooters.  Crutches.  Turn on the lights when you go into a dark area. Replace any light bulbs as soon as they burn out.  Set up your furniture so you have a clear path. Avoid moving your furniture around.  If any of your floors are uneven, fix them.  If there are any pets around you, be aware of where they are.  Review your medicines with your doctor. Some medicines can make you feel dizzy. This can increase your chance of falling. Ask your doctor what other things that you can do to help prevent falls. This information is not intended to replace advice given to you by your health care provider. Make sure you discuss any questions you have with your health care provider. Document Released: 12/25/2008 Document Revised: 08/06/2015 Document Reviewed: 04/04/2014 Elsevier Interactive Patient Education  2017 [employer].

## 2018-04-26 ENCOUNTER — Other Ambulatory Visit: Payer: Medicare Other

## 2018-04-26 ENCOUNTER — Ambulatory Visit: Payer: Medicare Other | Admitting: Hematology and Oncology

## 2018-04-26 ENCOUNTER — Other Ambulatory Visit: Payer: Self-pay | Admitting: Hematology and Oncology

## 2018-04-26 DIAGNOSIS — E538 Deficiency of other specified B group vitamins: Secondary | ICD-10-CM

## 2018-04-26 DIAGNOSIS — D649 Anemia, unspecified: Secondary | ICD-10-CM

## 2018-04-26 NOTE — Progress Notes (Deleted)
[facility]-  Cancer Center  Clinic day:  04/26/2018  Chief Complaint: Judy Harvey is a 85 y.o. female with iron deficiency who is referred in consultation by Dr. Elizabeth Sauer for assessment and management.  HPI:  The patient was seen in the hematology clinic on 03/28/2018 for initial consultation.  She has iron deficiency anemia.  We discussed continuation of oral iron and consideration of IV iron if she did not improve.  Labs on 03/28/2018 revealed an iron saturation of 34% and a TIBC of 378.  Folate was 13.8.  B12 was 272 (low normal).  TSH was 2.323.  She was contacted regarding initiation of oral B12.  During the interim,    Past Medical History:  Diagnosis Date  . Anemia   . Depression   . Diabetes mellitus without complication (HCC)   . GERD (gastroesophageal reflux disease)   . Hyperlipidemia   . Hypertension   . Legally blind   . Macular degeneration, bilateral   . Peripheral neuropathy   . Post herpetic neuralgia    located in her back  . Stroke [facility])     Past Surgical History:  Procedure Laterality Date  . ABDOMINAL HYSTERECTOMY     partial  . APPENDECTOMY    . BREAST SURGERY     reduction  . CAROTID PTA/STENT INTERVENTION Left 10/18/2017   Procedure: CAROTID PTA/STENT INTERVENTION;  Surgeon: Annice Needy, MD;  Location: [facility] INVASIVE CV LAB;  Service: Cardiovascular;  Laterality: Left;  . CATARACT EXTRACTION, BILATERAL    . CESAREAN SECTION     x3  . COLONOSCOPY    . ESOPHAGEAL DILATION      Family History  Problem Relation Age of Onset  . Healthy Mother   . Diabetes Father   . Alzheimer's disease Father   . Multiple sclerosis Sister   . Multiple sclerosis Brother     Social History:  reports that she quit smoking about 12 years ago. Her smoking use included cigarettes. She has a 150.00 pack-year smoking history. She has never used smokeless tobacco. She reports that she does not drink alcohol or use drugs.  Goes by Judy Harvey.  Patient is retired from [employer], corporate work. The patient is accompanied by her son, Judy Harvey,  today.  Allergies:  Allergies  Allergen Reactions  . Adhesive [Tape] Other (See Comments)    "pulls my skin off" paper tape ok  . Lyrica [Pregabalin]     Body spasms    Current Medications: Current Outpatient Medications  Medication Sig Dispense Refill  . Acetaminophen (TYLENOL) 325 MG CAPS Take 2 tablets by mouth every 6 (six) hours as needed.     Marland Kitchen aspirin EC 81 MG tablet Take 81 mg by mouth daily.    Marland Kitchen atorvastatin (LIPITOR) 80 MG tablet Take 1 tablet (80 mg total) by mouth at bedtime. 90 tablet 1  . Black Cohosh 540 MG CAPS Take 1 capsule by mouth daily.    . Cholecalciferol (VITAMIN D3) 5000 units CAPS Take 1 capsule (5,000 Units total) by mouth daily. 30 capsule   . clopidogrel (PLAVIX) 75 MG tablet Take 1 tablet (75 mg total) by mouth daily. 90 tablet 1  . docusate sodium (COLACE) 100 MG capsule Take 100 mg by mouth daily.    . DULoxetine (CYMBALTA) 30 MG capsule Take 1 capsule (30 mg total) by mouth daily. 90 capsule 1  . Ferrous Sulfate (IRON) 325 (65 Fe) MG TABS Take 1 tablet (325 mg total) by mouth 2 (two)  times daily. 30 each 0  . gabapentin (NEURONTIN) 300 MG capsule Take by mouth.    . insulin glargine (LANTUS) 100 UNIT/ML injection INJECT 50 UNITS INTO THE SKIN DAILY AT 10 PM. (Patient taking differently: INJECT 50 UNITS INTO THE SKIN DAILY AT 10 PM.) 20 mL 5  . Insulin Syringe-Needle U-100 (B-D INS SYR ULTRAFINE .3CC/31G) 31G X 5/16" 0.3 ML MISC Use to administer insulin daily to control diabetes. 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 100 each 3  . lisinopril (PRINIVIL,ZESTRIL) 10 MG tablet Take 1 tablet (10 mg total) by mouth daily. 90 tablet 1  . metFORMIN (GLUCOPHAGE-XR) 500 MG 24 hr tablet Take 2 tablets (1,000 mg total) by mouth daily with breakfast. 180 tablet 5  . pantoprazole (PROTONIX) 40 MG tablet Take by mouth.    . triamcinolone cream  (KENALOG) 0.1 % APPLY TOPICALLY TWICE A WEEK  1   No current facility-administered medications for this visit.     Review of Systems:  GENERAL:  Feels good.  Active.  No fevers, sweats or weight loss. PERFORMANCE STATUS (ECOG):  *** HEENT:  No visual changes, runny nose, sore throat, mouth sores or tenderness. Lungs: No shortness of breath or cough.  No hemoptysis. Cardiac:  No chest pain, palpitations, orthopnea, or PND. GI:  No nausea, vomiting, diarrhea, constipation, melena or hematochezia. GU:  No urgency, frequency, dysuria, or hematuria. Musculoskeletal:  No back pain.  No joint pain.  No muscle tenderness. Extremities:  No pain or swelling. Skin:  No rashes or skin changes. Neuro:  No headache, numbness or weakness, balance or coordination issues. Endocrine:  No diabetes, thyroid issues, hot flashes or night sweats. Psych:  No mood changes, depression or anxiety. Pain:  No focal pain. Review of systems:  All other systems reviewed and found to be negative.  Physical Exam: There were no vitals taken for this visit. GENERAL:  Well developed, well nourished, **man sitting comfortably in the exam room in no acute distress. MENTAL STATUS:  Alert and oriented to person, place and time. HEAD:  *** hair.  Normocephalic, atraumatic, face symmetric, no Cushingoid features. EYES:  *** eyes.  Pupils equal round and reactive to light and accomodation.  No conjunctivitis or scleral icterus. ENT:  Oropharynx clear without lesion.  Tongue normal. Mucous membranes moist.  RESPIRATORY:  Clear to auscultation without rales, wheezes or rhonchi. CARDIOVASCULAR:  Regular rate and rhythm without murmur, rub or gallop. ABDOMEN:  Soft, non-tender, with active bowel sounds, and no hepatosplenomegaly.  No masses. SKIN:  No rashes, ulcers or lesions. EXTREMITIES: No edema, no skin discoloration or tenderness.  No palpable cords. LYMPH NODES: No palpable cervical, supraclavicular, axillary or inguinal  adenopathy  NEUROLOGICAL: Unremarkable. PSYCH:  Appropriate.   No visits with results within 3 Day(s) from this visit.  Latest known visit with results is:  Office Visit on 03/28/2018  Component Date Value Ref Range Status  . TSH 03/28/2018 2.323  0.350 - 4.500 uIU/mL Final   Comment: Performed by a 3rd Generation assay with a functional sensitivity of <=0.01 uIU/mL. Performed at [facility], [address], [place], [place] [address]   . Iron 03/28/2018 129  28 - 170 ug/dL Final  . TIBC 38/38/1840 378  250 - 450 ug/dL Final  . Saturation Ratios 03/28/2018 34* 10.4 - 31.8 % Final  . UIBC 03/28/2018 249  ug/dL Final   Performed at [facility], [address] [place], [place] [address]  . Folate 03/28/2018  13.8  >5.9 ng/mL Final   Performed at [facility], [address] [place], [place] [address]  . Vitamin B-12 03/28/2018 272  180 - 914 pg/mL Final   Comment: (NOTE) This assay is not validated for testing neonatal or myeloproliferative syndrome specimens for Vitamin B12 levels. Performed at [facility], [address] [place], [place] [address]     Assessment:  Judy MorgansMildred J Harvey is a 85 y.o. female with iron deficiency anemia.  Plan: 1.  Labs today: CBC with diff, BMP, ferritin, B12. 2.  Iron deficiency anemia: 3.   4.    Judy BathMelissa C Corcoran, MD  04/26/2018, 6:18 AM    I saw and evaluated the patient, participating in the key portions of the service and reviewing pertinent diagnostic studies and records.  I reviewed the nurse practitioner's note and agree with the findings and the plan.  The assessment and plan were discussed with the patient.  Additional diagnostic studies of *** are needed to clarify *** and would change the clinical management.  A few ***multiple questions were asked by the patient and answered.   Nelva NayMelissa Corcoran, MD 04/26/2018,6:18 AM

## 2018-04-30 ENCOUNTER — Ambulatory Visit: Payer: Medicare Other | Admitting: Family Medicine

## 2018-05-01 ENCOUNTER — Telehealth: Payer: Self-pay | Admitting: Hematology and Oncology

## 2018-05-01 NOTE — Telephone Encounter (Signed)
I called patient because of a NoShow. She refused to come back for an appt. She stated nothing is wrong with her.

## 2018-05-29 ENCOUNTER — Other Ambulatory Visit: Payer: Self-pay | Admitting: Family Medicine

## 2018-05-29 DIAGNOSIS — F324 Major depressive disorder, single episode, in partial remission: Secondary | ICD-10-CM

## 2018-06-22 ENCOUNTER — Other Ambulatory Visit: Payer: Self-pay | Admitting: Family Medicine

## 2018-06-22 ENCOUNTER — Ambulatory Visit: Payer: Medicare Other | Admitting: Family Medicine

## 2018-06-22 DIAGNOSIS — F324 Major depressive disorder, single episode, in partial remission: Secondary | ICD-10-CM

## 2018-07-16 ENCOUNTER — Telehealth: Payer: Self-pay

## 2018-07-16 NOTE — Telephone Encounter (Signed)
Called asking what creams were for. Nystatin- needs to use under the breast, Mupirocin- cuts or scrapes, Triamcinolone- rash/ spoke to pt and caregiver

## 2018-08-09 ENCOUNTER — Other Ambulatory Visit: Payer: Self-pay | Admitting: Family Medicine

## 2018-08-09 DIAGNOSIS — E785 Hyperlipidemia, unspecified: Secondary | ICD-10-CM

## 2018-08-16 ENCOUNTER — Other Ambulatory Visit: Payer: Self-pay | Admitting: Family Medicine

## 2018-08-16 DIAGNOSIS — I10 Essential (primary) hypertension: Secondary | ICD-10-CM

## 2018-08-21 ENCOUNTER — Encounter: Payer: Self-pay | Admitting: Family Medicine

## 2018-08-21 ENCOUNTER — Ambulatory Visit: Payer: Medicare Other | Admitting: Family Medicine

## 2018-08-21 ENCOUNTER — Other Ambulatory Visit: Payer: Self-pay

## 2018-08-21 VITALS — BP 120/70 | HR 64 | Ht 62.0 in | Wt 168.0 lb

## 2018-08-21 DIAGNOSIS — I10 Essential (primary) hypertension: Secondary | ICD-10-CM

## 2018-08-21 DIAGNOSIS — F324 Major depressive disorder, single episode, in partial remission: Secondary | ICD-10-CM | POA: Insufficient documentation

## 2018-08-21 DIAGNOSIS — E119 Type 2 diabetes mellitus without complications: Secondary | ICD-10-CM | POA: Diagnosis not present

## 2018-08-21 DIAGNOSIS — E785 Hyperlipidemia, unspecified: Secondary | ICD-10-CM

## 2018-08-21 DIAGNOSIS — I679 Cerebrovascular disease, unspecified: Secondary | ICD-10-CM

## 2018-08-21 DIAGNOSIS — R69 Illness, unspecified: Secondary | ICD-10-CM

## 2018-08-21 DIAGNOSIS — G63 Polyneuropathy in diseases classified elsewhere: Secondary | ICD-10-CM

## 2018-08-21 DIAGNOSIS — D509 Iron deficiency anemia, unspecified: Secondary | ICD-10-CM

## 2018-08-21 DIAGNOSIS — Z794 Long term (current) use of insulin: Secondary | ICD-10-CM

## 2018-08-21 MED ORDER — CLOPIDOGREL BISULFATE 75 MG PO TABS: 75.0000 mg | ORAL_TABLET | Freq: Every day | ORAL | 1 refills | Status: DC

## 2018-08-21 MED ORDER — INSULIN GLARGINE 100 UNIT/ML ~~LOC~~ SOLN: SUBCUTANEOUS | 5 refills | Status: DC

## 2018-08-21 MED ORDER — ATORVASTATIN CALCIUM 80 MG PO TABS: ORAL_TABLET | ORAL | 1 refills | Status: DC

## 2018-08-21 MED ORDER — DULOXETINE HCL 30 MG PO CPEP: ORAL_CAPSULE | ORAL | 1 refills | Status: DC

## 2018-08-21 MED ORDER — GABAPENTIN 300 MG PO CAPS: 300.0000 mg | ORAL_CAPSULE | Freq: Two times a day (BID) | ORAL | 1 refills | Status: DC

## 2018-08-21 MED ORDER — LISINOPRIL 10 MG PO TABS: 10.0000 mg | ORAL_TABLET | Freq: Every day | ORAL | 1 refills | Status: DC

## 2018-08-21 MED ORDER — METFORMIN HCL ER 500 MG PO TB24: 1000.0000 mg | ORAL_TABLET | Freq: Every day | ORAL | 5 refills | Status: DC

## 2018-08-21 MED ORDER — "INSULIN SYRINGE-NEEDLE U-100 31G X 5/16"" 0.3 ML MISC": 3 refills | Status: DC

## 2018-08-21 NOTE — Progress Notes (Signed)
Date:  08/21/2018   Name:  Judy MorgansMildred J Harvey   DOB:  August 17, 1927   MRN:  [id number]   Chief Complaint: Diabetes; Anemia; Hyperlipidemia; Hypertension; and Peripheral Neuropathy  Diabetes  She presents for her follow-up diabetic visit. She has type 2 diabetes mellitus. Her disease course has been stable. There are no hypoglycemic associated symptoms. Pertinent negatives for hypoglycemia include no confusion, dizziness, headaches, hunger, nervousness/anxiousness, pallor or sweats. Pertinent negatives for diabetes include no blurred vision, no chest pain, no fatigue, no foot paresthesias, no foot ulcerations, no polydipsia, no polyphagia, no polyuria, no visual change, no weakness and no weight loss. There are no hypoglycemic complications. Symptoms are stable. Diabetic complications include a CVA. Pertinent negatives for diabetic complications include no PVD or retinopathy. Risk factors for coronary artery disease include diabetes mellitus, dyslipidemia and hypertension. Current diabetic treatment includes oral agent (dual therapy), oral agent (monotherapy) and insulin injections. She is compliant with treatment none of the time. Her weight is stable. She is following a generally healthy diet. Meal planning includes avoidance of concentrated sweets and carbohydrate counting. She participates in exercise intermittently. Home blood sugar record trend: not checking. An ACE inhibitor/angiotensin II receptor blocker is being taken. She does not see a podiatrist.Eye exam is current.  Anemia  Presents for follow-up (follow by ) visit. There has been no abdominal pain, anorexia, bruising/bleeding easily, confusion, fever, leg swelling, light-headedness, malaise/fatigue, pallor, palpitations, paresthesias or weight loss. Signs of blood loss that are not present include hematemesis, hematochezia, melena, menorrhagia and vaginal bleeding. There is no history of chronic renal disease, heart failure or hypothyroidism.    Hyperlipidemia  This is a chronic problem. The current episode started more than 1 year ago. The problem is controlled. Recent lipid tests were reviewed and are normal. Exacerbating diseases include obesity. She has no history of chronic renal disease, diabetes, hypothyroidism, liver disease or nephrotic syndrome. Pertinent negatives include no chest pain, focal sensory loss, focal weakness, leg pain, myalgias or shortness of breath. Current antihyperlipidemic treatment includes statins. The current treatment provides no improvement of lipids. There are no compliance problems.  Risk factors for coronary artery disease include diabetes mellitus, hypertension and dyslipidemia.  Hypertension  This is a chronic problem. The current episode started more than 1 year ago. The problem has been waxing and waning since onset. The problem is controlled. Pertinent negatives include no anxiety, blurred vision, chest pain, headaches, malaise/fatigue, neck pain, orthopnea, palpitations, peripheral edema, PND, shortness of breath or sweats. There are no associated agents to hypertension. Risk factors for coronary artery disease include diabetes mellitus, dyslipidemia and obesity. Past treatments include ACE inhibitors. There are no compliance problems.  Hypertensive end-organ damage includes CVA. There is no history of angina, kidney disease, CAD/MI, heart failure, left ventricular hypertrophy, PVD or retinopathy. There is no history of chronic renal disease.  Neurologic Problem  The patient's pertinent negatives include no altered mental status, clumsiness, focal sensory loss, focal weakness, loss of balance, memory loss, near-syncope, slurred speech, syncope, visual change or weakness. Primary symptoms comment: expressive aphasia/neuropathy. This is a chronic problem. The neurological problem developed gradually. There was no focality noted. Pertinent negatives include no abdominal pain, auditory change, back pain, bladder  incontinence, bowel incontinence, chest pain, confusion, diaphoresis, dizziness, fatigue, fever, headaches, light-headedness, nausea, neck pain, palpitations or shortness of breath. (Upper and lower neuropathy) The treatment provided moderate relief. Her past medical history is significant for a CVA. There is no history of liver disease.  Depression       (  Expressive aphasia/neuropathy)  This is a chronic problem.  The current episode started more than 1 year ago.   The problem has been gradually improving since onset.  Associated symptoms include no decreased concentration, no fatigue, no helplessness, no hopelessness, does not have insomnia, not irritable, no decreased interest, no appetite change, no body aches, no myalgias, no headaches, no indigestion, not sad and no suicidal ideas.  Past treatments include SNRIs - Serotonin and norepinephrine reuptake inhibitors.   Pertinent negatives include no hypothyroidism and no anxiety.   Review of Systems  Constitutional: Negative for appetite change, chills, diaphoresis, fatigue, fever, malaise/fatigue and weight loss.  HENT: Negative for drooling, ear discharge, ear pain and sore throat.   Eyes: Negative for blurred vision.  Respiratory: Negative for cough, shortness of breath and wheezing.   Cardiovascular: Negative for chest pain, palpitations, orthopnea, leg swelling, PND and near-syncope.  Gastrointestinal: Negative for abdominal pain, anorexia, blood in stool, bowel incontinence, constipation, diarrhea, hematemesis, hematochezia, melena and nausea.  Endocrine: Negative for polydipsia, polyphagia and polyuria.  Genitourinary: Negative for bladder incontinence, dysuria, frequency, hematuria, menorrhagia, urgency and vaginal bleeding.  Musculoskeletal: Negative for back pain, myalgias and neck pain.  Skin: Negative for pallor and rash.  Allergic/Immunologic: Negative for environmental allergies.  Neurological: Negative for dizziness, focal weakness,  syncope, weakness, light-headedness, headaches, paresthesias and loss of balance.  Hematological: Does not bruise/bleed easily.  Psychiatric/Behavioral: Positive for depression. Negative for confusion, decreased concentration, memory loss and suicidal ideas. The patient is not nervous/anxious and does not have insomnia.     Patient Active Problem List   Diagnosis Date Noted   Low vitamin B12 level 04/26/2018   Iron deficiency anemia 03/28/2018   Frequent PVCs 03/21/2018   PSVT (paroxysmal supraventricular tachycardia) ([place]) 03/21/2018   Normocytic anemia 02/28/2018   Benign essential HTN 02/20/2018   SOBOE (shortness of breath on exertion) 02/20/2018   Carotid stenosis, symptomatic w/o infarct, left 10/18/2017   TIA (transient ischemic attack) 06/16/2017   Bilateral carotid artery stenosis 06/16/2017   Hyperlipidemia 06/16/2017   Obesity (BMI 30.0-34.9) 05/02/2016   Vitamin D deficiency 03/25/2016   Controlled type 2 diabetes mellitus with diabetic neuropathy ([place]) 03/24/2016   Old cerebrovascular accident (CVA) without late effect 03/24/2016   Hypertension 03/24/2016   Legally blind 03/24/2016   Macular degeneration 03/24/2016   Constipation 03/24/2016   History of esophageal stricture 03/24/2016   Depression with anxiety 03/24/2016   Restless leg syndrome 03/24/2016   Postherpetic neuralgia 03/24/2016   Gait abnormality 03/24/2016    Allergies  Allergen Reactions   Adhesive [Tape] Other (See Comments)    "pulls my skin off" paper tape ok   Lyrica [Pregabalin]     Body spasms    Past Surgical History:  Procedure Laterality Date   ABDOMINAL HYSTERECTOMY     partial   APPENDECTOMY     BREAST SURGERY     reduction   CAROTID PTA/STENT INTERVENTION Left 10/18/2017   Procedure: CAROTID PTA/STENT INTERVENTION;  Surgeon: Algernon Huxley, MD;  Location: [place] [facility];  Service: Cardiovascular;  Laterality: Left;   CATARACT EXTRACTION,  BILATERAL     CESAREAN SECTION     x3   COLONOSCOPY     ESOPHAGEAL DILATION      Social History   Tobacco Use   Smoking status: Former Smoker    Packs/day: 3.00    Years: 50.00    Pack years: 150.00    Types: Cigarettes    Last attempt to quit: 2008  Years since quitting: 12.4   Smokeless tobacco: Never Used   Tobacco comment: smoking cessation materials not required  Substance Use Topics   Alcohol use: No   Drug use: No     Medication list has been reviewed and updated.  Current Meds  Medication Sig   Acetaminophen (TYLENOL) 325 MG CAPS Take 2 tablets by mouth every 6 (six) hours as needed.    aspirin EC 81 MG tablet Take 81 mg by mouth daily.   atorvastatin (LIPITOR) 80 MG tablet TAKE 1 TABLET BY MOUTH EVERYDAY AT BEDTIME   Black Cohosh 540 MG CAPS Take 1 capsule by mouth daily.   Cholecalciferol (VITAMIN D3) 5000 units CAPS Take 1 capsule (5,000 Units total) by mouth daily.   clopidogrel (PLAVIX) 75 MG tablet Take 1 tablet (75 mg total) by mouth daily.   docusate sodium (COLACE) 100 MG capsule Take 100 mg by mouth daily.   DULoxetine (CYMBALTA) 30 MG capsule TAKE 1 CAPSULE BY MOUTH EVERY DAY   Ferrous Sulfate (IRON) 325 (65 Fe) MG TABS Take 1 tablet (325 mg total) by mouth 2 (two) times daily.   gabapentin (NEURONTIN) 300 MG capsule Take 300 mg by mouth 2 (two) times daily.    insulin glargine (LANTUS) 100 UNIT/ML injection INJECT 50 UNITS INTO THE SKIN DAILY AT 10 PM. (Patient taking differently: INJECT 50 UNITS INTO THE SKIN DAILY AT 10 PM.)   Insulin Syringe-Needle U-100 (B-D INS SYR ULTRAFINE .3CC/31G) 31G X 5/16" 0.3 ML MISC Use to administer insulin daily to control diabetes. 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   lisinopril (ZESTRIL) 10 MG tablet TAKE 1 TABLET BY MOUTH EVERY DAY   metFORMIN (GLUCOPHAGE-XR) 500 MG 24 hr tablet Take 2 tablets (1,000 mg total) by mouth daily with breakfast.   triamcinolone  cream (KENALOG) 0.1 % APPLY TOPICALLY TWICE A WEEK    PHQ 2/9 Scores 08/21/2018 04/16/2018 06/16/2017 03/29/2017  PHQ - 2 Score 0 2 0 0  PHQ- 9 Score 0 9 - -    BP Readings from Last 3 Encounters:  08/21/18 120/70  04/16/18 132/78  03/28/18 122/78    Physical Exam Vitals signs and nursing note reviewed.  Constitutional:      General: She is not irritable.    Appearance: She is well-developed.  HENT:     Head: Normocephalic.     Right Ear: Ear canal and external ear normal.     Left Ear: Ear canal and external ear normal.     Mouth/Throat:     Lips: Pink.     Mouth: Mucous membranes are moist.     Dentition: Abnormal dentition.   Eyes:     General: Lids are everted, no foreign bodies appreciated. No scleral icterus.       Left eye: No foreign body or hordeolum.     Conjunctiva/sclera: Conjunctivae normal.     Right eye: Right conjunctiva is not injected.     Left eye: Left conjunctiva is not injected.     Pupils: Pupils are equal, round, and reactive to light.  Neck:     Musculoskeletal: Normal range of motion and neck supple.     Thyroid: No thyromegaly.     Vascular: No JVD.     Trachea: No tracheal deviation.  Cardiovascular:     Rate and Rhythm: Normal rate and regular rhythm.     Heart sounds: Normal heart sounds. No murmur. No friction rub. No gallop.   Pulmonary:  Effort: Pulmonary effort is normal. No respiratory distress.     Breath sounds: Normal breath sounds. No wheezing or rales.  Abdominal:     General: Bowel sounds are normal.     Palpations: Abdomen is soft. There is no mass.     Tenderness: There is no abdominal tenderness. There is no guarding or rebound.  Musculoskeletal: Normal range of motion.        General: No tenderness.  Lymphadenopathy:     Cervical: No cervical adenopathy.  Skin:    General: Skin is warm.     Findings: No rash.  Neurological:     Mental Status: She is alert and oriented to person, place, and time.     Cranial Nerves: No  cranial nerve deficit.     Deep Tendon Reflexes: Reflexes normal.  Psychiatric:        Mood and Affect: Mood is not anxious or depressed.     Wt Readings from Last 3 Encounters:  08/21/18 168 lb (76.2 kg)  04/16/18 170 lb 6.4 oz (77.3 kg)  03/28/18 167 lb 6 oz (75.9 kg)    BP 120/70    Pulse 64    Ht  (1.575 m)    Wt 168 lb (76.2 kg)    BMI 30.73 kg/m   Assessment and Plan: 1. Type 2 diabetes mellitus without complication, with long-term current use of insulin (HCC) Chronic.  Controlled.  Continue metformin 500 mg twice a day and Lantus 47 units nightly.  Will check A1c, microalbuminuria, renal function panel, - HgB A1c - Microalbumin, urine - Renal Function Panel - Hepatic Function Panel (6) - metFORMIN (GLUCOPHAGE-XR) 500 MG 24 hr tablet; Take 2 tablets (1,000 mg total) by mouth daily with breakfast.  Dispense: 180 tablet; Refill: 5 - Insulin Syringe-Needle U-100 (B-D INS SYR ULTRAFINE .3CC/31G) 31G X 5/16" 0.3 ML MISC; Use to administer insulin daily to control diabetes. 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  Dispense: 100 each; Refill: 3 - insulin glargine (LANTUS) 100 UNIT/ML injection; INJECT 50 UNITS INTO THE SKIN DAILY AT 10 PM.  Dispense: 20 mL; Refill: 5  2. Essential hypertension Chronic.  Controlled.  Continue lisinopril 10 mg once a day and will check renal function panel to assess GFR. - Renal Function Panel - lisinopril (ZESTRIL) 10 MG tablet; Take 1 tablet (10 mg total) by mouth daily.  Dispense: 90 tablet; Refill: 1  3. Major depressive 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, unspecified whether recurrent (HCC) Chronic.  Controlled.  Will continue Cymbalta 30 mg once a day. - DULoxetine (CYMBALTA) 30 MG capsule; TAKE 1 CAPSULE BY MOUTH EVERY DAY  Dispense: 90 capsule; Refill: 1  4. Hyperlipidemia, unspecified hyperlipidemia type Chronic.  Continue atorvastatin 80 mg once a day.  Will check lipid panel. - atorvastatin (LIPITOR) 80 MG  tablet; TAKE 1 TABLET BY MOUTH EVERYDAY AT BEDTIME  Dispense: 90 tablet; Refill: 1  5. Major depressive 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, unspecified whether recurrent (HCC) As noted above. - DULoxetine (CYMBALTA) 30 MG capsule; TAKE 1 CAPSULE BY MOUTH EVERY DAY  Dispense: 90 capsule; Refill: 1  6. Iron deficiency anemia, unspecified iron deficiency anemia type Patient is followed by [facility].  They are assessing her iron deficiency.  Has been requested that we check a CBC serum iron and serum ferritin. - CBC w/Diff/Platelet - Iron - Ferritin  7. Taking medication for chronic disease Patient is on statins and will have hepatic function panel to wait for any hepatotoxicity. -  Hepatic Function Panel (6)  8. Cerebral vascular disease Patient has a history of cerebrovascular disease which is followed by vascular.  Patient is being assessed for carotid stenosis in the meantime patient will continue Plavix 75 mg once a day - clopidogrel (PLAVIX) 75 MG tablet; Take 1 tablet (75 mg total) by mouth daily.  Dispense: 90 tablet; Refill: 1  9. Polyneuropathy associated with underlying disease (HCC) Patient has a polyneuropathy particularly in the hands and feet.  Patient will continue gabapentin 300 mg twice a day. - gabapentin (NEURONTIN) 300 MG capsule; Take 1 capsule (300 mg total) by mouth 2 (two) times daily.  Dispense: 180 capsule; Refill: 1

## 2018-08-22 LAB — RENAL FUNCTION PANEL
Albumin: 4.2 g/dL (ref 3.6–4.6)
BUN/Creatinine Ratio: 25 (ref 12–28)
BUN: 21 mg/dL (ref 8–27)
CO2: 25 mmol/L (ref 20–29)
Calcium: 9.4 mg/dL (ref 8.7–10.3)
Chloride: 98 mmol/L (ref 96–106)
Creatinine, Ser: 0.84 mg/dL (ref 0.57–1.00)
GFR calc Af Amer: 71 mL/min/{1.73_m2} (ref >59)
GFR calc non Af Amer: 62 mL/min/{1.73_m2} (ref >59)
Glucose: 88 mg/dL (ref 65–99)
Phosphorus: 3.9 mg/dL (ref 3.0–4.3)
Potassium: 4.5 mmol/L (ref 3.5–5.2)
Sodium: 140 mmol/L (ref 134–144)

## 2018-08-22 LAB — IRON: Iron: 66 ug/dL (ref 27–139)

## 2018-08-22 LAB — MICROALBUMIN, URINE: Microalbumin, Urine: 33.2 ug/mL

## 2018-08-22 LAB — CBC WITH DIFFERENTIAL/PLATELET
Basophils Absolute: 0.1 10*3/uL (ref 0.0–0.2)
Basos: 1 %
EOS (ABSOLUTE): 0.4 10*3/uL (ref 0.0–0.4)
Eos: 3 %
Hematocrit: 39.4 % (ref 34.0–46.6)
Hemoglobin: 13 g/dL (ref 11.1–15.9)
Immature Grans (Abs): 0 10*3/uL (ref 0.0–0.1)
Immature Granulocytes: 0 %
Lymphocytes Absolute: 1.6 10*3/uL (ref 0.7–3.1)
Lymphs: 13 %
MCH: 30.3 pg (ref 26.6–33.0)
MCHC: 33 g/dL (ref 31.5–35.7)
MCV: 92 fL (ref 79–97)
Monocytes Absolute: 0.8 10*3/uL (ref 0.1–0.9)
Monocytes: 7 %
Neutrophils Absolute: 9.2 10*3/uL — ABNORMAL HIGH (ref 1.4–7.0)
Neutrophils: 76 %
Platelets: 301 10*3/uL (ref 150–450)
RBC: 4.29 x10E6/uL (ref 3.77–5.28)
RDW: 14.4 % (ref 11.7–15.4)
WBC: 12.1 10*3/uL — ABNORMAL HIGH (ref 3.4–10.8)

## 2018-08-22 LAB — HEPATIC FUNCTION PANEL (6)
ALT: 11 IU/L (ref 0–32)
AST: 18 IU/L (ref 0–40)
Alkaline Phosphatase: 83 IU/L (ref 39–117)
Bilirubin Total: 0.3 mg/dL (ref 0.0–1.2)
Bilirubin, Direct: 0.09 mg/dL (ref 0.00–0.40)

## 2018-08-22 LAB — HEMOGLOBIN A1C
Est. average glucose Bld gHb Est-mCnc: 143 mg/dL
Hgb A1c MFr Bld: 6.6 % — ABNORMAL HIGH (ref 4.8–5.6)

## 2018-08-22 LAB — FERRITIN: Ferritin: 31 ng/mL (ref 15–150)

## 2018-08-24 ENCOUNTER — Encounter (INDEPENDENT_AMBULATORY_CARE_PROVIDER_SITE_OTHER): Payer: Self-pay | Admitting: Nurse Practitioner

## 2018-08-24 ENCOUNTER — Ambulatory Visit (INDEPENDENT_AMBULATORY_CARE_PROVIDER_SITE_OTHER): Payer: Medicare Other

## 2018-08-24 ENCOUNTER — Other Ambulatory Visit: Payer: Self-pay

## 2018-08-24 ENCOUNTER — Ambulatory Visit (INDEPENDENT_AMBULATORY_CARE_PROVIDER_SITE_OTHER): Payer: Medicare Other | Admitting: Nurse Practitioner

## 2018-08-24 VITALS — BP 113/74 | HR 82 | Resp 16 | Ht 62.0 in | Wt 169.0 lb

## 2018-08-24 DIAGNOSIS — I6523 Occlusion and stenosis of bilateral carotid arteries: Secondary | ICD-10-CM

## 2018-08-24 DIAGNOSIS — E785 Hyperlipidemia, unspecified: Secondary | ICD-10-CM | POA: Diagnosis not present

## 2018-08-24 DIAGNOSIS — Z9889 Other specified postprocedural states: Secondary | ICD-10-CM

## 2018-08-24 DIAGNOSIS — Z87891 Personal history of nicotine dependence: Secondary | ICD-10-CM

## 2018-08-24 DIAGNOSIS — Z7984 Long term (current) use of oral hypoglycemic drugs: Secondary | ICD-10-CM

## 2018-08-24 DIAGNOSIS — I6522 Occlusion and stenosis of left carotid artery: Secondary | ICD-10-CM

## 2018-08-24 DIAGNOSIS — I10 Essential (primary) hypertension: Secondary | ICD-10-CM | POA: Diagnosis not present

## 2018-08-24 DIAGNOSIS — Z7902 Long term (current) use of antithrombotics/antiplatelets: Secondary | ICD-10-CM

## 2018-08-24 DIAGNOSIS — Z79899 Other long term (current) drug therapy: Secondary | ICD-10-CM

## 2018-08-24 NOTE — Progress Notes (Signed)
SUBJECTIVE:  Patient ID: Judy MorgansMildred J Harvey, female    DOB: 05-18-1927, 85 y.o.   MRN: [id number] Chief Complaint  Patient presents with  . Follow-up    ultrasound follow up    HPI  Judy MorgansMildred J [name] is a 85 y.o. female The patient is seen for follow up evaluation of carotid stenosis status post left  carotid stent on 10/18/2017.  There were no post operative problems or complications related to the surgery.  The patient denies neck or incisional pain.  The patient denies interval amaurosis fugax. There is no recent history of TIA symptoms or focal motor deficits. There is no prior documented CVA.  The patient denies headache.  The patient is taking enteric-coated aspirin 81 mg daily.  The patient has a history of coronary artery disease, no recent episodes of angina or shortness of breath. The patient denies PAD or claudication symptoms. There is a history of hyperlipidemia which is being treated with a statin.   Noninvasive studies reveal occluded right internal carotid artery with 1 to 39% stenosis of the left internal carotid artery.  The previously placed stent is patent.  Past Medical History:  Diagnosis Date  . Anemia   . Depression   . Diabetes mellitus without complication (HCC)   . GERD (gastroesophageal reflux disease)   . Hyperlipidemia   . Hypertension   . Legally blind   . Macular degeneration, bilateral   . Peripheral neuropathy   . Post herpetic neuralgia    located in her back  . Stroke [name](HCC)     Past Surgical History:  Procedure Laterality Date  . ABDOMINAL HYSTERECTOMY     partial  . APPENDECTOMY    . BREAST SURGERY     reduction  . CAROTID PTA/STENT INTERVENTION Left 10/18/2017   Procedure: CAROTID PTA/STENT INTERVENTION;  Surgeon: Annice Needyew, Jason S, MD;  Location: [facility] INVASIVE CV LAB;  Service: Cardiovascular;  Laterality: Left;  . CATARACT EXTRACTION, BILATERAL    . CESAREAN SECTION     x3  . COLONOSCOPY    . ESOPHAGEAL DILATION      Social  History   Socioeconomic History  . Marital status: Widowed    Spouse name: Not on file  . Number of children: 3  . Years of education: some college  . Highest education level: Not on file  Occupational History  . Occupation: Disabled  Social Needs  . Financial resource strain: Not hard at all  . Food insecurity    Worry: Never true    Inability: Never true  . Transportation needs    Medical: Yes    Non-medical: Yes  Tobacco Use  . Smoking status: Former Smoker    Packs/day: 3.00    Years: 50.00    Pack years: 150.00    Types: Cigarettes    Quit date: 2008    Years since quitting: 12.4  . Smokeless tobacco: Never Used  . Tobacco comment: smoking cessation materials not required  Substance and Sexual Activity  . Alcohol use: No  . Drug use: No  . Sexual activity: Not Currently  Lifestyle  . Physical activity    Days per week: 0 days    Minutes per session: 0 min  . Stress: Not at all  Relationships  . Social connections    Talks on phone: More than three times a week    Gets together: Three times a week    Attends religious service: Never    Active member of club or organization:  No    Attends meetings of clubs or organizations: Never    Relationship status: Widowed  . Intimate partner violence    Fear of current or ex partner: Patient refused    Emotionally abused: Patient refused    Physically abused: Patient refused    Forced sexual activity: Patient refused  Other Topics Concern  . Not on file  Social History Narrative   Pt lives with son [name]    Family History  Problem Relation Age of Onset  . Healthy Mother   . Diabetes Father   . Alzheimer's disease Father   . Multiple sclerosis Sister   . Multiple sclerosis Brother     Allergies  Allergen Reactions  . Adhesive [Tape] Other (See Comments)    "pulls my skin off" paper tape ok  . Lyrica [Pregabalin]     Body spasms     Review of Systems   Review of Systems: Negative Unless Checked  Constitutional: [] Weight loss  [] Fever  [] Chills Cardiac: [] Chest pain   []  Atrial Fibrillation  [] Palpitations   [] Shortness of breath when laying flat   [] Shortness of breath with exertion. [] Shortness of breath at rest Vascular:  [] Pain in legs with walking   [] Pain in legs with standing [] Pain in legs when laying flat   [] Claudication    [] Pain in feet when laying flat    [] History of DVT   [] Phlebitis   [] Swelling in legs   [] Varicose veins   [] Non-healing ulcers Pulmonary:   [] Uses home oxygen   [] Productive cough   [] Hemoptysis   [] Wheeze  [] COPD   [] Asthma Neurologic:  [] Dizziness   [] Seizures  [] Blackouts [x] History of stroke   [x] History of TIA  [] Aphasia   [] Temporary Blindness   [] Weakness or numbness in arm   [] Weakness or numbness in leg Musculoskeletal:   [] Joint swelling   [] Joint pain   [] Low back pain  []  History of Knee Replacement [] Arthritis [] back Surgeries  []  Spinal Stenosis    Hematologic:  [] Easy bruising  [] Easy bleeding   [] Hypercoagulable state   [x] Anemic Gastrointestinal:  [] Diarrhea   [] Vomiting  [] Gastroesophageal reflux/heartburn   [] Difficulty swallowing. [] Abdominal pain Genitourinary:  [] Chronic kidney disease   [] Difficult urination  [] Anuric   [] Blood in urine [] Frequent urination  [] Burning with urination   [] Hematuria Skin:  [] Rashes   [] Ulcers [] Wounds Psychological:  [x] History of anxiety   []  History of major depression  []  Memory Difficulties      OBJECTIVE:   Physical Exam  BP 113/74 (BP Location: Left Arm)   Pulse 82   Resp 16   Ht 5\' 2"  (1.575 m)   Wt 169 lb (76.7 kg)   BMI 30.91 kg/m   Gen: WD/WN, NAD Head: [place]/AT, No temporalis wasting.  Ear/Nose/Throat: Hearing grossly intact, nares w/o erythema or drainage Eyes: PER, EOMI, sclera nonicteric.  Neck: Supple, no masses.  No JVD.  Pulmonary:  Good air movement, no use of accessory muscles.  Cardiac: RRR Vascular:  Vessel Right Left  Radial Palpable Palpable   Gastrointestinal: soft,  non-distended. No guarding/no peritoneal signs.  Musculoskeletal: M/S 5/5 throughout.  No deformity or atrophy.  Neurologic: Pain and light touch intact in extremities.  Symmetrical.  Speech is fluent. Motor exam as listed above. Psychiatric: Judgment intact, Mood & affect appropriate for pt's clinical situation. Dermatologic: No Venous rashes. No Ulcers Noted.  No changes consistent with cellulitis. Lymph : No Cervical lymphadenopathy, no lichenification or skin changes of chronic lymphedema.  ASSESSMENT AND PLAN:  1. Carotid stenosis, symptomatic w/o infarct, left Recommend:  The patient is s/p successful Left carotid stent   Noninvasive studies reveal occluded right internal carotid artery with 1 to 39% stenosis of the left internal carotid artery.  The previously placed stent is patent.  Continue antiplatelet therapy as prescribed Continue management of CAD, HTN and Hyperlipidemia Healthy heart diet,  encouraged exercise at least 4 times per week  Follow up in 12 months with duplex ultrasound and physical exam based on the patient's carotid surgery and occluded R ICA   - VAS US CAROTID; Future  2. Essential hypertension Continue antihypertensive medications as already ordered, these medications have been reviewed and there are no changes at this time.   3. Hyperlipidemia, unspecified hyperlipidemia type Continue statin as ordered and reviewed, no changes at this time    Current Outpatient Medications on File Prior to Visit  Medication Sig Dispense Refill  . Acetaminophen (TYLENOL) 325 MG CAPS Take 2 tablets by mouth every 6 (six) hours as needed.     Marland Kitchen. aspirin EC 81 MG tablet Take 81 mg by mouth daily.    Marland Kitchen. atorvastatin (LIPITOR) 80 MG tablet TAKE 1 TABLET BY MOUTH EVERYDAY AT BEDTIME 90 tablet 1  . Black Cohosh 540 MG CAPS Take 1 capsule by mouth daily.    . Cholecalciferol (VITAMIN D3) 5000 units CAPS Take 1 capsule (5,000 Units total) by mouth daily. 30 capsule    . clopidogrel (PLAVIX) 75 MG tablet Take 1 tablet (75 mg total) by mouth daily. 90 tablet 1  . docusate sodium (COLACE) 100 MG capsule Take 100 mg by mouth daily.    . DULoxetine (CYMBALTA) 30 MG capsule TAKE 1 CAPSULE BY MOUTH EVERY DAY 90 capsule 1  . Ferrous Sulfate (IRON) 325 (65 Fe) MG TABS Take 1 tablet (325 mg total) by mouth 2 (two) times daily. 30 each 0  . gabapentin (NEURONTIN) 300 MG capsule Take 1 capsule (300 mg total) by mouth 2 (two) times daily. 180 capsule 1  . insulin glargine (LANTUS) 100 UNIT/ML injection INJECT 50 UNITS INTO THE SKIN DAILY AT 10 PM. 20 mL 5  . Insulin Syringe-Needle U-100 (B-D INS SYR ULTRAFINE .3CC/31G) 31G X 5/16" 0.3 ML MISC Use to administer insulin daily to control diabetes. 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 100 each 3  . lisinopril (ZESTRIL) 10 MG tablet Take 1 tablet (10 mg total) by mouth daily. 90 tablet 1  . metFORMIN (GLUCOPHAGE-XR) 500 MG 24 hr tablet Take 2 tablets (1,000 mg total) by mouth daily with breakfast. 180 tablet 5  . triamcinolone cream (KENALOG) 0.1 % APPLY TOPICALLY TWICE A WEEK  1   No current facility-administered medications on file prior to visit.     There are no Patient Instructions on file for this visit. No follow-ups on file.   Georgiana SpinnerFallon E Kaylah Chiasson, NP  This note was completed with Office managerDragon Dictation.  Any errors are purely unintentional.

## 2018-09-13 ENCOUNTER — Other Ambulatory Visit: Payer: Self-pay | Admitting: Family Medicine

## 2018-09-13 DIAGNOSIS — G63 Polyneuropathy in diseases classified elsewhere: Secondary | ICD-10-CM

## 2018-09-20 ENCOUNTER — Encounter: Payer: Self-pay | Admitting: Family Medicine

## 2018-09-20 ENCOUNTER — Ambulatory Visit: Payer: Medicare Other | Admitting: Family Medicine

## 2018-09-20 ENCOUNTER — Other Ambulatory Visit: Payer: Self-pay

## 2018-09-20 VITALS — BP 124/80 | HR 64 | Ht 62.0 in | Wt 168.0 lb

## 2018-09-20 DIAGNOSIS — M549 Dorsalgia, unspecified: Secondary | ICD-10-CM

## 2018-09-20 NOTE — Progress Notes (Signed)
Date:  09/20/2018   Name:  Judy MorgansMildred J Harvey   DOB:  02-Mar-1928   MRN:  [id number]   Chief Complaint: Back Pain (radiating to the front on the R) side- has Hx of shingles)  Pain in thoracic area . Patient is concerned about "PHN". Relates no history of trauma.   Chest Pain  This is a new problem. The current episode started yesterday. The onset quality is sudden. Episode frequency: occured yesterday/lasted throught the night/ decreased this morning. The problem has been gradually improving. Pain location: thoracic. The pain is at a severity of 2/10. The pain is mild. The quality of the pain is described as sharp and burning. Pertinent negatives include no abdominal pain, back pain, cough, diaphoresis, dizziness, exertional chest pressure, fever, headaches, nausea, numbness, shortness of breath, vomiting or weakness. She has tried acetaminophen (gabapentin and duloxetine) for the symptoms.    Review of Systems  Constitutional: Negative.  Negative for chills, diaphoresis, fatigue, fever and unexpected weight change.  HENT: Negative for congestion, ear discharge, ear pain, rhinorrhea, sinus pressure, sneezing and sore throat.   Eyes: Negative for photophobia, pain, discharge, redness and itching.  Respiratory: Negative for cough, shortness of breath, wheezing and stridor.   Cardiovascular: Positive for chest pain. Negative for leg swelling.  Gastrointestinal: Negative for abdominal pain, blood in stool, constipation, diarrhea, nausea and vomiting.  Endocrine: Negative for cold intolerance, heat intolerance, polydipsia, polyphagia and polyuria.  Genitourinary: Negative for dysuria, flank pain, frequency, hematuria, menstrual problem, pelvic pain, urgency, vaginal bleeding and vaginal discharge.  Musculoskeletal: Negative for arthralgias, back pain and myalgias.  Skin: Negative for rash.  Allergic/Immunologic: Negative for environmental allergies and food allergies.  Neurological: Negative for  dizziness, weakness, light-headedness, numbness and headaches.  Hematological: Negative for adenopathy. Does not bruise/bleed easily.  Psychiatric/Behavioral: Negative for dysphoric mood. The patient is not nervous/anxious.     Patient Active Problem List   Diagnosis Date Noted  . 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 Low vitamin B12 level 04/26/2018  . Iron deficiency anemia 03/28/2018  . Frequent PVCs 03/21/2018  . PSVT (paroxysmal supraventricular tachycardia) (HCC) 03/21/2018  . Normocytic anemia 02/28/2018  . Benign essential HTN 02/20/2018  . SOBOE (shortness of breath on exertion) 02/20/2018  . Carotid stenosis, symptomatic w/o infarct, left 10/18/2017  . TIA (transient ischemic attack) 06/16/2017  . Bilateral carotid artery stenosis 06/16/2017  . Hyperlipidemia 06/16/2017  . Obesity (BMI 30.0-34.9) 05/02/2016  . Vitamin D deficiency 03/25/2016  . Type 2 diabetes mellitus without complication, with long-term current use of insulin (HCC) 03/24/2016  . Old cerebrovascular accident (CVA) without late effect 03/24/2016  . Hypertension 03/24/2016  . Legally blind 03/24/2016  . Macular degeneration 03/24/2016  . Constipation 03/24/2016  . History of esophageal stricture 03/24/2016  . Depression with anxiety 03/24/2016  . Restless leg syndrome 03/24/2016  . Postherpetic neuralgia 03/24/2016  . Gait abnormality 03/24/2016    Allergies  Allergen Reactions  . Adhesive [Tape] Other (See Comments)    "pulls my skin off" paper tape ok  . Lyrica [Pregabalin]     Body spasms    Past Surgical History:  Procedure Laterality Date  . ABDOMINAL HYSTERECTOMY     partial  . APPENDECTOMY    . BREAST SURGERY     reduction  . CAROTID PTA/STENT INTERVENTION Left 10/18/2017   Procedure: CAROTID PTA/STENT INTERVENTION;  Surgeon: Annice Needyew, Jason S, MD;  Location: [facility] INVASIVE CV LAB;  Service: Cardiovascular;  Laterality: Left;  .  CATARACT EXTRACTION, BILATERAL     . CESAREAN SECTION     x3  . COLONOSCOPY    . ESOPHAGEAL DILATION      Social History   Tobacco Use  . Smoking status: Former Smoker    Packs/day: 3.00    Years: 50.00    Pack years: 150.00    Types: Cigarettes    Quit date: 2008    Years since quitting: 12.5  . Smokeless tobacco: Never Used  . Tobacco comment: smoking cessation materials not required  Substance Use Topics  . Alcohol use: No  . Drug use: No     Medication list has been reviewed and updated.  Current Meds  Medication Sig  . Acetaminophen (TYLENOL) 325 MG CAPS Take 2 tablets by mouth every 6 (six) hours as needed.   Marland Kitchen aspirin EC 81 MG tablet Take 81 mg by mouth daily.  Marland Kitchen atorvastatin (LIPITOR) 80 MG tablet TAKE 1 TABLET BY MOUTH EVERYDAY AT BEDTIME  . Black Cohosh 540 MG CAPS Take 1 capsule by mouth daily.  . Cholecalciferol (VITAMIN D3) 5000 units CAPS Take 1 capsule (5,000 Units total) by mouth daily.  . clopidogrel (PLAVIX) 75 MG tablet Take 1 tablet (75 mg total) by mouth daily.  Marland Kitchen docusate sodium (COLACE) 100 MG capsule Take 100 mg by mouth daily.  . DULoxetine (CYMBALTA) 30 MG capsule TAKE 1 CAPSULE BY MOUTH EVERY DAY  . Ferrous Sulfate (IRON) 325 (65 Fe) MG TABS Take 1 tablet (325 mg total) by mouth 2 (two) times daily.  Marland Kitchen gabapentin (NEURONTIN) 300 MG capsule Take 1 capsule (300 mg total) by mouth 2 (two) times daily.  . insulin glargine (LANTUS) 100 UNIT/ML injection INJECT 50 UNITS INTO THE SKIN DAILY AT 10 PM.  . Insulin Syringe-Needle U-100 (B-D INS SYR ULTRAFINE .3CC/31G) 31G X 5/16" 0.3 ML MISC Use to administer insulin daily to control diabetes. 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  Marland Kitchen lisinopril (ZESTRIL) 10 MG tablet Take 1 tablet (10 mg total) by mouth daily.  . metFORMIN (GLUCOPHAGE-XR) 500 MG 24 hr tablet Take 2 tablets (1,000 mg total) by mouth daily with breakfast.  . triamcinolone cream (KENALOG) 0.1 % APPLY TOPICALLY TWICE A WEEK    PHQ 2/9 Scores  08/21/2018 04/16/2018 06/16/2017 03/29/2017  PHQ - 2 Score 0 2 0 0  PHQ- 9 Score 0 9 - -    BP Readings from Last 3 Encounters:  09/20/18 124/80  08/24/18 113/74  08/21/18 120/70    Physical Exam Vitals signs and nursing note reviewed.  Constitutional:      General: She is not in acute distress.    Appearance: She is not diaphoretic.  HENT:     Head: Normocephalic and atraumatic.     Right Ear: External ear normal.     Left Ear: External ear normal.     Nose: Nose normal.  Eyes:     General:        Right eye: No discharge.        Left eye: No discharge.     Conjunctiva/sclera: Conjunctivae normal.     Pupils: Pupils are equal, round, and reactive to light.  Neck:     Musculoskeletal: Normal range of motion and neck supple.     Thyroid: No thyromegaly.     Vascular: No JVD.  Cardiovascular:     Rate and Rhythm: Normal rate and regular rhythm.     Heart sounds: Normal heart sounds, S1 normal and S2  normal. No murmur. No systolic murmur. No diastolic murmur. No friction rub. No gallop. No S3 or S4 sounds.   Pulmonary:     Effort: Pulmonary effort is normal.     Breath sounds: Normal breath sounds. No decreased breath sounds, wheezing, rhonchi or rales.  Abdominal:     General: Bowel sounds are normal.     Palpations: Abdomen is soft. There is no mass.     Tenderness: There is no abdominal tenderness. There is no guarding.  Musculoskeletal: Normal range of motion.     Thoracic back: She exhibits tenderness.       Back:     Comments: Point tenderness of costovertebral joint/no progression of tenderness along rib margin  Lymphadenopathy:     Cervical: No cervical adenopathy.  Skin:    General: Skin is warm and dry.     Comments: No vesicular rash noted. Erythematous macular rash under both breasts  Neurological:     Mental Status: She is alert.     Deep Tendon Reflexes: Reflexes are normal and symmetric.     Wt Readings from Last 3 Encounters:  09/20/18 168 lb (76.2 kg)   08/24/18 169 lb (76.7 kg)  08/21/18 168 lb (76.2 kg)    BP 124/80   Pulse 64   Ht 5\' 2"  (1.575 m)   Wt 168 lb (76.2 kg)   BMI 30.73 kg/m   Assessment and Plan: 1. Costovertebral angle tenderness Patient has tenderness of the right proximal fifth or sixth costovertebral joint pain this is exquisitely tender with no tenderness along the corresponding ribs intercostal space.  There is no rash present.  Patient is very frightened that this may represent reemergence of postherpetic neuralgia for which she is already taking gabapentin and Cymbalta.  I have reassured patient that this is unlikely to be zoster in nature and that the pain has now gone away and there is no corresponding rash as a present but that it may fully develop and we may need to consider in the future.  We will call in the morning to reevaluate to see if there is any rash that has emerged.  At this time we will continue our current regimen of aforementioned medications with Tylenol.  If there is breakthrough pain patient may trial some Advil over-the-counter dosing for any further relief necessity.  Patient was informed that it would be difficult to prescribe a controlled natured medication for the pain and that it could affect her sensorium and put her at risk for falls.  If the pain should continue and become intense in nature we could see about having this injected by an Ortho pediatric individual.  I spent 45 minutes with this patient, More than 50% of that time was spent in face to face education, counseling and care coordination.

## 2018-09-20 NOTE — Progress Notes (Signed)
Patient: Judy Harvey, Female    DOB: 1927/07/02, 85 y.o.   MRN: [id number] Visit Date: 09/20/2018  Today's Provider: Elizabeth Sauereanna Breeze Berringer, MD   Chief Complaint  Patient presents with  . Back Pain    radiating to the front on the R) side- has Hx of shingles   Subjective:     ----------------------------------------------------------- HPI  Review of Systems  Social History   Socioeconomic History  . Marital status: Widowed    Spouse name: Not on file  . Number of children: 3  . Years of education: some college  . Highest education level: Not on file  Occupational History  . Occupation: Disabled  Social Needs  . Financial resource strain: Not hard at all  . Food insecurity    Worry: Never true    Inability: Never true  . Transportation needs    Medical: Yes    Non-medical: Yes  Tobacco Use  . Smoking status: Former Smoker    Packs/day: 3.00    Years: 50.00    Pack years: 150.00    Types: Cigarettes    Quit date: 2008    Years since quitting: 12.5  . Smokeless tobacco: Never Used  . Tobacco comment: smoking cessation materials not required  Substance and Sexual Activity  . Alcohol use: No  . Drug use: No  . Sexual activity: Not Currently  Lifestyle  . Physical activity    Days per week: 0 days    Minutes per session: 0 min  . Stress: Not at all  Relationships  . Social connections    Talks on phone: More than three times a week    Gets together: Three times a week    Attends religious service: Never    Active member of club or organization: No    Attends meetings of clubs or organizations: Never    Relationship status: Widowed  . Intimate partner violence    Fear of current or ex partner: Patient refused    Emotionally abused: Patient refused    Physically abused: Patient refused    Forced sexual activity: Patient refused  Other Topics Concern  . Not on file  Social History Narrative   Pt lives with son Loraine LericheMark    Patient Active Problem List   Diagnosis Date Noted  . 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 Low vitamin B12 level 04/26/2018  . Iron deficiency anemia 03/28/2018  . Frequent PVCs 03/21/2018  . PSVT (paroxysmal supraventricular tachycardia) (HCC) 03/21/2018  . Normocytic anemia 02/28/2018  . Benign essential HTN 02/20/2018  . SOBOE (shortness of breath on exertion) 02/20/2018  . Carotid stenosis, symptomatic w/o infarct, left 10/18/2017  . TIA (transient ischemic attack) 06/16/2017  . Bilateral carotid artery stenosis 06/16/2017  . Hyperlipidemia 06/16/2017  . Obesity (BMI 30.0-34.9) 05/02/2016  . Vitamin D deficiency 03/25/2016  . Type 2 diabetes mellitus without complication, with long-term current use of insulin (HCC) 03/24/2016  . Old cerebrovascular accident (CVA) without late effect 03/24/2016  . Hypertension 03/24/2016  . Legally blind 03/24/2016  . Macular degeneration 03/24/2016  . Constipation 03/24/2016  . History of esophageal stricture 03/24/2016  . Depression with anxiety 03/24/2016  . Restless leg syndrome 03/24/2016  . Postherpetic neuralgia 03/24/2016  . Gait abnormality 03/24/2016    Past Surgical History:  Procedure Laterality Date  . ABDOMINAL HYSTERECTOMY     partial  . APPENDECTOMY    . BREAST SURGERY     reduction  . CAROTID PTA/STENT INTERVENTION Left  10/18/2017   Procedure: CAROTID PTA/STENT INTERVENTION;  Surgeon: Annice Needyew, Jason S, MD;  Location: [facility] INVASIVE CV LAB;  Service: Cardiovascular;  Laterality: Left;  . CATARACT EXTRACTION, BILATERAL    . CESAREAN SECTION     x3  . COLONOSCOPY    . ESOPHAGEAL DILATION      Her family history includes Alzheimer's disease in her father; Diabetes in her father; Healthy in her mother; Multiple sclerosis in her brother and sister.     Current Meds  Medication Sig  . Acetaminophen (TYLENOL) 325 MG CAPS Take 2 tablets by mouth every 6 (six) hours as needed.   Marland Kitchen. aspirin EC 81 MG tablet Take 81 mg by mouth daily.   Marland Kitchen. atorvastatin (LIPITOR) 80 MG tablet TAKE 1 TABLET BY MOUTH EVERYDAY AT BEDTIME  . Black Cohosh 540 MG CAPS Take 1 capsule by mouth daily.  . Cholecalciferol (VITAMIN D3) 5000 units CAPS Take 1 capsule (5,000 Units total) by mouth daily.  . clopidogrel (PLAVIX) 75 MG tablet Take 1 tablet (75 mg total) by mouth daily.  Marland Kitchen. docusate sodium (COLACE) 100 MG capsule Take 100 mg by mouth daily.  . DULoxetine (CYMBALTA) 30 MG capsule TAKE 1 CAPSULE BY MOUTH EVERY DAY  . Ferrous Sulfate (IRON) 325 (65 Fe) MG TABS Take 1 tablet (325 mg total) by mouth 2 (two) times daily.  Marland Kitchen. gabapentin (NEURONTIN) 300 MG capsule Take 1 capsule (300 mg total) by mouth 2 (two) times daily.  . insulin glargine (LANTUS) 100 UNIT/ML injection INJECT 50 UNITS INTO THE SKIN DAILY AT 10 PM.  . Insulin Syringe-Needle U-100 (B-D INS SYR ULTRAFINE .3CC/31G) 31G X 5/16" 0.3 ML MISC Use to administer insulin daily to control diabetes. 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  Marland Kitchen. lisinopril (ZESTRIL) 10 MG tablet Take 1 tablet (10 mg total) by mouth daily.  . metFORMIN (GLUCOPHAGE-XR) 500 MG 24 hr tablet Take 2 tablets (1,000 mg total) by mouth daily with breakfast.  . triamcinolone cream (KENALOG) 0.1 % APPLY TOPICALLY TWICE A WEEK    Patient Care Team: Duanne LimerickJones, Evelyn Aguinaldo C, MD as PCP - General (Family Medicine)       Objective:   Wt Readings from Last 3 Encounters:  09/20/18 168 lb (76.2 kg)  08/24/18 169 lb (76.7 kg)  08/21/18 168 lb (76.2 kg)    Vitals: BP 124/80   Pulse 64   Ht 5\' 2"  (1.575 m)   Wt 168 lb (76.2 kg)   BMI 30.73 kg/m   Physical Exam  Activities of Daily Living In your present state of health, do you have any difficulty performing the following activities: 04/16/2018 10/18/2017  Hearing? Y Y  Comment declines hearing aids -  Vision? Malvin JohnsY Y  Comment legally blind -  Difficulty concentrating or making decisions? N N  Walking or climbing stairs? Y Y  Comment does not climb  stairs or walk on uneven surfaces "legs fine, hard to see when stuff in my way"  Dressing or bathing? Y N  Comment bathes independently but needs assitance with picking out clothing -  Doing errands, shopping? Y Y  Comment - cannot drive due to eyesight  Preparing Food and eating ? Y -  Comment cannot see buttons on stove -  Using the Toilet? N -  In the past six months, have you accidently leaked urine? N -  Do you have problems with loss of bowel control? N -  Managing your Medications? N -  Managing your Finances? Y -  Housekeeping or managing your Housekeeping? Y -  Some recent data might be hidden    Fall Risk Assessment Fall Risk  04/16/2018 06/16/2017 03/29/2017 09/26/2016 03/24/2016  Falls in the past year? 1 No No No No  Number falls in past yr: 1 - - - -  Injury with Fall? 0 - - - -  Risk for fall due to : Other (Comment) - Impaired vision;Other (Comment) - -  Risk for fall due to: Comment fall when very anemic - legally blind; use of cane - -  Follow up Falls prevention discussed - - - -     Depression Screen PHQ 2/9 Scores 08/21/2018 04/16/2018 06/16/2017 03/29/2017  PHQ - 2 Score 0 2 0 0  PHQ- 9 Score 0 9 - -    6CIT Screen 04/16/2018 03/29/2017  What Year? 0 points 0 points  What month? 0 points 0 points  What time? 0 points 0 points  Count back from 20 0 points 0 points  Months in reverse 0 points 2 points  Repeat phrase 0 points 2 points  Total Score 0 4    Medicare Annual Wellness Visit Summary:  Reviewed patient's Family Medical History Reviewed and updated list of patient's medical providers Assessment of cognitive impairment was done Assessed patient's functional ability Established a written schedule for health screening Uncertain Completed and Reviewed  Exercise Activities and Dietary recommendations Goals    . DIET - INCREASE WATER INTAKE     Recommend to drink at least 6-8 8oz glasses of water per day.        Immunization History   Administered Date(s) Administered  . Influenza, High Dose Seasonal PF 12/28/2016, 12/18/2017  . Pneumococcal Conjugate-13 03/24/2016  . Pneumococcal Polysaccharide-23 03/29/2017  . Tdap 03/24/2016    Health Maintenance  Topic Date Due  . OPHTHALMOLOGY EXAM  03/25/1939  . INFLUENZA VACCINE  10/13/2018  . FOOT EXAM  12/19/2018  . HEMOGLOBIN A1C  02/20/2019  . TETANUS/TDAP  03/24/2026  . PNA vac Low Risk Adult  Completed  . DEXA SCAN  Discontinued       ------------------------------------------------------------------------------------------------------------  Assessment & Plan:

## 2018-09-27 ENCOUNTER — Telehealth: Payer: Self-pay

## 2018-09-27 NOTE — Telephone Encounter (Signed)
Pt called in stating she was taking Tylenol- 3 tabs every 3 hrs for pain. She wanted to know what else we could give her for pain and to be treated for shingles. I asked if there was a rash that had "came out since last visit" She said no rash that she knew of. I explained to her again that we could not treat for shingles if we don't see any rash. Also informed her that she may need to have a back xray if she is taking that much Tylenol- as that could be harmful to her liver. Pt seemed to be frustrated, but voiced understanding.

## 2018-09-27 NOTE — Telephone Encounter (Signed)
Son called and left VM asking office to call the patient. Called pt and left Vm asking her to call the office and let us know what is needed of [place].  Awaiting call back.

## 2018-10-03 ENCOUNTER — Telehealth: Payer: Self-pay

## 2018-10-03 NOTE — Telephone Encounter (Signed)
Pt called in today c/o "shingles that cannot see." We saw her for this same problem at last visit. No rash was on the skin where she was c/o pain. She is already taking Gabapentin nightly. When calling in today... there is still not a rash that is visible per pt. I informed her that we could either send her to dermatology and let them determine if she has an ongoing case of shingles with no rash or she can come in for lab work to draw and see if she has a positive lab. In which case, Dr Ronnald Ramp can treat for shingles, but still wouldn't give her additional pain medicine that she is asking for. Pt declined both offers.

## 2018-11-27 ENCOUNTER — Other Ambulatory Visit
Admission: RE | Admit: 2018-11-27 | Discharge: 2018-11-27 | Disposition: A | Discharge status: Home / Self Care | Payer: Medicare Other | Source: Ambulatory Visit | Attending: Infectious Diseases | Admitting: Infectious Diseases

## 2018-11-27 DIAGNOSIS — R0600 Dyspnea, unspecified: Secondary | ICD-10-CM | POA: Insufficient documentation

## 2018-11-27 DIAGNOSIS — I10 Essential (primary) hypertension: Secondary | ICD-10-CM | POA: Insufficient documentation

## 2018-11-27 LAB — BRAIN NATRIURETIC PEPTIDE: B Natriuretic Peptide: 76 pg/mL (ref 0.0–100.0)

## 2018-12-26 ENCOUNTER — Other Ambulatory Visit: Payer: Self-pay | Admitting: Infectious Diseases

## 2018-12-26 ENCOUNTER — Ambulatory Visit
Admission: RE | Admit: 2018-12-26 | Discharge: 2018-12-26 | Disposition: A | Discharge status: Home / Self Care | Payer: Medicare Other | Source: Ambulatory Visit | Attending: Infectious Diseases | Admitting: Infectious Diseases

## 2018-12-26 ENCOUNTER — Other Ambulatory Visit: Payer: Self-pay

## 2018-12-26 DIAGNOSIS — M79661 Pain in right lower leg: Secondary | ICD-10-CM | POA: Insufficient documentation

## 2018-12-26 DIAGNOSIS — M5410 Radiculopathy, site unspecified: Secondary | ICD-10-CM | POA: Insufficient documentation

## 2019-02-12 ENCOUNTER — Ambulatory Visit: Payer: Medicare Other | Admitting: Family Medicine

## 2019-02-28 ENCOUNTER — Other Ambulatory Visit: Payer: Self-pay | Admitting: Family Medicine

## 2019-02-28 DIAGNOSIS — F324 Major depressive disorder, single episode, in partial remission: Secondary | ICD-10-CM

## 2019-02-28 DIAGNOSIS — I679 Cerebrovascular disease, unspecified: Secondary | ICD-10-CM

## 2019-03-05 ENCOUNTER — Other Ambulatory Visit: Payer: Self-pay | Admitting: Family Medicine

## 2019-03-05 DIAGNOSIS — F324 Major depressive disorder, single episode, in partial remission: Secondary | ICD-10-CM

## 2019-03-22 ENCOUNTER — Other Ambulatory Visit: Payer: Self-pay | Admitting: Family Medicine

## 2019-03-22 DIAGNOSIS — E785 Hyperlipidemia, unspecified: Secondary | ICD-10-CM

## 2019-06-28 ENCOUNTER — Other Ambulatory Visit: Payer: Self-pay | Admitting: Family Medicine

## 2019-06-28 DIAGNOSIS — Z794 Long term (current) use of insulin: Secondary | ICD-10-CM

## 2019-06-28 DIAGNOSIS — E119 Type 2 diabetes mellitus without complications: Secondary | ICD-10-CM

## 2019-07-14 ENCOUNTER — Other Ambulatory Visit: Payer: Self-pay | Admitting: Family Medicine

## 2019-07-14 DIAGNOSIS — E119 Type 2 diabetes mellitus without complications: Secondary | ICD-10-CM

## 2019-07-14 DIAGNOSIS — Z794 Long term (current) use of insulin: Secondary | ICD-10-CM

## 2019-08-27 ENCOUNTER — Ambulatory Visit (INDEPENDENT_AMBULATORY_CARE_PROVIDER_SITE_OTHER): Payer: Medicare Other | Admitting: Vascular Surgery

## 2019-08-27 ENCOUNTER — Encounter (INDEPENDENT_AMBULATORY_CARE_PROVIDER_SITE_OTHER): Payer: Medicare Other

## 2019-09-24 ENCOUNTER — Other Ambulatory Visit: Payer: Self-pay | Admitting: Family Medicine

## 2019-09-24 DIAGNOSIS — Z794 Long term (current) use of insulin: Secondary | ICD-10-CM

## 2019-10-02 ENCOUNTER — Encounter (INDEPENDENT_AMBULATORY_CARE_PROVIDER_SITE_OTHER): Payer: Self-pay | Admitting: Nurse Practitioner

## 2019-10-02 ENCOUNTER — Ambulatory Visit (INDEPENDENT_AMBULATORY_CARE_PROVIDER_SITE_OTHER): Payer: Medicare Other | Admitting: Nurse Practitioner

## 2019-10-02 ENCOUNTER — Ambulatory Visit (INDEPENDENT_AMBULATORY_CARE_PROVIDER_SITE_OTHER): Payer: Medicare Other

## 2019-10-02 ENCOUNTER — Other Ambulatory Visit: Payer: Self-pay

## 2019-10-02 VITALS — BP 166/77 | HR 80 | Resp 16 | Ht 62.0 in | Wt 176.0 lb

## 2019-10-02 DIAGNOSIS — I6522 Occlusion and stenosis of left carotid artery: Secondary | ICD-10-CM | POA: Diagnosis not present

## 2019-10-02 DIAGNOSIS — E785 Hyperlipidemia, unspecified: Secondary | ICD-10-CM | POA: Diagnosis not present

## 2019-10-02 DIAGNOSIS — I10 Essential (primary) hypertension: Secondary | ICD-10-CM | POA: Diagnosis not present

## 2019-10-02 DIAGNOSIS — I6523 Occlusion and stenosis of bilateral carotid arteries: Secondary | ICD-10-CM | POA: Diagnosis not present

## 2019-10-02 DIAGNOSIS — Z794 Long term (current) use of insulin: Secondary | ICD-10-CM

## 2019-10-02 DIAGNOSIS — E119 Type 2 diabetes mellitus without complications: Secondary | ICD-10-CM

## 2019-10-04 ENCOUNTER — Encounter (INDEPENDENT_AMBULATORY_CARE_PROVIDER_SITE_OTHER): Payer: Self-pay | Admitting: Nurse Practitioner

## 2019-10-04 NOTE — Progress Notes (Signed)
Subjective:    Patient ID: Judy Harvey, female    DOB: 12/28/1927, 86 y.o.   MRN: [id number] Chief Complaint  Patient presents with  . Follow-up    ultrasound    The patient is seen for follow up evaluation of carotid stenosis. The carotid stenosis followed by ultrasound.   The patient denies amaurosis fugax. There is no recent history of TIA symptoms or focal motor deficits. There is no prior documented CVA.  The patient is taking enteric-coated aspirin 81 mg daily.  There is no history of migraine headaches. There is no history of seizures.  The patient has a history of coronary artery disease, no recent episodes of angina or shortness of breath. The patient denies PAD or claudication symptoms. There is a history of hyperlipidemia which is being treated with a statin.    Carotid Duplex done today shows an occluded right internal carotid artery, which was known.  The left internal carotid artery has 1 to 39% stenosis with open patent stents.   Review of Systems  Eyes: Positive for visual disturbance.  Musculoskeletal: Positive for gait problem.  All other systems reviewed and are negative.      Objective:   Physical Exam Vitals reviewed.  HENT:     Head: Normocephalic.  Neck:     Vascular: No carotid bruit.  Cardiovascular:     Rate and Rhythm: Normal rate and regular rhythm.     Pulses: Normal pulses.     Heart sounds: Normal heart sounds.  Pulmonary:     Effort: Pulmonary effort is normal.     Breath sounds: Normal breath sounds.  Neurological:     Mental Status: She is alert and oriented to person, place, and time.     Motor: Weakness present.  Psychiatric:        Mood and Affect: Mood normal.        Behavior: Behavior normal.        Thought Content: Thought content normal.        Judgment: Judgment normal.     BP (!) 166/77 (BP Location: Left Arm)   Pulse 80   Resp 16   Ht 5\' 2"  (1.575 m)   Wt 176 lb (79.8 kg)   BMI 32.19 kg/m   Past  Medical History:  Diagnosis Date  . Anemia   . Depression   . Diabetes mellitus without complication (HCC)   . GERD (gastroesophageal reflux disease)   . Hyperlipidemia   . Hypertension   . Legally blind   . Macular degeneration, bilateral   . Peripheral neuropathy   . Post herpetic neuralgia    located in her back  . Stroke [facility])     Social History   Socioeconomic History  . Marital status: Widowed    Spouse name: Not on file  . Number of children: 3  . Years of education: some college  . Highest education level: Not on file  Occupational History  . Occupation: Disabled  Tobacco Use  . Smoking status: Former Smoker    Packs/day: 3.00    Years: 50.00    Pack years: 150.00    Types: Cigarettes    Quit date: 2008    Years since quitting: 13.5  . Smokeless tobacco: Never Used  . Tobacco comment: smoking cessation materials not required  Vaping Use  . Vaping Use: Never used  Substance and Sexual Activity  . Alcohol use: No  . Drug use: No  . Sexual activity: Not Currently  Other Topics Concern  . Not on file  Social History Narrative   Pt lives with son Judy Harvey   Social Determinants of Health   Financial Resource Strain:   . Difficulty of Paying Living Expenses:   Food Insecurity:   . Worried About Programme researcher, broadcasting/film/video in the Last Year:   . Barista in the Last Year:   Transportation Needs:   . Freight forwarder (Medical):   Marland Kitchen Lack of Transportation (Non-Medical):   Physical Activity:   . Days of Exercise per Week:   . Minutes of Exercise per Session:   Stress:   . Feeling of Stress :   Social Connections:   . Frequency of Communication with Friends and Family:   . Frequency of Social Gatherings with Friends and Family:   . Attends Religious Services:   . Active Member of Clubs or Organizations:   . Attends Banker Meetings:   Marland Kitchen Marital Status:   Intimate Partner Violence:   . Fear of Current or Ex-Partner:   . Emotionally  Abused:   Marland Kitchen Physically Abused:   . Sexually Abused:     Past Surgical History:  Procedure Laterality Date  . ABDOMINAL HYSTERECTOMY     partial  . APPENDECTOMY    . BREAST SURGERY     reduction  . CAROTID PTA/STENT INTERVENTION Left 10/18/2017   Procedure: CAROTID PTA/STENT INTERVENTION;  Surgeon: Annice Needy, MD;  Location: [facility] INVASIVE CV LAB;  Service: Cardiovascular;  Laterality: Left;  . CATARACT EXTRACTION, BILATERAL    . CESAREAN SECTION     x3  . COLONOSCOPY    . ESOPHAGEAL DILATION      Family History  Problem Relation Age of Onset  . Healthy Mother   . Diabetes Father   . Alzheimer's disease Father   . Multiple sclerosis Sister   . Multiple sclerosis Brother     Allergies  Allergen Reactions  . Adhesive [Tape] Other (See Comments)    "pulls my skin off" paper tape ok  . Lyrica [Pregabalin]     Body spasms       Assessment & Plan:   1. Bilateral carotid artery stenosis Recommend:  Given the patient's asymptomatic subcritical stenosis no further invasive testing or surgery at this time.  Duplex ultrasound shows occluded right internal carotid artery with 1 to 39% stenosis of the left.  Continue antiplatelet therapy as prescribed Continue management of CAD, HTN and Hyperlipidemia Healthy heart diet,  encouraged exercise at least 4 times per week Follow up in 12 months with duplex ultrasound and physical exam   2. Benign essential HTN Continue antihypertensive medications as already ordered, these medications have been reviewed and there are no changes at this time.   3. Type 2 diabetes mellitus without complication, with long-term current use of insulin (HCC) Continue hypoglycemic medications as already ordered, these medications have been reviewed and there are no changes at this time.  Hgb A1C to be monitored as already arranged by primary service   4. Hyperlipidemia, unspecified hyperlipidemia type Continue statin as ordered and reviewed, no  changes at this time    Current Outpatient Medications on File Prior to Visit  Medication Sig Dispense Refill  . Acetaminophen (TYLENOL) 325 MG CAPS Take 2 tablets by mouth every 6 (six) hours as needed.     Marland Kitchen aspirin EC 81 MG tablet Take 81 mg by mouth daily.    Marland Kitchen atorvastatin (LIPITOR) 80 MG tablet TAKE 1 TABLET  BY MOUTH EVERYDAY AT BEDTIME 90 tablet 1  . Black Cohosh 540 MG CAPS Take 1 capsule by mouth daily.    . Cholecalciferol (VITAMIN D3) 5000 units CAPS Take 1 capsule (5,000 Units total) by mouth daily. 30 capsule   . clopidogrel (PLAVIX) 75 MG tablet Take 1 tablet (75 mg total) by mouth daily. 90 tablet 1  . docusate sodium (COLACE) 100 MG capsule Take 100 mg by mouth daily.    . DULoxetine (CYMBALTA) 30 MG capsule TAKE 1 CAPSULE BY MOUTH EVERY DAY 90 capsule 1  . Ferrous Sulfate (IRON) 325 (65 Fe) MG TABS Take 1 tablet (325 mg total) by mouth 2 (two) times daily. 30 each 0  . gabapentin (NEURONTIN) 300 MG capsule Take 1 capsule (300 mg total) by mouth 2 (two) times daily. 180 capsule 1  . insulin glargine (LANTUS) 100 UNIT/ML injection INJECT 50 UNITS INTO THE SKIN DAILY AT 10 PM. 20 mL 5  . Insulin Syringe-Needle U-100 (B-D INS SYR ULTRAFINE .3CC/31G) 31G X 5/16" 0.3 ML MISC Use to administer insulin daily to control diabetes. 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 100 each 3  . lisinopril (ZESTRIL) 10 MG tablet Take 1 tablet (10 mg total) by mouth daily. 90 tablet 1  . metFORMIN (GLUCOPHAGE-XR) 500 MG 24 hr tablet Take 2 tablets (1,000 mg total) by mouth daily with breakfast. 180 tablet 5  . senna-docusate (SENOKOT-S) 8.6-50 MG tablet Take 1 tablet by mouth at bedtime.    . traMADol (ULTRAM) 50 MG tablet Take by mouth.    . ondansetron (ZOFRAN) 8 MG tablet Take 8 mg by mouth every 8 (eight) hours as needed.    . pantoprazole (PROTONIX) 40 MG tablet Take 40 mg by mouth 2 (two) times daily.    Marland Kitchen triamcinolone cream (KENALOG) 0.1 % APPLY TOPICALLY  TWICE A WEEK (Patient not taking: Reported on 10/02/2019)  1   No current facility-administered medications on file prior to visit.    There are no Patient Instructions on file for this visit. No follow-ups on file.   Georgiana Spinner, NP

## 2019-11-06 ENCOUNTER — Other Ambulatory Visit: Payer: Self-pay | Admitting: Infectious Diseases

## 2019-11-06 ENCOUNTER — Ambulatory Visit
Admission: RE | Admit: 2019-11-06 | Discharge: 2019-11-06 | Disposition: A | Discharge status: Home / Self Care | Payer: Medicare Other | Source: Ambulatory Visit | Attending: Infectious Diseases | Admitting: Infectious Diseases

## 2019-11-06 ENCOUNTER — Other Ambulatory Visit: Payer: Self-pay

## 2019-11-06 ENCOUNTER — Other Ambulatory Visit
Admission: RE | Admit: 2019-11-06 | Discharge: 2019-11-06 | Disposition: A | Discharge status: Home / Self Care | Payer: Medicare Other | Source: Ambulatory Visit | Attending: Cardiovascular Disease | Admitting: Cardiovascular Disease

## 2019-11-06 DIAGNOSIS — R0602 Shortness of breath: Secondary | ICD-10-CM

## 2019-11-06 DIAGNOSIS — J9601 Acute respiratory failure with hypoxia: Secondary | ICD-10-CM | POA: Insufficient documentation

## 2019-11-06 LAB — BRAIN NATRIURETIC PEPTIDE: B Natriuretic Peptide: 290.6 pg/mL — ABNORMAL HIGH (ref 0.0–100.0)

## 2019-11-06 LAB — POCT I-STAT CREATININE: Creatinine, Ser: 1.3 mg/dL — ABNORMAL HIGH (ref 0.44–1.00)

## 2019-11-06 MED ORDER — IOHEXOL 350 MG/ML SOLN
75.0000 mL | Freq: Once | INTRAVENOUS | Status: AC | PRN
  Administered 2019-11-06: 75 mL via INTRAVENOUS

## 2019-11-24 ENCOUNTER — Encounter: Payer: Self-pay | Admitting: Emergency Medicine

## 2019-11-24 ENCOUNTER — Emergency Department: Payer: Medicare Other

## 2019-11-24 ENCOUNTER — Observation Stay: Payer: Medicare Other

## 2019-11-24 ENCOUNTER — Other Ambulatory Visit: Payer: Self-pay

## 2019-11-24 ENCOUNTER — Inpatient Hospital Stay
Admission: EM | Admit: 2019-11-24 | Discharge: 2019-11-26 | DRG: 069 | Disposition: A | Discharge status: Home Health | Payer: Medicare Other | Attending: Internal Medicine | Admitting: Internal Medicine

## 2019-11-24 DIAGNOSIS — E11649 Type 2 diabetes mellitus with hypoglycemia without coma: Secondary | ICD-10-CM | POA: Diagnosis present

## 2019-11-24 DIAGNOSIS — Z794 Long term (current) use of insulin: Secondary | ICD-10-CM

## 2019-11-24 DIAGNOSIS — Z20822 Contact with and (suspected) exposure to covid-19: Secondary | ICD-10-CM | POA: Diagnosis present

## 2019-11-24 DIAGNOSIS — D649 Anemia, unspecified: Secondary | ICD-10-CM | POA: Diagnosis present

## 2019-11-24 DIAGNOSIS — E7849 Other hyperlipidemia: Secondary | ICD-10-CM

## 2019-11-24 DIAGNOSIS — Z87891 Personal history of nicotine dependence: Secondary | ICD-10-CM

## 2019-11-24 DIAGNOSIS — I6529 Occlusion and stenosis of unspecified carotid artery: Secondary | ICD-10-CM | POA: Diagnosis not present

## 2019-11-24 DIAGNOSIS — R531 Weakness: Secondary | ICD-10-CM | POA: Diagnosis present

## 2019-11-24 DIAGNOSIS — K219 Gastro-esophageal reflux disease without esophagitis: Secondary | ICD-10-CM | POA: Diagnosis present

## 2019-11-24 DIAGNOSIS — Z7902 Long term (current) use of antithrombotics/antiplatelets: Secondary | ICD-10-CM

## 2019-11-24 DIAGNOSIS — Z66 Do not resuscitate: Secondary | ICD-10-CM | POA: Diagnosis present

## 2019-11-24 DIAGNOSIS — Z888 Allergy status to other drugs, medicaments and biological substances status: Secondary | ICD-10-CM

## 2019-11-24 DIAGNOSIS — Z91048 Other nonmedicinal substance allergy status: Secondary | ICD-10-CM

## 2019-11-24 DIAGNOSIS — Z79899 Other long term (current) drug therapy: Secondary | ICD-10-CM

## 2019-11-24 DIAGNOSIS — R1310 Dysphagia, unspecified: Secondary | ICD-10-CM | POA: Diagnosis present

## 2019-11-24 DIAGNOSIS — I10 Essential (primary) hypertension: Secondary | ICD-10-CM | POA: Diagnosis present

## 2019-11-24 DIAGNOSIS — E785 Hyperlipidemia, unspecified: Secondary | ICD-10-CM | POA: Diagnosis present

## 2019-11-24 DIAGNOSIS — Z8673 Personal history of transient ischemic attack (TIA), and cerebral infarction without residual deficits: Secondary | ICD-10-CM

## 2019-11-24 DIAGNOSIS — H548 Legal blindness, as defined in USA: Secondary | ICD-10-CM | POA: Diagnosis present

## 2019-11-24 DIAGNOSIS — Z7982 Long term (current) use of aspirin: Secondary | ICD-10-CM

## 2019-11-24 DIAGNOSIS — F329 Major depressive disorder, single episode, unspecified: Secondary | ICD-10-CM | POA: Diagnosis present

## 2019-11-24 DIAGNOSIS — E1140 Type 2 diabetes mellitus with diabetic neuropathy, unspecified: Secondary | ICD-10-CM | POA: Diagnosis present

## 2019-11-24 DIAGNOSIS — G459 Transient cerebral ischemic attack, unspecified: Principal | ICD-10-CM | POA: Diagnosis present

## 2019-11-24 DIAGNOSIS — H3530 Unspecified macular degeneration: Secondary | ICD-10-CM | POA: Diagnosis present

## 2019-11-24 DIAGNOSIS — Z90710 Acquired absence of both cervix and uterus: Secondary | ICD-10-CM

## 2019-11-24 DIAGNOSIS — G2581 Restless legs syndrome: Secondary | ICD-10-CM | POA: Diagnosis present

## 2019-11-24 DIAGNOSIS — Z9049 Acquired absence of other specified parts of digestive tract: Secondary | ICD-10-CM

## 2019-11-24 LAB — CBC WITH DIFFERENTIAL/PLATELET
Abs Immature Granulocytes: 0.05 10*3/uL (ref 0.00–0.07)
Basophils Absolute: 0 10*3/uL (ref 0.0–0.1)
Basophils Relative: 0 %
Eosinophils Absolute: 0 10*3/uL (ref 0.0–0.5)
Eosinophils Relative: 0 %
HCT: 37.2 % (ref 36.0–46.0)
Hemoglobin: 11.9 g/dL — ABNORMAL LOW (ref 12.0–15.0)
Immature Granulocytes: 1 %
Lymphocytes Relative: 12 %
Lymphs Abs: 1 10*3/uL (ref 0.7–4.0)
MCH: 31 pg (ref 26.0–34.0)
MCHC: 32 g/dL (ref 30.0–36.0)
MCV: 96.9 fL (ref 80.0–100.0)
Monocytes Absolute: 0.4 10*3/uL (ref 0.1–1.0)
Monocytes Relative: 4 %
Neutro Abs: 7.1 10*3/uL (ref 1.7–7.7)
Neutrophils Relative %: 83 %
Platelets: 353 10*3/uL (ref 150–400)
RBC: 3.84 MIL/uL — ABNORMAL LOW (ref 3.87–5.11)
RDW: 14.3 % (ref 11.5–15.5)
WBC: 8.6 10*3/uL (ref 4.0–10.5)
nRBC: 0 % (ref 0.0–0.2)

## 2019-11-24 LAB — URINE DRUG SCREEN, QUALITATIVE (ARMC ONLY)
Amphetamines, Ur Screen: NOT DETECTED
Barbiturates, Ur Screen: NOT DETECTED
Benzodiazepine, Ur Scrn: NOT DETECTED
Cannabinoid 50 Ng, Ur ~~LOC~~: NOT DETECTED
Cocaine Metabolite,Ur ~~LOC~~: NOT DETECTED
MDMA (Ecstasy)Ur Screen: NOT DETECTED
Methadone Scn, Ur: NOT DETECTED
Opiate, Ur Screen: NOT DETECTED
Phencyclidine (PCP) Ur S: NOT DETECTED
Tricyclic, Ur Screen: NOT DETECTED

## 2019-11-24 LAB — COMPREHENSIVE METABOLIC PANEL
ALT: 15 U/L (ref 0–44)
AST: 25 U/L (ref 15–41)
Albumin: 3.1 g/dL — ABNORMAL LOW (ref 3.5–5.0)
Alkaline Phosphatase: 72 U/L (ref 38–126)
Anion gap: 10 (ref 5–15)
BUN: 33 mg/dL — ABNORMAL HIGH (ref 8–23)
CO2: 30 mmol/L (ref 22–32)
Calcium: 8.9 mg/dL (ref 8.9–10.3)
Chloride: 96 mmol/L — ABNORMAL LOW (ref 98–111)
Creatinine, Ser: 1.07 mg/dL — ABNORMAL HIGH (ref 0.44–1.00)
GFR calc Af Amer: 53 mL/min — ABNORMAL LOW (ref >60)
GFR calc non Af Amer: 46 mL/min — ABNORMAL LOW (ref >60)
Glucose, Bld: 91 mg/dL (ref 70–99)
Potassium: 4.5 mmol/L (ref 3.5–5.1)
Sodium: 136 mmol/L (ref 135–145)
Total Bilirubin: 0.7 mg/dL (ref 0.3–1.2)
Total Protein: 7.1 g/dL (ref 6.5–8.1)

## 2019-11-24 LAB — URINALYSIS, ROUTINE W REFLEX MICROSCOPIC
Bilirubin Urine: NEGATIVE
Glucose, UA: NEGATIVE mg/dL
Ketones, ur: NEGATIVE mg/dL
Nitrite: NEGATIVE
Protein, ur: NEGATIVE mg/dL
Specific Gravity, Urine: 1.026 (ref 1.005–1.030)
pH: 5 (ref 5.0–8.0)

## 2019-11-24 LAB — CBC
HCT: 38.5 % (ref 36.0–46.0)
Hemoglobin: 12.5 g/dL (ref 12.0–15.0)
MCH: 30.9 pg (ref 26.0–34.0)
MCHC: 32.5 g/dL (ref 30.0–36.0)
MCV: 95.1 fL (ref 80.0–100.0)
Platelets: 369 10*3/uL (ref 150–400)
RBC: 4.05 MIL/uL (ref 3.87–5.11)
RDW: 14.1 % (ref 11.5–15.5)
WBC: 8.3 10*3/uL (ref 4.0–10.5)
nRBC: 0 % (ref 0.0–0.2)

## 2019-11-24 LAB — PROTIME-INR
INR: 1 (ref 0.8–1.2)
Prothrombin Time: 12.4 seconds (ref 11.4–15.2)

## 2019-11-24 LAB — GLUCOSE, CAPILLARY
Glucose-Capillary: 98 mg/dL (ref 70–99)
Glucose-Capillary: 207 mg/dL — ABNORMAL HIGH (ref 70–99)
Glucose-Capillary: 151 mg/dL — ABNORMAL HIGH (ref 70–99)

## 2019-11-24 LAB — APTT: aPTT: 34 seconds (ref 24–36)

## 2019-11-24 LAB — SARS CORONAVIRUS 2 BY RT PCR (HOSPITAL ORDER, PERFORMED IN ~~LOC~~ HOSPITAL LAB): SARS Coronavirus 2: NEGATIVE

## 2019-11-24 MED ORDER — GABAPENTIN 300 MG PO CAPS
300.0000 mg | ORAL_CAPSULE | Freq: Two times a day (BID) | ORAL | Status: DC
  Administered 2019-11-24 – 2019-11-26 (×4): 300 mg via ORAL
  Filled 2019-11-24 (×5): qty 1

## 2019-11-24 MED ORDER — ONDANSETRON HCL 4 MG/2ML IJ SOLN: 4.0000 mg | Freq: Four times a day (QID) | INTRAMUSCULAR | Status: DC | PRN

## 2019-11-24 MED ORDER — ACETAMINOPHEN 325 MG PO TABS
650.0000 mg | ORAL_TABLET | Freq: Four times a day (QID) | ORAL | Status: DC | PRN
  Administered 2019-11-25: 650 mg via ORAL
  Filled 2019-11-24: qty 2

## 2019-11-24 MED ORDER — ACETAMINOPHEN 650 MG RE SUPP: 650.0000 mg | Freq: Four times a day (QID) | RECTAL | Status: DC | PRN

## 2019-11-24 MED ORDER — ENOXAPARIN SODIUM 40 MG/0.4ML ~~LOC~~ SOLN: 40.0000 mg | SUBCUTANEOUS | Status: DC

## 2019-11-24 MED ORDER — DULOXETINE HCL 30 MG PO CPEP
30.0000 mg | ORAL_CAPSULE | Freq: Every day | ORAL | Status: DC
  Administered 2019-11-25 – 2019-11-26 (×2): 30 mg via ORAL
  Filled 2019-11-24 (×2): qty 1

## 2019-11-24 MED ORDER — PANTOPRAZOLE SODIUM 40 MG PO TBEC
40.0000 mg | DELAYED_RELEASE_TABLET | Freq: Every day | ORAL | Status: DC
  Administered 2019-11-24 – 2019-11-26 (×3): 40 mg via ORAL
  Filled 2019-11-24 (×4): qty 1

## 2019-11-24 MED ORDER — ONDANSETRON HCL 4 MG PO TABS: 4.0000 mg | ORAL_TABLET | Freq: Four times a day (QID) | ORAL | Status: DC | PRN

## 2019-11-24 MED ORDER — TRAMADOL HCL 50 MG PO TABS: 50.0000 mg | ORAL_TABLET | Freq: Four times a day (QID) | ORAL | Status: DC | PRN

## 2019-11-24 MED ORDER — INSULIN ASPART 100 UNIT/ML ~~LOC~~ SOLN: 0.0000 [IU] | Freq: Three times a day (TID) | SUBCUTANEOUS | Status: DC

## 2019-11-24 MED ORDER — ATORVASTATIN CALCIUM 20 MG PO TABS
80.0000 mg | ORAL_TABLET | Freq: Every day | ORAL | Status: DC
  Administered 2019-11-24 – 2019-11-26 (×3): 80 mg via ORAL
  Filled 2019-11-24 (×4): qty 4

## 2019-11-24 MED ORDER — FERROUS SULFATE 325 (65 FE) MG PO TABS
325.0000 mg | ORAL_TABLET | Freq: Two times a day (BID) | ORAL | Status: DC
  Administered 2019-11-24 – 2019-11-26 (×3): 325 mg via ORAL
  Filled 2019-11-24 (×5): qty 1

## 2019-11-24 MED ORDER — BISACODYL 5 MG PO TBEC: 5.0000 mg | DELAYED_RELEASE_TABLET | Freq: Every day | ORAL | Status: DC | PRN

## 2019-11-24 MED ORDER — DOCUSATE SODIUM 100 MG PO CAPS: 200.0000 mg | ORAL_CAPSULE | Freq: Two times a day (BID) | ORAL | Status: DC | PRN

## 2019-11-24 MED ORDER — MORPHINE SULFATE (PF) 2 MG/ML IV SOLN: 1.0000 mg | INTRAVENOUS | Status: DC | PRN

## 2019-11-24 NOTE — ED Notes (Signed)
Pt eating meal from dietary at this time without any difficulty.

## 2019-11-24 NOTE — ED Provider Notes (Signed)
[facility] Emergency Department Provider Note   ____________________________________________    I have reviewed the triage vital signs and the nursing notes.   HISTORY  Chief Complaint Hypoglycemia and Altered Mental Status     HPI Judy Harvey is a 86 y.o. female with a history of diabetes, hypertension, stroke who presents with altered mental status.  Patient describes she woke up last night around 2-3 in the morning and felt quite weak and very thirsty.  She got up to get some water but felt that her legs were going to give out on her and so she called out to her son.  He helped her back into her bed and she went back to sleep after drinking some water.  However when she woke up at some point this morning, she does not know when, she was unable to move her left arm and left leg and was unable to speak correctly.  EMS arrived and did check her glucose which was reportedly 44 and they feel there was improvement after giving D10.  Patient reports she feels much better at this time however is adamant that she was unable to move her left arm and left leg.    Past Medical History:  Diagnosis Date  . Anemia   . Depression   . Diabetes mellitus without complication (HCC)   . GERD (gastroesophageal reflux disease)   . Hyperlipidemia   . Hypertension   . Legally blind   . Macular degeneration, bilateral   . Peripheral neuropathy   . Post herpetic neuralgia    located in her back  . Stroke [facility])     Patient Active Problem List   Diagnosis Date Noted  . 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 Low vitamin B12 level 04/26/2018  . Iron deficiency anemia 03/28/2018  . Frequent PVCs 03/21/2018  . PSVT (paroxysmal supraventricular tachycardia) (HCC) 03/21/2018  . Normocytic anemia 02/28/2018  . Benign essential HTN 02/20/2018  . SOBOE (shortness of breath on exertion) 02/20/2018  . Carotid stenosis, symptomatic w/o infarct,  left 10/18/2017  . TIA (transient ischemic attack) 06/16/2017  . Bilateral carotid artery stenosis 06/16/2017  . Hyperlipidemia 06/16/2017  . Obesity (BMI 30.0-34.9) 05/02/2016  . Vitamin D deficiency 03/25/2016  . Type 2 diabetes mellitus without complication, with long-term current use of insulin (HCC) 03/24/2016  . Old cerebrovascular accident (CVA) without late effect 03/24/2016  . Hypertension 03/24/2016  . Legally blind 03/24/2016  . Macular degeneration 03/24/2016  . Constipation 03/24/2016  . History of esophageal stricture 03/24/2016  . Depression with anxiety 03/24/2016  . Restless leg syndrome 03/24/2016  . Postherpetic neuralgia 03/24/2016  . Gait abnormality 03/24/2016    Past Surgical History:  Procedure Laterality Date  . ABDOMINAL HYSTERECTOMY     partial  . APPENDECTOMY    . BREAST SURGERY     reduction  . CAROTID PTA/STENT INTERVENTION Left 10/18/2017   Procedure: CAROTID PTA/STENT INTERVENTION;  Surgeon: Annice Needy, MD;  Location: [facility] INVASIVE CV LAB;  Service: Cardiovascular;  Laterality: Left;  . CATARACT EXTRACTION, BILATERAL    . CESAREAN SECTION     x3  . COLONOSCOPY    . ESOPHAGEAL DILATION      Prior to Admission medications   Medication Sig Start Date End Date Taking? Authorizing Provider  acetaminophen (TYLENOL) 325 MG tablet Take 650 mg by mouth every 6 (six) hours as needed (pain or fever).    Yes [provider]  albuterol (ACCUNEB) 1.25 MG/3ML nebulizer solution Take 3 mLs by nebulization every 6 (six) hours as needed for wheezing or shortness of breath. 11/12/19  Yes [provider]  albuterol (VENTOLIN HFA) 108 (90 Base) MCG/ACT inhaler Inhale 2 puffs into the lungs every 6 (six) hours as needed for shortness of breath or wheezing. 11/06/19  Yes [provider]  aspirin EC 81 MG tablet Take 81 mg by mouth daily.   Yes [provider]  atorvastatin (LIPITOR) 80 MG tablet TAKE 1 TABLET BY MOUTH EVERYDAY AT  BEDTIME Patient taking differently: Take 80 mg by mouth at bedtime.  08/21/18  Yes Duanne LimerickJones, Deanna C, MD  Black Cohosh 540 MG CAPS Take 1 capsule by mouth daily.   Yes [provider]  chlorpheniramine-HYDROcodone (TUSSIONEX) 10-8 MG/5ML SUER Take 5 mLs by mouth every 12 (twelve) hours as needed for cough. 11/12/19  Yes [provider]  Cholecalciferol (VITAMIN D3) 5000 units CAPS Take 1 capsule (5,000 Units total) by mouth daily. 06/28/16  Yes Plonk, Chrissie NoaWilliam, MD  clopidogrel (PLAVIX) 75 MG tablet Take 1 tablet (75 mg total) by mouth daily. 08/21/18  Yes Duanne LimerickJones, Deanna C, MD  docusate sodium (COLACE) 100 MG capsule Take 100 mg by mouth daily.   Yes [provider]  DULoxetine (CYMBALTA) 30 MG capsule TAKE 1 CAPSULE BY MOUTH EVERY DAY Patient taking differently: Take 30 mg by mouth daily.  08/21/18  Yes Duanne LimerickJones, Deanna C, MD  ferrous sulfate 325 (65 FE) MG tablet Take 325 mg by mouth daily with breakfast.   Yes [provider]  gabapentin (NEURONTIN) 300 MG capsule Take 600 mg by mouth 3 (three) times daily.  10/21/19 01/19/20 Yes [provider]  insulin glargine (LANTUS) 100 UNIT/ML injection INJECT 50 UNITS INTO THE SKIN DAILY AT 10 PM. Patient taking differently: Inject 50 Units into the skin at bedtime.  08/21/18  Yes Duanne LimerickJones, Deanna C, MD  Insulin Syringe-Needle U-100 (B-D INS SYR ULTRAFINE .3CC/31G) 31G X 5/16" 0.3 ML MISC Use to administer insulin daily to control diabetes. 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 08/21/18  Yes Duanne LimerickJones, Deanna C, MD  lisinopril (ZESTRIL) 20 MG tablet Take 20 mg by mouth 2 (two) times daily. 10/02/19  Yes [provider]  metFORMIN (GLUCOPHAGE) 1000 MG tablet Take 1,000 mg by mouth 2 (two) times daily. 10/21/19  Yes [provider]  ondansetron (ZOFRAN) 8 MG tablet Take 8 mg by mouth every 8 (eight) hours as needed for nausea or vomiting.    Yes [provider]  pantoprazole (PROTONIX)  40 MG tablet Take 40 mg by mouth 2 (two) times daily. 07/15/19  Yes [provider]  senna-docusate (SENOKOT-S) 8.6-50 MG tablet Take 1 tablet by mouth at bedtime. 01/05/16  Yes [provider]     Allergies Adhesive [tape] and Lyrica [pregabalin]  Family History  Problem Relation Age of Onset  . Healthy Mother   . Diabetes Father   . Alzheimer's disease Father   . Multiple sclerosis Sister   . Multiple sclerosis Brother     Social History Social History   Tobacco Use  . Smoking status: Former Smoker    Packs/day: 3.00    Years: 50.00    Pack years: 150.00    Types: Cigarettes    Quit date: 2008    Years since quitting: 13.7  . Smokeless tobacco: Never Used  . Tobacco comment: smoking cessation materials not required  Vaping Use  . Vaping Use: Never  used  Substance Use Topics  . Alcohol use: No  . Drug use: No    Review of Systems  Constitutional: No fever/chills Eyes: No visual changes.  ENT: No sore throat. Cardiovascular: Denies chest pain. Respiratory: Denies shortness of breath. Gastrointestinal: No abdominal pain.   Genitourinary: Negative for dysuria. Musculoskeletal: Negative for back pain. Skin: Negative for rash. Neurological: No headache, weakness as above   ____________________________________________   PHYSICAL EXAM:  VITAL SIGNS: ED Triage Vitals  Enc Vitals Group     BP 11/24/19 1500 (!) 185/71     Pulse Rate 11/24/19 1500 70     Resp 11/24/19 1500 20     Temp 11/24/19 1500 97.9 F (36.6 C)     Temp Source 11/24/19 1500 Oral     SpO2 11/24/19 1500 95 %     Weight 11/24/19 1501 73.5 kg (162 lb)     Height 11/24/19 1501 1.575 m (5\' 2" )     Head Circumference --      Peak Flow --      Pain Score 11/24/19 1501 0     Pain Loc --      Pain Edu? --      Excl. in GC? --     Constitutional: Alert and oriented. No acute distress. Pleasant and interactive Eyes: Conjunctivae are normal.  PERRLA, EOMI Head:  Atraumatic. Nose: No congestion/rhinnorhea. Mouth/Throat: Mucous membranes are moist.   Neck:  Painless ROM Cardiovascular: Normal rate, regular rhythm. Grossly normal heart sounds.  Good peripheral circulation. Respiratory: Normal respiratory effort.  No retractions. Lungs CTAB. Gastrointestinal: Soft and nontender. No distention.   Musculoskeletal: Warm and well perfused Neurologic:  Normal speech and language. No gross focal neurologic deficits are appreciated.  Cranial nerves II through XII are normal, no facial droop, Skin:  Skin is warm, dry and intact. No rash noted. Psychiatric: Mood and affect are normal. Speech and behavior are normal.  ____________________________________________   LABS (all labs ordered are listed, but only abnormal results are displayed)  Labs Reviewed  COMPREHENSIVE METABOLIC PANEL - Abnormal; Notable for the following components:      Result Value   Chloride 96 (*)    BUN 33 (*)    Creatinine, Ser 1.07 (*)    Albumin 3.1 (*)    GFR calc non Af Amer 46 (*)    GFR calc Af Amer 53 (*)    All other components within normal limits  URINALYSIS, ROUTINE W REFLEX MICROSCOPIC - Abnormal; Notable for the following components:   Color, Urine YELLOW (*)    APPearance CLOUDY (*)    Hgb urine dipstick LARGE (*)    Leukocytes,Ua MODERATE (*)    Bacteria, UA MANY (*)    All other components within normal limits  CBC WITH DIFFERENTIAL/PLATELET - Abnormal; Notable for the following components:   RBC 3.84 (*)    Hemoglobin 11.9 (*)    All other components within normal limits  SARS CORONAVIRUS 2 BY RT PCR (HOSPITAL ORDER, PERFORMED IN [facility])  GLUCOSE, CAPILLARY  CBC  PROTIME-INR  APTT  URINE DRUG SCREEN, QUALITATIVE ([facility] ONLY)  CBC  BASIC METABOLIC PANEL  CBG MONITORING, ED   ____________________________________________  EKG  ED ECG REPORT I, 01/24/20, the attending physician, personally viewed and interpreted this  ECG.  Date: 11/24/2019 Everything going okay Rhythm: normal sinus rhythm QRS Axis: normal Intervals: Right bundle-branch block ST/T Wave abnormalities: normal Narrative Interpretation: no evidence of acute ischemia  ____________________________________________  RADIOLOGY  CT head ____________________________________________   PROCEDURES  Procedure(s) performed: No  Procedures   Critical Care performed: No ____________________________________________   INITIAL IMPRESSION / ASSESSMENT AND PLAN / ED COURSE  Pertinent labs & imaging results that were available during my care of the patient were reviewed by me and considered in my medical decision making (see chart for details).  Patient presents after altered mental status and extremity weakness as detailed above.  Most suspicious for CVA/TIA although rapid improvement after administration of D10 clouds the picture.  Lab work thus far is quite reassuring, normal white blood cell count, mild elevation of BUN and creatinine, glucose of 91  Patient's exam currently is quite reassuring, no neuro deficits.  However she is hypertensive.  Pending CT head   CT head without acute abnormality.  Analysis demonstrates moderate leukocytes however 11-20 squamous cells, likely contamination. She is not complaining of any dysuria.  Review of medical records demonstrates the patient has had a left carotid stent and has a occluded right carotid artery  She will require admission for further work-up, I have paged the hospitalist service    ____________________________________________   FINAL CLINICAL IMPRESSION(S) / ED DIAGNOSES  Final diagnoses:  TIA (transient ischemic attack)        Note:  This document was prepared using Dragon voice recognition software and may include unintentional dictation errors.   Jene Every, MD 11/24/19 1949

## 2019-11-24 NOTE — ED Triage Notes (Signed)
Pt to ED via [facility] from home for Altered Mental status. Per EMS when they arrived, pt was not able to move her left arm or leg, pt had left sided facial droop, pt had head turned to the left. While EMS was there, pt started talking out of her head with slurred speech and had a syncopal episode, EMS checked CBG and pts CBG was 44, pt was given approximately 100 cc of D 10, last CBG for EMS 90. Pt is alert and talking in complete sentences on arrival. Pt BP 120/80 with EMS, 18 G IV in left forearm. Pt is in NAD.  CBG in ER 98

## 2019-11-24 NOTE — ED Notes (Signed)
Pt reports hx/o TIA, states that she feels the same as she did when she had TIA in the past. Pt reports falling last night

## 2019-11-24 NOTE — ED Notes (Signed)
Assumed care of pt at 1900, denies needs or concerns. [place] at bedside at 1930. Pt then transported to imaging. Awaiting arrival back to room at this time

## 2019-11-24 NOTE — H&P (Addendum)
History and Physical    Judy Harvey MIW:[id number] DOB: [birth year] DOA: 11/24/2019  PCP: Mick Sell, MD  Patient coming from:home    Chief Complaint: left sided weakness  HPI: 86 y/o F w/ PMH of HTN, HLD, DM2, depression, IDA, GERD, likely esophageal stenosis s/p multiple dilatations, CVA, carotid artery stenosis s/p stent placement who presents w/ left sided weakness for unknown duration. All of the hx was obtain from pt who is a poor historian. The left sided weakness has since resolved. Pt denies any difficulty speaking but unsure if she has any trouble walking. Of note, pt has had CVAs in the past with the last one being a couple of years ago. Pt denies any fevers, chills, sweating, cough, chest pain, shortness of breath, nausea, vomiting, abd pain, dysuria, urinary urgency, diarrhea, or constipation.   Review of Systems: As per HPI otherwise 10 point review of systems negative.    Past Medical History:  Diagnosis Date  . Anemia   . Depression   . Diabetes mellitus without complication (HCC)   . GERD (gastroesophageal reflux disease)   . Hyperlipidemia   . Hypertension   . Legally blind   . Macular degeneration, bilateral   . Peripheral neuropathy   . Post herpetic neuralgia    located in her back  . Stroke [facility])     Past Surgical History:  Procedure Laterality Date  . ABDOMINAL HYSTERECTOMY     partial  . APPENDECTOMY    . BREAST SURGERY     reduction  . CAROTID PTA/STENT INTERVENTION Left 10/18/2017   Procedure: CAROTID PTA/STENT INTERVENTION;  Surgeon: Annice Needy, MD;  Location: [facility] INVASIVE CV LAB;  Service: Cardiovascular;  Laterality: Left;  . CATARACT EXTRACTION, BILATERAL    . CESAREAN SECTION     x3  . COLONOSCOPY    . ESOPHAGEAL DILATION       reports that she quit smoking about 13 years ago. Her smoking use included cigarettes. She has a 150.00 pack-year smoking history. She has never used smokeless tobacco. She reports that she does not  drink alcohol and does not use drugs.  Allergies  Allergen Reactions  . Adhesive [Tape] Other (See Comments)    "pulls my skin off" paper tape ok  . Lyrica [Pregabalin]     Body spasms    Family History  Problem Relation Age of Onset  . Healthy Mother   . Diabetes Father   . Alzheimer's disease Father   . Multiple sclerosis Sister   . Multiple sclerosis Brother     Prior to Admission medications   Medication Sig Start Date End Date Taking? Authorizing Provider  Acetaminophen (TYLENOL) 325 MG CAPS Take 2 tablets by mouth every 6 (six) hours as needed.     [provider]  aspirin EC 81 MG tablet Take 81 mg by mouth daily.    [provider]  atorvastatin (LIPITOR) 80 MG tablet TAKE 1 TABLET BY MOUTH EVERYDAY AT BEDTIME 08/21/18   Duanne Limerick, MD  Black Cohosh 540 MG CAPS Take 1 capsule by mouth daily.    [provider]  Cholecalciferol (VITAMIN D3) 5000 units CAPS Take 1 capsule (5,000 Units total) by mouth daily. 06/28/16   Plonk, Chrissie Noa, MD  clopidogrel (PLAVIX) 75 MG tablet Take 1 tablet (75 mg total) by mouth daily. 08/21/18   Duanne Limerick, MD  docusate sodium (COLACE) 100 MG capsule Take 100 mg by mouth daily.    [provider]  DULoxetine (CYMBALTA) 30 MG capsule TAKE 1 CAPSULE BY MOUTH EVERY DAY 08/21/18   Duanne Limerick, MD  Ferrous Sulfate (IRON) 325 (65 Fe) MG TABS Take 1 tablet (325 mg total) by mouth 2 (two) times daily. 02/15/18   Phineas Semen, MD  gabapentin (NEURONTIN) 300 MG capsule Take 1 capsule (300 mg total) by mouth 2 (two) times daily. 09/13/18   Duanne Limerick, MD  insulin glargine (LANTUS) 100 UNIT/ML injection INJECT 50 UNITS INTO THE SKIN DAILY AT 10 PM. 08/21/18   Duanne Limerick, MD  Insulin Syringe-Needle U-100 (B-D INS SYR ULTRAFINE .3CC/31G) 31G X 5/16" 0.3 ML MISC Use to administer insulin daily to control diabetes. 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 08/21/18   Duanne Limerick, MD   lisinopril (ZESTRIL) 10 MG tablet Take 1 tablet (10 mg total) by mouth daily. 08/21/18   Duanne Limerick, MD  metFORMIN (GLUCOPHAGE-XR) 500 MG 24 hr tablet Take 2 tablets (1,000 mg total) by mouth daily with breakfast. 08/21/18   Duanne Limerick, MD  ondansetron (ZOFRAN) 8 MG tablet Take 8 mg by mouth every 8 (eight) hours as needed. 04/23/19   [provider]  pantoprazole (PROTONIX) 40 MG tablet Take 40 mg by mouth 2 (two) times daily. 07/15/19   [provider]  senna-docusate (SENOKOT-S) 8.6-50 MG tablet Take 1 tablet by mouth at bedtime. 01/05/16   [provider]  traMADol (ULTRAM) 50 MG tablet Take by mouth. 01/11/19   [provider]  triamcinolone cream (KENALOG) 0.1 % APPLY TOPICALLY TWICE A WEEK Patient not taking: Reported on 10/02/2019 11/15/17   [provider]    Physical Exam: Vitals:   11/24/19 1500 11/24/19 1501  BP: (!) 185/71   Pulse: 70   Resp: 20   Temp: 97.9 F (36.6 C)   TempSrc: Oral   SpO2: 95%   Weight:  73.5 kg  Height:  5\' 2"  (1.575 m)    Constitutional: NAD, calm, comfortable Vitals:   11/24/19 1500 11/24/19 1501  BP: (!) 185/71   Pulse: 70   Resp: 20   Temp: 97.9 F (36.6 C)   TempSrc: Oral   SpO2: 95%   Weight:  73.5 kg  Height:  5\' 2"  (1.575 m)   Eyes: PERRL, lids and conjunctivae normal ENMT: Mucous membranes are moist.  Neck: normal, supple Respiratory: diminished breath sounds b/l. No rales, wheezes.  Cardiovascular: S1/S2+, no  rubs / gallops.  Abdomen: soft, no tenderness, non-distended.  Bowel sounds positive.  Musculoskeletal: no clubbing / cyanosis. No joint deformity upper and lower extremities.  Skin: no rashes, lesions, ulcers.  Neurologic: decreased strength of b/l LE. CN 2-12 intact. No facial droop Psychiatric: Normal judgment and insight. Alert and oriented x 3. Normal mood.    Labs on Admission: I have personally reviewed following labs and imaging studies  CBC: Recent Labs  Lab  11/24/19 1459 11/24/19 1542  WBC 8.3 8.6  NEUTROABS  --  7.1  HGB 12.5 11.9*  HCT 38.5 37.2  MCV 95.1 96.9  PLT 369 353   Basic Metabolic Panel: Recent Labs  Lab 11/24/19 1459  NA 136  K 4.5  CL 96*  CO2 30  GLUCOSE 91  BUN 33*  CREATININE 1.07*  CALCIUM 8.9   GFR: Estimated Creatinine Clearance: 32.8 mL/min (A) (by C-G formula based on SCr of 1.07 mg/dL (H)). Liver Function Tests: Recent Labs  Lab 11/24/19 1459  AST 25  ALT 15  ALKPHOS  72  BILITOT 0.7  PROT 7.1  ALBUMIN 3.1*   No results for input(s): LIPASE, AMYLASE in the last 168 hours. No results for input(s): AMMONIA in the last 168 hours. Coagulation Profile: Recent Labs  Lab 11/24/19 1542  INR 1.0   Cardiac Enzymes: No results for input(s): CKTOTAL, CKMB, CKMBINDEX, TROPONINI in the last 168 hours. BNP (last 3 results) No results for input(s): PROBNP in the last 8760 hours. HbA1C: No results for input(s): HGBA1C in the last 72 hours. CBG: Recent Labs  Lab 11/24/19 1457  GLUCAP 98   Lipid Profile: No results for input(s): CHOL, HDL, LDLCALC, TRIG, CHOLHDL, LDLDIRECT in the last 72 hours. Thyroid Function Tests: No results for input(s): TSH, T4TOTAL, FREET4, T3FREE, THYROIDAB in the last 72 hours. Anemia Panel: No results for input(s): VITAMINB12, FOLATE, FERRITIN, TIBC, IRON, RETICCTPCT in the last 72 hours. Urine analysis:    Component Value Date/Time   COLORURINE YELLOW (A) 11/24/2019 1542   APPEARANCEUR CLOUDY (A) 11/24/2019 1542   LABSPEC 1.026 11/24/2019 1542   PHURINE 5.0 11/24/2019 1542   GLUCOSEU NEGATIVE 11/24/2019 1542   HGBUR LARGE (A) 11/24/2019 1542   BILIRUBINUR NEGATIVE 11/24/2019 1542   BILIRUBINUR negative 02/19/2018 1557   KETONESUR NEGATIVE 11/24/2019 1542   PROTEINUR NEGATIVE 11/24/2019 1542   UROBILINOGEN 0.2 02/19/2018 1557   NITRITE NEGATIVE 11/24/2019 1542   LEUKOCYTESUR MODERATE (A) 11/24/2019 1542    Radiological Exams on Admission: CT Head Wo  Contrast  Result Date: 11/24/2019 CLINICAL DATA:  Altered mental status, left facial droop and leftward rotation of the head EXAM: CT HEAD WITHOUT CONTRAST TECHNIQUE: Contiguous axial images were obtained from the base of the skull through the vertex without intravenous contrast. COMPARISON:  CT 01/13/2016 FINDINGS: Brain: Scattered punctate hypoattenuating foci in the bilateral basal ganglia and left thalamus likely reflecting areas of prior lacunar type infarct. No evidence of acute infarction, hemorrhage, hydrocephalus, extra-axial collection or mass lesion/mass effect. Basal cisterns are patent. Midline intracranial structures are unremarkable. Cerebellar tonsils are normally positioned. Symmetric prominence of the ventricles, cisterns and sulci compatible with mild-to-moderate parenchymal volume loss. Patchy and more confluent areas of white matter hypoattenuation are most compatible with stable moderate chronic microvascular angiopathy. Vascular: Atherosclerotic calcification of the carotid siphons and intradural vertebral arteries. No hyperdense vessel. Skull: No calvarial fracture or suspicious osseous lesion. Benign hyperostosis frontalis interna, similar to prior. No scalp swelling or hematoma. Sinuses/Orbits: Few pneumatized secretions present in the right ethmoid air cells. Remaining paranasal sinuses are predominantly clear. Trace right mastoid effusion. Orbital structures are unremarkable aside from prior lens extractions. Other: None. IMPRESSION: 1. No acute intracranial findings. If there is persisting clinical concern for acute infarct, MRI is more sensitive and specific. 2. Stable moderate chronic microvascular angiopathy and parenchymal volume loss. 3. Remote lacunar infarcts in the bilateral basal ganglia and left thalamus. 4. Few pneumatized secretions in the right ethmoid air cells could correlate for clinical features of sinusitis. 5. Trace right mastoid effusion. Electronically Signed   By:  Kreg ShropshirePrice  DeHay M.D.   On: 11/24/2019 16:49    EKG: Independently reviewed.   Assessment/Plan Active Problems:   * No active hospital problems. *  Left sided weakness: that since resolved. Etiology unclear, possible TIA vs CVA. CT brain shows no acute intracranial findings but remote lacunar infarcts of b/l basal ganglia & left thalamus. MRI brain ordered. Hx of carotid stent, so US carotids ordered. Will hold home dose of aspirin & plavix until MRI brain is completed & no  hemorrhagic CVA noted. Will consult neuro if MRI is positive for CVA. Speech consulted. PT/OT consulted  HTN: will hold home dose of lisinopril until CVA r/o to allow permissive HTN   HLD: will continue on statin   DM2: will hold home dose of metformin. Will continue on SSI w/ accuchecks  Depression: severity unknown. Will continue on home dose of duloxetine  Peripheral neuropathy: will continue on home dose of gabapentin  GERD: will continue on PPI  DVT prophylaxis: SCDs Code Status: DNR Family Communication:  Disposition Plan: depends on PT/OT recs Consults called: Admission status: observation    Charise Killian MD [facility] Pager [phone]   If 7PM-7AM, please contact night-coverage [email or web address]   11/24/2019, 5:20 PM

## 2019-11-24 NOTE — ED Notes (Signed)
Dietary contacted to send pt soft diet at this time

## 2019-11-24 NOTE — ED Notes (Signed)
Report given to Julie,RN. 

## 2019-11-25 DIAGNOSIS — Z888 Allergy status to other drugs, medicaments and biological substances status: Secondary | ICD-10-CM | POA: Diagnosis not present

## 2019-11-25 DIAGNOSIS — E7849 Other hyperlipidemia: Secondary | ICD-10-CM | POA: Diagnosis not present

## 2019-11-25 DIAGNOSIS — G459 Transient cerebral ischemic attack, unspecified: Secondary | ICD-10-CM | POA: Diagnosis present

## 2019-11-25 DIAGNOSIS — Z7902 Long term (current) use of antithrombotics/antiplatelets: Secondary | ICD-10-CM | POA: Diagnosis not present

## 2019-11-25 DIAGNOSIS — Z794 Long term (current) use of insulin: Secondary | ICD-10-CM

## 2019-11-25 DIAGNOSIS — I6529 Occlusion and stenosis of unspecified carotid artery: Secondary | ICD-10-CM | POA: Diagnosis present

## 2019-11-25 DIAGNOSIS — K219 Gastro-esophageal reflux disease without esophagitis: Secondary | ICD-10-CM | POA: Diagnosis present

## 2019-11-25 DIAGNOSIS — Z91048 Other nonmedicinal substance allergy status: Secondary | ICD-10-CM | POA: Diagnosis not present

## 2019-11-25 DIAGNOSIS — E11649 Type 2 diabetes mellitus with hypoglycemia without coma: Secondary | ICD-10-CM | POA: Diagnosis present

## 2019-11-25 DIAGNOSIS — Z87891 Personal history of nicotine dependence: Secondary | ICD-10-CM | POA: Diagnosis not present

## 2019-11-25 DIAGNOSIS — E785 Hyperlipidemia, unspecified: Secondary | ICD-10-CM | POA: Diagnosis present

## 2019-11-25 DIAGNOSIS — I10 Essential (primary) hypertension: Secondary | ICD-10-CM | POA: Diagnosis present

## 2019-11-25 DIAGNOSIS — R531 Weakness: Secondary | ICD-10-CM | POA: Diagnosis present

## 2019-11-25 DIAGNOSIS — Z79899 Other long term (current) drug therapy: Secondary | ICD-10-CM | POA: Diagnosis not present

## 2019-11-25 DIAGNOSIS — E1140 Type 2 diabetes mellitus with diabetic neuropathy, unspecified: Secondary | ICD-10-CM | POA: Diagnosis present

## 2019-11-25 DIAGNOSIS — F329 Major depressive disorder, single episode, unspecified: Secondary | ICD-10-CM | POA: Diagnosis present

## 2019-11-25 DIAGNOSIS — Z8673 Personal history of transient ischemic attack (TIA), and cerebral infarction without residual deficits: Secondary | ICD-10-CM | POA: Diagnosis not present

## 2019-11-25 DIAGNOSIS — H3530 Unspecified macular degeneration: Secondary | ICD-10-CM | POA: Diagnosis present

## 2019-11-25 DIAGNOSIS — E119 Type 2 diabetes mellitus without complications: Secondary | ICD-10-CM

## 2019-11-25 DIAGNOSIS — R1310 Dysphagia, unspecified: Secondary | ICD-10-CM | POA: Diagnosis present

## 2019-11-25 DIAGNOSIS — Z7982 Long term (current) use of aspirin: Secondary | ICD-10-CM | POA: Diagnosis not present

## 2019-11-25 DIAGNOSIS — Z90710 Acquired absence of both cervix and uterus: Secondary | ICD-10-CM | POA: Diagnosis not present

## 2019-11-25 DIAGNOSIS — Z66 Do not resuscitate: Secondary | ICD-10-CM | POA: Diagnosis present

## 2019-11-25 DIAGNOSIS — D649 Anemia, unspecified: Secondary | ICD-10-CM | POA: Diagnosis present

## 2019-11-25 DIAGNOSIS — I6521 Occlusion and stenosis of right carotid artery: Secondary | ICD-10-CM

## 2019-11-25 DIAGNOSIS — I6523 Occlusion and stenosis of bilateral carotid arteries: Secondary | ICD-10-CM | POA: Diagnosis not present

## 2019-11-25 DIAGNOSIS — H548 Legal blindness, as defined in USA: Secondary | ICD-10-CM | POA: Diagnosis present

## 2019-11-25 DIAGNOSIS — Z20822 Contact with and (suspected) exposure to covid-19: Secondary | ICD-10-CM | POA: Diagnosis present

## 2019-11-25 DIAGNOSIS — G2581 Restless legs syndrome: Secondary | ICD-10-CM | POA: Diagnosis present

## 2019-11-25 LAB — CBC
HCT: 36.2 % (ref 36.0–46.0)
Hemoglobin: 11.7 g/dL — ABNORMAL LOW (ref 12.0–15.0)
MCH: 31.1 pg (ref 26.0–34.0)
MCHC: 32.3 g/dL (ref 30.0–36.0)
MCV: 96.3 fL (ref 80.0–100.0)
Platelets: 329 10*3/uL (ref 150–400)
RBC: 3.76 MIL/uL — ABNORMAL LOW (ref 3.87–5.11)
RDW: 14.2 % (ref 11.5–15.5)
WBC: 8.2 10*3/uL (ref 4.0–10.5)
nRBC: 0 % (ref 0.0–0.2)

## 2019-11-25 LAB — BASIC METABOLIC PANEL
Anion gap: 8 (ref 5–15)
BUN: 25 mg/dL — ABNORMAL HIGH (ref 8–23)
CO2: 30 mmol/L (ref 22–32)
Calcium: 8.6 mg/dL — ABNORMAL LOW (ref 8.9–10.3)
Chloride: 98 mmol/L (ref 98–111)
Creatinine, Ser: 0.95 mg/dL (ref 0.44–1.00)
GFR calc Af Amer: >60 mL/min (ref >60)
GFR calc non Af Amer: 53 mL/min — ABNORMAL LOW (ref >60)
Glucose, Bld: 164 mg/dL — ABNORMAL HIGH (ref 70–99)
Potassium: 4 mmol/L (ref 3.5–5.1)
Sodium: 136 mmol/L (ref 135–145)

## 2019-11-25 LAB — GLUCOSE, CAPILLARY
Glucose-Capillary: 157 mg/dL — ABNORMAL HIGH (ref 70–99)
Glucose-Capillary: 159 mg/dL — ABNORMAL HIGH (ref 70–99)
Glucose-Capillary: 164 mg/dL — ABNORMAL HIGH (ref 70–99)
Glucose-Capillary: 224 mg/dL — ABNORMAL HIGH (ref 70–99)
Glucose-Capillary: 239 mg/dL — ABNORMAL HIGH (ref 70–99)
Glucose-Capillary: 43 mg/dL — CL (ref 70–99)
Glucose-Capillary: 77 mg/dL (ref 70–99)
Glucose-Capillary: 82 mg/dL (ref 70–99)
Glucose-Capillary: 99 mg/dL (ref 70–99)

## 2019-11-25 MED ORDER — DEXTROSE 10 % IV SOLN: INTRAVENOUS | Status: DC

## 2019-11-25 MED ORDER — CLOPIDOGREL BISULFATE 75 MG PO TABS
75.0000 mg | ORAL_TABLET | Freq: Every day | ORAL | Status: DC
  Administered 2019-11-25 – 2019-11-26 (×2): 75 mg via ORAL
  Filled 2019-11-25 (×2): qty 1

## 2019-11-25 MED ORDER — DEXTROSE 50 % IV SOLN: 1.0000 | Freq: Once | INTRAVENOUS | Status: AC

## 2019-11-25 MED ORDER — DEXTROSE 50 % IV SOLN
INTRAVENOUS | Status: AC
  Administered 2019-11-25: 1 via INTRAVENOUS
  Filled 2019-11-25: qty 50

## 2019-11-25 MED ORDER — ASPIRIN EC 81 MG PO TBEC
81.0000 mg | DELAYED_RELEASE_TABLET | Freq: Every day | ORAL | Status: DC
  Administered 2019-11-25 – 2019-11-26 (×2): 81 mg via ORAL
  Filled 2019-11-25 (×2): qty 1

## 2019-11-25 MED ORDER — LISINOPRIL 10 MG PO TABS
40.0000 mg | ORAL_TABLET | Freq: Every day | ORAL | Status: DC
  Administered 2019-11-25: 40 mg via ORAL
  Filled 2019-11-25 (×2): qty 4

## 2019-11-25 NOTE — Progress Notes (Signed)
PROGRESS NOTE    Judy MorgansMildred J Harvey  ZOX:[id number] DOB: October 20, 1927 DOA: 11/24/2019 PCP: Mick SellFitzgerald, David P, MD   Assessment & Plan:   Active Problems:   TIA (transient ischemic attack)  Left sided weakness: that since resolved. Etiology unclear, possible TIA vs CVA. CT brain shows no acute intracranial findings but remote lacunar infarcts of b/l basal ganglia & left thalamus. MRI brain shows no acute intracranial abnormality & remote lacunar infarcts & probable prior hemorrhagic infarct. Will continue on home dose of aspirin, plavix. Continue on dysphagia as per speech. PT recs home health. OT pending   Hx of carotid stent: US of carotid shows right ICA occlusion unknown chronicity & left carotid shows 50-69% stenosis. Vascular surg consulted   Hx of esophageal stenosis: s/p multiple dilations. Continue on dysphagia diet as per speech   HTN: will restart home dose of lisinopril   HLD: will continue on statin   DM2: will hold home dose of metformin. Will continue on SSI w/ accuchecks  Depression: severity unknown. Will continue on home dose of duloxetine  Peripheral neuropathy: will continue on home dose of gabapentin  GERD: will continue on PPI    DVT prophylaxis: lovenox  Code Status: DNR Family Communication:  Disposition Plan: depends on PT/OT recs   Consultants:      Procedures:    Antimicrobials:    Subjective: Pt c/o being hungry   Objective: Vitals:   11/25/19 0230 11/25/19 0400 11/25/19 0500 11/25/19 0600  BP: 133/84 (!) 136/122 (!) 134/54 139/68  Pulse: 85 85 85 82  Resp: 16 20 19 18   Temp:      TempSrc:      SpO2: 95% 100% 95% 97%  Weight:      Height:       No intake or output data in the 24 hours ending 11/25/19 1057 Filed Weights   11/24/19 1501  Weight: 73.5 kg    Examination:  General exam: Appears calm and comfortable  Respiratory system: decreased breath sounds b/l. Cardiovascular system: S1 & S2 +. No rubs, gallops or  clicks.  Gastrointestinal system: Abdomen is nondistended, soft and nontender. Hypoactive bowel sounds heard. Central nervous system: Alert and oriented. Moves all 4 extremities  Psychiatry: Judgement and insight appear normal. Mood & affect appropriate.     Data Reviewed: I have personally reviewed following labs and imaging studies  CBC: Recent Labs  Lab 11/24/19 1459 11/24/19 1542 11/25/19 0412  WBC 8.3 8.6 8.2  NEUTROABS  --  7.1  --   HGB 12.5 11.9* 11.7*  HCT 38.5 37.2 36.2  MCV 95.1 96.9 96.3  PLT 369 353 329   Basic Metabolic Panel: Recent Labs  Lab 11/24/19 1459 11/25/19 0412  NA 136 136  K 4.5 4.0  CL 96* 98  CO2 30 30  GLUCOSE 91 164*  BUN 33* 25*  CREATININE 1.07* 0.95  CALCIUM 8.9 8.6*   GFR: Estimated Creatinine Clearance: 37 mL/min (by C-G formula based on SCr of 0.95 mg/dL). Liver Function Tests: Recent Labs  Lab 11/24/19 1459  AST 25  ALT 15  ALKPHOS 72  BILITOT 0.7  PROT 7.1  ALBUMIN 3.1*   No results for input(s): LIPASE, AMYLASE in the last 168 hours. No results for input(s): AMMONIA in the last 168 hours. Coagulation Profile: Recent Labs  Lab 11/24/19 1542  INR 1.0   Cardiac Enzymes: No results for input(s): CKTOTAL, CKMB, CKMBINDEX, TROPONINI in the last 168 hours. BNP (last 3 results) No results for input(s):  PROBNP in the last 8760 hours. HbA1C: No results for input(s): HGBA1C in the last 72 hours. CBG: Recent Labs  Lab 11/25/19 0305 11/25/19 0317 11/25/19 0421 11/25/19 0630 11/25/19 1053  GLUCAP 43* 239* 157* 99 77   Lipid Profile: No results for input(s): CHOL, HDL, LDLCALC, TRIG, CHOLHDL, LDLDIRECT in the last 72 hours. Thyroid Function Tests: No results for input(s): TSH, T4TOTAL, FREET4, T3FREE, THYROIDAB in the last 72 hours. Anemia Panel: No results for input(s): VITAMINB12, FOLATE, FERRITIN, TIBC, IRON, RETICCTPCT in the last 72 hours. Sepsis Labs: No results for input(s): PROCALCITON, LATICACIDVEN in the  last 168 hours.  Recent Results (from the past 240 hour(s))  SARS Coronavirus 2 by RT PCR (hospital order, performed in [facility] hospital lab) Nasopharyngeal Nasopharyngeal Swab     Status: None   Collection Time: 11/24/19  5:40 PM   Specimen: Nasopharyngeal Swab  Result Value Ref Range Status   SARS Coronavirus 2 NEGATIVE NEGATIVE Final    Comment: (NOTE) SARS-CoV-2 target nucleic acids are NOT DETECTED.  The SARS-CoV-2 RNA is generally detectable in upper and lower respiratory specimens during the acute phase of infection. The lowest concentration of SARS-CoV-2 viral copies this assay can detect is 250 copies / mL. A negative result does not preclude SARS-CoV-2 infection and should not be used as the sole basis for treatment or other patient management decisions.  A negative result may occur with improper specimen collection / handling, submission of specimen other than nasopharyngeal swab, presence of viral mutation(s) within the areas targeted by this assay, and inadequate number of viral copies (<250 copies / mL). A negative result must be combined with clinical observations, patient history, and epidemiological information.  Fact Sheet for Patients:   BoilerBrush.com.cy  Fact Sheet for Healthcare Providers: https://pope.com/  This test is not yet approved or  cleared by the [place] FDA and has been authorized for detection and/or diagnosis of SARS-CoV-2 by FDA under an Emergency Use Authorization (EUA).  This EUA will remain in effect (meaning this test can be used) for the duration of the COVID-19 declaration under Section 564(b)(1) of the Act, 21 U.S.C. section 360bbb-3(b)(1), unless the authorization is terminated or revoked sooner.  Performed at [facility], [address], [place], [place] [address]          Radiology Studies: CT Head Wo Contrast  Result Date: 11/24/2019 CLINICAL DATA:   Altered mental status, left facial droop and leftward rotation of the head EXAM: CT HEAD WITHOUT CONTRAST TECHNIQUE: Contiguous axial images were obtained from the base of the skull through the vertex without intravenous contrast. COMPARISON:  CT 01/13/2016 FINDINGS: Brain: Scattered punctate hypoattenuating foci in the bilateral basal ganglia and left thalamus likely reflecting areas of prior lacunar type infarct. No evidence of acute infarction, hemorrhage, hydrocephalus, extra-axial collection or mass lesion/mass effect. Basal cisterns are patent. Midline intracranial structures are unremarkable. Cerebellar tonsils are normally positioned. Symmetric prominence of the ventricles, cisterns and sulci compatible with mild-to-moderate parenchymal volume loss. Patchy and more confluent areas of white matter hypoattenuation are most compatible with stable moderate chronic microvascular angiopathy. Vascular: Atherosclerotic calcification of the carotid siphons and intradural vertebral arteries. No hyperdense vessel. Skull: No calvarial fracture or suspicious osseous lesion. Benign hyperostosis frontalis interna, similar to prior. No scalp swelling or hematoma. Sinuses/Orbits: Few pneumatized secretions present in the right ethmoid air cells. Remaining paranasal sinuses are predominantly clear. Trace right mastoid effusion. Orbital structures are unremarkable aside from prior lens extractions. Other: None. IMPRESSION: 1.  No acute intracranial findings. If there is persisting clinical concern for acute infarct, MRI is more sensitive and specific. 2. Stable moderate chronic microvascular angiopathy and parenchymal volume loss. 3. Remote lacunar infarcts in the bilateral basal ganglia and left thalamus. 4. Few pneumatized secretions in the right ethmoid air cells could correlate for clinical features of sinusitis. 5. Trace right mastoid effusion. Electronically Signed   By: Kreg Shropshire M.D.   On: 11/24/2019 16:49   MR  BRAIN WO CONTRAST  Result Date: 11/24/2019 CLINICAL DATA:  Initial evaluation for acute altered mental status, TIA. EXAM: MRI HEAD WITHOUT CONTRAST TECHNIQUE: Multiplanar, multiecho pulse sequences of the brain and surrounding structures were obtained without intravenous contrast. COMPARISON:  Prior head CT from earlier the same day. FINDINGS: Brain: Examination mildly degraded by motion. Diffuse prominence of the CSF containing spaces compatible generalized age-related cerebral atrophy. Patchy and confluent T2/FLAIR hyperintensity within the periventricular deep white matter both cerebral hemispheres, consistent with chronic microvascular ischemic disease, moderate in nature. Prominent involvement of the pons and thalami noted as well. Few scatter remote lacunar infarcts noted about the basal ganglia and thalami. Area of chronic hemosiderin staining with suggestion of underlying encephalomalacia at the parasagittal right parieto-occipital region, likely reflecting a remote hemorrhagic infarct (series 13, image 31). No abnormal foci of restricted diffusion to suggest acute or subacute ischemia. Gray-white matter differentiation otherwise maintained. No other areas of remote cortical infarction. Punctate chronic microhemorrhage noted within the right cerebellum, likely related to underlying hypertension. No other evidence for acute or chronic intracranial hemorrhage. No mass lesion, midline shift or mass effect. No hydrocephalus or extra-axial fluid collection. Pituitary gland suprasellar region normal. Midline structures intact. Vascular: Loss of normal flow void within the right ICA to the terminus, likely occluded and presumably chronic in nature. Major intracranial vascular flow voids otherwise maintained. Skull and upper cervical spine: Craniocervical junction within normal limits. Bone marrow signal intensity normal. Mild hyperostosis frontalis interna noted. No scalp soft tissue abnormality. Sinuses/Orbits:  Patient status post bilateral ocular lens replacement. Mild scattered mucosal thickening noted within the ethmoidal air cells. Paranasal sinuses are otherwise clear. Moderate bilateral mastoid effusions, of doubtful significance. Visualized nasopharynx within normal limits. Other: None. IMPRESSION: 1. No acute intracranial abnormality. 2. Abnormal flow void within the right ICA to the level of the terminus, likely occluded. Finding presumably chronic in nature. 3. Age-related cerebral atrophy with moderate chronic microvascular ischemic disease, with a few scattered remote lacunar infarcts about the basal ganglia and thalami. 4. Probable prior hemorrhagic infarct at the parasagittal right parieto-occipital junction. Electronically Signed   By: Rise Mu M.D.   On: 11/24/2019 21:33   US Carotid Bilateral  Result Date: 11/25/2019 CLINICAL DATA:  86 year old female with a history carotid artery disease EXAM: BILATERAL CAROTID DUPLEX ULTRASOUND TECHNIQUE: Wallace Cullens scale imaging, color Doppler and duplex ultrasound were performed of bilateral carotid and vertebral arteries in the neck. COMPARISON:  None. FINDINGS: Criteria: Quantification of carotid stenosis is based on velocity parameters that correlate the residual internal carotid diameter with NASCET-based stenosis levels, using the diameter of the distal internal carotid lumen as the denominator for stenosis measurement. The following velocity measurements were obtained: RIGHT ICA: Occlusion CCA:  44 cm/sec SYSTOLIC ICA/CCA RATIO:  N/a ECA:  108 cm/sec LEFT ICA:  Systolic 131 cm/sec, Diastolic 43 cm/sec CCA:  69 cm/sec SYSTOLIC ICA/CCA RATIO:  2.0 ECA:  188 cm/sec Right Brachial SBP: Not acquired Left Brachial SBP: Not acquired RIGHT CAROTID ARTERY: Calcification of the right common  carotid artery. Intermediate waveform maintained. ICA occlusion. There is minimal arterial signal identified on the duplex images within the ICA, potentially vaso vasorum.  RIGHT VERTEBRAL ARTERY: Antegrade flow with low resistance waveform. LEFT CAROTID ARTERY: Calcifications of the left common carotid artery. Intermediate waveform maintained. Moderate heterogeneous and partially calcified plaque at the left carotid bifurcation. No significant lumen shadowing. Low resistance waveform of the left ICA. No significant tortuosity. LEFT VERTEBRAL ARTERY:  Antegrade flow with low resistance waveform. IMPRESSION: Right: Right ICA occlusion of indeterminate chronicity. Left: Heterogeneous and partially calcified plaque at the left carotid bifurcation contributes to 50%-69% stenosis by established duplex criteria. Signed, Yvone Neu. Reyne Dumas, RPVI [facility] Electronically Signed   By: Gilmer Mor D.O.   On: 11/25/2019 07:49        Scheduled Meds: . aspirin EC  81 mg Oral Daily  . atorvastatin  80 mg Oral Daily  . clopidogrel  75 mg Oral Daily  . DULoxetine  30 mg Oral Daily  . ferrous sulfate  325 mg Oral BID WC  . gabapentin  300 mg Oral BID  . lisinopril  40 mg Oral Daily  . pantoprazole  40 mg Oral Daily   Continuous Infusions: . dextrose 50 mL/hr at 11/25/19 0340     LOS: 0 days    Time spent: 33  mins     Charise Killian, MD [facility] Pager [phone]  If 7PM-7AM, please contact night-coverage  11/25/2019, 10:57 AM

## 2019-11-25 NOTE — Consult Note (Signed)
[facility] Vascular Consult Note  MRN : [id number]  Judy Harvey is a 86 y.o. (01/27/1928) female who presents with chief complaint of  Chief Complaint  Patient presents with  . Hypoglycemia  . Altered Mental Status   History of Present Illness:  The patient is a 86 year old female with a past medical history of hypertension, hyperlipidemia, diabetes, depression, GERD, CVA, with known carotid artery disease / right internal carotid artery occlusion who presents to the emergency department complaining of left-sided weakness.  Patient is well-known to our service as we have treated her carotid artery disease in the past.  Patient with known right ICA occlusion and moderate disease on the left internal carotid artery.   At this time, the patient notes that her left-sided weakness has resolved. CT brain shows no acute intracranial findings but remote lacunar infarcts of b/l basal ganglia & left thalamus. MRI brain shows no acute intracranial abnormality & remote lacunar infarcts & probable prior hemorrhagic infarct.  Patient was last seen in our office on October 02, 2019. Duplex ultrasound during this visit was notable for occluded right internal carotid artery with 1 to 39% stenosis of the left. Given the patient's asymptomatic subcritical stenosis no further invasive testing or surgery at this time and yearly follow-up was recommended.  Vascular surgery was consulted by Dr. Jimmye Norman for carotid artery disease.  Current Facility-Administered Medications  Medication Dose Route Frequency Provider Last Rate Last Admin  . acetaminophen (TYLENOL) tablet 650 mg  650 mg Oral Q6H PRN Wyvonnia Dusky, MD       Or  . acetaminophen (TYLENOL) suppository 650 mg  650 mg Rectal Q6H PRN Wyvonnia Dusky, MD      . aspirin EC tablet 81 mg  81 mg Oral Daily Wyvonnia Dusky, MD   81 mg at 11/25/19 1327  . atorvastatin (LIPITOR) tablet 80 mg  80 mg Oral Daily Wyvonnia Dusky, MD   80 mg at 11/25/19 1326  . bisacodyl (DULCOLAX) EC tablet 5 mg  5 mg Oral Daily PRN Wyvonnia Dusky, MD      . clopidogrel (PLAVIX) tablet 75 mg  75 mg Oral Daily Wyvonnia Dusky, MD   75 mg at 11/25/19 1327  . dextrose 10 % infusion   Intravenous Continuous Sharion Settler, NP 50 mL/hr at 11/25/19 0340 New Bag at 11/25/19 0340  . docusate sodium (COLACE) capsule 200 mg  200 mg Oral BID PRN Wyvonnia Dusky, MD      . DULoxetine (CYMBALTA) DR capsule 30 mg  30 mg Oral Daily Wyvonnia Dusky, MD   30 mg at 11/25/19 1324  . ferrous sulfate tablet 325 mg  325 mg Oral BID WC Wyvonnia Dusky, MD   325 mg at 11/25/19 1326  . gabapentin (NEURONTIN) capsule 300 mg  300 mg Oral BID Wyvonnia Dusky, MD   300 mg at 11/25/19 1325  . lisinopril (ZESTRIL) tablet 40 mg  40 mg Oral Daily Wyvonnia Dusky, MD   40 mg at 11/25/19 1325  . morphine 2 MG/ML injection 1 mg  1 mg Intravenous Q4H PRN Wyvonnia Dusky, MD      . ondansetron [facility]) tablet 4 mg  4 mg Oral Q6H PRN Wyvonnia Dusky, MD       Or  . ondansetron [facility]) injection 4 mg  4 mg Intravenous Q6H PRN Wyvonnia Dusky, MD      . pantoprazole (PROTONIX) EC tablet 40  mg  40 mg Oral Daily Wyvonnia Dusky, MD   40 mg at 11/25/19 1326  . traMADol (ULTRAM) tablet 50 mg  50 mg Oral Q6H PRN Wyvonnia Dusky, MD       Current Outpatient Medications  Medication Sig Dispense Refill  . acetaminophen (TYLENOL) 325 MG tablet Take 650 mg by mouth every 6 (six) hours as needed (pain or fever).     Marland Kitchen albuterol (ACCUNEB) 1.25 MG/3ML nebulizer solution Take 3 mLs by nebulization every 6 (six) hours as needed for wheezing or shortness of breath.    Marland Kitchen albuterol (VENTOLIN HFA) 108 (90 Base) MCG/ACT inhaler Inhale 2 puffs into the lungs every 6 (six) hours as needed for shortness of breath or wheezing.    Marland Kitchen aspirin EC 81 MG tablet Take 81 mg by mouth daily.    Marland Kitchen atorvastatin (LIPITOR) 80 MG tablet TAKE 1 TABLET BY  MOUTH EVERYDAY AT BEDTIME (Patient taking differently: Take 80 mg by mouth at bedtime. ) 90 tablet 1  . Black Cohosh 540 MG CAPS Take 1 capsule by mouth daily.    . chlorpheniramine-HYDROcodone (TUSSIONEX) 10-8 MG/5ML SUER Take 5 mLs by mouth every 12 (twelve) hours as needed for cough.    . Cholecalciferol (VITAMIN D3) 5000 units CAPS Take 1 capsule (5,000 Units total) by mouth daily. 30 capsule   . clopidogrel (PLAVIX) 75 MG tablet Take 1 tablet (75 mg total) by mouth daily. 90 tablet 1  . docusate sodium (COLACE) 100 MG capsule Take 100 mg by mouth daily.    . DULoxetine (CYMBALTA) 30 MG capsule TAKE 1 CAPSULE BY MOUTH EVERY DAY (Patient taking differently: Take 30 mg by mouth daily. ) 90 capsule 1  . ferrous sulfate 325 (65 FE) MG tablet Take 325 mg by mouth daily with breakfast.    . gabapentin (NEURONTIN) 300 MG capsule Take 600 mg by mouth 3 (three) times daily.     . insulin glargine (LANTUS) 100 UNIT/ML injection INJECT 50 UNITS INTO THE SKIN DAILY AT 10 PM. (Patient taking differently: Inject 50 Units into the skin at bedtime. ) 20 mL 5  . Insulin Syringe-Needle U-100 (B-D INS SYR ULTRAFINE .3CC/31G) 31G X 5/16" 0.3 ML MISC Use to administer insulin daily to control diabetes. 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 100 each 3  . lisinopril (ZESTRIL) 20 MG tablet Take 20 mg by mouth 2 (two) times daily.    . metFORMIN (GLUCOPHAGE) 1000 MG tablet Take 1,000 mg by mouth 2 (two) times daily.    . ondansetron (ZOFRAN) 8 MG tablet Take 8 mg by mouth every 8 (eight) hours as needed for nausea or vomiting.     . pantoprazole (PROTONIX) 40 MG tablet Take 40 mg by mouth 2 (two) times daily.    Marland Kitchen senna-docusate (SENOKOT-S) 8.6-50 MG tablet Take 1 tablet by mouth at bedtime.     Past Medical History:  Diagnosis Date  . Anemia   . Depression   . Diabetes mellitus without complication ([place])   . GERD (gastroesophageal reflux disease)   . Hyperlipidemia   .  Hypertension   . Legally blind   . Macular degeneration, bilateral   . Peripheral neuropathy   . Post herpetic neuralgia    located in her back  . Stroke [facility])    Past Surgical History:  Procedure Laterality Date  . ABDOMINAL HYSTERECTOMY     partial  . APPENDECTOMY    . BREAST SURGERY     reduction  .  CAROTID PTA/STENT INTERVENTION Left 10/18/2017   Procedure: CAROTID PTA/STENT INTERVENTION;  Surgeon: Algernon Huxley, MD;  Location: [facility];  Service: Cardiovascular;  Laterality: Left;  . CATARACT EXTRACTION, BILATERAL    . CESAREAN SECTION     x3  . COLONOSCOPY    . ESOPHAGEAL DILATION     Social History Social History   Tobacco Use  . Smoking status: Former Smoker    Packs/day: 3.00    Years: 50.00    Pack years: 150.00    Types: Cigarettes    Quit date: 2008    Years since quitting: 13.7  . Smokeless tobacco: Never Used  . Tobacco comment: smoking cessation materials not required  Vaping Use  . Vaping Use: Never used  Substance Use Topics  . Alcohol use: No  . Drug use: No   Family History Family History  Problem Relation Age of Onset  . Healthy Mother   . Diabetes Father   . Alzheimer's disease Father   . Multiple sclerosis Sister   . Multiple sclerosis Brother   Denies family history of peripheral artery disease, venous disease or renal disease.  Allergies  Allergen Reactions  . Adhesive [Tape] Other (See Comments)    "pulls my skin off" paper tape ok  . Lyrica [Pregabalin]     Body spasms   REVIEW OF SYSTEMS (Negative unless checked)  Constitutional: _0 Weight loss  _1 Fever  _2 Chills Cardiac: _3 Chest pain   _4 Chest pressure   _5 Palpitations   _6 Shortness of breath when laying flat   _7 Shortness of breath at rest   _8 Shortness of breath with exertion. Vascular:  _9 Pain in legs with walking   _10 Pain in legs at rest   _11 Pain in legs when laying flat   _12 Claudication   _13 Pain in feet when walking  _14 Pain in feet at rest  _15 Pain in feet when  laying flat   _16 History of DVT   _17 Phlebitis   _18 Swelling in legs   _19 Varicose veins   _20 Non-healing ulcers Pulmonary:   _21 Uses home oxygen   _22 Productive cough   _23 Hemoptysis   _24 Wheeze  _25 COPD   _26 Asthma Neurologic:  _27 Dizziness  _28 Blackouts   _29 Seizures   _30 History of stroke   _31 History of TIA  _32 Aphasia   _33 Temporary blindness   _34 Dysphagia   _35 Weakness or numbness in arms   _36 Weakness or numbness in legs Musculoskeletal:  _37 Arthritis   _38 Joint swelling   _39 Joint pain   _40 Low back pain Hematologic:  _41 Easy bruising  _42 Easy bleeding   _43 Hypercoagulable state   _44 Anemic  _45 Hepatitis Gastrointestinal:  _46 Blood in stool   _47 Vomiting blood  _48 Gastroesophageal reflux/heartburn   _49 Difficulty swallowing. Genitourinary:  _50 Chronic kidney disease   _51 Difficult urination  _52 Frequent urination  _53 Burning with urination   _54 Blood in urine Skin:  _55 Rashes   _56 Ulcers   _57 Wounds Psychological:  _58 History of anxiety   _59  History of major depression.  Physical Examination  Vitals:   11/25/19 0500 11/25/19 0600 11/25/19 1225 11/25/19 1320  BP: (!) 134/54 139/68  123/85  Pulse: 85 82  97  Resp: 19 18  (!) 26  Temp:      TempSrc:      SpO2: 95% 97% 98% 98%  Weight:      Height:       Body mass index is 29.63 kg/m. Gen:  WD/WN, NAD Head: [place]/AT, No temporalis wasting. Prominent temp pulse not noted. Ear/Nose/Throat: Hearing grossly intact, nares w/o erythema or drainage, oropharynx w/o Erythema/Exudate Eyes: Sclera non-icteric, conjunctiva clear  Neck: Trachea midline.  No JVD.  Pulmonary:  Good air movement, respirations not labored, equal bilaterally.  Cardiac: RRR, normal S1, S2. Vascular:  Vessel Right Left  Radial Palpable Palpable  Ulnar Palpable Palpable  Brachial Palpable Palpable  Carotid Palpable, without bruit Palpable, without bruit  Aorta Not palpable N/A  Femoral Palpable Palpable  Popliteal Palpable Palpable  PT Palpable Palpable  DP Palpable Palpable   Gastrointestinal:  soft, non-tender/non-distended. No guarding/reflex.  Musculoskeletal: M/S 5/5 throughout.  Extremities without ischemic changes.  No deformity or atrophy. No edema. Neurologic: Sensation grossly intact in extremities.  Symmetrical.  Speech is fluent. Motor exam as listed above. Psychiatric: Judgment intact, Mood & affect appropriate for pt's clinical situation. Dermatologic: No rashes or ulcers noted.  No cellulitis or open wounds. Lymph : No Cervical, Axillary, or Inguinal lymphadenopathy.  CBC Lab Results  Component Value Date   WBC 8.2 11/25/2019   HGB 11.7 (L) 11/25/2019   HCT 36.2 11/25/2019   MCV 96.3 11/25/2019   PLT 329 11/25/2019   BMET    Component Value Date/Time   NA 136 11/25/2019 0412   NA 140 08/21/2018 1147   K 4.0 11/25/2019 0412   CL 98 11/25/2019 0412   CO2 30 11/25/2019 0412   GLUCOSE 164 (H) 11/25/2019 0412   BUN 25 (H) 11/25/2019 0412   BUN 21 08/21/2018 1147   CREATININE 0.95 11/25/2019 0412   CALCIUM 8.6 (L) 11/25/2019 0412   GFRNONAA 53 (L) 11/25/2019 0412   GFRAA >60 11/25/2019 0412   Estimated Creatinine Clearance: 37 mL/min (by C-G formula based on SCr of 0.95 mg/dL).  COAG Lab Results  Component Value Date   INR 1.0 11/24/2019   INR 1.00 10/17/2017   Radiology CT Head Wo Contrast  Result Date: 11/24/2019 CLINICAL DATA:  Altered mental status, left facial droop and leftward rotation of the head EXAM: CT HEAD WITHOUT CONTRAST TECHNIQUE: Contiguous axial images were obtained from the base of the skull through the vertex without intravenous contrast. COMPARISON:  CT 01/13/2016 FINDINGS: Brain: Scattered punctate hypoattenuating foci in the bilateral basal ganglia and left thalamus likely reflecting areas of prior lacunar type infarct. No evidence of acute infarction, hemorrhage, hydrocephalus, extra-axial collection or mass lesion/mass effect. Basal cisterns are patent. Midline intracranial structures are unremarkable. Cerebellar tonsils are  normally positioned. Symmetric prominence of the ventricles, cisterns and sulci compatible with mild-to-moderate parenchymal volume loss. Patchy and more confluent areas of white matter hypoattenuation are most compatible with stable moderate chronic microvascular angiopathy. Vascular: Atherosclerotic calcification of the carotid siphons and intradural vertebral arteries. No hyperdense vessel. Skull: No calvarial fracture or suspicious osseous lesion. Benign hyperostosis frontalis interna, similar to prior. No scalp swelling or hematoma. Sinuses/Orbits: Few pneumatized secretions present in the right ethmoid air cells. Remaining paranasal sinuses are predominantly clear. Trace right mastoid effusion. Orbital structures are unremarkable aside from prior lens extractions. Other: None. IMPRESSION: 1. No acute intracranial findings. If there is persisting clinical concern for acute infarct, MRI is more sensitive and specific. 2. Stable moderate chronic microvascular angiopathy and parenchymal volume loss. 3. Remote lacunar infarcts in the bilateral basal ganglia and left thalamus. 4. Few pneumatized secretions in the right ethmoid air cells could correlate for clinical features of sinusitis. 5. Trace right mastoid effusion. Electronically Signed   By: Lovena Le M.D.   On: 11/24/2019 16:49   CT ANGIO CHEST PE W OR WO CONTRAST  Result Date: 11/06/2019 CLINICAL DATA:  Cough for 1 week, shortness of breath, acute  respiratory failure with hypoxia, hypertension, diabetes mellitus, history stroke EXAM: CT ANGIOGRAPHY CHEST WITH CONTRAST TECHNIQUE: Multidetector CT imaging of the chest was performed using the standard protocol during bolus administration of intravenous contrast. Multiplanar CT image reconstructions and MIPs were obtained to evaluate the vascular anatomy. CONTRAST:  27m OMNIPAQUE IOHEXOL 350 MG/ML SOLN IV COMPARISON:  None FINDINGS: Cardiovascular: Scattered atherosclerotic calcifications aorta, proximal  great vessels and coronary arteries. Aorta normal caliber. Heart unremarkable. No pericardial effusion. Pulmonary arteries well opacified and patent. No evidence of pulmonary embolism. Mediastinum/Nodes: Few nonspecific large dense calcifications within thyroid lobes without focal mass. Scattered normal sized mediastinal lymph nodes. No thoracic adenopathy. Esophagus unremarkable. Lungs/Pleura: Central peribronchial thickening. Mucous plugging in RIGHT lower lobe. Minimal infiltrate lingula. Slightly nodular appearing peribronchovascular infiltrates are seen throughout the RIGHT lung, minimally in LEFT lung, some of which are tree-in-bud configuration. No dominant mass, pleural effusion or pneumothorax. 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 Distended gallbladder. Small RIGHT renal cyst. Remaining visualized upper abdomen unremarkable. Musculoskeletal: No acute osseous findings. Review of the MIP images confirms the above findings. IMPRESSION: No evidence of pulmonary embolism. Peribronchial thickening with peribronchovascular infiltrates throughout RIGHT lung and minimally in LEFT lung, with minimal mucous plugging in the RIGHT lower lobe. Differential diagnosis would include MAC, panbronchiolitis, respiratory bronchiolitis. 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 Scattered atherosclerotic calcifications including coronary arteries. Aortic Atherosclerosis (ICD10-I70.0). Electronically Signed   By: MLavonia DanaM.D.   On: 11/06/2019 13:03   MR BRAIN WO CONTRAST  Result Date: 11/24/2019 CLINICAL DATA:  Initial evaluation for acute altered mental status, TIA. EXAM: MRI HEAD WITHOUT CONTRAST TECHNIQUE: Multiplanar, multiecho pulse sequences of the brain and surrounding structures were obtained without intravenous contrast. COMPARISON:  Prior head CT from earlier the same day. FINDINGS: Brain: Examination mildly degraded by motion. Diffuse prominence of the CSF containing spaces compatible  generalized age-related cerebral atrophy. Patchy and confluent T2/FLAIR hyperintensity within the periventricular deep white matter both cerebral hemispheres, consistent with chronic microvascular ischemic disease, moderate in nature. Prominent involvement of the pons and thalami noted as well. Few scatter remote lacunar infarcts noted about the basal ganglia and thalami. Area of chronic hemosiderin staining with suggestion of underlying encephalomalacia at the parasagittal right parieto-occipital region, likely reflecting a remote hemorrhagic infarct (series 13, image 31). No abnormal foci of restricted diffusion to suggest acute or subacute ischemia. Gray-white matter differentiation otherwise maintained. No other areas of remote cortical infarction. Punctate chronic microhemorrhage noted within the right cerebellum, likely related to underlying hypertension. No other evidence for acute or chronic intracranial hemorrhage. No mass lesion, midline shift or mass effect. No hydrocephalus or extra-axial fluid collection. Pituitary gland suprasellar region normal. Midline structures intact. Vascular: Loss of normal flow void within the right ICA to the terminus, likely occluded and presumably chronic in nature. Major intracranial vascular flow voids otherwise maintained. Skull and upper cervical spine: Craniocervical junction within normal limits. Bone marrow signal intensity normal. Mild hyperostosis frontalis interna noted. No scalp soft tissue abnormality. Sinuses/Orbits: Patient status post bilateral ocular lens replacement. Mild scattered mucosal thickening noted within the ethmoidal air cells. Paranasal sinuses are otherwise clear. Moderate bilateral mastoid effusions, of doubtful significance. Visualized nasopharynx within normal limits. Other: None. IMPRESSION: 1. No acute intracranial abnormality. 2. Abnormal flow void within the right ICA to the level of the terminus, likely occluded. Finding presumably  chronic in nature. 3. Age-related cerebral atrophy with moderate chronic microvascular ischemic disease, with a few scattered remote  lacunar infarcts about the basal ganglia and thalami. 4. Probable prior hemorrhagic infarct at the parasagittal right parieto-occipital junction. Electronically Signed   By: Jeannine Boga M.D.   On: 11/24/2019 21:33   US Carotid Bilateral  Result Date: 11/25/2019 CLINICAL DATA:  86 year old female with a history carotid artery disease EXAM: BILATERAL CAROTID DUPLEX ULTRASOUND TECHNIQUE: Pearline Cables scale imaging, color Doppler and duplex ultrasound were performed of bilateral carotid and vertebral arteries in the neck. COMPARISON:  None. FINDINGS: Criteria: Quantification of carotid stenosis is based on velocity parameters that correlate the residual internal carotid diameter with NASCET-based stenosis levels, using the diameter of the distal internal carotid lumen as the denominator for stenosis measurement. The following velocity measurements were obtained: RIGHT ICA: Occlusion CCA:  44 cm/sec SYSTOLIC ICA/CCA RATIO:  N/a ECA:  108 cm/sec LEFT ICA:  Systolic 953 cm/sec, Diastolic 43 cm/sec CCA:  69 cm/sec SYSTOLIC ICA/CCA RATIO:  2.0 ECA:  188 cm/sec Right Brachial SBP: Not acquired Left Brachial SBP: Not acquired RIGHT CAROTID ARTERY: Calcification of the right common carotid artery. Intermediate waveform maintained. ICA occlusion. There is minimal arterial signal identified on the duplex images within the ICA, potentially vaso vasorum. RIGHT VERTEBRAL ARTERY: Antegrade flow with low resistance waveform. LEFT CAROTID ARTERY: Calcifications of the left common carotid artery. Intermediate waveform maintained. Moderate heterogeneous and partially calcified plaque at the left carotid bifurcation. No significant lumen shadowing. Low resistance waveform of the left ICA. No significant tortuosity. LEFT VERTEBRAL ARTERY:  Antegrade flow with low resistance waveform. IMPRESSION: Right:  Right ICA occlusion of indeterminate chronicity. Left: Heterogeneous and partially calcified plaque at the left carotid bifurcation contributes to 50%-69% stenosis by established duplex criteria. Signed, Dulcy Fanny. Dellia Nims, RPVI [facility] Electronically Signed   By: Corrie Mckusick D.O.   On: 11/25/2019 07:49   Assessment/Plan The patient is a 86 year old female with a past medical history of hypertension, hyperlipidemia, diabetes, depression, GERD, CVA, with known carotid artery disease / right internal carotid artery occlusion who presents to the emergency department complaining of left-sided weakness.  1.  Carotid artery disease: Patient with known history of right internal carotid artery occlusion status post stenting.  This has been present for the last few years.  We have been monitoring the patient's left internal carotid artery disease on a yearly basis.  Most recent duplex conducted at our office in July 2021, was notable for mild disease 1 to 39%.  Duplex conducted today notes moderate disease (50 to 69%).  In the setting of known occlusion of the right internal carotid artery no intervention is recommended.  Continued surveillance of the left internal carotid artery in the outpatient setting is recommended.  Patient's next appointment is in July 2022.  I will bring the patient back sooner since her duplex today is notable for worsening disease.  The disease in her left internal carotid artery does not warrant any surgical intervention at this time.  Continue patient's home regimen of aspirin, Plavix and statin.  2.  Type 2 diabetes: On appropriate medications Encouraged good control as its slows the progression of atherosclerotic disease  3.  Hyperlipidemia: On aspirin, statin and Plavix Encouraged good control as its slows the progression of atherosclerotic disease  Discussed with Dr. Francene Castle,  PA-C  11/25/2019 3:17 PM  This note was created with Dragon medical transcription system.  Any error is purely unintentional.

## 2019-11-25 NOTE — Progress Notes (Signed)
Bedside swallow eval completed this am. Report to follow. Pt has baseline esophageal dysphagia and occasional cough after liquids. Based on this exam today, will alter diet to Dysphagia 2 with thin liquids. Meds to be given crushed in applesauce instead of with liquids. Strict aspiration precautions. ST to follow up with toleration of diet and adjust as needed.

## 2019-11-25 NOTE — ED Notes (Signed)
Pt lights dimmed for comfort, medicated per MAR. AO x4. Resting with eyes closed, awakens to RN entering room. Pt states she is near blind, deaf in L ear, and can only hear out of R ear. HOH.

## 2019-11-25 NOTE — Evaluation (Signed)
Physical Therapy Evaluation Patient Details Name: Judy Harvey MRN: [id number] DOB: May 10, 1927 Today's Date: 11/25/2019   History of Present Illness  86 y/o F w/ PMH of HTN, HLD, DM2, depression, IDA, GERD, likely esophageal stenosis s/p multiple dilatations, CVA, carotid artery stenosis s/p stent placement who presents w/ left sided weakness for unknown duration. All of the hx was obtain from pt who is a poor historian. The left sided weakness has since resolved. MRI negative for new CVA.  Clinical Impression  Patient received in bed, agrees to PT assessment. She is HOH. Patient has slight weakness on left side UE & LE compared to right. She is able to sit up on side of bed with min assist. Sit to stand with min guard. She took a few steps forward and back in room with min guard and RW. She is limited by lines at this session. Patient will continue benefit from skilled PT while here to improve strength and functional independence. I am hopeful we will be able to assess more walking next session and she will be able to go home with HHPT at discharge.       Follow Up Recommendations Home health PT    Equipment Recommendations  None recommended by PT;Other (comment) (TBD)    Recommendations for Other Services       Precautions / Restrictions Precautions Precautions: Fall Restrictions Weight Bearing Restrictions: No      Mobility  Bed Mobility Overal bed mobility: Needs Assistance Bed Mobility: Supine to Sit;Sit to Supine     Supine to sit: Min assist Sit to supine: Min assist   General bed mobility comments: min assist to raise trunk to seated position. Min assist to return to supine and get positioned in bed.  Transfers Overall transfer level: Needs assistance Equipment used: Rolling walker (2 wheeled) Transfers: Sit to/from Stand Sit to Stand: Min guard            Ambulation/Gait Ambulation/Gait assistance: Min guard Gait Distance (Feet): 4 Feet Assistive  device: Rolling walker (2 wheeled) Gait Pattern/deviations: Step-through pattern;Decreased stride length;Shuffle Gait velocity: decreased   General Gait Details: patient only able to walk 4 feet forwad and back due to IV line attached to stretcher. RN not available to disconnect.  Stairs            Wheelchair Mobility    Modified Rankin (Stroke Patients Only)       Balance Overall balance assessment: Needs assistance Sitting-balance support: Feet supported;Single extremity supported Sitting balance-Leahy Scale: Fair Sitting balance - Comments: Leaning to her right in sitting. ( may have just been due to positioning of stretcher ) Postural control: Right lateral lean Standing balance support: Bilateral upper extremity supported;During functional activity Standing balance-Leahy Scale: Fair Standing balance comment: reliant on B UE support for safety                             Pertinent Vitals/Pain Pain Assessment: No/denies pain    Home Living Family/patient expects to be discharged to:: Private residence Living Arrangements: Children Available Help at Discharge: Family;Available 24 hours/day Type of Home: House Home Access: Stairs to enter   [employer] of Steps: 1 Home Layout: One level Home Equipment: None Additional Comments: states she cannot use walker in her home as it is too tight. Steadies herself on furniture    Prior Function Level of Independence: Independent  Hand Dominance        Extremity/Trunk Assessment   Upper Extremity Assessment Upper Extremity Assessment: Defer to OT evaluation    Lower Extremity Assessment Lower Extremity Assessment: LLE deficits/detail LLE Deficits / Details: left LE slightly weaker than right LLE Sensation: decreased light touch    Cervical / Trunk Assessment Cervical / Trunk Assessment: Normal  Communication   Communication: HOH  Cognition Arousal/Alertness:  Awake/alert Behavior During Therapy: WFL for tasks assessed/performed Overall Cognitive Status: Within Functional Limits for tasks assessed                                        General Comments      Exercises     Assessment/Plan    PT Assessment Patient needs continued PT services  PT Problem List Decreased strength;Decreased mobility;Decreased safety awareness;Decreased activity tolerance;Decreased balance;Decreased knowledge of use of DME;Cardiopulmonary status limiting activity       PT Treatment Interventions DME instruction;Therapeutic activities;Gait training;Therapeutic exercise;Functional mobility training;Balance training;Neuromuscular re-education;Patient/family education    PT Goals (Current goals can be found in the Care Plan section)  Acute Rehab PT Goals Patient Stated Goal: patient willing to go to rehab if needed PT Goal Formulation: With patient Time For Goal Achievement: 12/09/19 Potential to Achieve Goals: Good    Frequency Min 2X/week   Barriers to discharge Decreased caregiver support lives with son and his girlfriend but I dont think he assists much with her needs.    Co-evaluation               AM-PAC PT "6 Clicks" Mobility  Outcome Measure Help needed turning from your back to your side while in a flat bed without using bedrails?: A Little Help needed moving from lying on your back to sitting on the side of a flat bed without using bedrails?: A Little Help needed moving to and from a bed to a chair (including a wheelchair)?: A Little Help needed standing up from a chair using your arms (e.g., wheelchair or bedside chair)?: A Little Help needed to walk in hospital room?: A Little Help needed climbing 3-5 steps with a railing? : A Lot 6 Click Score: 17    End of Session Equipment Utilized During Treatment: Gait belt;Oxygen Activity Tolerance: Patient tolerated treatment well Patient left: in bed;with call bell/phone within  reach Nurse Communication: Mobility status PT Visit Diagnosis: Unsteadiness on feet (R26.81);Muscle weakness (generalized) (M62.81);Other abnormalities of gait and mobility (R26.89);History of falling (Z91.81);Difficulty in walking, not elsewhere classified (R26.2)    Time: 3532-9924 PT Time Calculation (min) (ACUTE ONLY): 30 min   Charges:   PT Evaluation $PT Eval Moderate Complexity: 1 Mod PT Treatments $Therapeutic Activity: 8-22 mins        Dnya Hickle, PT, GCS 11/25/19,12:38 PM

## 2019-11-25 NOTE — Evaluation (Signed)
Occupational Therapy Evaluation Patient Details Name: Judy Harvey MRN: [id number] DOB: Jul 04, 1927 Today's Date: 11/25/2019    History of Present Illness 86 y/o F w/ PMH of HTN, HLD, DM2, depression, IDA, GERD, likely esophageal stenosis s/p multiple dilatations, CVA, carotid artery stenosis s/p stent placement who presents w/ left sided weakness for unknown duration. All of the hx was obtain from pt who is a poor historian. The left sided weakness has since resolved. MRI negative for new CVA.   Clinical Impression   Pt was seen for OT evaluation this date. Prior to hospital admission, pt was MID I with fxl mobility and Indep with self care ADLs. Pt lives in [facility] with 1 STE. Currently pt demonstrates impairments as described below (See OT problem list) which functionally limit her ability to perform ADL/self-care tasks. Pt currently requires MOD A with LB bathing/dressing and MIN A with UB as well as MIN A to CGA with ADL transfers with hand-held assist for UE support/balance.  Pt would benefit from skilled OT to address noted impairments and functional limitations (see below for any additional details) in order to maximize safety and independence while minimizing falls risk and caregiver burden. Upon hospital discharge, recommend HHOT and SUPV to maximize pt safety and return to functional independence during meaningful occupations of daily life.     Follow Up Recommendations  Home health OT;Supervision - Intermittent (supv for all OOB/OOC/fxl mobility)    Equipment Recommendations  3 in 1 bedside commode    Recommendations for Other Services       Precautions / Restrictions Precautions Precautions: Fall Restrictions Weight Bearing Restrictions: No      Mobility Bed Mobility Overal bed mobility: Needs Assistance Bed Mobility: Supine to Sit;Sit to Supine     Supine to sit: Min assist Sit to supine: Min assist      Transfers Overall transfer level: Needs  assistance Equipment used: 1 person hand held assist Transfers: Sit to/from Stand Sit to Stand: Min guard;Min assist              Balance Overall balance assessment: Needs assistance Sitting-balance support: Feet supported;Single extremity supported Sitting balance-Leahy Scale: Fair Sitting balance - Comments: leaning to R, could be 2/2 stretcher positioning because once seated EOB on regular hospital bed, pt is more stable. Postural control: Right lateral lean Standing balance support: Bilateral upper extremity supported;During functional activity Standing balance-Leahy Scale: Fair Standing balance comment: Requires UE support to stabilize in static stand                           ADL either performed or assessed with clinical judgement   ADL Overall ADL's : Needs assistance/impaired                                       General ADL Comments: MIN A for seated UB bathing/dressing d/t line/lead mgt and setup with wash cloths. MOD A for LB ADLs using sit<>stand. Pt requires MIN A/CGA with ADL transfers with HHA.     Vision Patient Visual Report: No change from baseline       Perception     Praxis      Pertinent Vitals/Pain Pain Assessment: No/denies pain     Hand Dominance     Extremity/Trunk Assessment Upper Extremity Assessment Upper Extremity Assessment:  (very minimally notable weakness to L UE versus R UE.  L UE grossly 4-/5, R UE grossly 4/5)   Lower Extremity Assessment Lower Extremity Assessment: Defer to PT evaluation       Communication Communication Communication: HOH   Cognition Arousal/Alertness: Awake/alert Behavior During Therapy: WFL for tasks assessed/performed Overall Cognitive Status: Within Functional Limits for tasks assessed                                     General Comments       Exercises Other Exercises Other Exercises: OT facilitates ed re: role of OT in acute setting, safety  considerations, fall prevention, use of call light, importance of clean/dry skin to preserve skin integrity. Pt with good understanding.   Shoulder Instructions      Home Living Family/patient expects to be discharged to:: Private residence Living Arrangements: Children   Type of Home: House Home Access: Stairs to enter Secretary/administrator of Steps: 1   Home Layout: One level               Home Equipment: None   Additional Comments: states she cannot use walker in her home as it is too tight. Steadies herself on furniture      Prior Functioning/Environment Level of Independence: Independent                 OT Problem List: Decreased strength;Decreased range of motion;Decreased activity tolerance;Impaired balance (sitting and/or standing);Decreased coordination;Decreased knowledge of use of DME or AE      OT Treatment/Interventions: Self-care/ADL training;Therapeutic exercise;Energy conservation;DME and/or AE instruction;Therapeutic activities;Patient/family education;Balance training    OT Goals(Current goals can be found in the care plan section) Acute Rehab OT Goals Patient Stated Goal: to get stronger and get independence back OT Goal Formulation: With patient Time For Goal Achievement: 12/09/19 Potential to Achieve Goals: Good ADL Goals Pt Will Perform Grooming: with supervision;standing Pt Will Perform Lower Body Dressing: with min guard assist;with min assist;sit to/from stand Pt Will Transfer to Toilet: with min guard assist;ambulating;grab bars (to BSC over standard commode and with LRAD to amb) Pt Will Perform Toileting - Clothing Manipulation and hygiene: with min guard assist;sit to/from stand Pt/caregiver will Perform Home Exercise Program: Increased strength;Both right and left upper extremity;With Supervision  OT Frequency: Min 1X/week   Barriers to D/C:            Co-evaluation              AM-PAC OT "6 Clicks" Daily Activity      Outcome Measure Help from another person eating meals?: None Help from another person taking care of personal grooming?: A Little Help from another person toileting, which includes using toliet, bedpan, or urinal?: A Little Help from another person bathing (including washing, rinsing, drying)?: A Lot Help from another person to put on and taking off regular upper body clothing?: None Help from another person to put on and taking off regular lower body clothing?: A Lot 6 Click Score: 18   End of Session Equipment Utilized During Treatment: Gait belt;Oxygen Nurse Communication: Mobility status  Activity Tolerance: Patient tolerated treatment well Patient left: in bed;with call bell/phone within reach;with bed alarm set  OT Visit Diagnosis: Unsteadiness on feet (R26.81);Muscle weakness (generalized) (M62.81);History of falling (Z91.81)                Time: 1443-1540 OT Time Calculation (min): 57 min Charges:  OT General Charges $OT Visit: 1 Visit OT Evaluation $  OT Eval Moderate Complexity: 1 Mod OT Treatments $Self Care/Home Management : 23-37 mins $Therapeutic Activity: 8-22 mins    Rejeana Brock, MS, OTR/L ascom [phone] 11/25/19, 5:08 PM

## 2019-11-25 NOTE — Evaluation (Signed)
Clinical/Bedside Swallow Evaluation Patient Details  Name: NADALYN DERINGER MRN: [id number] Date of Birth: 1927/06/30  Today's Date: 11/25/2019 Time: SLP Start Time (ACUTE ONLY): 1045 SLP Stop Time (ACUTE ONLY): 1130 SLP Time Calculation (min) (ACUTE ONLY): 45 min  Past Medical History:  Past Medical History:  Diagnosis Date   Anemia    Depression    Diabetes mellitus without complication (HCC)    GERD (gastroesophageal reflux disease)    Hyperlipidemia    Hypertension    Legally blind    Macular degeneration, bilateral    Peripheral neuropathy    Post herpetic neuralgia    located in her back   Stroke [facility])    Past Surgical History:  Past Surgical History:  Procedure Laterality Date   ABDOMINAL HYSTERECTOMY     partial   APPENDECTOMY     BREAST SURGERY     reduction   CAROTID PTA/STENT INTERVENTION Left 10/18/2017   Procedure: CAROTID PTA/STENT INTERVENTION;  Surgeon: Annice Needy, MD;  Location: [facility] INVASIVE CV LAB;  Service: Cardiovascular;  Laterality: Left;   CATARACT EXTRACTION, BILATERAL     CESAREAN SECTION     x3   COLONOSCOPY     ESOPHAGEAL DILATION     HPI:  Per admitting H&P: ": 86 y/o F w/ PMH of HTN, HLD, DM2, depression, IDA, GERD, likely esophageal stenosis s/p multiple dilatations, CVA, carotid artery stenosis s/p stent placement who presents w/ left sided weakness for unknown duration. All of the hx was obtain from pt who is a poor historian. The left sided weakness has since resolved. Pt denies any difficulty speaking but unsure if she has any trouble walking. Of note, pt has had CVAs in the past with the last one being a couple of years ago. Pt denies any fevers, chills, sweating, cough, chest pain, shortness of breath, nausea, vomiting, abd pain, dysuria, urinary urgency, diarrhea, or constipation.    Assessment / Plan / Recommendation Clinical Impression  Bedside swallow eval today revealed mild dysphagia. Pt reports a history of  esophageal dysphagia and also with an occasional cough while eating and drinking. Pt reports she does fine but needs to eat and drink slowly. Today, oral mech exam revealed structures to be functioning adequately with no apparent weakness. Occasional single cough after several single sips of thin liquids. Pt tolerated applesauce well but needed extended time to masticate solid boluses as her partials are at home. Min to no oral residue after the swallow. Vocal quality remained clear throughout assessment. Rec Dysphagia 2 diet with thin liquids. Meds crushed in applesauce. Pt reports she always eats slowly and that as long as she does this, she swallows fine. ST to follow up with toleration of diet and alter if needed or proceed with instrumental testing. Prognosis good SLP Visit Diagnosis: Dysphagia, oropharyngeal phase (R13.12)    Aspiration Risk  Mild aspiration risk    Diet Recommendation Dysphagia 2 (Fine chop)   Liquid Administration via: Straw Medication Administration: Crushed with puree Supervision: Staff to assist with self feeding;Intermittent supervision to cue for compensatory strategies Compensations: Small sips/bites;Slow rate Postural Changes: Seated upright at 90 degrees    Other  Recommendations     Follow up Recommendations   ST to follow up with toleration of diet     Frequency and Duration min 2x/week  1 week       Prognosis Prognosis for Safe Diet Advancement: Good      Swallow Study   General Date of Onset: 11/24/19 HPI:  Per admitting H&P: ": 86 y/o F w/ PMH of HTN, HLD, DM2, depression, IDA, GERD, likely esophageal stenosis s/p multiple dilatations, CVA, carotid artery stenosis s/p stent placement who presents w/ left sided weakness for unknown duration. All of the hx was obtain from pt who is a poor historian. The left sided weakness has since resolved. Pt denies any difficulty speaking but unsure if she has any trouble walking. Of note, pt has had CVAs in the  past with the last one being a couple of years ago. Pt denies any fevers, chills, sweating, cough, chest pain, shortness of breath, nausea, vomiting, abd pain, dysuria, urinary urgency, diarrhea, or constipation.  Type of Study: Bedside Swallow Evaluation Diet Prior to this Study: NPO Temperature Spikes Noted: No Respiratory Status: Nasal cannula History of Recent Intubation: No Behavior/Cognition: Alert;Cooperative;Pleasant mood Oral Cavity Assessment: Within Functional Limits Oral Cavity - Dentition: Missing dentition;Other (Comment) (Reports she has partials at home) Vision: Impaired for self-feeding Self-Feeding Abilities: Needs set up;Needs assist Patient Positioning: Upright in bed Baseline Vocal Quality: Normal    Oral/Motor/Sensory Function Overall Oral Motor/Sensory Function: Within functional limits   Ice Chips Ice chips: Within functional limits   Thin Liquid Thin Liquid: Within functional limits Presentation: Cup;Self Fed;Spoon;Straw    Nectar Thick Nectar Thick Liquid: Not tested   Honey Thick Honey Thick Liquid: Not tested   Puree Puree: Within functional limits Presentation: Spoon   Solid     Solid: Impaired Presentation: Self Fed Oral Phase Impairments: Impaired mastication Oral Phase Functional Implications: Prolonged oral transit;Impaired mastication;Oral residue      Eather Colas 11/25/2019,12:27 PM

## 2019-11-25 NOTE — ED Notes (Signed)
Pt BG 43 mg/dL, L49 pushed via IV. Provider Jon Billings made aware via secure chat. Awaiting provider orders. Pt remains mentation, AO x4. Pt given apple juice to drink as well and started coughing. All fluids taken from patient and NPO statu initiated, provider made aware.

## 2019-11-25 NOTE — ED Notes (Signed)
Pt failed ambulatory trial on pulse ox monitor, desat to 86% on RA. Pt ambulated with walker at baseline per son. Pt reporting slight sob with ambulating. Pt improved to 93% upon returning to bed. Placed on 2L [place]. Pt son made aware for plan of admission. Pt now has productive cough

## 2019-11-26 DIAGNOSIS — I6523 Occlusion and stenosis of bilateral carotid arteries: Secondary | ICD-10-CM

## 2019-11-26 LAB — CBC
HCT: 33.6 % — ABNORMAL LOW (ref 36.0–46.0)
Hemoglobin: 11.3 g/dL — ABNORMAL LOW (ref 12.0–15.0)
MCH: 31.8 pg (ref 26.0–34.0)
MCHC: 33.6 g/dL (ref 30.0–36.0)
MCV: 94.6 fL (ref 80.0–100.0)
Platelets: 305 10*3/uL (ref 150–400)
RBC: 3.55 MIL/uL — ABNORMAL LOW (ref 3.87–5.11)
RDW: 14.3 % (ref 11.5–15.5)
WBC: 6.7 10*3/uL (ref 4.0–10.5)
nRBC: 0 % (ref 0.0–0.2)

## 2019-11-26 LAB — BASIC METABOLIC PANEL
Anion gap: 8 (ref 5–15)
BUN: 24 mg/dL — ABNORMAL HIGH (ref 8–23)
CO2: 30 mmol/L (ref 22–32)
Calcium: 8.4 mg/dL — ABNORMAL LOW (ref 8.9–10.3)
Chloride: 99 mmol/L (ref 98–111)
Creatinine, Ser: 1.02 mg/dL — ABNORMAL HIGH (ref 0.44–1.00)
GFR calc Af Amer: 56 mL/min — ABNORMAL LOW (ref >60)
GFR calc non Af Amer: 48 mL/min — ABNORMAL LOW (ref >60)
Glucose, Bld: 152 mg/dL — ABNORMAL HIGH (ref 70–99)
Potassium: 4.5 mmol/L (ref 3.5–5.1)
Sodium: 137 mmol/L (ref 135–145)

## 2019-11-26 LAB — GLUCOSE, CAPILLARY
Glucose-Capillary: 221 mg/dL — ABNORMAL HIGH (ref 70–99)
Glucose-Capillary: 134 mg/dL — ABNORMAL HIGH (ref 70–99)
Glucose-Capillary: 140 mg/dL — ABNORMAL HIGH (ref 70–99)
Glucose-Capillary: 198 mg/dL — ABNORMAL HIGH (ref 70–99)

## 2019-11-26 NOTE — Progress Notes (Signed)
Discharge Note: Reviewed discharge instructions with son, Loraine Leriche. Son verbalized understanding. Iv intact upon remove. Obtained vitals. Pt discharging with personal belongings.  Staff wheeled pt out. Son transported pt to home via private vehicle.

## 2019-11-26 NOTE — Plan of Care (Signed)
Care plan reviewed with the patient. 

## 2019-11-26 NOTE — Discharge Summary (Signed)
Physician Discharge Summary  RAYEN PALEN ZOX:[id number] DOB: 09-27-1927 DOA: 11/24/2019  PCP: Leonel Ramsay, MD  Admit date: 11/24/2019 Discharge date: 11/26/2019  Admitted From: home  Disposition:  Home w/ home health   Recommendations for Outpatient Follow-up:  1. Follow up with PCP in 1-2 weeks 2. F/u vascular surg in Jan 2020 3. F/u neuro in 1 week   Home Health: yes Equipment/Devices:  Discharge Condition: stable  CODE STATUS: DNR Diet recommendation: Heart Healthy / Carb Modified    Brief/Interim Summary: 86 y/o F w/ PMH of HTN, HLD, DM2, depression, IDA, GERD, likely esophageal stenosis s/p multiple dilatations, CVA, carotid artery stenosis s/p stent placement who presents w/ left sided weakness for unknown duration. All of the hx was obtain from pt who is a poor historian. The left sided weakness has since resolved. Pt denies any difficulty speaking but unsure if she has any trouble walking. Of note, pt has had CVAs in the past with the last one being a couple of years ago. Pt denies any fevers, chills, sweating, cough, chest pain, shortness of breath, nausea, vomiting, abd pain, dysuria, urinary urgency, diarrhea, or constipation.   Pt was admitted w/ left sided weakness that resolved on its own. Pt was thought to have a TIA and was continue on aspirin, plavix & statin. MRI brain and CT brain did not show any acute intracranial findings but only remote lacunar infarcts. Of note, pt has hx of carotid stent and was found to have worsening carotid artery stenosis. Vascular surg saw the pt and no acute surgery was needed but pt will f/u outpatient w/ vascular surg in Jan 2022. Also, PT/OT saw the pt and recommended home health. Home health was set by CM prior to d/c.  Discharge Diagnoses:  Active Problems:   TIA (transient ischemic attack) Left sided weakness: that since resolved. Etiology unclear, possible TIA vs CVA. CT brain shows no acute intracranial findings but  remote lacunar infarcts of b/l basal ganglia & left thalamus. MRI brain shows no acute intracranial abnormality & remote lacunar infarcts & probable prior hemorrhagic infarct. Will continue on home dose of aspirin, plavix & statin. Continue on dysphagia as per speech. PT recs home health.   Hx of carotid stent: [place] of carotid shows right ICA occlusion unknown chronicity & left carotid shows 50-69% stenosis. Vascular surg consulted   Hx of esophageal stenosis: s/p multiple dilations. Continue on dysphagia diet as per speech   HTN: will restart home dose of lisinopril   HLD: will continue on statin   DM2: will hold home dose of metformin. Will continue on SSI w/ accuchecks  Depression: severity unknown. Will continue on home dose of duloxetine  Peripheral neuropathy: will continue on home dose of gabapentin  GERD: will continue on PPI   Discharge Instructions  Discharge Instructions    Diet - low sodium heart healthy   Complete by: As directed    Diet Carb Modified   Complete by: As directed    Discharge instructions   Complete by: As directed    F/u PCP in 1 week. F/u neuro in 1-2 weeks.   Increase activity slowly   Complete by: As directed      Allergies as of 11/26/2019      Reactions   Adhesive [tape] Other (See Comments)   "pulls my skin off" paper tape ok   Lyrica [pregabalin]    Body spasms      Medication List    TAKE these medications  albuterol 108 (90 Base) MCG/ACT inhaler Commonly known as: VENTOLIN HFA Inhale 2 puffs into the lungs every 6 (six) hours as needed for shortness of breath or wheezing.   albuterol 1.25 MG/3ML nebulizer solution Commonly known as: ACCUNEB Take 3 mLs by nebulization every 6 (six) hours as needed for wheezing or shortness of breath.   aspirin EC 81 MG tablet Take 81 mg by mouth daily.   atorvastatin 80 MG tablet Commonly known as: LIPITOR TAKE 1 TABLET BY MOUTH EVERYDAY AT BEDTIME What changed:   how much to  take  how to take this  when to take this  additional instructions   Black Cohosh 540 MG Caps Take 1 capsule by mouth daily.   chlorpheniramine-HYDROcodone 10-8 MG/5ML Suer Commonly known as: TUSSIONEX Take 5 mLs by mouth every 12 (twelve) hours as needed for cough.   clopidogrel 75 MG tablet Commonly known as: PLAVIX Take 1 tablet (75 mg total) by mouth daily.   docusate sodium 100 MG capsule Commonly known as: COLACE Take 100 mg by mouth daily.   DULoxetine 30 MG capsule Commonly known as: CYMBALTA TAKE 1 CAPSULE BY MOUTH EVERY DAY What changed:   how much to take  how to take this  when to take this  additional instructions   ferrous sulfate 325 (65 FE) MG tablet Take 325 mg by mouth daily with breakfast.   gabapentin 300 MG capsule Commonly known as: NEURONTIN Take 600 mg by mouth 3 (three) times daily.   insulin glargine 100 UNIT/ML injection Commonly known as: Lantus INJECT 50 UNITS INTO THE SKIN DAILY AT 10 PM. What changed:   how much to take  how to take this  when to take this  additional instructions   Insulin Syringe-Needle U-100 31G X 5/16" 0.3 ML Misc Commonly known as: B-D INS SYR ULTRAFINE .3CC/31G Use to administer insulin daily to control diabetes. 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   lisinopril 20 MG tablet Commonly known as: ZESTRIL Take 20 mg by mouth 2 (two) times daily.   metFORMIN 1000 MG tablet Commonly known as: GLUCOPHAGE Take 1,000 mg by mouth 2 (two) times daily.   ondansetron 8 MG tablet Commonly known as: ZOFRAN Take 8 mg by mouth every 8 (eight) hours as needed for nausea or vomiting.   pantoprazole 40 MG tablet Commonly known as: PROTONIX Take 40 mg by mouth 2 (two) times daily.   senna-docusate 8.6-50 MG tablet Commonly known as: Senokot-S Take 1 tablet by mouth at bedtime.   Tylenol 325 MG tablet Generic drug: acetaminophen Take 650 mg by mouth every 6 (six) hours  as needed (pain or fever).   Vitamin D3 125 MCG (5000 UT) Caps Take 1 capsule (5,000 Units total) by mouth daily.       Follow-up Information    Dew, Erskine Squibb, MD Follow up.   Specialties: Vascular Surgery, Radiology, Interventional Cardiology Why: Can see Dew or Arna Medici. Will need an appointment in January 2022. Patient will need carotid duplex with visit.  Contact information: [place] [address] [phone]              Allergies  Allergen Reactions  . Adhesive [Tape] Other (See Comments)    "pulls my skin off" paper tape ok  . Lyrica [Pregabalin]     Body spasms    Consultations:  Vascular surg    Procedures/Studies: CT Head Wo Contrast  Result Date: 11/24/2019 CLINICAL DATA:  Altered mental status, left  facial droop and leftward rotation of the head EXAM: CT HEAD WITHOUT CONTRAST TECHNIQUE: Contiguous axial images were obtained from the base of the skull through the vertex without intravenous contrast. COMPARISON:  CT 01/13/2016 FINDINGS: Brain: Scattered punctate hypoattenuating foci in the bilateral basal ganglia and left thalamus likely reflecting areas of prior lacunar type infarct. No evidence of acute infarction, hemorrhage, hydrocephalus, extra-axial collection or mass lesion/mass effect. Basal cisterns are patent. Midline intracranial structures are unremarkable. Cerebellar tonsils are normally positioned. Symmetric prominence of the ventricles, cisterns and sulci compatible with mild-to-moderate parenchymal volume loss. Patchy and more confluent areas of white matter hypoattenuation are most compatible with stable moderate chronic microvascular angiopathy. Vascular: Atherosclerotic calcification of the carotid siphons and intradural vertebral arteries. No hyperdense vessel. Skull: No calvarial fracture or suspicious osseous lesion. Benign hyperostosis frontalis interna, similar to prior. No scalp swelling or hematoma. Sinuses/Orbits: Few  pneumatized secretions present in the right ethmoid air cells. Remaining paranasal sinuses are predominantly clear. Trace right mastoid effusion. Orbital structures are unremarkable aside from prior lens extractions. Other: None. IMPRESSION: 1. No acute intracranial findings. If there is persisting clinical concern for acute infarct, MRI is more sensitive and specific. 2. Stable moderate chronic microvascular angiopathy and parenchymal volume loss. 3. Remote lacunar infarcts in the bilateral basal ganglia and left thalamus. 4. Few pneumatized secretions in the right ethmoid air cells could correlate for clinical features of sinusitis. 5. Trace right mastoid effusion. Electronically Signed   By: Lovena Le M.D.   On: 11/24/2019 16:49   CT ANGIO CHEST PE W OR WO CONTRAST  Result Date: 11/06/2019 CLINICAL DATA:  Cough for 1 week, shortness of breath, acute respiratory failure with hypoxia, hypertension, diabetes mellitus, history stroke EXAM: CT ANGIOGRAPHY CHEST WITH CONTRAST TECHNIQUE: Multidetector CT imaging of the chest was performed using the standard protocol during bolus administration of intravenous contrast. Multiplanar CT image reconstructions and MIPs were obtained to evaluate the vascular anatomy. CONTRAST:  75m OMNIPAQUE IOHEXOL 350 MG/ML SOLN IV COMPARISON:  None FINDINGS: Cardiovascular: Scattered atherosclerotic calcifications aorta, proximal great vessels and coronary arteries. Aorta normal caliber. Heart unremarkable. No pericardial effusion. Pulmonary arteries well opacified and patent. No evidence of pulmonary embolism. Mediastinum/Nodes: Few nonspecific large dense calcifications within thyroid lobes without focal mass. Scattered normal sized mediastinal lymph nodes. No thoracic adenopathy. Esophagus unremarkable. Lungs/Pleura: Central peribronchial thickening. Mucous plugging in RIGHT lower lobe. Minimal infiltrate lingula. Slightly nodular appearing peribronchovascular infiltrates are  seen throughout the RIGHT lung, minimally in LEFT lung, some of which are tree-in-bud configuration. No dominant mass, pleural effusion or pneumothorax. 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 Distended gallbladder. Small RIGHT renal cyst. Remaining visualized upper abdomen unremarkable. Musculoskeletal: No acute osseous findings. Review of the MIP images confirms the above findings. IMPRESSION: No evidence of pulmonary embolism. Peribronchial thickening with peribronchovascular infiltrates throughout RIGHT lung and minimally in LEFT lung, with minimal mucous plugging in the RIGHT lower lobe. Differential diagnosis would include MAC, panbronchiolitis, respiratory bronchiolitis. 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 Scattered atherosclerotic calcifications including coronary arteries. Aortic Atherosclerosis (ICD10-I70.0). Electronically Signed   By: MLavonia DanaM.D.   On: 11/06/2019 13:03   MR BRAIN WO CONTRAST  Result Date: 11/24/2019 CLINICAL DATA:  Initial evaluation for acute altered mental status, TIA. EXAM: MRI HEAD WITHOUT CONTRAST TECHNIQUE: Multiplanar, multiecho pulse sequences of the brain and surrounding structures were obtained without intravenous contrast. COMPARISON:  Prior head CT from earlier the same day. FINDINGS: Brain: Examination mildly  degraded by motion. Diffuse prominence of the CSF containing spaces compatible generalized age-related cerebral atrophy. Patchy and confluent T2/FLAIR hyperintensity within the periventricular deep white matter both cerebral hemispheres, consistent with chronic microvascular ischemic disease, moderate in nature. Prominent involvement of the pons and thalami noted as well. Few scatter remote lacunar infarcts noted about the basal ganglia and thalami. Area of chronic hemosiderin staining with suggestion of underlying encephalomalacia at the parasagittal right parieto-occipital region, likely reflecting a remote hemorrhagic infarct  (series 13, image 31). No abnormal foci of restricted diffusion to suggest acute or subacute ischemia. Gray-white matter differentiation otherwise maintained. No other areas of remote cortical infarction. Punctate chronic microhemorrhage noted within the right cerebellum, likely related to underlying hypertension. No other evidence for acute or chronic intracranial hemorrhage. No mass lesion, midline shift or mass effect. No hydrocephalus or extra-axial fluid collection. Pituitary gland suprasellar region normal. Midline structures intact. Vascular: Loss of normal flow void within the right ICA to the terminus, likely occluded and presumably chronic in nature. Major intracranial vascular flow voids otherwise maintained. Skull and upper cervical spine: Craniocervical junction within normal limits. Bone marrow signal intensity normal. Mild hyperostosis frontalis interna noted. No scalp soft tissue abnormality. Sinuses/Orbits: Patient status post bilateral ocular lens replacement. Mild scattered mucosal thickening noted within the ethmoidal air cells. Paranasal sinuses are otherwise clear. Moderate bilateral mastoid effusions, of doubtful significance. Visualized nasopharynx within normal limits. Other: None. IMPRESSION: 1. No acute intracranial abnormality. 2. Abnormal flow void within the right ICA to the level of the terminus, likely occluded. Finding presumably chronic in nature. 3. Age-related cerebral atrophy with moderate chronic microvascular ischemic disease, with a few scattered remote lacunar infarcts about the basal ganglia and thalami. 4. Probable prior hemorrhagic infarct at the parasagittal right parieto-occipital junction. Electronically Signed   By: Jeannine Boga M.D.   On: 11/24/2019 21:33   US Carotid Bilateral  Result Date: 11/25/2019 CLINICAL DATA:  86 year old female with a history carotid artery disease EXAM: BILATERAL CAROTID DUPLEX ULTRASOUND TECHNIQUE: Pearline Cables scale imaging, color  Doppler and duplex ultrasound were performed of bilateral carotid and vertebral arteries in the neck. COMPARISON:  None. FINDINGS: Criteria: Quantification of carotid stenosis is based on velocity parameters that correlate the residual internal carotid diameter with NASCET-based stenosis levels, using the diameter of the distal internal carotid lumen as the denominator for stenosis measurement. The following velocity measurements were obtained: RIGHT ICA: Occlusion CCA:  44 cm/sec SYSTOLIC ICA/CCA RATIO:  N/a ECA:  108 cm/sec LEFT ICA:  Systolic 409 cm/sec, Diastolic 43 cm/sec CCA:  69 cm/sec SYSTOLIC ICA/CCA RATIO:  2.0 ECA:  188 cm/sec Right Brachial SBP: Not acquired Left Brachial SBP: Not acquired RIGHT CAROTID ARTERY: Calcification of the right common carotid artery. Intermediate waveform maintained. ICA occlusion. There is minimal arterial signal identified on the duplex images within the ICA, potentially vaso vasorum. RIGHT VERTEBRAL ARTERY: Antegrade flow with low resistance waveform. LEFT CAROTID ARTERY: Calcifications of the left common carotid artery. Intermediate waveform maintained. Moderate heterogeneous and partially calcified plaque at the left carotid bifurcation. No significant lumen shadowing. Low resistance waveform of the left ICA. No significant tortuosity. LEFT VERTEBRAL ARTERY:  Antegrade flow with low resistance waveform. IMPRESSION: Right: Right ICA occlusion of indeterminate chronicity. Left: Heterogeneous and partially calcified plaque at the left carotid bifurcation contributes to 50%-69% stenosis by established duplex criteria. Signed, Dulcy Fanny. Dellia Nims, RPVI [facility] Electronically Signed   By: Corrie Mckusick D.O.   On: 11/25/2019  07:49       Subjective: Pt c/o fatigue    Discharge Exam: Vitals:   11/25/19 2023 11/26/19 0741  BP: 101/80 (!) 118/52  Pulse: 85 79  Resp: 19 19  Temp: 98.2 F (36.8 C) 98.1 F  (36.7 C)  SpO2: 98% 99%   Vitals:   11/25/19 1820 11/25/19 1858 11/25/19 2023 11/26/19 0741  BP: 111/73 113/63 101/80 (!) 118/52  Pulse: 100 (!) 101 85 79  Resp: _0 Temp:  98.5 F (36.9 C) 98.2 F (36.8 C) 98.1 F (36.7 C)  TempSrc:   Oral Oral  SpO2: 96% 93% 98% 99%  Weight:      Height:        General: Pt is alert, awake, not in acute distress Cardiovascular: S1/S2 +, no rubs, no gallops Respiratory: decreased breath sounds b/l otherwise clear  Abdominal: Soft, NT, ND, bowel sounds + Extremities:  no cyanosis    The results of significant diagnostics from this hospitalization (including imaging, microbiology, ancillary and laboratory) are listed below for reference.     Microbiology: Recent Results (from the past 240 hour(s))  SARS Coronavirus 2 by RT PCR (hospital order, performed in [facility] hospital lab) Nasopharyngeal Nasopharyngeal Swab     Status: None   Collection Time: 11/24/19  5:40 PM   Specimen: Nasopharyngeal Swab  Result Value Ref Range Status   SARS Coronavirus 2 NEGATIVE NEGATIVE Final    Comment: (NOTE) SARS-CoV-2 target nucleic acids are NOT DETECTED.  The SARS-CoV-2 RNA is generally detectable in upper and lower respiratory specimens during the acute phase of infection. The lowest concentration of SARS-CoV-2 viral copies this assay can detect is 250 copies / mL. A negative result does not preclude SARS-CoV-2 infection and should not be used as the sole basis for treatment or other patient management decisions.  A negative result may occur with improper specimen collection / handling, submission of specimen other than nasopharyngeal swab, presence of viral mutation(s) within the areas targeted by this assay, and inadequate number of viral copies (<250 copies / mL). A negative result must be combined with clinical observations, patient history, and epidemiological information.  Fact Sheet for Patients:    StrictlyIdeas.no  Fact Sheet for Healthcare Providers: BankingDealers.co.za  This test is not yet approved or  cleared by the [place] FDA and has been authorized for detection and/or diagnosis of SARS-CoV-2 by FDA under an Emergency Use Authorization (EUA).  This EUA will remain in effect (meaning this test can be used) for the duration of the COVID-19 declaration under Section 564(b)(1) of the Act, 21 U.S.C. section 360bbb-3(b)(1), unless the authorization is terminated or revoked sooner.  Performed at [facility], [address] [place], [place] [address]      Labs: BNP (last 3 results) Recent Labs    11/27/18 1154 11/06/19 1110  BNP 76.0 330.0*   Basic Metabolic Panel: Recent Labs  Lab 11/24/19 1459 11/25/19 0412 11/26/19 0329  NA 136 136 137  K 4.5 4.0 4.5  CL 96* 98 99  CO2 _1 GLUCOSE 91 164* 152*  BUN 33* 25* 24*  CREATININE 1.07* 0.95 1.02*  CALCIUM 8.9 8.6* 8.4*   Liver Function Tests: Recent Labs  Lab 11/24/19 1459  AST 25  ALT 15  ALKPHOS 72  BILITOT 0.7  PROT 7.1  ALBUMIN 3.1*   No results for input(s): LIPASE, AMYLASE in the last 168 hours. No results for input(s): AMMONIA in the last 168  hours. CBC: Recent Labs  Lab 11/24/19 1459 11/24/19 1542 11/25/19 0412 11/26/19 0329  WBC 8.3 8.6 8.2 6.7  NEUTROABS  --  7.1  --   --   HGB 12.5 11.9* 11.7* 11.3*  HCT 38.5 37.2 36.2 33.6*  MCV 95.1 96.9 96.3 94.6  PLT 369 353 329 305   Cardiac Enzymes: No results for input(s): CKTOTAL, CKMB, CKMBINDEX, TROPONINI in the last 168 hours. BNP: Invalid input(s): POCBNP CBG: Recent Labs  Lab 11/25/19 2028 11/26/19 0100 11/26/19 0409 11/26/19 0741 11/26/19 1155  GLUCAP 224* 221* 134* 140* 198*   D-Dimer No results for input(s): DDIMER in the last 72 hours. Hgb A1c No results for input(s): HGBA1C in the last 72 hours. Lipid Profile No results for input(s): CHOL, HDL,  LDLCALC, TRIG, CHOLHDL, LDLDIRECT in the last 72 hours. Thyroid function studies No results for input(s): TSH, T4TOTAL, T3FREE, THYROIDAB in the last 72 hours.  Invalid input(s): FREET3 Anemia work up No results for input(s): VITAMINB12, FOLATE, FERRITIN, TIBC, IRON, RETICCTPCT in the last 72 hours. Urinalysis    Component Value Date/Time   COLORURINE YELLOW (A) 11/24/2019 1542   APPEARANCEUR CLOUDY (A) 11/24/2019 1542   LABSPEC 1.026 11/24/2019 1542   PHURINE 5.0 11/24/2019 1542   GLUCOSEU NEGATIVE 11/24/2019 1542   HGBUR LARGE (A) 11/24/2019 1542   BILIRUBINUR NEGATIVE 11/24/2019 1542   BILIRUBINUR negative 02/19/2018 1557   KETONESUR NEGATIVE 11/24/2019 1542   PROTEINUR NEGATIVE 11/24/2019 1542   UROBILINOGEN 0.2 02/19/2018 1557   NITRITE NEGATIVE 11/24/2019 1542   LEUKOCYTESUR MODERATE (A) 11/24/2019 1542   Sepsis Labs Invalid input(s): PROCALCITONIN,  WBC,  LACTICIDVEN Microbiology Recent Results (from the past 240 hour(s))  SARS Coronavirus 2 by RT PCR (hospital order, performed in [place] hospital lab) Nasopharyngeal Nasopharyngeal Swab     Status: None   Collection Time: 11/24/19  5:40 PM   Specimen: Nasopharyngeal Swab  Result Value Ref Range Status   SARS Coronavirus 2 NEGATIVE NEGATIVE Final    Comment: (NOTE) SARS-CoV-2 target nucleic acids are NOT DETECTED.  The SARS-CoV-2 RNA is generally detectable in upper and lower respiratory specimens during the acute phase of infection. The lowest concentration of SARS-CoV-2 viral copies this assay can detect is 250 copies / mL. A negative result does not preclude SARS-CoV-2 infection and should not be used as the sole basis for treatment or other patient management decisions.  A negative result may occur with improper specimen collection / handling, submission of specimen other than nasopharyngeal swab, presence of viral mutation(s) within the areas targeted by this assay, and inadequate number of viral  copies (<250 copies / mL). A negative result must be combined with clinical observations, patient history, and epidemiological information.  Fact Sheet for Patients:   StrictlyIdeas.no  Fact Sheet for Healthcare Providers: BankingDealers.co.za  This test is not yet approved or  cleared by the [place] FDA and has been authorized for detection and/or diagnosis of SARS-CoV-2 by FDA under an Emergency Use Authorization (EUA).  This EUA will remain in effect (meaning this test can be used) for the duration of the COVID-19 declaration under Section 564(b)(1) of the Act, 21 U.S.C. section 360bbb-3(b)(1), unless the authorization is terminated or revoked sooner.  Performed at [facility], [address], [place], [place] [address]      Time coordinating discharge: Over 30 minutes  SIGNED:   Wyvonnia Dusky, MD  [facility] 11/26/2019, 1:19 PM Pager   If 7PM-7AM, please contact night-coverage [email or web address]

## 2019-11-26 NOTE — Progress Notes (Signed)
Occupational Therapy Treatment Patient Details Name: Judy Harvey MRN: [id number] DOB: May 28, 1927 Today's Date: 11/26/2019    History of present illness 86 y/o F w/ PMH of HTN, HLD, DM2, depression, IDA, GERD, likely esophageal stenosis s/p multiple dilatations, CVA, carotid artery stenosis s/p stent placement who presents w/ left sided weakness for unknown duration. All of the hx was obtain from pt who is a poor historian. The left sided weakness has since resolved. MRI negative for new CVA.   OT comments  Pt seen for OT tx this date to f/u re: safety with ADLs/ADL mobility. Pt demos improved balance and fxl activity tolerance this date. Pt participates in UB bathing/dressing with setup/MIN cues in sitting. Requires CGA for LB dressing/bathing, using sit to stand technique with RW versus HHA (requires support of at least one UE in static standing while participating in functional task. Overall, pt demonstrating progress towards goals. Continue to anticipate pt could benefit from [facility] for safety considerations in the natural environment to make pt most successful since pt reports being fearful of falling again as she's fallen at home recently per her report.    Follow Up Recommendations  Home health OT;Supervision - Intermittent (SUPV for fxl mobility)    Equipment Recommendations  3 in 1 bedside commode;Tub/shower seat    Recommendations for Other Services      Precautions / Restrictions Precautions Precautions: Fall Restrictions Weight Bearing Restrictions: No       Mobility Bed Mobility               General bed mobility comments: pt up to chair pre/post treatment  Transfers Overall transfer level: Needs assistance Equipment used: 1 person hand held assist Transfers: Sit to/from Stand Sit to Stand: Min guard;Min assist         General transfer comment: MIN A for initial stand from chair, but able to complete subsequent transfers with CGA with HHA or RW just to  stabilize, but does not rely on UE support.    Balance Overall balance assessment: Needs assistance Sitting-balance support: Feet supported Sitting balance-Leahy Scale: Good     Standing balance support: Single extremity supported;During functional activity Standing balance-Leahy Scale: Fair Standing balance comment: at least 1 UE support to sustain                           ADL either performed or assessed with clinical judgement   ADL Overall ADL's : Needs assistance/impaired     Grooming: Set up;Sitting;Oral care Grooming Details (indicate cue type and reason): to brush teeth Upper Body Bathing: Set up;Sitting   Lower Body Bathing: Min guard;Sit to/from stand Lower Body Bathing Details (indicate cue type and reason): with RW from recliner chair with setup for bathing supplies-wash cloths, hot water, towels. Upper Body Dressing : Min guard;Sitting   Lower Body Dressing: Min guard;Sit to/from stand                       Vision Patient Visual Report: No change from baseline     Perception     Praxis      Cognition Arousal/Alertness: Awake/alert Behavior During Therapy: WFL for tasks assessed/performed Overall Cognitive Status: Within Functional Limits for tasks assessed  Exercises Other Exercises Other Exercises: OT facilitates education with pt re: importance of OOB activity including prevention of PNA, skin breakdown and muscle atrophy. Pt with good understanding. Other Exercises: OT educates re: safe hand placement with use of RW. Although pt insists she does not have enough room in her home to use RW, she likes to use to stabilize in standing-in which case, OT does provide education and cues for safest sequence of use during transfers.   Shoulder Instructions       General Comments      Pertinent Vitals/ Pain       Pain Assessment: No/denies pain  Home Living                                           Prior Functioning/Environment              Frequency  Min 1X/week        Progress Toward Goals  OT Goals(current goals can now be found in the care plan section)  Progress towards OT goals: Progressing toward goals  Acute Rehab OT Goals Patient Stated Goal: to get stronger and get independence back OT Goal Formulation: With patient Time For Goal Achievement: 12/09/19 Potential to Achieve Goals: Good  Plan Discharge plan remains appropriate    Co-evaluation                 AM-PAC OT "6 Clicks" Daily Activity     Outcome Measure   Help from another person eating meals?: None Help from another person taking care of personal grooming?: A Little Help from another person toileting, which includes using toliet, bedpan, or urinal?: A Little Help from another person bathing (including washing, rinsing, drying)?: A Little Help from another person to put on and taking off regular upper body clothing?: None Help from another person to put on and taking off regular lower body clothing?: A Little 6 Click Score: 20    End of Session Equipment Utilized During Treatment: Gait belt;Rolling walker  OT Visit Diagnosis: Unsteadiness on feet (R26.81);Muscle weakness (generalized) (M62.81);History of falling (Z91.81)   Activity Tolerance Patient tolerated treatment well   Patient Left in bed;with call bell/phone within reach;with bed alarm set   Nurse Communication Mobility status        Time: 3143-8887 OT Time Calculation (min): 23 min  Charges: OT General Charges $OT Visit: 1 Visit OT Treatments $Self Care/Home Management : 23-37 mins  Rejeana Brock, MS, OTR/L ascom [phone] 11/26/19, 3:14 PM

## 2019-11-26 NOTE — TOC Initial Note (Signed)
Transition of Care [facility]) - Initial/Assessment Note    Patient Details  Name: Judy Harvey MRN: [id number] Date of Birth: [birth year]  [facility]) CM/SW Contact:    Shelbie Ammons, RN Phone Number: 11/26/2019, 2:13 PM  Clinical Narrative:   RNCM met with patient at bedside. Patient reports to feeling better today and feels she is ready to go home. She reports that she lives with her son in a very small home. She does not have any equipment and does not want any due to the size of her home. Patient is agreeable to home health services and reports she has had it before but they didn't come for long. Discussed with patient that this is typically due to insurance and in home therapy is not usually a long term situation. Patient verbalizes understanding and states that she just wants to be able to move around her home and get up and go to the bathroom and to the kitchen in her home.  RNCM reached out to [facility] with [facility] and he will accept referral.          Expected Discharge Plan: [place] Barriers to Discharge: No Barriers Identified   Patient Goals and CMS Choice        Expected Discharge Plan and Services Expected Discharge Plan: [place] Choice: [place] arrangements for the past 2 months: Single Family Home Expected Discharge Date: 11/26/19                         HH Arranged: PT HH Agency: [facility] ([place]) Date [place]: 11/26/19 Time [place]: 1412 Representative spoke with at [place]: Corene Cornea  Prior Living Arrangements/Services Living arrangements for the past 2 months: [place] Lives with:: Adult Children Patient language and need for interpreter reviewed:: Yes Do you feel safe going back to the place where you live?: No      Need for Family Participation in Patient Care: Yes (Comment) Care giver support system in place?: Yes  (comment)   Criminal Activity/Legal Involvement Pertinent to Current Situation/Hospitalization: No - Comment as needed  Activities of Daily Living      Permission Sought/Granted                  Emotional Assessment Appearance:: Appears stated age Attitude/Demeanor/Rapport: Engaged Affect (typically observed): Appropriate Orientation: : Oriented to Self, Oriented to Place, Oriented to  Time, Oriented to Situation Alcohol / Substance Use: Not Applicable Psych Involvement: No (comment)  Admission diagnosis:  Carotid artery stenosis [I65.29] TIA (transient ischemic attack) [G45.9] Patient Active Problem List   Diagnosis Date Noted  . Major depressive 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 ([place]) 08/21/2018  . Low vitamin B12 level 04/26/2018  . Iron deficiency anemia 03/28/2018  . Frequent PVCs 03/21/2018  . PSVT (paroxysmal supraventricular tachycardia) ([place]) 03/21/2018  . Normocytic anemia 02/28/2018  . Benign essential HTN 02/20/2018  . SOBOE (shortness of breath on exertion) 02/20/2018  . Carotid stenosis, symptomatic w/o infarct, left 10/18/2017  . TIA (transient ischemic attack) 06/16/2017  . Bilateral carotid artery stenosis 06/16/2017  . Hyperlipidemia 06/16/2017  . Obesity (BMI 30.0-34.9) 05/02/2016  . Vitamin D deficiency 03/25/2016  . Type 2 diabetes mellitus without complication, with long-term current use of insulin ([place]) 03/24/2016  . Old cerebrovascular accident (CVA) without late effect 03/24/2016  . Hypertension 03/24/2016  . Legally  blind 03/24/2016  . Macular degeneration 03/24/2016  . Constipation 03/24/2016  . History of esophageal stricture 03/24/2016  . Depression with anxiety 03/24/2016  . Restless leg syndrome 03/24/2016  . Postherpetic neuralgia 03/24/2016  . Gait abnormality 03/24/2016   PCP:  Leonel Ramsay, MD Pharmacy:   [id number] [place] [place] [address]Phone: [phone]:  [phone]    Social Determinants of Health (SDOH) Interventions    Readmission Risk Interventions No flowsheet data found.

## 2019-11-26 NOTE — Progress Notes (Signed)
Pt not able to tolerate after 30 minutes sitting in recliner chair. Pt adamant about getting back to bed. Pt transferred via assist of a walker from recliner chair to bed.

## 2019-11-26 NOTE — Progress Notes (Signed)
Physical Therapy Treatment Patient Details Name: Judy Harvey MRN: [id number] DOB: [birth year] Today's Date: 11/26/2019    History of Present Illness 86 y/o F w/ PMH of HTN, HLD, DM2, depression, IDA, GERD, likely esophageal stenosis s/p multiple dilatations, CVA, carotid artery stenosis s/p stent placement who presents w/ left sided weakness for unknown duration. All of the hx was obtain from pt who is a poor historian. The left sided weakness has since resolved. MRI negative for new CVA.    PT Comments    Patient received in bed finishing breakfast, agrees to PT session. She states she slept well compared to down in the ED. She performed supine to sit with mod independence, transfers with min assist. Initially unsteady requiring extra time to get balanced. Ambulated to recliner with single hand held assist, then ambulated to door, bathroom and back to recliner. She is able to independently manage peri care. She reports weakness with mobility. She will continue to benefit from skilled PT while here to improve strength and functional independence.        Follow Up Recommendations  Home health PT;Supervision for mobility/OOB     Equipment Recommendations  None recommended by PT    Recommendations for Other Services       Precautions / Restrictions Precautions Precautions: Fall Restrictions Weight Bearing Restrictions: No    Mobility  Bed Mobility Overal bed mobility: Modified Independent Bed Mobility: Supine to Sit     Supine to sit: HOB elevated;Modified independent (Device/Increase time)     General bed mobility comments: able to raise trunk with use of bed rail. No physical assist provided  Transfers Overall transfer level: Needs assistance Equipment used: None Transfers: Sit to/from Stand Sit to Stand: Min guard;Min assist         General transfer comment: initially requires min assist to attain standing balance. once balanced, min  guard  Ambulation/Gait Ambulation/Gait assistance: Min guard Gait Distance (Feet): 30 Feet Assistive device: 1 person hand held assist;IV Pole Gait Pattern/deviations: Shuffle;Step-through pattern;Decreased stride length Gait velocity: decreased   General Gait Details: Patient ambulated to recliner then to door, then back to recliner. hand held assist or assist of IV pole.  Reports she is feeling weak. O2 on room air dropped to 85% when up moving around.   Stairs             Wheelchair Mobility    Modified Rankin (Stroke Patients Only)       Balance Overall balance assessment: Needs assistance Sitting-balance support: Feet supported Sitting balance-Leahy Scale: Good     Standing balance support: Single extremity supported;During functional activity Standing balance-Leahy Scale: Fair Standing balance comment: Requires UE support to stabilize                            Cognition Arousal/Alertness: Awake/alert Behavior During Therapy: WFL for tasks assessed/performed Overall Cognitive Status: Within Functional Limits for tasks assessed                                        Exercises      General Comments        Pertinent Vitals/Pain Pain Assessment: No/denies pain    Home Living                      Prior Function  PT Goals (current goals can now be found in the care plan section) Acute Rehab PT Goals Patient Stated Goal: to get stronger and get independence back PT Goal Formulation: With patient Time For Goal Achievement: 12/09/19 Potential to Achieve Goals: Good Progress towards PT goals: Progressing toward goals    Frequency    Min 2X/week      PT Plan Current plan remains appropriate    Co-evaluation              AM-PAC PT "6 Clicks" Mobility   Outcome Measure  Help needed turning from your back to your side while in a flat bed without using bedrails?: A Little Help needed moving  from lying on your back to sitting on the side of a flat bed without using bedrails?: A Little Help needed moving to and from a bed to a chair (including a wheelchair)?: A Little Help needed standing up from a chair using your arms (e.g., wheelchair or bedside chair)?: A Little Help needed to walk in hospital room?: A Little Help needed climbing 3-5 steps with a railing? : A Lot 6 Click Score: 17    End of Session Equipment Utilized During Treatment: Gait belt;Oxygen Activity Tolerance: Patient tolerated treatment well Patient left: in chair;with call bell/phone within reach;Other (comment) (OT in room) Nurse Communication: Mobility status PT Visit Diagnosis: Unsteadiness on feet (R26.81);Muscle weakness (generalized) (M62.81);Other abnormalities of gait and mobility (R26.89);History of falling (Z91.81);Difficulty in walking, not elsewhere classified (R26.2)     Time: 0900-0930 PT Time Calculation (min) (ACUTE ONLY): 30 min  Charges:  $Gait Training: 8-22 mins $Therapeutic Activity: 8-22 mins                     Audree Schrecengost, PT, GCS 11/26/19,9:56 AM

## 2020-03-24 ENCOUNTER — Ambulatory Visit (INDEPENDENT_AMBULATORY_CARE_PROVIDER_SITE_OTHER): Payer: Medicare Other | Admitting: Vascular Surgery

## 2020-03-24 ENCOUNTER — Encounter (INDEPENDENT_AMBULATORY_CARE_PROVIDER_SITE_OTHER): Payer: Medicare Other

## 2020-03-24 ENCOUNTER — Other Ambulatory Visit (INDEPENDENT_AMBULATORY_CARE_PROVIDER_SITE_OTHER): Payer: Self-pay | Admitting: Vascular Surgery

## 2020-03-24 DIAGNOSIS — I6521 Occlusion and stenosis of right carotid artery: Secondary | ICD-10-CM

## 2020-03-24 DIAGNOSIS — Z95828 Presence of other vascular implants and grafts: Secondary | ICD-10-CM

## 2020-10-02 ENCOUNTER — Ambulatory Visit (INDEPENDENT_AMBULATORY_CARE_PROVIDER_SITE_OTHER): Payer: Medicare Other | Admitting: Vascular Surgery

## 2020-10-02 ENCOUNTER — Encounter (INDEPENDENT_AMBULATORY_CARE_PROVIDER_SITE_OTHER): Payer: Medicare Other

## 2020-10-25 ENCOUNTER — Inpatient Hospital Stay
Admission: EM | Admit: 2020-10-25 | Discharge: 2020-10-28 | DRG: 193 | Disposition: A | Discharge status: Skilled Nursing Facility | Payer: Medicare Other | Attending: Internal Medicine | Admitting: Internal Medicine

## 2020-10-25 ENCOUNTER — Encounter: Payer: Self-pay | Admitting: Emergency Medicine

## 2020-10-25 ENCOUNTER — Emergency Department: Payer: Medicare Other

## 2020-10-25 ENCOUNTER — Other Ambulatory Visit: Payer: Self-pay

## 2020-10-25 DIAGNOSIS — G2581 Restless legs syndrome: Secondary | ICD-10-CM | POA: Diagnosis present

## 2020-10-25 DIAGNOSIS — K5900 Constipation, unspecified: Secondary | ICD-10-CM | POA: Diagnosis present

## 2020-10-25 DIAGNOSIS — Z87891 Personal history of nicotine dependence: Secondary | ICD-10-CM

## 2020-10-25 DIAGNOSIS — R0602 Shortness of breath: Secondary | ICD-10-CM

## 2020-10-25 DIAGNOSIS — I10 Essential (primary) hypertension: Secondary | ICD-10-CM | POA: Diagnosis not present

## 2020-10-25 DIAGNOSIS — Z8616 Personal history of COVID-19: Secondary | ICD-10-CM

## 2020-10-25 DIAGNOSIS — I2510 Atherosclerotic heart disease of native coronary artery without angina pectoris: Secondary | ICD-10-CM | POA: Diagnosis present

## 2020-10-25 DIAGNOSIS — Z7902 Long term (current) use of antithrombotics/antiplatelets: Secondary | ICD-10-CM

## 2020-10-25 DIAGNOSIS — Z833 Family history of diabetes mellitus: Secondary | ICD-10-CM

## 2020-10-25 DIAGNOSIS — F419 Anxiety disorder, unspecified: Secondary | ICD-10-CM | POA: Diagnosis present

## 2020-10-25 DIAGNOSIS — F32A Depression, unspecified: Secondary | ICD-10-CM | POA: Diagnosis present

## 2020-10-25 DIAGNOSIS — Z28311 Partially vaccinated for covid-19: Secondary | ICD-10-CM

## 2020-10-25 DIAGNOSIS — R112 Nausea with vomiting, unspecified: Secondary | ICD-10-CM

## 2020-10-25 DIAGNOSIS — E119 Type 2 diabetes mellitus without complications: Secondary | ICD-10-CM | POA: Diagnosis present

## 2020-10-25 DIAGNOSIS — F418 Other specified anxiety disorders: Secondary | ICD-10-CM

## 2020-10-25 DIAGNOSIS — J189 Pneumonia, unspecified organism: Secondary | ICD-10-CM | POA: Diagnosis not present

## 2020-10-25 DIAGNOSIS — D509 Iron deficiency anemia, unspecified: Secondary | ICD-10-CM | POA: Diagnosis present

## 2020-10-25 DIAGNOSIS — H548 Legal blindness, as defined in USA: Secondary | ICD-10-CM | POA: Diagnosis present

## 2020-10-25 DIAGNOSIS — Z820 Family history of epilepsy and other diseases of the nervous system: Secondary | ICD-10-CM

## 2020-10-25 DIAGNOSIS — Z794 Long term (current) use of insulin: Secondary | ICD-10-CM

## 2020-10-25 DIAGNOSIS — J984 Other disorders of lung: Secondary | ICD-10-CM

## 2020-10-25 DIAGNOSIS — Z8673 Personal history of transient ischemic attack (TIA), and cerebral infarction without residual deficits: Secondary | ICD-10-CM

## 2020-10-25 DIAGNOSIS — E785 Hyperlipidemia, unspecified: Secondary | ICD-10-CM | POA: Diagnosis present

## 2020-10-25 DIAGNOSIS — Z79899 Other long term (current) drug therapy: Secondary | ICD-10-CM

## 2020-10-25 DIAGNOSIS — E782 Mixed hyperlipidemia: Secondary | ICD-10-CM

## 2020-10-25 DIAGNOSIS — K529 Noninfective gastroenteritis and colitis, unspecified: Secondary | ICD-10-CM | POA: Diagnosis present

## 2020-10-25 DIAGNOSIS — J9601 Acute respiratory failure with hypoxia: Secondary | ICD-10-CM

## 2020-10-25 LAB — CBC
HCT: 35.2 % — ABNORMAL LOW (ref 36.0–46.0)
Hemoglobin: 11.3 g/dL — ABNORMAL LOW (ref 12.0–15.0)
MCH: 29.1 pg (ref 26.0–34.0)
MCHC: 32.1 g/dL (ref 30.0–36.0)
MCV: 90.7 fL (ref 80.0–100.0)
Platelets: 374 10*3/uL (ref 150–400)
RBC: 3.88 MIL/uL (ref 3.87–5.11)
RDW: 13.8 % (ref 11.5–15.5)
WBC: 27.2 10*3/uL — ABNORMAL HIGH (ref 4.0–10.5)
nRBC: 0 % (ref 0.0–0.2)

## 2020-10-25 LAB — URINALYSIS, COMPLETE (UACMP) WITH MICROSCOPIC
Bilirubin Urine: NEGATIVE
Glucose, UA: >=500 mg/dL — AB
Ketones, ur: 20 mg/dL — AB
Leukocytes,Ua: NEGATIVE
Nitrite: NEGATIVE
Protein, ur: 30 mg/dL — AB
Specific Gravity, Urine: 1.033 — ABNORMAL HIGH (ref 1.005–1.030)
pH: 5 (ref 5.0–8.0)

## 2020-10-25 LAB — BASIC METABOLIC PANEL
Anion gap: 12 (ref 5–15)
BUN: 22 mg/dL (ref 8–23)
CO2: 25 mmol/L (ref 22–32)
Calcium: 8.4 mg/dL — ABNORMAL LOW (ref 8.9–10.3)
Chloride: 95 mmol/L — ABNORMAL LOW (ref 98–111)
Creatinine, Ser: 1.14 mg/dL — ABNORMAL HIGH (ref 0.44–1.00)
GFR, Estimated: 45 mL/min — ABNORMAL LOW (ref >60)
Glucose, Bld: 348 mg/dL — ABNORMAL HIGH (ref 70–99)
Potassium: 3.8 mmol/L (ref 3.5–5.1)
Sodium: 132 mmol/L — ABNORMAL LOW (ref 135–145)

## 2020-10-25 LAB — GLUCOSE, CAPILLARY: Glucose-Capillary: 286 mg/dL — ABNORMAL HIGH (ref 70–99)

## 2020-10-25 LAB — RESP PANEL BY RT-PCR (FLU A&B, COVID) ARPGX2
Influenza A by PCR: NEGATIVE
Influenza B by PCR: NEGATIVE
SARS Coronavirus 2 by RT PCR: NEGATIVE

## 2020-10-25 LAB — PROCALCITONIN: Procalcitonin: 4.39 ng/mL

## 2020-10-25 LAB — LIPASE, BLOOD: Lipase: 21 U/L (ref 11–51)

## 2020-10-25 MED ORDER — DULOXETINE HCL 30 MG PO CPEP
30.0000 mg | ORAL_CAPSULE | Freq: Every day | ORAL | Status: DC
  Administered 2020-10-26 – 2020-10-28 (×3): 30 mg via ORAL
  Filled 2020-10-25 (×4): qty 1

## 2020-10-25 MED ORDER — ACETAMINOPHEN 650 MG RE SUPP: 650.0000 mg | Freq: Four times a day (QID) | RECTAL | Status: DC | PRN

## 2020-10-25 MED ORDER — ALBUTEROL SULFATE (2.5 MG/3ML) 0.083% IN NEBU: 2.5000 mg | INHALATION_SOLUTION | Freq: Four times a day (QID) | RESPIRATORY_TRACT | Status: DC | PRN

## 2020-10-25 MED ORDER — PROMETHAZINE HCL 25 MG/ML IJ SOLN
12.5000 mg | Freq: Three times a day (TID) | INTRAMUSCULAR | Status: DC | PRN
Start: 2020-10-25 — End: 2020-10-28
  Filled 2020-10-25: qty 0.5

## 2020-10-25 MED ORDER — GABAPENTIN 300 MG PO CAPS
300.0000 mg | ORAL_CAPSULE | Freq: Three times a day (TID) | ORAL | Status: DC
  Administered 2020-10-25 – 2020-10-28 (×8): 300 mg via ORAL
  Filled 2020-10-25 (×8): qty 1

## 2020-10-25 MED ORDER — INSULIN ASPART 100 UNIT/ML IJ SOLN
0.0000 [IU] | Freq: Every day | INTRAMUSCULAR | Status: DC
Start: 2020-10-25 — End: 2020-10-28
  Administered 2020-10-25: 3 [IU] via SUBCUTANEOUS
  Filled 2020-10-25: qty 1

## 2020-10-25 MED ORDER — ACETAMINOPHEN 500 MG PO TABS
1000.0000 mg | ORAL_TABLET | Freq: Four times a day (QID) | ORAL | Status: DC | PRN
  Administered 2020-10-27 – 2020-10-28 (×2): 1000 mg via ORAL
  Filled 2020-10-25 (×2): qty 2

## 2020-10-25 MED ORDER — SODIUM CHLORIDE 0.9 % IV SOLN
INTRAVENOUS | Status: DC | PRN
Start: 2020-10-25 — End: 2020-10-28
  Administered 2020-10-25: 250 mL via INTRAVENOUS

## 2020-10-25 MED ORDER — ONDANSETRON HCL 4 MG PO TABS: 4.0000 mg | ORAL_TABLET | Freq: Four times a day (QID) | ORAL | Status: DC | PRN

## 2020-10-25 MED ORDER — ENOXAPARIN SODIUM 40 MG/0.4ML IJ SOSY
40.0000 mg | PREFILLED_SYRINGE | INTRAMUSCULAR | Status: DC
  Administered 2020-10-25 – 2020-10-27 (×3): 40 mg via SUBCUTANEOUS
  Filled 2020-10-25 (×4): qty 0.4

## 2020-10-25 MED ORDER — ALBUTEROL SULFATE HFA 108 (90 BASE) MCG/ACT IN AERS: 2.0000 | INHALATION_SPRAY | Freq: Four times a day (QID) | RESPIRATORY_TRACT | Status: DC | PRN

## 2020-10-25 MED ORDER — METFORMIN HCL ER 500 MG PO TB24
1000.0000 mg | ORAL_TABLET | Freq: Every day | ORAL | Status: DC
  Administered 2020-10-27: 1000 mg via ORAL
  Filled 2020-10-25 (×2): qty 2

## 2020-10-25 MED ORDER — LISINOPRIL 20 MG PO TABS
40.0000 mg | ORAL_TABLET | Freq: Every day | ORAL | Status: DC
  Administered 2020-10-26 – 2020-10-28 (×3): 40 mg via ORAL
  Filled 2020-10-25 (×3): qty 2

## 2020-10-25 MED ORDER — ALBUTEROL SULFATE 1.25 MG/3ML IN NEBU: 3.0000 mL | INHALATION_SOLUTION | Freq: Four times a day (QID) | RESPIRATORY_TRACT | Status: DC | PRN

## 2020-10-25 MED ORDER — IOHEXOL 350 MG/ML SOLN
80.0000 mL | Freq: Once | INTRAVENOUS | Status: AC | PRN
  Administered 2020-10-25: 80 mL via INTRAVENOUS

## 2020-10-25 MED ORDER — ATORVASTATIN CALCIUM 20 MG PO TABS
80.0000 mg | ORAL_TABLET | Freq: Every day | ORAL | Status: DC
  Administered 2020-10-25 – 2020-10-27 (×3): 80 mg via ORAL
  Filled 2020-10-25 (×3): qty 4

## 2020-10-25 MED ORDER — SODIUM CHLORIDE 0.9 % IV SOLN
1.0000 g | Freq: Once | INTRAVENOUS | Status: AC
  Administered 2020-10-25: 1 g via INTRAVENOUS
  Filled 2020-10-25: qty 1

## 2020-10-25 MED ORDER — CEFTRIAXONE SODIUM 1 G IJ SOLR
1.0000 g | INTRAMUSCULAR | Status: DC
  Administered 2020-10-26: 1 g via INTRAVENOUS
  Filled 2020-10-25: qty 1
  Filled 2020-10-25: qty 10

## 2020-10-25 MED ORDER — SODIUM CHLORIDE 0.9 % IV SOLN
500.0000 mg | INTRAVENOUS | Status: AC
  Administered 2020-10-26 – 2020-10-27 (×2): 500 mg via INTRAVENOUS
  Filled 2020-10-25 (×2): qty 500

## 2020-10-25 MED ORDER — INSULIN ASPART 100 UNIT/ML IJ SOLN
0.0000 [IU] | Freq: Three times a day (TID) | INTRAMUSCULAR | Status: DC
  Administered 2020-10-26: 5 [IU] via SUBCUTANEOUS
  Administered 2020-10-26 – 2020-10-28 (×5): 3 [IU] via SUBCUTANEOUS
  Administered 2020-10-28: 2 [IU] via SUBCUTANEOUS
  Filled 2020-10-25 (×6): qty 1

## 2020-10-25 MED ORDER — ONDANSETRON HCL 4 MG/2ML IJ SOLN
4.0000 mg | Freq: Four times a day (QID) | INTRAMUSCULAR | Status: DC | PRN
  Administered 2020-10-26: 4 mg via INTRAVENOUS
  Filled 2020-10-25 (×2): qty 2

## 2020-10-25 MED ORDER — DILTIAZEM HCL 25 MG/5ML IV SOLN: 10.0000 mg | INTRAVENOUS | Status: DC | PRN

## 2020-10-25 MED ORDER — LACTATED RINGERS IV BOLUS
1000.0000 mL | Freq: Once | INTRAVENOUS | Status: AC
  Administered 2020-10-25: 1000 mL via INTRAVENOUS

## 2020-10-25 MED ORDER — ASPIRIN EC 81 MG PO TBEC
81.0000 mg | DELAYED_RELEASE_TABLET | Freq: Every day | ORAL | Status: DC
  Administered 2020-10-26 – 2020-10-28 (×3): 81 mg via ORAL
  Filled 2020-10-25 (×3): qty 1

## 2020-10-25 MED ORDER — INSULIN GLARGINE-YFGN 100 UNIT/ML ~~LOC~~ SOLN
38.0000 [IU] | Freq: Every day | SUBCUTANEOUS | Status: DC
  Administered 2020-10-25 – 2020-10-27 (×3): 38 [IU] via SUBCUTANEOUS
  Filled 2020-10-25 (×4): qty 0.38

## 2020-10-25 MED ORDER — VITAMIN D 25 MCG (1000 UNIT) PO TABS
5000.0000 [IU] | ORAL_TABLET | Freq: Every day | ORAL | Status: DC
  Administered 2020-10-26 – 2020-10-28 (×3): 5000 [IU] via ORAL
  Filled 2020-10-25 (×3): qty 5

## 2020-10-25 MED ORDER — ONDANSETRON HCL 4 MG/2ML IJ SOLN
4.0000 mg | Freq: Once | INTRAMUSCULAR | Status: AC
  Administered 2020-10-25: 4 mg via INTRAVENOUS
  Filled 2020-10-25: qty 2

## 2020-10-25 MED ORDER — AMLODIPINE BESYLATE 5 MG PO TABS
5.0000 mg | ORAL_TABLET | Freq: Every day | ORAL | Status: DC
  Administered 2020-10-25 – 2020-10-28 (×4): 5 mg via ORAL
  Filled 2020-10-25 (×4): qty 1

## 2020-10-25 MED ORDER — CLOPIDOGREL BISULFATE 75 MG PO TABS
75.0000 mg | ORAL_TABLET | Freq: Every day | ORAL | Status: DC
  Administered 2020-10-25 – 2020-10-28 (×4): 75 mg via ORAL
  Filled 2020-10-25 (×4): qty 1

## 2020-10-25 MED ORDER — SODIUM CHLORIDE 0.9 % IV SOLN
500.0000 mg | Freq: Once | INTRAVENOUS | Status: AC
  Administered 2020-10-26: 500 mg via INTRAVENOUS
  Filled 2020-10-25: qty 500

## 2020-10-25 MED ORDER — FERROUS SULFATE 325 (65 FE) MG PO TABS
325.0000 mg | ORAL_TABLET | Freq: Every day | ORAL | Status: DC
  Administered 2020-10-26 – 2020-10-28 (×3): 325 mg via ORAL
  Filled 2020-10-25 (×3): qty 1

## 2020-10-25 MED ORDER — DILTIAZEM HCL 25 MG/5ML IV SOLN
10.0000 mg | INTRAVENOUS | Status: DC | PRN
Start: 2020-10-25 — End: 2020-10-28
  Filled 2020-10-25: qty 5

## 2020-10-25 NOTE — ED Provider Notes (Signed)
[facility] Emergency Department Provider Note   ____________________________________________   Event Date/Time   First MD Initiated Contact with Patient 10/25/20 1631     (approximate)  I have reviewed the triage vital signs and the nursing notes.   HISTORY  Chief Complaint Weakness, Back Pain, and Cough    HPI Judy Harvey is a 87 y.o. female with past medical history of hypertension, hyperlipidemia, diabetes, and stroke who presents to the ED complaining of nausea and vomiting.  Patient reports that she has been dealing with greater than 1 week of persistent nausea with multiple episodes of vomiting and dry heaving.  She states she has been unable to keep down any liquids or solids for the past 2 days, during which time she is also been dealing with watery diarrhea.  She denies any pain in her abdomen, flank pain, dysuria, or hematuria.  She does state that she tested positive for COVID approximately 2 weeks ago, denies any fevers, chest pain, or shortness of breath.  She does endorse a dry cough.  She reports receiving 2 doses of the COVID-19 vaccine.        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) 08/21/2018   Low vitamin B12 level 04/26/2018   Iron deficiency anemia 03/28/2018   Frequent PVCs 03/21/2018   PSVT (paroxysmal supraventricular tachycardia) (HCC) 03/21/2018   Normocytic anemia 02/28/2018   Benign essential HTN 02/20/2018   SOBOE (shortness of breath on exertion) 02/20/2018   Carotid stenosis, symptomatic w/o infarct, left  10/18/2017   TIA (transient ischemic attack) 06/16/2017   Bilateral carotid artery stenosis 06/16/2017   Hyperlipidemia 06/16/2017   Obesity (BMI 30.0-34.9) 05/02/2016   Vitamin D deficiency 03/25/2016   Type 2 diabetes mellitus without complication, with long-term current use of insulin (HCC) 03/24/2016   Old cerebrovascular accident (CVA) without late effect 03/24/2016   Hypertension 03/24/2016   Legally blind 03/24/2016   Macular degeneration 03/24/2016   Constipation 03/24/2016   History of esophageal stricture 03/24/2016   Depression with anxiety 03/24/2016   Restless leg syndrome 03/24/2016   Postherpetic neuralgia 03/24/2016   Gait abnormality 03/24/2016    Past Surgical History:  Procedure Laterality Date   ABDOMINAL HYSTERECTOMY     partial   APPENDECTOMY     BREAST SURGERY     reduction   CAROTID PTA/STENT INTERVENTION Left 10/18/2017   Procedure: CAROTID PTA/STENT INTERVENTION;  Surgeon: Annice Needy, MD;  Location: [facility] INVASIVE CV LAB;  Service: Cardiovascular;  Laterality: Left;   CATARACT EXTRACTION, BILATERAL     CESAREAN SECTION     x3   COLONOSCOPY     ESOPHAGEAL DILATION      Prior to Admission medications   Medication Sig Start Date End Date Taking? Authorizing Provider  acetaminophen (TYLENOL) 325 MG tablet Take 650 mg by mouth every 6 (six) hours as needed (pain or fever).     [provider]  albuterol (ACCUNEB) 1.25 MG/3ML nebulizer solution Take 3 mLs by nebulization every  6 (six) hours as needed for wheezing or shortness of breath. 11/12/19   [provider]  albuterol (VENTOLIN HFA) 108 (90 Base) MCG/ACT inhaler Inhale 2 puffs into the lungs every 6 (six) hours as needed for shortness of breath or wheezing. 11/06/19   [provider]  aspirin EC 81 MG tablet Take 81 mg by mouth daily.    [provider]  atorvastatin (LIPITOR) 80 MG tablet TAKE 1 TABLET BY MOUTH EVERYDAY AT BEDTIME Patient taking differently: Take 80  mg by mouth at bedtime.  08/21/18   Duanne Limerick, MD  Black Cohosh 540 MG CAPS Take 1 capsule by mouth daily.    [provider]  chlorpheniramine-HYDROcodone (TUSSIONEX) 10-8 MG/5ML SUER Take 5 mLs by mouth every 12 (twelve) hours as needed for cough. 11/12/19   [provider]  Cholecalciferol (VITAMIN D3) 5000 units CAPS Take 1 capsule (5,000 Units total) by mouth daily. 06/28/16   Plonk, Chrissie Noa, MD  clopidogrel (PLAVIX) 75 MG tablet Take 1 tablet (75 mg total) by mouth daily. 08/21/18   Duanne Limerick, MD  docusate sodium (COLACE) 100 MG capsule Take 100 mg by mouth daily.    [provider]  DULoxetine (CYMBALTA) 30 MG capsule TAKE 1 CAPSULE BY MOUTH EVERY DAY Patient taking differently: Take 30 mg by mouth daily.  08/21/18   Duanne Limerick, MD  ferrous sulfate 325 (65 FE) MG tablet Take 325 mg by mouth daily with breakfast.    [provider]  insulin glargine (LANTUS) 100 UNIT/ML injection INJECT 50 UNITS INTO THE SKIN DAILY AT 10 PM. Patient taking differently: Inject 50 Units into the skin at bedtime.  08/21/18   Duanne Limerick, MD  Insulin Syringe-Needle U-100 (B-D INS SYR ULTRAFINE .3CC/31G) 31G X 5/16" 0.3 ML MISC Use to administer insulin daily to control diabetes. 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 08/21/18   Duanne Limerick, MD  lisinopril (ZESTRIL) 20 MG tablet Take 20 mg by mouth 2 (two) times daily. 10/02/19   [provider]  metFORMIN (GLUCOPHAGE) 1000 MG tablet Take 1,000 mg by mouth 2 (two) times daily. 10/21/19   [provider]  ondansetron (ZOFRAN) 8 MG tablet Take 8 mg by mouth every 8 (eight) hours as needed for nausea or vomiting.     [provider]  pantoprazole (PROTONIX) 40 MG tablet Take 40 mg by mouth 2 (two) times daily. 07/15/19   [provider]  senna-docusate (SENOKOT-S) 8.6-50 MG tablet Take 1 tablet by mouth at bedtime. 01/05/16   [provider]     Allergies Adhesive [tape] and Lyrica [pregabalin]  Family History  Problem Relation Age of Onset   Healthy Mother    Diabetes Father    Alzheimer's disease Father    Multiple sclerosis Sister    Multiple sclerosis Brother     Social History Social History   Tobacco Use   Smoking status: Former    Packs/day: 3.00    Years: 50.00    Pack years: 150.00    Types: Cigarettes    Quit date: 2008    Years since quitting: 14.6   Smokeless tobacco: Never   Tobacco comments:    smoking cessation materials not required  Vaping Use   Vaping Use: Never used  Substance Use Topics   Alcohol use: No   Drug use: No    Review of Systems  Constitutional: No fever/chills.  Positive for generalized weakness and malaise. Eyes: No  visual changes. ENT: No sore throat. Cardiovascular: Denies chest pain. Respiratory: Denies shortness of breath.  Positive for cough. Gastrointestinal: No abdominal pain.  Positive for nausea, vomiting, and diarrhea.  No constipation. Genitourinary: Negative for dysuria. Musculoskeletal: Negative for back pain. Skin: Negative for rash. Neurological: Negative for headaches, focal weakness or numbness.  ____________________________________________   PHYSICAL EXAM:  VITAL SIGNS: ED Triage Vitals [10/25/20 1412]  Enc Vitals Group     BP 130/61     Pulse Rate 97     Resp (!) 30     Temp 98.2 F (36.8 C)     Temp Source Oral     SpO2 93 %     Weight 160 lb (72.6 kg)     Height      Head Circumference      Peak Flow      Pain Score      Pain Loc      Pain Edu?      Excl. in GC?     Constitutional: Alert and oriented. Eyes: Conjunctivae are normal. Head: Atraumatic. Nose: No congestion/rhinnorhea. Mouth/Throat: Mucous membranes are very dry. Neck: Normal ROM Cardiovascular: Normal rate, regular rhythm. Grossly normal heart sounds.  2+ radial pulses bilaterally. Respiratory: Normal respiratory effort.  No retractions. Lungs  CTAB. Gastrointestinal: Soft and diffusely tender to palpation with no rebound or guarding. No distention. Genitourinary: deferred Musculoskeletal: No lower extremity tenderness nor edema. Neurologic:  Normal speech and language. No gross focal neurologic deficits are appreciated. Skin:  Skin is warm, dry and intact. No rash noted. Psychiatric: Mood and affect are normal. Speech and behavior are normal.  ____________________________________________   LABS (all labs ordered are listed, but only abnormal results are displayed)  Labs Reviewed  BASIC METABOLIC PANEL - Abnormal; Notable for the following components:      Result Value   Sodium 132 (*)    Chloride 95 (*)    Glucose, Bld 348 (*)    Creatinine, Ser 1.14 (*)    Calcium 8.4 (*)    GFR, Estimated 45 (*)    All other components within normal limits  CBC - Abnormal; Notable for the following components:   WBC 27.2 (*)    Hemoglobin 11.3 (*)    HCT 35.2 (*)    All other components within normal limits  URINALYSIS, COMPLETE (UACMP) WITH MICROSCOPIC - Abnormal; Notable for the following components:   Color, Urine YELLOW (*)    APPearance HAZY (*)    Specific Gravity, Urine 1.033 (*)    Glucose, UA >=500 (*)    Hgb urine dipstick SMALL (*)    Ketones, ur 20 (*)    Protein, ur 30 (*)    Bacteria, UA RARE (*)    All other components within normal limits  RESP PANEL BY RT-PCR (FLU A&B, COVID) ARPGX2  LIPASE, BLOOD  PROCALCITONIN  CBG MONITORING, ED   ____________________________________________  EKG  ED ECG REPORT I, Chesley Noonharles Tyjai Charbonnet, the attending physician, personally viewed and interpreted this ECG.   Date: 10/25/2020  EKG Time: 14:22  Rate: 95  Rhythm: normal sinus rhythm  Axis: LAD  Intervals:right bundle branch block and left anterior fascicular block  ST&T Change: None   PROCEDURES  Procedure(s) performed (including Critical Care):  .Critical Care  Date/Time: 10/25/2020 6:59 PM Performed by: Chesley NoonJessup,  Wayland Baik, MD Authorized by: Chesley NoonJessup, Campbell Agramonte, MD   Critical care provider statement:    Critical care time (minutes):  45   Critical care time was exclusive of:  Separately billable procedures and treating other patients and teaching time   Critical care was necessary to treat or prevent imminent or life-threatening deterioration of the following conditions:  Respiratory failure   Critical care was time spent personally by me on the following activities:  Discussions with consultants, evaluation of patient's response to treatment, examination of patient, ordering and performing treatments and interventions, ordering and review of laboratory studies, ordering and review of radiographic studies, pulse oximetry, re-evaluation of patient's condition, obtaining history from patient or surrogate and review of old charts   I assumed direction of critical care for this patient from another provider in my specialty: no     Care discussed with: admitting provider     ____________________________________________   INITIAL IMPRESSION / ASSESSMENT AND PLAN / ED COURSE      87 year old female with past medical history of hypertension, hyperlipidemia, diabetes, and stroke who presents to the ED with persistent nausea and vomiting with diarrhea for greater than the past week.  Patient does appear significantly dehydrated with mild AKI noted on labs.  She has significant leukocytosis with diffuse abdominal tenderness on exam, while COVID-19 could explain her symptoms, we will also need to rule out UTI and check CT of her abdomen/pelvis.  She is not in any respiratory distress and is maintaining O2 sats on room air.  Chest x-ray reviewed by me with chronic mild interstitial infiltrates, no focal infiltrate or edema noted.  Despite this, CT of her abdomen/pelvis demonstrates a right lower lobe pneumonia.  We will treat with Rocephin and azithromycin, suspect post COVID-pneumonia.  Patient noted to drop O2 sats to  88% on room air, was placed on 2 L nasal cannula with improvement.  Case discussed with hospitalist for admission.      ____________________________________________   FINAL CLINICAL IMPRESSION(S) / ED DIAGNOSES  Final diagnoses:  SOB (shortness of breath)  Community acquired pneumonia, unspecified laterality  Non-intractable vomiting with nausea, unspecified vomiting type  Acute respiratory failure with hypoxia [facility])     ED Discharge Orders     None        Note:  This document was prepared using Dragon voice recognition software and may include unintentional dictation errors.    Chesley Noon, MD 10/25/20 1900

## 2020-10-25 NOTE — H&P (Signed)
History and Physical   Judy Harvey PRF:[id number] DOB: [birth year] DOA: 10/25/2020  PCP: Judy Ramsay, MD  Outpatient Specialists: Dr. Alice Harvey, gastroenterology Patient coming from: Home via EMS  I have personally briefly reviewed patient's old medical records in [place].  Chief Concern: Nausea, vomiting, diarrhea  HPI: Judy Harvey is a 87 y.o. female with medical history significant for depression, anxiety, hyperlipidemia, insulin-dependent diabetes mellitus, CAD, hyperlipidemia, presents to the emergency department for chief concerns of nausea vomiting and shortness of breath.  She was recently at [facility] with family two weeks ago. She came back on Monday, 8/1 and got sick with covid on 8/3. She endorses nausea that started one week ago. She reports the diarrhea has been off and on but last night was terribleShe endorses two weeks of shortness of breath and cough.  At bedside, she is able to tell me her name, age, location of hospital, and the president.  She did not like saying that Judy Harvey was the president.  Social history: She lives at home with her son. She denies etoh, recreational drug use, tobacco use. She is retired and formerly worked as a Network engineer.  Vaccination history: She had two doses of covid vaccine, and does not know which one.  ROS: Constitutional: no weight change, no fever ENT/Mouth: no sore throat, no rhinorrhea Eyes: no eye pain, no vision changes Cardiovascular: no chest pain, + dyspnea,  no edema, no palpitations Respiratory: no cough, no sputum, no wheezing Gastrointestinal: + nausea, + vomiting, no diarrhea, no constipation Genitourinary: no urinary incontinence, no dysuria, no hematuria Musculoskeletal: no arthralgias, no myalgias Skin: no skin lesions, no pruritus, Neuro: + weakness, no loss of consciousness, no syncope Psych: no anxiety, no depression, + decrease appetite Heme/Lymph: no bruising, no bleeding  ED Course:  Discussed with emergency medicine provider, patient requiring hospitalization for pneumonia.  Vitals in the emergency department is remarkable for temperature 98.2, increased respiration rate of 30, heart rate of 97, blood pressure 130/61 and increased to 156/76, SPO2 of 93% on 2 L nasal cannula.  Labs in the emergency department was remarkable for WBC 27.2, hemoglobin 11.3, platelets 374, sodium 132, potassium 3.8, chloride 95, bicarb 25, BUN 22, serum creatinine of 1.14, nonfasting blood glucose 348, EGFR 45.  COVID PCR is pending.  In the emergency department while patient was being moved for an In-N-Out Foley catheter, her SPO2 dropped to 88% on room air.  She was placed on 2 L nasal cannula.  ED provider states that patient states that she tested positive for COVID-19 on August 3.  Per ED triage notes, patient tested positive for COVID on 10/14/2020.  Patient was given normal saline 400 mm on route and 4 mg of Zofran by EMS.  Assessment/Plan  Principal Problem:   Right lower lobe pneumonia Active Problems:   Constipation   Depression with anxiety   Restless leg syndrome   Hyperlipidemia   Iron deficiency anemia   Benign essential HTN   # New right lower lobe pneumonia - MRSA PCR check - Status post azithromycin and ceftriaxone per EDP - COVID PCR is pending  # Nausea and vomiting and diarrhea suspect secondary to gastroenteritis in setting of pneumonia versus recent COVID infection - Symptomatic support with ondansetron 4 mg p.o. every 6 hours as needed for nausea, ondansetron 4 mg IV every 6 hours.  For nausea and vomiting - Patient received lactated ringer 1 L bolus and at this time is maintaining appropriate MAP with mild  hypertension, therefore no further clinical indications for IV fluid at this time - GI panel, C. difficile PCR ordered  # Depression/anxiety-resumed home duloxetine 30 mg p.o. daily  # Insulin-dependent diabetes mellitus-patient is prescribed glargine 50  units nightly however she states that she only takes 38 units nightly - Glargine 38 units nightly has been resumed, metformin 1000 mg daily with supper resumed - Insulin SSI with at bedtime coverage ordered  # History of hypertension-resumed home amlodipine 5 mg daily, lisinopril 40 mg daily - Diltiazem 10 mg IV injection every 4 hours as needed for SBP greater than 160, 4 doses ordered  # Hyperlipidemia-atorvastatin 80 mg nightly  # CAD-Plavix 75 mg daily resumed atorvastatin 80 mg resumed as above  Chart reviewed.   DVT prophylaxis: Enoxaparin 40 mg subcutaneous every 24 hours Code Status: Limited, no to intubation Diet: Heart healthy/carb modified Family Communication: No Disposition Plan: Pending clinical course Consults called: None at this time Admission status: MedSurg, observation, telemetry  Past Medical History:  Diagnosis Date   Anemia    Depression    Diabetes mellitus without complication ([place])    GERD (gastroesophageal reflux disease)    Hyperlipidemia    Hypertension    Legally blind    Macular degeneration, bilateral    Peripheral neuropathy    Post herpetic neuralgia    located in her back   Stroke [facility])    Past Surgical History:  Procedure Laterality Date   ABDOMINAL HYSTERECTOMY     partial   APPENDECTOMY     BREAST SURGERY     reduction   CAROTID PTA/STENT INTERVENTION Left 10/18/2017   Procedure: CAROTID PTA/STENT INTERVENTION;  Surgeon: Algernon Huxley, MD;  Location: [facility];  Service: Cardiovascular;  Laterality: Left;   CATARACT EXTRACTION, BILATERAL     CESAREAN SECTION     x3   COLONOSCOPY     ESOPHAGEAL DILATION     Social History:  reports that she quit smoking about 14 years ago. Her smoking use included cigarettes. She has a 150.00 pack-year smoking history. She has never used smokeless tobacco. She reports that she does not drink alcohol and does not use drugs.  Allergies  Allergen Reactions   Adhesive [Tape] Other (See  Comments)    "pulls my skin off" paper tape ok   Lyrica [Pregabalin]     Body spasms   Family History  Problem Relation Age of Onset   Healthy Mother    Diabetes Father    Alzheimer's disease Father    Multiple sclerosis Sister    Multiple sclerosis Brother    Family history: Family history reviewed and not pertinent.  Prior to Admission medications   Medication Sig Start Date End Date Taking? Authorizing Provider  acetaminophen (TYLENOL) 325 MG tablet Take 650 mg by mouth every 6 (six) hours as needed (pain Harvey fever).     [provider]  albuterol (ACCUNEB) 1.25 MG/3ML nebulizer solution Take 3 mLs by nebulization every 6 (six) hours as needed for wheezing Harvey shortness of breath. 11/12/19   [provider]  albuterol (VENTOLIN HFA) 108 (90 Base) MCG/ACT inhaler Inhale 2 puffs into the lungs every 6 (six) hours as needed for shortness of breath Harvey wheezing. 11/06/19   [provider]  aspirin EC 81 MG tablet Take 81 mg by mouth daily.    [provider]  atorvastatin (LIPITOR) 80 MG tablet TAKE 1 TABLET BY MOUTH EVERYDAY AT BEDTIME Patient taking differently: Take 80 mg by  mouth at bedtime.  08/21/18   Juline Patch, MD  Black Cohosh 540 MG CAPS Take 1 capsule by mouth daily.    [provider]  chlorpheniramine-HYDROcodone (TUSSIONEX) 10-8 MG/5ML SUER Take 5 mLs by mouth every 12 (twelve) hours as needed for cough. 11/12/19   [provider]  Cholecalciferol (VITAMIN D3) 5000 units CAPS Take 1 capsule (5,000 Units total) by mouth daily. 06/28/16   Plonk, Gwyndolyn Saxon, MD  clopidogrel (PLAVIX) 75 MG tablet Take 1 tablet (75 mg total) by mouth daily. 08/21/18   Juline Patch, MD  docusate sodium (COLACE) 100 MG capsule Take 100 mg by mouth daily.    [provider]  DULoxetine (CYMBALTA) 30 MG capsule TAKE 1 CAPSULE BY MOUTH EVERY DAY Patient taking differently: Take 30 mg by mouth daily.  08/21/18   Juline Patch, MD  ferrous  sulfate 325 (65 FE) MG tablet Take 325 mg by mouth daily with breakfast.    [provider]  insulin glargine (LANTUS) 100 UNIT/ML injection INJECT 50 UNITS INTO THE SKIN DAILY AT 10 PM. Patient taking differently: Inject 50 Units into the skin at bedtime.  08/21/18   Juline Patch, MD  Insulin Syringe-Needle U-100 (B-D INS SYR ULTRAFINE .3CC/31G) 31G X 5/16" 0.3 ML MISC Use to administer insulin daily to control diabetes. 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 08/21/18   Juline Patch, MD  lisinopril (ZESTRIL) 20 MG tablet Take 20 mg by mouth 2 (two) times daily. 10/02/19   [provider]  metFORMIN (GLUCOPHAGE) 1000 MG tablet Take 1,000 mg by mouth 2 (two) times daily. 10/21/19   [provider]  ondansetron (ZOFRAN) 8 MG tablet Take 8 mg by mouth every 8 (eight) hours as needed for nausea Harvey vomiting.     [provider]  pantoprazole (PROTONIX) 40 MG tablet Take 40 mg by mouth 2 (two) times daily. 07/15/19   [provider]  senna-docusate (SENOKOT-S) 8.6-50 MG tablet Take 1 tablet by mouth at bedtime. 01/05/16   [provider]   Physical Exam: Vitals:   10/25/20 1412 10/25/20 1823 10/25/20 1830 10/25/20 1900  BP: 130/61 (!) 156/76    Pulse: 97 100 (!) 103   Resp: (!) 30 (!) 25    Temp: 98.2 F (36.8 C)     TempSrc: Oral     SpO2: 93% (!) 88% 98%   Weight: 72.6 kg     Height:    5' 2"  (1.575 m)   Constitutional: appears younger than chronological age, frail, NAD, calm, comfortable Eyes: PERRL, lids and conjunctivae normal ENMT: Mucous membranes are moist. Posterior pharynx clear of any exudate Harvey lesions. Age-appropriate dentition. Hearing loss Neck: normal, supple, no masses, no thyromegaly Respiratory: clear to auscultation bilaterally, no wheezing, no crackles. Mild respiratory effort. No accessory muscle use.  Cardiovascular: Regular rate and rhythm, no murmurs / rubs / gallops. No extremity edema.  2+ pedal pulses. No carotid bruits.  Abdomen: obese abdomen, no tenderness, no masses palpated, no hepatosplenomegaly. Bowel sounds positive.  Musculoskeletal: no clubbing / cyanosis. No joint deformity upper and lower extremities. Good ROM, no contractures, no atrophy. Normal muscle tone.  Skin: no rashes, lesions, ulcers. No induration Neurologic: Sensation intact. Strength 5/5 in all 4.  Psychiatric: Normal judgment and insight. Alert and oriented x 3. Normal mood.   EKG: independently reviewed, showing sinus rhythm with rate of 95, QTc 480, right bundle branch block  Chest x-ray on Admission: I personally  reviewed and I agree with radiologist reading as below.  DG Chest 2 View  Result Date: 10/25/2020 CLINICAL DATA:  Nausea, vomiting, diarrhea for 1 week. EXAM: CHEST - 2 VIEW COMPARISON:  Chest radiograph dated 03/05/2016. FINDINGS: The heart size is normal. Vascular calcifications are seen in the aortic arch. Mild chronic interstitial opacities, right greater than left, are noted. There is no pleural effusion Harvey pneumothorax. The visualized skeletal structures are unremarkable. IMPRESSION: Mild chronic appearing interstitial opacities. Aortic Atherosclerosis (ICD10-I70.0). Electronically Signed   By: Zerita Boers M.D.   On: 10/25/2020 15:09   CT Abdomen Pelvis W Contrast  Result Date: 10/25/2020 CLINICAL DATA:  Acute abdominal pain EXAM: CT ABDOMEN AND PELVIS WITH CONTRAST TECHNIQUE: Multidetector CT imaging of the abdomen and pelvis was performed using the standard protocol following bolus administration of intravenous contrast. CONTRAST:  73m OMNIPAQUE IOHEXOL 350 MG/ML SOLN COMPARISON:  11/06/2018 FINDINGS: Lower chest: Left lung base is within normal limits. Small right-sided effusion and patchy right lower lobe pneumonia is seen. Hepatobiliary: No focal liver abnormality is seen. No gallstones, gallbladder wall thickening, Harvey biliary dilatation. Pancreas: A few small simple appearing  cysts are noted within the head of the pancreas. No inflammatory changes are noted. Spleen: Spleen is within normal limits. There is a central calcification seen stable from the prior exam consistent with a calcified splenic aneurysm. It measures 8 mm in dimension. Adrenals/Urinary Tract: Adrenal glands are unremarkable. Stable right renal cysts are seen. Normal excretion of contrast is noted bilaterally. Scattered nonobstructing left upper pole renal stones are seen. No obstructive changes are noted. The bladder is well distended with opacified urine. Stomach/Bowel: Scattered diverticular change of the colon is noted. No obstructive Harvey inflammatory changes are seen. The appendix is not well visualized consistent with the prior surgical history. Small bowel is unremarkable. Stomach demonstrates a moderate-sized sliding-type hiatal hernia. Vascular/Lymphatic: Aortic atherosclerosis. No enlarged abdominal Harvey pelvic lymph nodes. Reproductive: Uterus and bilateral adnexa are unremarkable. Other: No abdominal wall hernia Harvey abnormality. No abdominopelvic ascites. Musculoskeletal: No acute Harvey significant osseous findings. Chronic T12 compression deformity is noted. IMPRESSION: Right-sided pneumonia with associated effusion. Scattered nonobstructing left renal stones. Diverticulosis without diverticulitis. Hiatal hernia. Stable calcified splenic artery aneurysm. Electronically Signed   By: MInez CatalinaM.D.   On: 10/25/2020 18:06    Labs on Admission: I have personally reviewed following labs  CBC: Recent Labs  Lab 10/25/20 1400  WBC 27.2*  HGB 11.3*  HCT 35.2*  MCV 90.7  PLT 3875  Basic Metabolic Panel: Recent Labs  Lab 10/25/20 1400  NA 132*  K 3.8  CL 95*  CO2 25  GLUCOSE 348*  BUN 22  CREATININE 1.14*  CALCIUM 8.4*   GFR: Estimated Creatinine Clearance: 30 mL/min (A) (by C-G formula based on SCr of 1.14 mg/dL (H)).  Liver Function Tests: No results for input(s): AST, ALT, ALKPHOS,  BILITOT, PROT, ALBUMIN in the last 168 hours.  Recent Labs  Lab 10/25/20 1400  LIPASE 21   Urine analysis:    Component Value Date/Time   COLORURINE YELLOW (A) 10/25/2020 1803   APPEARANCEUR HAZY (A) 10/25/2020 1803   LABSPEC 1.033 (H) 10/25/2020 1803   PHURINE 5.0 10/25/2020 1803   GLUCOSEU >=500 (A) 10/25/2020 1803   HGBUR SMALL (A) 10/25/2020 1803   BILIRUBINUR NEGATIVE 10/25/2020 1803   BILIRUBINUR negative 02/19/2018 1557   KETONESUR 20 (A) 10/25/2020 1803   PROTEINUR 30 (A) 10/25/2020 1803   UROBILINOGEN 0.2 02/19/2018 1557   NITRITE  NEGATIVE 10/25/2020 1803   LEUKOCYTESUR NEGATIVE 10/25/2020 1803   Dr. Tobie Poet [facility]  If 7PM-7AM, please contact overnight-coverage provider If 7AM-7PM, please contact day coverage provider [email or web address]  10/25/2020, 7:21 PM

## 2020-10-25 NOTE — ED Triage Notes (Signed)
Pt in from home via [facility] with c/o N/V/D x 1 wk. S/p Covid 8/3. Has been unable to take meds for days. CBG 350. Given NS en route and 4mg  Zofran

## 2020-10-25 NOTE — ED Notes (Addendum)
Pt calling out using call light to inquire about a noise in her rm and her bed status. Pt informed that the rm is being cleaned at this time and as soon as it is marked ready, we would get her moved. Pt made aware that the noise that she is hearing was coming from the monitor. Monitor placed on comfort care mode at this time so that it does not disturb pt. Pt asking about nausea medication, will inform RN. Pt repositioned in bed. Call light within reach. Pt has no further needs at this time.

## 2020-10-25 NOTE — ED Notes (Signed)
Pt O2 at 85% on RA, put pt on 2L [place]. O2 now 98%

## 2020-10-26 DIAGNOSIS — J9601 Acute respiratory failure with hypoxia: Secondary | ICD-10-CM | POA: Diagnosis present

## 2020-10-26 DIAGNOSIS — F418 Other specified anxiety disorders: Secondary | ICD-10-CM | POA: Diagnosis not present

## 2020-10-26 DIAGNOSIS — Z28311 Partially vaccinated for covid-19: Secondary | ICD-10-CM | POA: Diagnosis not present

## 2020-10-26 DIAGNOSIS — F419 Anxiety disorder, unspecified: Secondary | ICD-10-CM | POA: Diagnosis present

## 2020-10-26 DIAGNOSIS — Z820 Family history of epilepsy and other diseases of the nervous system: Secondary | ICD-10-CM | POA: Diagnosis not present

## 2020-10-26 DIAGNOSIS — E785 Hyperlipidemia, unspecified: Secondary | ICD-10-CM | POA: Diagnosis present

## 2020-10-26 DIAGNOSIS — Z833 Family history of diabetes mellitus: Secondary | ICD-10-CM | POA: Diagnosis not present

## 2020-10-26 DIAGNOSIS — D509 Iron deficiency anemia, unspecified: Secondary | ICD-10-CM | POA: Diagnosis present

## 2020-10-26 DIAGNOSIS — Z7902 Long term (current) use of antithrombotics/antiplatelets: Secondary | ICD-10-CM | POA: Diagnosis not present

## 2020-10-26 DIAGNOSIS — Z87891 Personal history of nicotine dependence: Secondary | ICD-10-CM | POA: Diagnosis not present

## 2020-10-26 DIAGNOSIS — R0602 Shortness of breath: Secondary | ICD-10-CM | POA: Diagnosis present

## 2020-10-26 DIAGNOSIS — J189 Pneumonia, unspecified organism: Secondary | ICD-10-CM | POA: Diagnosis present

## 2020-10-26 DIAGNOSIS — Z8616 Personal history of COVID-19: Secondary | ICD-10-CM | POA: Diagnosis not present

## 2020-10-26 DIAGNOSIS — K529 Noninfective gastroenteritis and colitis, unspecified: Secondary | ICD-10-CM | POA: Diagnosis present

## 2020-10-26 DIAGNOSIS — G2581 Restless legs syndrome: Secondary | ICD-10-CM | POA: Diagnosis present

## 2020-10-26 DIAGNOSIS — K5900 Constipation, unspecified: Secondary | ICD-10-CM | POA: Diagnosis present

## 2020-10-26 DIAGNOSIS — Z794 Long term (current) use of insulin: Secondary | ICD-10-CM | POA: Diagnosis not present

## 2020-10-26 DIAGNOSIS — E119 Type 2 diabetes mellitus without complications: Secondary | ICD-10-CM | POA: Diagnosis present

## 2020-10-26 DIAGNOSIS — Z8673 Personal history of transient ischemic attack (TIA), and cerebral infarction without residual deficits: Secondary | ICD-10-CM | POA: Diagnosis not present

## 2020-10-26 DIAGNOSIS — I2510 Atherosclerotic heart disease of native coronary artery without angina pectoris: Secondary | ICD-10-CM | POA: Diagnosis present

## 2020-10-26 DIAGNOSIS — I10 Essential (primary) hypertension: Secondary | ICD-10-CM | POA: Diagnosis present

## 2020-10-26 DIAGNOSIS — F32A Depression, unspecified: Secondary | ICD-10-CM | POA: Diagnosis present

## 2020-10-26 DIAGNOSIS — H548 Legal blindness, as defined in USA: Secondary | ICD-10-CM | POA: Diagnosis present

## 2020-10-26 DIAGNOSIS — Z79899 Other long term (current) drug therapy: Secondary | ICD-10-CM | POA: Diagnosis not present

## 2020-10-26 LAB — GLUCOSE, CAPILLARY
Glucose-Capillary: 200 mg/dL — ABNORMAL HIGH (ref 70–99)
Glucose-Capillary: 202 mg/dL — ABNORMAL HIGH (ref 70–99)
Glucose-Capillary: 165 mg/dL — ABNORMAL HIGH (ref 70–99)
Glucose-Capillary: 124 mg/dL — ABNORMAL HIGH (ref 70–99)
Glucose-Capillary: 144 mg/dL — ABNORMAL HIGH (ref 70–99)

## 2020-10-26 LAB — C DIFFICILE QUICK SCREEN W PCR REFLEX
C Diff antigen: NEGATIVE
C Diff interpretation: NOT DETECTED
C Diff toxin: NEGATIVE

## 2020-10-26 LAB — GASTROINTESTINAL PANEL BY PCR, STOOL (REPLACES STOOL CULTURE)

## 2020-10-26 LAB — BASIC METABOLIC PANEL
Anion gap: 8 (ref 5–15)
BUN: 16 mg/dL (ref 8–23)
CO2: 30 mmol/L (ref 22–32)
Calcium: 8.4 mg/dL — ABNORMAL LOW (ref 8.9–10.3)
Chloride: 99 mmol/L (ref 98–111)
Creatinine, Ser: 0.94 mg/dL (ref 0.44–1.00)
GFR, Estimated: 57 mL/min — ABNORMAL LOW (ref >60)
Glucose, Bld: 209 mg/dL — ABNORMAL HIGH (ref 70–99)
Potassium: 3.6 mmol/L (ref 3.5–5.1)
Sodium: 137 mmol/L (ref 135–145)

## 2020-10-26 LAB — CBC
HCT: 30.1 % — ABNORMAL LOW (ref 36.0–46.0)
Hemoglobin: 9.9 g/dL — ABNORMAL LOW (ref 12.0–15.0)
MCH: 29.6 pg (ref 26.0–34.0)
MCHC: 32.9 g/dL (ref 30.0–36.0)
MCV: 89.9 fL (ref 80.0–100.0)
Platelets: 365 10*3/uL (ref 150–400)
RBC: 3.35 MIL/uL — ABNORMAL LOW (ref 3.87–5.11)
RDW: 13.7 % (ref 11.5–15.5)
WBC: 24.3 10*3/uL — ABNORMAL HIGH (ref 4.0–10.5)
nRBC: 0.1 % (ref 0.0–0.2)

## 2020-10-26 LAB — MRSA NEXT GEN BY PCR, NASAL: MRSA by PCR Next Gen: NOT DETECTED

## 2020-10-26 LAB — HEMOGLOBIN A1C
Hgb A1c MFr Bld: 8.1 % — ABNORMAL HIGH (ref 4.8–5.6)
Mean Plasma Glucose: 185.77 mg/dL

## 2020-10-26 NOTE — Progress Notes (Signed)
[facility]  - [place] [facility]   PATIENT NAME: Judy Harvey    MR#:  [id number]  DATE OF BIRTH:  01/11/1928  SUBJECTIVE:  patient came in from home with generalized weakness poor appetite shortness of breath and cough. She was diagnosed with COVID in early part of August. Had white count of 26,000 found to have right lower lobe pneumonia  history of poor appetite. Although tells me her cough is better. No fever.  REVIEW OF SYSTEMS:   Review of Systems  Constitutional:  Negative for chills, fever and weight loss.  HENT:  Positive for hearing loss. Negative for ear discharge, ear pain and nosebleeds.        Legally blind  Eyes:  Negative for blurred vision, pain and discharge.  Respiratory:  Positive for cough and sputum production. Negative for shortness of breath, wheezing and stridor.   Cardiovascular:  Negative for chest pain, palpitations, orthopnea and PND.  Gastrointestinal:  Negative for abdominal pain, diarrhea, nausea and vomiting.  Genitourinary:  Negative for frequency and urgency.  Musculoskeletal:  Negative for back pain and joint pain.  Neurological:  Positive for weakness. Negative for sensory change, speech change and focal weakness.  Psychiatric/Behavioral:  Negative for depression and hallucinations. The patient is not nervous/anxious.   Tolerating Diet:yes Tolerating PT: SNF  DRUG ALLERGIES:   Allergies  Allergen Reactions  . Adhesive [Tape] Other (See Comments)    "pulls my skin off" paper tape ok  . Lyrica [Pregabalin]     Body spasms    VITALS:  Blood pressure 133/69, pulse 81, temperature 98.6 F (37 C), resp. rate 18, height 5\' 2"  (1.575 m), weight 90.3 kg, SpO2 97 %.  PHYSICAL EXAMINATION:   Physical Exam  GENERAL:  87 y.o.-year-old patient lying in the bed with no acute distress.  HEENT: legally blind, HOH LUNGS: decreased breath sounds bilaterally, no wheezing, rales, rhonchi. No use of accessory muscles of  respiration.  CARDIOVASCULAR: S1, S2 normal. No murmurs, rubs, or gallops.  ABDOMEN: Soft, nontender, nondistended. Bowel sounds present. EXTREMITIES: No cyanosis, clubbing or edema b/l.    NEUROLOGIC:non focal  PSYCHIATRIC:  patient is alert and awake  SKIN: No obvious rash, lesion, or ulcer.   LABORATORY PANEL:  CBC Recent Labs  Lab 10/26/20 0540  WBC 24.3*  HGB 9.9*  HCT 30.1*  PLT 365    Chemistries  Recent Labs  Lab 10/26/20 0540  NA 137  K 3.6  CL 99  CO2 30  GLUCOSE 209*  BUN 16  CREATININE 0.94  CALCIUM 8.4*   Cardiac Enzymes No results for input(s): TROPONINI in the last 168 hours. RADIOLOGY:  DG Chest 2 View  Result Date: 10/25/2020 CLINICAL DATA:  Nausea, vomiting, diarrhea for 1 week. EXAM: CHEST - 2 VIEW COMPARISON:  Chest radiograph dated 03/05/2016. FINDINGS: The heart size is normal. Vascular calcifications are seen in the aortic arch. Mild chronic interstitial opacities, right greater than left, are noted. There is no pleural effusion or pneumothorax. The visualized skeletal structures are unremarkable. IMPRESSION: Mild chronic appearing interstitial opacities. Aortic Atherosclerosis (ICD10-I70.0). Electronically Signed   By: 03/07/2016 M.D.   On: 10/25/2020 15:09   CT Abdomen Pelvis W Contrast  Result Date: 10/25/2020 CLINICAL DATA:  Acute abdominal pain EXAM: CT ABDOMEN AND PELVIS WITH CONTRAST TECHNIQUE: Multidetector CT imaging of the abdomen and pelvis was performed using the standard protocol following bolus administration of intravenous contrast. CONTRAST:  51mL OMNIPAQUE IOHEXOL 350 MG/ML SOLN COMPARISON:  11/06/2018  FINDINGS: Lower chest: Left lung base is within normal limits. Small right-sided effusion and patchy right lower lobe pneumonia is seen. Hepatobiliary: No focal liver abnormality is seen. No gallstones, gallbladder wall thickening, or biliary dilatation. Pancreas: A few small simple appearing cysts are noted within the head of the  pancreas. No inflammatory changes are noted. Spleen: Spleen is within normal limits. There is a central calcification seen stable from the prior exam consistent with a calcified splenic aneurysm. It measures 8 mm in dimension. Adrenals/Urinary Tract: Adrenal glands are unremarkable. Stable right renal cysts are seen. Normal excretion of contrast is noted bilaterally. Scattered nonobstructing left upper pole renal stones are seen. No obstructive changes are noted. The bladder is well distended with opacified urine. Stomach/Bowel: Scattered diverticular change of the colon is noted. No obstructive or inflammatory changes are seen. The appendix is not well visualized consistent with the prior surgical history. Small bowel is unremarkable. Stomach demonstrates a moderate-sized sliding-type hiatal hernia. Vascular/Lymphatic: Aortic atherosclerosis. No enlarged abdominal or pelvic lymph nodes. Reproductive: Uterus and bilateral adnexa are unremarkable. Other: No abdominal wall hernia or abnormality. No abdominopelvic ascites. Musculoskeletal: No acute or significant osseous findings. Chronic T12 compression deformity is noted. IMPRESSION: Right-sided pneumonia with associated effusion. Scattered nonobstructing left renal stones. Diverticulosis without diverticulitis. Hiatal hernia. Stable calcified splenic artery aneurysm. Electronically Signed   By: Alcide Clever M.D.   On: 10/25/2020 18:06   ASSESSMENT AND PLAN:  Judy Harvey is a 87 y.o. female with medical history significant for depression, anxiety, hyperlipidemia, insulin-dependent diabetes mellitus, CAD, hyperlipidemia, presents to the emergency department for chief concerns of nausea vomiting and shortness of breath.   New right lower lobe pneumonia - MRSA PCR negative --GI pcr negative - Cont azithromycin and ceftriaxone  - COVID PCR is negative    Nausea and vomiting and diarrhea suspect secondary to gastroenteritis in setting of pneumonia versus  recent COVID infection - Symptomatic support with ondansetron  -- patient received IV fluids  - GI panel negative , C. difficile PCR negative    Depression/anxiety-resumed home duloxetine    Insulin-dependent diabetes mellitus -- sliding scale insulin and Lantus  Hypertension - resumed Norvasc, diltiazem, lisinopril   Hyperlipidemia - cont atorvastatin     CAD- cont Plavix 75 mg daily   generalized weakness legally blind both eyes -- seen by physical therapy. Recommends rehab   Procedures: Family communication :son mark on the phone Consults : CODE STATUS: PArtial DVT Prophylaxis :lovenox Level of care: Med-Surg Status is: Inpatient  Remains inpatient appropriate because:Inpatient level of care appropriate due to severity of illness  Dispo: The patient is from: Home              Anticipated d/c is to: SNF in pt agrees              Patient currently is not medically stable to d/c.   Difficult to place patient No        TOTAL TIME TAKING CARE OF THIS PATIENT: 25 minutes.  >50% time spent on counselling and coordination of care  Note: This dictation was prepared with Dragon dictation along with smaller phrase technology. Any transcriptional errors that result from this process are unintentional.  Enedina Finner M.D    [facility]   CC: Primary care physician; Mick Sell, MD Patient ID: Vernell Morgans, female   DOB: Oct 15, 1927, 87 y.o.   MRN: [id number]

## 2020-10-26 NOTE — Evaluation (Signed)
Physical Therapy Evaluation Patient Details Name: Judy Harvey MRN: [id number] DOB: [birth year] Today's Date: 10/26/2020   History of Present Illness  Patient is a 87 year old female who presents with two weeks of shortness of breath and cough. Patient went to [facility] with family two weeks prior and came back with covid on 8/3. PMH includes depression, anxiety, HLD, insulin dependent DM, CAD, restless leg, stroke, macular degeneration, peripheral neuropathy, post herpetic neuralgia, legally blind, major depressive disorder, and TIA.   Clinical Impression  Patient is a pleasant 87 year old female with diffuse weakness and limited mobility. Prior to hospital admission, pt was mostly independent with ambulation and dressing however needed assistance with meal prep due to visual deficits. She lives with her son in a [place] year old farmhouse which is very narrow. .  Currently pt is in bed upon PT arrival on 2 L 02 oxygen. She is agreeable to evaluation and able to follow simple commands despite HOH. She is very pleasant and eager to participate. She requires min A to sit EOB and has frequent posterior LOB. Her oxygen dropped to 93% sitting but returned to >95% with prolonged seated position. Upon attempting to stand patient requires min/mod a with SP02 dropping to 91% and patient not tolerating stand returning to sit within a minute of attempt. She requires to lay back down due to fatigue and requires min A to return to supine position. Patient was not able to attempt ambulation this session due to profound weakness.  Pt would benefit from skilled PT to address noted impairments and functional limitations (see below for any additional details).  Upon hospital discharge, pt would benefit from SNF placement at this time as she is not safe for home and at high risk for re-admission. If she is able to functionally ambulate by discharge her discharge recommendation may be changed however at this time she is  not safe for home.      Follow Up Recommendations SNF    Equipment Recommendations  3in1 (PT)    Recommendations for Other Services OT consult     Precautions / Restrictions Precautions Precautions: Fall Precaution Comments: vision, weakness, Restrictions Weight Bearing Restrictions: No      Mobility  Bed Mobility Overal bed mobility: Needs Assistance Bed Mobility: Supine to Sit;Sit to Supine     Supine to sit: Min assist Sit to supine: Min assist   General bed mobility comments: Patient requires Min A for sitting EOB as well as assistance with feet to return to bed.    Transfers Overall transfer level: Needs assistance Equipment used: Rolling walker (2 wheeled) Transfers: Sit to/from Stand Sit to Stand: Min assist;Mod assist;From elevated surface         General transfer comment: Patient requires assistance for hand placement and transfer from raised surface.  Ambulation/Gait             General Gait Details: unable to assess this session, patient did not tolerate standing more than 30 seconds.  Stairs            Wheelchair Mobility    Modified Rankin (Stroke Patients Only)       Balance Overall balance assessment: Needs assistance Sitting-balance support: Bilateral upper extremity supported;Feet supported Sitting balance-Leahy Scale: Poor Sitting balance - Comments: posterior sway with LE strength testing   Standing balance support: Bilateral upper extremity supported Standing balance-Leahy Scale: Poor Standing balance comment: unable to tolerate standing  Pertinent Vitals/Pain Pain Assessment: No/denies pain    Home Living Family/patient expects to be discharged to:: Private residence Living Arrangements: Children Available Help at Discharge: Family Type of Home: House Home Access: Stairs to enter   Secretary/administrator of Steps: 1 Home Layout: Two level;Able to live on main level with  bedroom/bathroom Home Equipment: Dan Humphreys - 2 wheels;Cane - single point Additional Comments: states she has a walker and cane but doesn't use it normally as the house is too narrow.    Prior Function Level of Independence: Needs assistance   Gait / Transfers Assistance Needed: walks occasionally with either use of walls/furniture or AD  ADL's / Homemaking Assistance Needed: dresses independently, son's girlfriend assists with cooking, unable to drive due to vision  Comments: Patient is legally blind but per her report is independent at baseline, reports increasing challenge the past few weeks with performing ADLs     Hand Dominance        Extremity/Trunk Assessment   Upper Extremity Assessment Upper Extremity Assessment: Defer to OT evaluation    Lower Extremity Assessment Lower Extremity Assessment: Generalized weakness (grossly 3+/5; patient challenged with fatigue against resistance)       Communication   Communication: HOH  Cognition Arousal/Alertness: Awake/alert Behavior During Therapy: WFL for tasks assessed/performed Overall Cognitive Status: Within Functional Limits for tasks assessed                                 General Comments: Patient A and O  to self and location but has a hard time with history and home set up , is hard of hearing but able to follow conversation      General Comments General comments (skin integrity, edema, etc.): patient appears well groomed, slightly frail.    Exercises Other Exercises Other Exercises: Patient educated on role of PT in acute care setting, safe mobility and transfers, and decreased fall risk. Assisted in brushing of hair and drinking of water in seated position.   Assessment/Plan    PT Assessment Patient needs continued PT services  PT Problem List Decreased strength;Decreased activity tolerance;Decreased balance;Decreased knowledge of use of DME;Decreased mobility;Cardiopulmonary status limiting  activity       PT Treatment Interventions DME instruction;Gait training;Stair training;Functional mobility training;Neuromuscular re-education;Balance training;Therapeutic exercise;Therapeutic activities;Cognitive remediation;Patient/family education;Manual techniques    PT Goals (Current goals can be found in the Care Plan section)  Acute Rehab PT Goals Patient Stated Goal: to get stronger PT Goal Formulation: With patient Time For Goal Achievement: 11/09/20 Potential to Achieve Goals: Fair    Frequency Min 2X/week   Barriers to discharge Inaccessible home environment;Decreased caregiver support Patient will requires 24/7 care and rehab.    Co-evaluation               AM-PAC PT "6 Clicks" Mobility  Outcome Measure Help needed turning from your back to your side while in a flat bed without using bedrails?: None Help needed moving from lying on your back to sitting on the side of a flat bed without using bedrails?: A Little Help needed moving to and from a bed to a chair (including a wheelchair)?: A Lot Help needed standing up from a chair using your arms (e.g., wheelchair or bedside chair)?: A Lot Help needed to walk in hospital room?: A Lot Help needed climbing 3-5 steps with a railing? : Total 6 Click Score: 14    End of Session Equipment Utilized During  Treatment: Gait belt;Oxygen (2 L of 02 via nasal cannula) Activity Tolerance: Patient tolerated treatment well Patient left: with call bell/phone within reach;with bed alarm set;in bed Nurse Communication: Mobility status PT Visit Diagnosis: Unsteadiness on feet (R26.81);Other abnormalities of gait and mobility (R26.89);Muscle weakness (generalized) (M62.81);Difficulty in walking, not elsewhere classified (R26.2)    Time: 1448-1856 PT Time Calculation (min) (ACUTE ONLY): 24 min   Charges:   PT Evaluation $PT Eval Moderate Complexity: 1 Mod PT Treatments $Therapeutic Activity: 23-37 mins       Precious Bard,  PT, DPT  10/26/2020, 4:25 PM

## 2020-10-26 NOTE — Progress Notes (Signed)
Pt had an incontinent smear/small BM this am, mucous/tan in color, not enough to send to lab for specimen.

## 2020-10-27 LAB — GLUCOSE, CAPILLARY
Glucose-Capillary: 180 mg/dL — ABNORMAL HIGH (ref 70–99)
Glucose-Capillary: 196 mg/dL — ABNORMAL HIGH (ref 70–99)
Glucose-Capillary: 156 mg/dL — ABNORMAL HIGH (ref 70–99)
Glucose-Capillary: 159 mg/dL — ABNORMAL HIGH (ref 70–99)

## 2020-10-27 LAB — CBC
HCT: 32.1 % — ABNORMAL LOW (ref 36.0–46.0)
Hemoglobin: 10.4 g/dL — ABNORMAL LOW (ref 12.0–15.0)
MCH: 30.1 pg (ref 26.0–34.0)
MCHC: 32.4 g/dL (ref 30.0–36.0)
MCV: 93 fL (ref 80.0–100.0)
Platelets: 389 10*3/uL (ref 150–400)
RBC: 3.45 MIL/uL — ABNORMAL LOW (ref 3.87–5.11)
RDW: 13.8 % (ref 11.5–15.5)
WBC: 13.1 10*3/uL — ABNORMAL HIGH (ref 4.0–10.5)
nRBC: 0 % (ref 0.0–0.2)

## 2020-10-27 MED ORDER — CEFDINIR 300 MG PO CAPS
300.0000 mg | ORAL_CAPSULE | Freq: Two times a day (BID) | ORAL | Status: DC
  Administered 2020-10-27 – 2020-10-28 (×3): 300 mg via ORAL
  Filled 2020-10-27 (×4): qty 1

## 2020-10-27 NOTE — NC FL2 (Signed)
[place] MEDICAID FL2 LEVEL OF CARE SCREENING TOOL     IDENTIFICATION  Patient Name: Judy Harvey Birthdate: Sep 28, 1927 Sex: female Admission Date (Current Location): 10/25/2020  [facility] and [place] Number:  Chiropodist and Address:  [facility], [address] [place], [place] [address]      Provider Number: [id number]  Attending Physician Name and Address:  Enedina Finner, MD  Relative Name and Phone Number:       Current Level of Care: Hospital Recommended Level of Care: Skilled Nursing Facility Prior Approval Number:    Date Approved/Denied:   PASRR Number: [id number]  Discharge Plan: SNF    Current Diagnoses: Patient Active Problem List   Diagnosis Date Noted   Community acquired pneumonia 10/25/2020   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) 08/21/2018   Low vitamin B12 level 04/26/2018   Iron deficiency anemia 03/28/2018   Frequent PVCs 03/21/2018   PSVT (paroxysmal supraventricular tachycardia) (HCC) 03/21/2018   Normocytic anemia 02/28/2018   Benign essential HTN 02/20/2018   SOBOE (shortness of breath on exertion) 02/20/2018   Carotid stenosis, symptomatic w/o infarct, left 10/18/2017   TIA (transient ischemic attack) 06/16/2017   Bilateral carotid artery stenosis 06/16/2017   Hyperlipidemia 06/16/2017   Obesity (BMI 30.0-34.9) 05/02/2016   Vitamin D deficiency 03/25/2016   Type 2 diabetes mellitus without complication, with long-term current use of insulin (HCC) 03/24/2016   Old cerebrovascular accident (CVA) without late effect 03/24/2016   Hypertension 03/24/2016   Legally blind 03/24/2016   Macular degeneration 03/24/2016   Constipation 03/24/2016   History of esophageal stricture 03/24/2016   Depression with anxiety 03/24/2016   Restless leg syndrome 03/24/2016   Postherpetic neuralgia 03/24/2016   Gait abnormality 03/24/2016    Orientation RESPIRATION BLADDER Height & Weight     Self,  Time, Situation, Place  O2 ([place] 2L) Incontinent Weight: 199 lb 1.2 oz (90.3 kg) Height:  5\' 2"  (157.5 cm)  BEHAVIORAL SYMPTOMS/MOOD NEUROLOGICAL BOWEL NUTRITION STATUS      Incontinent Diet  AMBULATORY STATUS COMMUNICATION OF NEEDS Skin   Limited Assist Verbally Normal                       Personal Care Assistance Level of Assistance  Dressing, Feeding, Bathing Bathing Assistance: Limited assistance Feeding assistance: Independent Dressing Assistance: Limited assistance     Functional Limitations Info  Sight, Speech, Hearing Sight Info: Adequate Hearing Info: Adequate Speech Info: Adequate    SPECIAL CARE FACTORS FREQUENCY  PT (By licensed PT), OT (By licensed OT)     PT Frequency: 5x OT Frequency: 5x            Contractures Contractures Info: Not present    Additional Factors Info  Code Status, Allergies Code Status Info: partial Allergies Info: Adhesive (Tape), Lyrica (Pregabalin)           Current Medications (10/27/2020):  This is the current hospital active medication list Current Facility-Administered Medications  Medication Dose Route Frequency Provider Last Rate Last Admin   0.9 %  sodium chloride infusion   Intravenous PRN Cox, Amy N, DO   Stopped at 10/26/20 2337   acetaminophen (TYLENOL) tablet 1,000 mg  1,000 mg Oral Q6H PRN Cox, Amy N, DO       Or   acetaminophen (TYLENOL) suppository 650 mg  650 mg Rectal Q6H PRN Cox, Amy N, DO       albuterol (PROVENTIL) (2.5 MG/3ML) 0.083% nebulizer solution 2.5  mg  2.5 mg Nebulization Q6H PRN Cox, Amy N, DO       amLODipine (NORVASC) tablet 5 mg  5 mg Oral Daily Cox, Amy N, DO   5 mg at 10/26/20 6073   aspirin EC tablet 81 mg  81 mg Oral Daily Cox, Amy N, DO   81 mg at 10/26/20 0859   atorvastatin (LIPITOR) tablet 80 mg  80 mg Oral QHS Cox, Amy N, DO   80 mg at 10/26/20 2111   azithromycin (ZITHROMAX) 500 mg in sodium chloride 0.9 % 250 mL IVPB  500 mg Intravenous Q24H Cox, Amy N, DO   Stopping Infusion hung  by another clincian at 10/26/20 2150   cefTRIAXone (ROCEPHIN) 1 g in sodium chloride 0.9 % 100 mL IVPB  1 g Intravenous Q24H Cox, Amy N, DO   Stopping Infusion hung by another clincian at 10/26/20 2035   cholecalciferol (VITAMIN D3) tablet 5,000 Units  5,000 Units Oral Daily Cox, Amy N, DO   5,000 Units at 10/26/20 1229   clopidogrel (PLAVIX) tablet 75 mg  75 mg Oral Daily Cox, Amy N, DO   75 mg at 10/26/20 0859   diltiazem (CARDIZEM) injection 10 mg  10 mg Intravenous Q4H PRN Cox, Amy N, DO       DULoxetine (CYMBALTA) DR capsule 30 mg  30 mg Oral Daily Cox, Amy N, DO   30 mg at 10/26/20 0858   enoxaparin (LOVENOX) injection 40 mg  40 mg Subcutaneous Q24H Cox, Amy N, DO   40 mg at 10/26/20 2112   ferrous sulfate tablet 325 mg  325 mg Oral Daily Cox, Amy N, DO   325 mg at 10/26/20 0858   gabapentin (NEURONTIN) capsule 300 mg  300 mg Oral TID Cox, Amy N, DO   300 mg at 10/26/20 2111   insulin aspart (novoLOG) injection 0-15 Units  0-15 Units Subcutaneous TID WC Cox, Amy N, DO   3 Units at 10/27/20 0759   insulin aspart (novoLOG) injection 0-5 Units  0-5 Units Subcutaneous QHS Cox, Amy N, DO   3 Units at 10/25/20 2257   insulin glargine-yfgn (SEMGLEE) injection 38 Units  38 Units Subcutaneous QHS Cox, Amy N, DO   38 Units at 10/26/20 2111   lisinopril (ZESTRIL) tablet 40 mg  40 mg Oral Daily Cox, Amy N, DO   40 mg at 10/26/20 0859   metFORMIN (GLUCOPHAGE-XR) 24 hr tablet 1,000 mg  1,000 mg Oral Q supper Cox, Amy N, DO       ondansetron (ZOFRAN) tablet 4 mg  4 mg Oral Q6H PRN Cox, Amy N, DO       Or   ondansetron (ZOFRAN) injection 4 mg  4 mg Intravenous Q6H PRN Cox, Amy N, DO   4 mg at 10/26/20 0127   promethazine (PHENERGAN) 12.5 mg in sodium chloride 0.9 % 50 mL IVPB  12.5 mg Intravenous Q8H PRN Cox, Amy N, DO         Discharge Medications: Please see discharge summary for a list of discharge medications.  Relevant Imaging Results:  Relevant Lab Results:   Additional  Information SSN:[id number]  Maree Krabbe, LCSW

## 2020-10-27 NOTE — Evaluation (Signed)
Occupational Therapy Evaluation Patient Details Name: Judy Harvey MRN: [id number] DOB: 17-Jun-1927 Today's Date: 10/27/2020    History of Present Illness Patient is a 87 year old female who presents with two weeks of shortness of breath and cough. Patient went to [facility] with family two weeks prior and came back with covid on 8/3. PMH includes depression, anxiety, HLD, insulin dependent DM, CAD, restless leg, stroke, macular degeneration, peripheral neuropathy, post herpetic neuralgia, legally blind, major depressive disorder, and TIA.   Clinical Impression   Patient presenting with decreased I in self care, balance, functional mobility/transfers, endurance, and safety awareness. Patient is legally blind at baseline and only sees shadows and shapes. Pt reports living with son who assists with IADLs and appointments. She uses SPC or more frequently furniture walks PTA. Pt on 2 L O2 when entering the room and placed on RA for session. Pt desaturated to 85% with activity and placed back on 1 L at end of session with saturation at or above 93% for the rest of session. Pt standing with mod A but having difficulty following commands for side stepping along bed and instead steps forward to hold onto sink and counter. Pt have LOB posteriorly when stepping back towards bed needing mod-max A to correct. She fatigues very quickly during session. Patient will benefit from acute OT to increase overall independence in the areas of ADLs, functional mobility, and safety awareness in order to safely discharge to next venue of care.     Follow Up Recommendations  SNF;Supervision/Assistance - 24 hour    Equipment Recommendations  Other (comment) (defer to next venue of care)       Precautions / Restrictions Precautions Precautions: Fall Precaution Comments: vision, weakness, Restrictions Weight Bearing Restrictions: No      Mobility Bed Mobility Overal bed mobility: Needs Assistance Bed Mobility:  Supine to Sit;Sit to Supine     Supine to sit: Min assist Sit to supine: Min assist        Transfers Overall transfer level: Needs assistance Equipment used: 1 person hand held assist Transfers: Sit to/from [employer] Sit to Stand: Min assist Stand pivot transfers: Mod assist            Balance Overall balance assessment: Needs assistance Sitting-balance support: Bilateral upper extremity supported;Feet supported Sitting balance-Leahy Scale: Good     Standing balance support: Bilateral upper extremity supported;During functional activity Standing balance-Leahy Scale: Poor Standing balance comment: mod A for standing balance and UE support                           ADL either performed or assessed with clinical judgement   ADL Overall ADL's : Needs assistance/impaired                                       General ADL Comments: mod A for stand pivot transfer with HHA. Pt needing additional cuing for movement secondary to vision changes. Pt giving good effort but fatiques very quickly.     Vision Baseline Vision/History: Legally blind Patient Visual Report: No change from baseline              Pertinent Vitals/Pain Pain Assessment: No/denies pain     Hand Dominance Right   Extremity/Trunk Assessment Upper Extremity Assessment Upper Extremity Assessment: Generalized weakness   Lower Extremity Assessment Lower Extremity Assessment: Generalized  weakness       Communication Communication Communication: HOH   Cognition Arousal/Alertness: Awake/alert Behavior During Therapy: WFL for tasks assessed/performed Overall Cognitive Status: Within Functional Limits for tasks assessed                                 General Comments: Pt is very pleasant and cooperative              Home Living Family/patient expects to be discharged to:: Private residence Living Arrangements: Children Available Help  at Discharge: Family;Available PRN/intermittently Type of Home: House Home Access: Stairs to enter Entrance Stairs-Number of Steps: 1   Home Layout: Two level;Able to live on main level with bedroom/bathroom     Bathroom Shower/Tub: Producer, television/film/video: Standard     Home Equipment: Environmental consultant - 2 wheels;Cane - single point   Additional Comments: states she has a walker and cane but doesn't use it normally as the house is too narrow.      Prior Functioning/Environment Level of Independence: Needs assistance  Gait / Transfers Assistance Needed: walks occasionally with either use of walls/furniture or AD ADL's / Homemaking Assistance Needed: dresses and bathes independently, son's girlfriend assists with cooking, unable to drive due to vision   Comments: Pt reports being legally blind at baseline        OT Problem List: Decreased strength;Decreased knowledge of use of DME or AE;Decreased activity tolerance;Cardiopulmonary status limiting activity;Decreased safety awareness;Impaired balance (sitting and/or standing);Impaired vision/perception      OT Treatment/Interventions: Self-care/ADL training;Manual therapy;Therapeutic exercise;Patient/family education;Balance training;Therapeutic activities;DME and/or AE instruction;Energy conservation    OT Goals(Current goals can be found in the care plan section) Acute Rehab OT Goals Patient Stated Goal: to get stronger OT Goal Formulation: With patient Time For Goal Achievement: 11/10/20 Potential to Achieve Goals: Good ADL Goals Pt Will Perform Grooming: with min guard assist;standing Pt Will Perform Lower Body Dressing: with min guard assist;sit to/from stand Pt Will Transfer to Toilet: with min guard assist;ambulating Pt Will Perform Toileting - Clothing Manipulation and hygiene: with min guard assist;sit to/from stand  OT Frequency: Min 2X/week   Barriers to D/C:    none known          AM-PAC OT "6 Clicks" Daily  Activity     Outcome Measure Help from another person eating meals?: A Little Help from another person taking care of personal grooming?: A Little Help from another person toileting, which includes using toliet, bedpan, or urinal?: A Lot Help from another person bathing (including washing, rinsing, drying)?: A Lot Help from another person to put on and taking off regular upper body clothing?: A Little Help from another person to put on and taking off regular lower body clothing?: A Lot 6 Click Score: 15   End of Session Nurse Communication: Mobility status  Activity Tolerance: Patient limited by fatigue Patient left: in bed;with call bell/phone within reach;with bed alarm set  OT Visit Diagnosis: Unsteadiness on feet (R26.81);Muscle weakness (generalized) (M62.81)                Time: 1761-6073 OT Time Calculation (min): 21 min Charges:  OT General Charges $OT Visit: 1 Visit OT Evaluation $OT Eval Moderate Complexity: 1 Mod OT Treatments $Self Care/Home Management : 8-22 mins  Jackquline Denmark, MS, OTR/L , CBIS ascom [phone]  10/27/20, 12:41 PM

## 2020-10-27 NOTE — [place] FL2 (Signed)
[place] MEDICAID FL2 LEVEL OF CARE SCREENING TOOL     IDENTIFICATION  Patient Name: Judy Harvey Birthdate: 1927/06/09 Sex: female Admission Date (Current Location): 10/25/2020  [facility] and [place] Number:  Chiropodist and Address:  [facility], [address] [place], [place] [address]      Provider Number: [id number]  Attending Physician Name and Address:  Enedina Finner, MD  Relative Name and Phone Number:       Current Level of Care: Hospital Recommended Level of Care: Skilled Nursing Facility Prior Approval Number:    Date Approved/Denied:   PASRR Number: [id number]  Discharge Plan: SNF    Current Diagnoses: Patient Active Problem List   Diagnosis Date Noted   Community acquired pneumonia 10/25/2020   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) 08/21/2018   Low vitamin B12 level 04/26/2018   Iron deficiency anemia 03/28/2018   Frequent PVCs 03/21/2018   PSVT (paroxysmal supraventricular tachycardia) (HCC) 03/21/2018   Normocytic anemia 02/28/2018   Benign essential HTN 02/20/2018   SOBOE (shortness of breath on exertion) 02/20/2018   Carotid stenosis, symptomatic w/o infarct, left 10/18/2017   TIA (transient ischemic attack) 06/16/2017   Bilateral carotid artery stenosis 06/16/2017   Hyperlipidemia 06/16/2017   Obesity (BMI 30.0-34.9) 05/02/2016   Vitamin D deficiency 03/25/2016   Type 2 diabetes mellitus without complication, with long-term current use of insulin (HCC) 03/24/2016   Old cerebrovascular accident (CVA) without late effect 03/24/2016   Hypertension 03/24/2016   Legally blind 03/24/2016   Macular degeneration 03/24/2016   Constipation 03/24/2016   History of esophageal stricture 03/24/2016   Depression with anxiety 03/24/2016   Restless leg syndrome 03/24/2016   Postherpetic neuralgia 03/24/2016   Gait abnormality 03/24/2016    Orientation RESPIRATION BLADDER Height & Weight     Self,  Time, Situation, Place  O2 (Sanders 2L) Incontinent Weight: 199 lb 1.2 oz (90.3 kg) Height:  5\' 2"  (157.5 cm)  BEHAVIORAL SYMPTOMS/MOOD NEUROLOGICAL BOWEL NUTRITION STATUS      Incontinent Diet (heart healthy/carb modified thin liquids)  AMBULATORY STATUS COMMUNICATION OF NEEDS Skin   Limited Assist Verbally Normal                       Personal Care Assistance Level of Assistance  Dressing, Feeding, Bathing Bathing Assistance: Limited assistance Feeding assistance: Independent Dressing Assistance: Limited assistance     Functional Limitations Info  Hearing, Speech, Sight Sight Info: Adequate Hearing Info: Adequate Speech Info: Adequate    SPECIAL CARE FACTORS FREQUENCY  PT (By licensed PT), OT (By licensed OT)     PT Frequency: 5x OT Frequency: 5x            Contractures Contractures Info: Not present    Additional Factors Info  Code Status, Allergies Code Status Info: Partial Allergies Info: Adhesive (Tape), Lyrica (Pregabalin)           Current Medications (10/27/2020):  This is the current hospital active medication list Current Facility-Administered Medications  Medication Dose Route Frequency Provider Last Rate Last Admin   0.9 %  sodium chloride infusion   Intravenous PRN Cox, Amy N, DO   Stopped at 10/26/20 2337   acetaminophen (TYLENOL) tablet 1,000 mg  1,000 mg Oral Q6H PRN Cox, Amy N, DO       Or   acetaminophen (TYLENOL) suppository 650 mg  650 mg Rectal Q6H PRN Cox, Amy N, DO       albuterol (PROVENTIL) (2.5  MG/3ML) 0.083% nebulizer solution 2.5 mg  2.5 mg Nebulization Q6H PRN Cox, Amy N, DO       amLODipine (NORVASC) tablet 5 mg  5 mg Oral Daily Cox, Amy N, DO   5 mg at 10/27/20 1610   aspirin EC tablet 81 mg  81 mg Oral Daily Cox, Amy N, DO   81 mg at 10/27/20 0942   atorvastatin (LIPITOR) tablet 80 mg  80 mg Oral QHS Cox, Amy N, DO   80 mg at 10/26/20 2111   azithromycin (ZITHROMAX) 500 mg in sodium chloride 0.9 % 250 mL IVPB  500 mg Intravenous  Q24H Cox, Amy N, DO   Stopping Infusion hung by another clincian at 10/26/20 2150   cefdinir (OMNICEF) capsule 300 mg  300 mg Oral Q12H Enedina Finner, MD   300 mg at 10/27/20 1424   cholecalciferol (VITAMIN D3) tablet 5,000 Units  5,000 Units Oral Daily Cox, Amy N, DO   5,000 Units at 10/27/20 9604   clopidogrel (PLAVIX) tablet 75 mg  75 mg Oral Daily Cox, Amy N, DO   75 mg at 10/27/20 0942   diltiazem (CARDIZEM) injection 10 mg  10 mg Intravenous Q4H PRN Cox, Amy N, DO       DULoxetine (CYMBALTA) DR capsule 30 mg  30 mg Oral Daily Cox, Amy N, DO   30 mg at 10/27/20 0942   enoxaparin (LOVENOX) injection 40 mg  40 mg Subcutaneous Q24H Cox, Amy N, DO   40 mg at 10/26/20 2112   ferrous sulfate tablet 325 mg  325 mg Oral Daily Cox, Amy N, DO   325 mg at 10/27/20 0942   gabapentin (NEURONTIN) capsule 300 mg  300 mg Oral TID Cox, Amy N, DO   300 mg at 10/27/20 0942   insulin aspart (novoLOG) injection 0-15 Units  0-15 Units Subcutaneous TID WC Cox, Amy N, DO   3 Units at 10/27/20 1159   insulin aspart (novoLOG) injection 0-5 Units  0-5 Units Subcutaneous QHS Cox, Amy N, DO   3 Units at 10/25/20 2257   insulin glargine-yfgn (SEMGLEE) injection 38 Units  38 Units Subcutaneous QHS Cox, Amy N, DO   38 Units at 10/26/20 2111   lisinopril (ZESTRIL) tablet 40 mg  40 mg Oral Daily Cox, Amy N, DO   40 mg at 10/27/20 5409   metFORMIN (GLUCOPHAGE-XR) 24 hr tablet 1,000 mg  1,000 mg Oral Q supper Cox, Amy N, DO       ondansetron (ZOFRAN) tablet 4 mg  4 mg Oral Q6H PRN Cox, Amy N, DO       Or   ondansetron (ZOFRAN) injection 4 mg  4 mg Intravenous Q6H PRN Cox, Amy N, DO   4 mg at 10/26/20 0127   promethazine (PHENERGAN) 12.5 mg in sodium chloride 0.9 % 50 mL IVPB  12.5 mg Intravenous Q8H PRN Cox, Amy N, DO         Discharge Medications: Please see discharge summary for a list of discharge medications.  Relevant Imaging Results:  Relevant Lab Results:   Additional Information SSN:[id number]  Maree Krabbe, LCSW

## 2020-10-27 NOTE — TOC Initial Note (Signed)
Transition of Care [facility]) - Initial/Assessment Note    Patient Details  Name: Judy Harvey MRN: [id number] Date of Birth: 08-07-1927  Transition of Care [facility]) CM/SW Contact:    Maree Krabbe, LCSW Phone Number: 10/27/2020, 3:44 PM  Clinical Narrative:   CSW spoke to pt and pt is agreeable to SNF placement. Pt states she would prefer close to her home. CSW read the SNF's that were close to [facility] including [facility] in [place]. CSW then read the ratings ff the Medicare list and pt chooses [facility]. CSW sent referral.                Expected Discharge Plan: Skilled Nursing Facility Barriers to Discharge: Continued Medical Work up   Patient Goals and CMS Choice Patient states their goals for this hospitalization and ongoing recovery are:: to get better   Choice offered to / list presented to : Patient  Expected Discharge Plan and Services Expected Discharge Plan: Skilled Nursing Facility In-house Referral: Clinical Social Work   Post Acute Care Choice: Skilled Nursing Facility Living arrangements for the past 2 months: Single Family Home                                      Prior Living Arrangements/Services Living arrangements for the past 2 months: Single Family Home Lives with:: Adult Children Patient language and need for interpreter reviewed:: Yes Do you feel safe going back to the place where you live?: Yes      Need for Family Participation in Patient Care: Yes (Comment) Care giver support system in place?: Yes (comment)   Criminal Activity/Legal Involvement Pertinent to Current Situation/Hospitalization: No - Comment as needed  Activities of Daily Living Home Assistive Devices/Equipment: None ADL Screening (condition at time of admission) Patient's cognitive ability adequate to safely complete daily activities?: Yes Is the patient deaf or have difficulty hearing?: Yes Does the patient have difficulty seeing, even when wearing glasses/contacts?: Yes Does the  patient have difficulty concentrating, remembering, or making decisions?: No Patient able to express need for assistance with ADLs?: Yes Does the patient have difficulty dressing or bathing?: No Independently performs ADLs?: Yes (appropriate for developmental age) Does the patient have difficulty walking or climbing stairs?: Yes Weakness of Legs: Both Weakness of Arms/Hands: None  Permission Sought/Granted Permission sought to share information with : Family Supports    Share Information with NAME: Loraine Leriche  Permission granted to share info w AGENCY: [facility]  Permission granted to share info w Relationship: son     Emotional Assessment Appearance:: Appears stated age Attitude/Demeanor/Rapport: Engaged Affect (typically observed): Accepting, Appropriate Orientation: : Oriented to Situation, Oriented to  Time, Oriented to Place, Oriented to Self Alcohol / Substance Use: Not Applicable Psych Involvement: No (comment)  Admission diagnosis:  SOB (shortness of breath) [R06.02] Right lower lobe pneumonia [J18.9] Acute respiratory failure with hypoxia (HCC) [J96.01] Non-intractable vomiting with nausea, unspecified vomiting type [R11.2] Community acquired pneumonia, unspecified laterality [J18.9] Patient Active Problem List   Diagnosis Date Noted   Community acquired pneumonia 10/25/2020   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) 08/21/2018   Low vitamin B12 level 04/26/2018   Iron deficiency anemia 03/28/2018   Frequent PVCs 03/21/2018   PSVT (paroxysmal supraventricular tachycardia) (HCC) 03/21/2018   Normocytic anemia 02/28/2018   Benign essential HTN 02/20/2018   SOBOE (shortness of breath on exertion) 02/20/2018   Carotid stenosis, symptomatic w/o infarct, left  10/18/2017   TIA (transient ischemic attack) 06/16/2017   Bilateral carotid artery stenosis 06/16/2017   Hyperlipidemia 06/16/2017   Obesity (BMI 30.0-34.9) 05/02/2016   Vitamin D deficiency 03/25/2016   Type 2  diabetes mellitus without complication, with long-term current use of insulin (HCC) 03/24/2016   Old cerebrovascular accident (CVA) without late effect 03/24/2016   Hypertension 03/24/2016   Legally blind 03/24/2016   Macular degeneration 03/24/2016   Constipation 03/24/2016   History of esophageal stricture 03/24/2016   Depression with anxiety 03/24/2016   Restless leg syndrome 03/24/2016   Postherpetic neuralgia 03/24/2016   Gait abnormality 03/24/2016   PCP:  Mick Sell, MD Pharmacy:   [facility] [address], [place] - [address] [place] [address] Phone: [phone] Fax: [phone]     Social Determinants of Health (SDOH) Interventions    Readmission Risk Interventions No flowsheet data found.

## 2020-10-27 NOTE — Progress Notes (Signed)
[facility]  - [facility]   PATIENT NAME: Judy Harvey    MR#:  [id number]  DATE OF BIRTH:  05/10/1927  SUBJECTIVE:  patient came in from home with generalized weakness poor appetite shortness of breath and cough. She was diagnosed with COVID in early part of August. Had white count of 26,000 found to have right lower lobe pneumonia  C/o loss of taste and poor appetite. Although tells me her cough is better. No fever.  REVIEW OF SYSTEMS:   Review of Systems  Constitutional:  Negative for chills, fever and weight loss.  HENT:  Positive for hearing loss. Negative for ear discharge, ear pain and nosebleeds.        Legally blind  Eyes:  Negative for blurred vision, pain and discharge.  Respiratory:  Positive for cough and sputum production. Negative for shortness of breath, wheezing and stridor.   Cardiovascular:  Negative for chest pain, palpitations, orthopnea and PND.  Gastrointestinal:  Negative for abdominal pain, diarrhea, nausea and vomiting.  Genitourinary:  Negative for frequency and urgency.  Musculoskeletal:  Negative for back pain and joint pain.  Neurological:  Positive for weakness. Negative for sensory change, speech change and focal weakness.  Psychiatric/Behavioral:  Negative for depression and hallucinations. The patient is not nervous/anxious.   Tolerating Diet:yes Tolerating PT: SNF  DRUG ALLERGIES:   Allergies  Allergen Reactions  . Adhesive [Tape] Other (See Comments)    "pulls my skin off" paper tape ok  . Lyrica [Pregabalin]     Body spasms    VITALS:  Blood pressure (!) 138/121, pulse 78, temperature 97.6 F (36.4 C), resp. rate 18, height 5\' 2"  (1.575 m), weight 90.3 kg, SpO2 98 %.  PHYSICAL EXAMINATION:   Physical Exam  GENERAL:  87 y.o.-year-old patient lying in the bed with no acute distress.  HEENT: legally blind, HOH LUNGS: decreased breath sounds bilaterally, no wheezing, rales, rhonchi. No use of accessory  muscles of respiration.  CARDIOVASCULAR: S1, S2 normal. No murmurs, rubs, or gallops.  ABDOMEN: Soft, nontender, nondistended. Bowel sounds present. EXTREMITIES: No cyanosis, clubbing or edema b/l.    NEUROLOGIC:non focal  PSYCHIATRIC:  patient is alert and awake  SKIN: No obvious rash, lesion, or ulcer.   LABORATORY PANEL:  CBC Recent Labs  Lab 10/27/20 0621  WBC 13.1*  HGB 10.4*  HCT 32.1*  PLT 389     Chemistries  Recent Labs  Lab 10/26/20 0540  NA 137  K 3.6  CL 99  CO2 30  GLUCOSE 209*  BUN 16  CREATININE 0.94  CALCIUM 8.4*    Cardiac Enzymes No results for input(s): TROPONINI in the last 168 hours. RADIOLOGY:  DG Chest 2 View  Result Date: 10/25/2020 CLINICAL DATA:  Nausea, vomiting, diarrhea for 1 week. EXAM: CHEST - 2 VIEW COMPARISON:  Chest radiograph dated 03/05/2016. FINDINGS: The heart size is normal. Vascular calcifications are seen in the aortic arch. Mild chronic interstitial opacities, right greater than left, are noted. There is no pleural effusion or pneumothorax. The visualized skeletal structures are unremarkable. IMPRESSION: Mild chronic appearing interstitial opacities. Aortic Atherosclerosis (ICD10-I70.0). Electronically Signed   By: 03/07/2016 M.D.   On: 10/25/2020 15:09   CT Abdomen Pelvis W Contrast  Result Date: 10/25/2020 CLINICAL DATA:  Acute abdominal pain EXAM: CT ABDOMEN AND PELVIS WITH CONTRAST TECHNIQUE: Multidetector CT imaging of the abdomen and pelvis was performed using the standard protocol following bolus administration of intravenous contrast. CONTRAST:  24mL OMNIPAQUE IOHEXOL  350 MG/ML SOLN COMPARISON:  11/06/2018 FINDINGS: Lower chest: Left lung base is within normal limits. Small right-sided effusion and patchy right lower lobe pneumonia is seen. Hepatobiliary: No focal liver abnormality is seen. No gallstones, gallbladder wall thickening, or biliary dilatation. Pancreas: A few small simple appearing cysts are noted within the  head of the pancreas. No inflammatory changes are noted. Spleen: Spleen is within normal limits. There is a central calcification seen stable from the prior exam consistent with a calcified splenic aneurysm. It measures 8 mm in dimension. Adrenals/Urinary Tract: Adrenal glands are unremarkable. Stable right renal cysts are seen. Normal excretion of contrast is noted bilaterally. Scattered nonobstructing left upper pole renal stones are seen. No obstructive changes are noted. The bladder is well distended with opacified urine. Stomach/Bowel: Scattered diverticular change of the colon is noted. No obstructive or inflammatory changes are seen. The appendix is not well visualized consistent with the prior surgical history. Small bowel is unremarkable. Stomach demonstrates a moderate-sized sliding-type hiatal hernia. Vascular/Lymphatic: Aortic atherosclerosis. No enlarged abdominal or pelvic lymph nodes. Reproductive: Uterus and bilateral adnexa are unremarkable. Other: No abdominal wall hernia or abnormality. No abdominopelvic ascites. Musculoskeletal: No acute or significant osseous findings. Chronic T12 compression deformity is noted. IMPRESSION: Right-sided pneumonia with associated effusion. Scattered nonobstructing left renal stones. Diverticulosis without diverticulitis. Hiatal hernia. Stable calcified splenic artery aneurysm. Electronically Signed   By: Alcide Clever M.D.   On: 10/25/2020 18:06   ASSESSMENT AND PLAN:  Judy Harvey is a 87 y.o. female with medical history significant for depression, anxiety, hyperlipidemia, insulin-dependent diabetes mellitus, CAD, hyperlipidemia, presents to the emergency department for chief concerns of nausea vomiting and shortness of breath.   New right lower lobe pneumonia - MRSA PCR negative --GI pcr negative - Cont azithromycin and ceftriaxone --change to po abx - COVID PCR is negative  --wbc improving down to 13K   Nausea and vomiting and diarrhea suspect  secondary to gastroenteritis in setting of pneumonia versus recent COVID infection - Symptomatic support with ondansetron  -- patient received IV fluids  - GI panel negative , C. difficile PCR negative    Depression/anxiety-resumed home duloxetine    Insulin-dependent diabetes mellitus -- sliding scale insulin and Lantus  Hypertension - resumed Norvasc, diltiazem, lisinopril   Hyperlipidemia - cont atorvastatin     CAD- cont Plavix 75 mg daily   generalized weakness legally blind both eyes -- seen by physical therapy. Recommends rehab. D/w pt and she will consider it   Procedures: Family communication :son mark on the phone 8/16 Consults : none CODE STATUS: PArtial DVT Prophylaxis :lovenox Level of care: Med-Surg Status is: Inpatient  Remains inpatient appropriate because:Inpatient level of care appropriate due to severity of illness  Dispo: The patient is from: Home              Anticipated d/c is to: SNF in pt agrees              Patient currently improving and OK to d/c to rehab when bed opens up   Difficult to place patient No        TOTAL TIME TAKING CARE OF THIS PATIENT: 25 minutes.  >50% time spent on counselling and coordination of care  Note: This dictation was prepared with Dragon dictation along with smaller phrase technology. Any transcriptional errors that result from this process are unintentional.  Enedina Finner M.D    [facility]   CC: Primary care physician; Mick Sell, MD Patient ID:  Judy Harvey, female   DOB: 07/11/1927, 87 y.o.   MRN: [id number]

## 2020-10-28 DIAGNOSIS — J9601 Acute respiratory failure with hypoxia: Secondary | ICD-10-CM

## 2020-10-28 LAB — RESP PANEL BY RT-PCR (FLU A&B, COVID) ARPGX2
Influenza A by PCR: NEGATIVE
Influenza B by PCR: NEGATIVE
SARS Coronavirus 2 by RT PCR: POSITIVE — AB

## 2020-10-28 LAB — GLUCOSE, CAPILLARY
Glucose-Capillary: 126 mg/dL — ABNORMAL HIGH (ref 70–99)
Glucose-Capillary: 185 mg/dL — ABNORMAL HIGH (ref 70–99)

## 2020-10-28 MED ORDER — INSULIN GLARGINE 100 UNIT/ML ~~LOC~~ SOLN
42.0000 [IU] | Freq: Every day | SUBCUTANEOUS | 5 refills | Status: DC
Start: 2020-10-28 — End: 2021-03-19

## 2020-10-28 MED ORDER — CEFDINIR 300 MG PO CAPS: 300.0000 mg | ORAL_CAPSULE | Freq: Two times a day (BID) | ORAL | 0 refills | Status: AC

## 2020-10-28 NOTE — Progress Notes (Signed)
Occupational Therapy Treatment Patient Details Name: Judy Harvey MRN: [id number] DOB: 03/10/1928 Today's Date: 10/28/2020    History of present illness Patient is a 87 year old female who presents with two weeks of shortness of breath and cough. Patient went to [facility] with family two weeks prior and came back with covid on 8/3. PMH includes depression, anxiety, HLD, insulin dependent DM, CAD, restless leg, stroke, macular degeneration, peripheral neuropathy, post herpetic neuralgia, legally blind, major depressive disorder, and TIA.   OT comments  Upon entering the room, pt supine in bed and motivated for OT intervention. Pt reporting feeling like she could get up and walk and go home. Pt performed bed mobility with supervision to EOB. BSC set up next to bed with therapist placing pt's hand on BSC in order to locate/navigate direction of transfer. Pt standing and transferred to [facility] with min A. Pt able to void and performs hygiene with set up A and min A for standing balance. Pt ambulating short distance with Min HHA and furniture walking. Pt reports this is the only way she does it at home. Pt seated in recliner chair and required set up A to brush teeth. All needed items within reach and chair alarm activated. OT reviewed with pt how to locate RN call bell and pt returned demonstration. Pt continues to benefit from acute OT intervention and recommendation for SNF to continue to address functional deficits.   Follow Up Recommendations  SNF;Supervision/Assistance - 24 hour    Equipment Recommendations  Other (comment) (defer to next venue of care)       Precautions / Restrictions Precautions Precautions: Fall Precaution Comments: vision, weakness,       Mobility Bed Mobility Overal bed mobility: Needs Assistance Bed Mobility: Supine to Sit     Supine to sit: Supervision;HOB elevated     General bed mobility comments: use of bed rails and min cuing but no physical assist to  EOB    Transfers Overall transfer level: Needs assistance Equipment used: 1 person hand held assist Transfers: Sit to/from [employer] Sit to Stand: Min assist Stand pivot transfers: Min assist       General transfer comment: min A for balance. direct cuing, and assist with hand placement for pt to locate important items secondary to blindness    Balance Overall balance assessment: Needs assistance Sitting-balance support: Bilateral upper extremity supported;Feet supported Sitting balance-Leahy Scale: Good     Standing balance support: Bilateral upper extremity supported;During functional activity Standing balance-Leahy Scale: Fair                             ADL either performed or assessed with clinical judgement   ADL Overall ADL's : Needs assistance/impaired                         Toilet Transfer: Minimal assistance;BSC   Toileting- Clothing Manipulation and Hygiene: Minimal assistance;Cueing for safety;Sit to/from stand               Vision Baseline Vision/History: Legally blind Patient Visual Report: No change from baseline            Cognition Arousal/Alertness: Awake/alert Behavior During Therapy: WFL for tasks assessed/performed Overall Cognitive Status: Within Functional Limits for tasks assessed  General Comments: Pt is very motivated this session.                   Pertinent Vitals/ Pain       Pain Assessment: No/denies pain   Frequency  Min 2X/week        Progress Toward Goals  OT Goals(current goals can now be found in the care plan section)  Progress towards OT goals: Progressing toward goals  Acute Rehab OT Goals Patient Stated Goal: to get stronger OT Goal Formulation: With patient Time For Goal Achievement: 11/10/20 Potential to Achieve Goals: Good  Plan Discharge plan remains appropriate       AM-PAC OT "6 Clicks" Daily Activity      Outcome Measure   Help from another person eating meals?: A Little Help from another person taking care of personal grooming?: A Little Help from another person toileting, which includes using toliet, bedpan, or urinal?: A Little Help from another person bathing (including washing, rinsing, drying)?: A Little Help from another person to put on and taking off regular upper body clothing?: A Little Help from another person to put on and taking off regular lower body clothing?: A Little 6 Click Score: 18    End of Session    OT Visit Diagnosis: Unsteadiness on feet (R26.81);Muscle weakness (generalized) (M62.81)   Activity Tolerance Patient tolerated treatment well   Patient Left with call bell/phone within reach;in chair;with chair alarm set   Nurse Communication Mobility status        Time: 7893-8101 OT Time Calculation (min): 39 min  Charges: OT General Charges $OT Visit: 1 Visit OT Treatments $Self Care/Home Management : 38-52 mins  Jackquline Denmark, MS, OTR/L , CBIS ascom [phone]  10/28/20, 12:59 PM

## 2020-10-28 NOTE — Progress Notes (Signed)
AVS placed in discharge packet. Report called to [facility] and given to [employer]. NAD noted or voiced concerns pt transported via EMS at this time.

## 2020-10-28 NOTE — TOC Progression Note (Signed)
Transition of Care [facility]) - Progression Note    Patient Details  Name: Judy Harvey MRN: [id number] Date of Birth: 10-29-1927  Transition of [facility]) CM/SW Contact  Maree Krabbe, LCSW Phone Number: 10/28/2020, 10:24 AM  Clinical Narrative:   [name] started for [facility].    Expected Discharge Plan: Skilled Nursing Facility Barriers to Discharge: Continued Medical Work up  Expected Discharge Plan and Services Expected Discharge Plan: Skilled Nursing Facility In-house Referral: Clinical Social Work   Post Acute Care Choice: Skilled Nursing Facility Living arrangements for the past 2 months: Single Family Home                                       Social Determinants of Health (SDOH) Interventions    Readmission Risk Interventions No flowsheet data found.

## 2020-10-28 NOTE — Progress Notes (Signed)
Mobility Specialist - Progress Note   10/28/20 1136  Mobility  Activity Transferred:  Chair to bed  Level of Assistance Minimal assist, patient does 75% or more  Assistive Device None  Distance Ambulated (ft) 3 ft  Mobility Ambulated with assistance in room  Mobility Response Tolerated well  Mobility performed by Mobility specialist  $Mobility charge 1 Mobility    Pt sitting in recliner upon arrival, utilizing RA. Requesting to transfer back to bed for a nap. MinA to stand and pivot transfer back to bed. Supervision to return supine. Pt left in bed with alarm set and needs in reach. Lights dimmed per pt preference.    Filiberto Pinks Mobility Specialist 10/28/20, 11:41 AM

## 2020-10-28 NOTE — Plan of Care (Signed)

## 2020-10-28 NOTE — TOC Transition Note (Signed)
Transition of Care [facility]) - CM/SW Discharge Note   Patient Details  Name: Judy Harvey MRN: [id number] Date of Birth: 1928-01-31  Transition of Care [facility]) CM/SW Contact:  Maree Krabbe, LCSW Phone Number: 10/28/2020, 1:52 PM   Clinical Narrative:   Clinical Social Worker facilitated patient discharge including contacting patient family and facility to confirm patient discharge plans.  Clinical information faxed to facility and family agreeable with plan.  CSW arranged ambulance transport via [facility] to [facility] .  RN to call [phone] for report prior to discharge.   Final next level of care: Skilled Nursing Facility Barriers to Discharge: No Barriers Identified   Patient Goals and CMS Choice Patient states their goals for this hospitalization and ongoing recovery are:: to get better   Choice offered to / list presented to : Patient  Discharge Placement              Patient chooses bed at:  ([facility]) Patient to be transferred to facility by: [facility] Name of family member notified: call attempt made to son no answer--left voicemail Patient and family notified of of transfer: 10/28/20  Discharge Plan and Services In-house Referral: Clinical Social Work   Post Acute Care Choice: Skilled Nursing Facility                               Social Determinants of Health (SDOH) Interventions     Readmission Risk Interventions No flowsheet data found.

## 2020-10-28 NOTE — Discharge Summary (Signed)
[facility] [place] [facility]   PATIENT NAME: Judy Harvey    MR#:  [id number]  DATE OF BIRTH:  02/01/1928  DATE OF ADMISSION:  10/25/2020 ADMITTING PHYSICIAN: Amy N Cox, DO  DATE OF DISCHARGE: 8/172022  PRIMARY CARE PHYSICIAN: Mick Sell, MD    ADMISSION DIAGNOSIS:  SOB (shortness of breath) [R06.02] Right lower lobe pneumonia [J18.9] Acute respiratory failure with hypoxia (HCC) [J96.01] Non-intractable vomiting with nausea, unspecified vomiting type [R11.2] Community acquired pneumonia, unspecified laterality [J18.9]  DISCHARGE DIAGNOSIS:  Acute hypoxic respiratory failure due to Community acquired pneumonia  SECONDARY DIAGNOSIS:   Past Medical History:  Diagnosis Date  . Anemia   . Depression   . Diabetes mellitus without complication (HCC)   . GERD (gastroesophageal reflux disease)   . Hyperlipidemia   . Hypertension   . Legally blind   . Macular degeneration, bilateral   . Peripheral neuropathy   . Post herpetic neuralgia    located in her back  . Stroke [facility])     HOSPITAL COURSE:   Judy Harvey is a 87 y.o. female with medical history significant for depression, anxiety, hyperlipidemia, insulin-dependent diabetes mellitus, CAD, hyperlipidemia, presents to the emergency department for chief concerns of nausea vomiting and shortness of breath.    New right lower lobe pneumonia with acute respiratory failure with hypoxia--now resolved - MRSA PCR negative --GI pcr negative -  On azithromycin (completed) and ceftriaxone --change to po abx - COVID PCR is negative  --wbc improving down to 13K -afebrile, sats stable on RA.    Nausea and vomiting and diarrhea suspect secondary to gastroenteritis in setting of pneumonia versus recent COVID infection - Symptomatic support with ondansetron  -- patient received IV fluids  - GI panel negative , C. difficile PCR negative    Depression/anxiety-resumed home duloxetine     Insulin-dependent diabetes mellitus -- sliding scale insulin and Lantus   Hypertension --resumed Norvasc, diltiazem, lisinopril    Hyperlipidemia - cont atorvastatin     CAD - cont Plavix 75 mg daily    generalized weakness legally blind both eyes -- seen by physical therapy. Recommends rehab.  --d/w pt and explained her about rehab. Son is agreeable with it    Procedures: Family communication :son mark on the phone 8/16 Consults : none CODE STATUS: PArtial DVT Prophylaxis :lovenox Level of care: Med-Surg Status is: Inpatient     Dispo: The patient is from: Home              Anticipated d/c is to: SNF in pt agrees              Patient currently improving and OK to d/c to rehab when bed opens up              Difficult to place patient No     CONSULTS OBTAINED:    DRUG ALLERGIES:   Allergies  Allergen Reactions  . Adhesive [Tape] Other (See Comments)    "pulls my skin off" paper tape ok  . Lyrica [Pregabalin]     Body spasms    DISCHARGE MEDICATIONS:   Allergies as of 10/28/2020       Reactions   Adhesive [tape] Other (See Comments)   "pulls my skin off" paper tape ok   Lyrica [pregabalin]    Body spasms        Medication List     TAKE these medications    acetaminophen 325 MG tablet Commonly known as: TYLENOL Take  650 mg by mouth every 6 (six) hours as needed for mild pain or fever.   albuterol 108 (90 Base) MCG/ACT inhaler Commonly known as: VENTOLIN HFA Inhale 2 puffs into the lungs every 6 (six) hours as needed for shortness of breath or wheezing.   albuterol 1.25 MG/3ML nebulizer solution Commonly known as: ACCUNEB Take 3 mLs by nebulization every 6 (six) hours as needed for wheezing or shortness of breath.   amLODipine 5 MG tablet Commonly known as: NORVASC Take 5 mg by mouth daily.   aspirin EC 81 MG tablet Take 81 mg by mouth daily.   atorvastatin 80 MG tablet Commonly known as: LIPITOR TAKE 1 TABLET BY MOUTH EVERYDAY AT  BEDTIME What changed:  how much to take how to take this when to take this additional instructions   cefdinir 300 MG capsule Commonly known as: OMNICEF Take 1 capsule (300 mg total) by mouth every 12 (twelve) hours for 5 days.   clobetasol ointment 0.05 % Commonly known as: TEMOVATE Apply 1 application topically 2 (two) times daily.   clopidogrel 75 MG tablet Commonly known as: PLAVIX Take 1 tablet (75 mg total) by mouth daily.   DULoxetine 30 MG capsule Commonly known as: CYMBALTA TAKE 1 CAPSULE BY MOUTH EVERY DAY What changed:  how much to take how to take this when to take this additional instructions   ferrous sulfate 325 (65 FE) MG tablet Take 325 mg by mouth daily.   gabapentin 300 MG capsule Commonly known as: NEURONTIN Take 600 mg by mouth 3 (three) times daily.   insulin glargine 100 UNIT/ML injection Commonly known as: Lantus Inject 0.42 mLs (42 Units total) into the skin daily. INJECT 50 UNITS INTO THE SKIN DAILY AT 10 PM. What changed:  how much to take how to take this when to take this   Insulin Syringe-Needle U-100 31G X 5/16" 0.3 ML Misc Commonly known as: B-D INS SYR ULTRAFINE .3CC/31G Use to administer insulin daily to control diabetes. 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   lisinopril 40 MG tablet Commonly known as: ZESTRIL Take 40 mg by mouth daily.   metFORMIN 500 MG 24 hr tablet Commonly known as: GLUCOPHAGE-XR Take 1,000 mg by mouth daily with supper.   ondansetron 8 MG tablet Commonly known as: ZOFRAN Take 8 mg by mouth every 8 (eight) hours as needed for nausea or vomiting.   Vitamin D3 125 MCG (5000 UT) Caps Take 1 capsule (5,000 Units total) by mouth daily.        If you experience worsening of your admission symptoms, develop shortness of breath, life threatening emergency, suicidal or homicidal thoughts you must seek medical attention immediately by calling 911 or calling your MD  immediately  if symptoms less severe.  You Must read complete instructions/literature along with all the possible adverse reactions/side effects for all the Medicines you take and that have been prescribed to you. Take any new Medicines after you have completely understood and accept all the possible adverse reactions/side effects.   Please note  You were cared for by a hospitalist during your hospital stay. If you have any questions about your discharge medications or the care you received while you were in the hospital after you are discharged, you can call the unit and asked to speak with the hospitalist on call if the hospitalist that took care of you is not available. Once you are discharged, your primary care physician will handle any further medical issues. Please  note that NO REFILLS for any discharge medications will be authorized once you are discharged, as it is imperative that you return to your primary care physician (or establish a relationship with a primary care physician if you do not have one) for your aftercare needs so that they can reassess your need for medications and monitor your lab values. Today   SUBJECTIVE   I feel ok  VITAL SIGNS:  Blood pressure (!) 155/54, pulse 80, temperature 97.7 F (36.5 C), resp. rate 16, height  (1.575 m), weight 90.3 kg, SpO2 91 %.  I/O:   Intake/Output Summary (Last 24 hours) at 10/28/2020 1233 Last data filed at 10/28/2020 1007 Gross per 24 hour  Intake 300 ml  Output --  Net 300 ml    PHYSICAL EXAMINATION:  GENERAL:  87 y.o.-year-old patient lying in the bed with no acute distress.  EYES: legally blind LUNGS: Normal breath sounds bilaterally, no wheezing, rales,rhonchi or crepitation. No use of accessory muscles of respiration.  CARDIOVASCULAR: S1, S2 normal. No murmurs, rubs, or gallops.  ABDOMEN: Soft, non-tender, non-distended. Bowel sounds present. No organomegaly or mass.  EXTREMITIES: No pedal edema, cyanosis, or  clubbing.  NEUROLOGIC: non focal , HOH PSYCHIATRIC: The patient is alert and oriented x 3.  SKIN: No obvious rash, lesion, or ulcer.   DATA REVIEW:   CBC  Recent Labs  Lab 10/27/20 0621  WBC 13.1*  HGB 10.4*  HCT 32.1*  PLT 389    Chemistries  Recent Labs  Lab 10/26/20 0540  NA 137  K 3.6  CL 99  CO2 30  GLUCOSE 209*  BUN 16  CREATININE 0.94  CALCIUM 8.4*    Microbiology Results   Recent Results (from the past 240 hour(s))  Resp Panel by RT-PCR (Flu A&B, Covid) Nasopharyngeal Swab     Status: None   Collection Time: 10/25/20  6:10 PM   Specimen: Nasopharyngeal Swab; Nasopharyngeal(NP) swabs in vial transport medium  Result Value Ref Range Status   SARS Coronavirus 2 by RT PCR NEGATIVE NEGATIVE Final    Comment: (NOTE) SARS-CoV-2 target nucleic acids are NOT DETECTED.  The SARS-CoV-2 RNA is generally detectable in upper respiratory specimens during the acute phase of infection. The lowest concentration of SARS-CoV-2 viral copies this assay can detect is 138 copies/mL. A negative result does not preclude SARS-Cov-2 infection and should not be used as the sole basis for treatment or other patient management decisions. A negative result may occur with  improper specimen collection/handling, submission of specimen other than nasopharyngeal swab, presence of viral mutation(s) within the areas targeted by this assay, and inadequate number of viral copies(<138 copies/mL). A negative result must be combined with clinical observations, patient history, and epidemiological information. The expected result is Negative.  Fact Sheet for Patients:  BloggerCourse.com  Fact Sheet for Healthcare Providers:  SeriousBroker.it  This test is no t yet approved or cleared by the [place] FDA and  has been authorized for detection and/or diagnosis of SARS-CoV-2 by FDA under an Emergency Use Authorization (EUA). This EUA will  remain  in effect (meaning this test can be used) for the duration of the COVID-19 declaration under Section 564(b)(1) of the Act, 21 U.S.C.section 360bbb-3(b)(1), unless the authorization is terminated  or revoked sooner.       Influenza A by PCR NEGATIVE NEGATIVE Final   Influenza B by PCR NEGATIVE NEGATIVE Final    Comment: (NOTE) The Xpert Xpress SARS-CoV-2/FLU/RSV plus assay is intended as an aid in  the diagnosis of influenza from Nasopharyngeal swab specimens and should not be used as a sole basis for treatment. Nasal washings and aspirates are unacceptable for Xpert Xpress SARS-CoV-2/FLU/RSV testing.  Fact Sheet for Patients: BloggerCourse.comhttps://www.fda.gov/media/152166/download  Fact Sheet for Healthcare Providers: SeriousBroker.ithttps://www.fda.gov/media/152162/download  This test is not yet approved or cleared by the [place] FDA and has been authorized for detection and/or diagnosis of SARS-CoV-2 by FDA under an Emergency Use Authorization (EUA). This EUA will remain in effect (meaning this test can be used) for the duration of the COVID-19 declaration under Section 564(b)(1) of the Act, 21 U.S.C. section 360bbb-3(b)(1), unless the authorization is terminated or revoked.  Performed at [facility], [address] [place], [place] [address]   MRSA Next Gen by PCR, Nasal     Status: None   Collection Time: 10/25/20 10:27 PM   Specimen: Nasal Mucosa; Nasal Swab  Result Value Ref Range Status   MRSA by PCR Next Gen NOT DETECTED NOT DETECTED Final    Comment: (NOTE) The GeneXpert MRSA Assay (FDA approved for NASAL specimens only), is one component of a comprehensive MRSA colonization surveillance program. It is not intended to diagnose MRSA infection nor to guide or monitor treatment for MRSA infections. Test performance is not FDA approved in patients less than 85 years old. Performed at [facility], [address] [place], [place] [address]   C Difficile Quick  Screen w PCR reflex     Status: None   Collection Time: 10/26/20 10:51 AM   Specimen: STOOL  Result Value Ref Range Status   C Diff antigen NEGATIVE NEGATIVE Final   C Diff toxin NEGATIVE NEGATIVE Final   C Diff interpretation No C. difficile detected.  Final    Comment: Performed at [facility], [address] [place], [place] [address]  Gastrointestinal Panel by PCR , Stool     Status: None   Collection Time: 10/26/20 10:51 AM   Specimen: STOOL  Result Value Ref Range Status   Campylobacter species NOT DETECTED NOT DETECTED Final   Plesimonas shigelloides NOT DETECTED NOT DETECTED Final   Salmonella species NOT DETECTED NOT DETECTED Final   Yersinia enterocolitica NOT DETECTED NOT DETECTED Final   Vibrio species NOT DETECTED NOT DETECTED Final   Vibrio cholerae NOT DETECTED NOT DETECTED Final   Enteroaggregative E coli (EAEC) NOT DETECTED NOT DETECTED Final   Enteropathogenic E coli (EPEC) NOT DETECTED NOT DETECTED Final   Enterotoxigenic E coli (ETEC) NOT DETECTED NOT DETECTED Final   Shiga like toxin producing E coli (STEC) NOT DETECTED NOT DETECTED Final   Shigella/Enteroinvasive E coli (EIEC) NOT DETECTED NOT DETECTED Final   Cryptosporidium NOT DETECTED NOT DETECTED Final   Cyclospora cayetanensis NOT DETECTED NOT DETECTED Final   Entamoeba histolytica NOT DETECTED NOT DETECTED Final   Giardia lamblia NOT DETECTED NOT DETECTED Final   Adenovirus F40/41 NOT DETECTED NOT DETECTED Final   Astrovirus NOT DETECTED NOT DETECTED Final   Norovirus GI/GII NOT DETECTED NOT DETECTED Final   Rotavirus A NOT DETECTED NOT DETECTED Final   Sapovirus (I, II, IV, and V) NOT DETECTED NOT DETECTED Final    Comment: Performed at [facility], [address] [place], [place] [address]    RADIOLOGY:  No results found.   CODE STATUS:     Code Status Orders  (From admission, onward)           Start     Ordered   10/25/20 2026  Limited resuscitation (  code)   Continuous       Question Answer Comment  In the event of cardiac or respiratory ARREST: Initiate Code Blue, Call Rapid Response Yes   In the event of cardiac or respiratory ARREST: Perform CPR Yes   In the event of cardiac or respiratory ARREST: Perform Intubation/Mechanical Ventilation No   In the event of cardiac or respiratory ARREST: Use NIPPV/BiPAp only if indicated Yes   In the event of cardiac or respiratory ARREST: Administer ACLS medications if indicated Yes   In the event of cardiac or respiratory ARREST: Perform Defibrillation or Cardioversion if indicated Yes      10/25/20 2025           Code Status History     Date Active Date Inactive Code Status Order ID Comments User Context   10/25/2020 1900 10/25/2020 2025 Full Code [id number]  Lovenia Kim, DO ED   11/24/2019 1746 11/26/2019 2217 DNR [id number]  Charise Killian, MD ED   10/18/2017 1150 10/19/2017 1716 Full Code [id number]  Annice Needy, MD Inpatient      Advance Directive Documentation    Flowsheet Row Most Recent Value  Type of Advance Directive Healthcare Power of Attorney  Pre-existing out of facility DNR order (yellow form or pink MOST form) --  "MOST" Form in Place? --        TOTAL TIME TAKING CARE OF THIS PATIENT: 35 minutes.    Enedina Finner M.D  [facility]    CC: Primary care physician; Mick Sell, MD

## 2021-03-12 ENCOUNTER — Encounter: Payer: Self-pay | Admitting: Intensive Care

## 2021-03-12 ENCOUNTER — Emergency Department: Payer: Medicare Other

## 2021-03-12 ENCOUNTER — Other Ambulatory Visit: Payer: Self-pay

## 2021-03-12 ENCOUNTER — Inpatient Hospital Stay
Admission: EM | Admit: 2021-03-12 | Discharge: 2021-03-19 | DRG: 682 | Disposition: A | Discharge status: Skilled Nursing Facility | Payer: Medicare Other | Attending: Obstetrics and Gynecology | Admitting: Obstetrics and Gynecology

## 2021-03-12 DIAGNOSIS — R339 Retention of urine, unspecified: Secondary | ICD-10-CM | POA: Diagnosis not present

## 2021-03-12 DIAGNOSIS — N179 Acute kidney failure, unspecified: Secondary | ICD-10-CM | POA: Diagnosis not present

## 2021-03-12 DIAGNOSIS — H3530 Unspecified macular degeneration: Secondary | ICD-10-CM | POA: Diagnosis present

## 2021-03-12 DIAGNOSIS — Z20822 Contact with and (suspected) exposure to covid-19: Secondary | ICD-10-CM | POA: Diagnosis present

## 2021-03-12 DIAGNOSIS — Z90710 Acquired absence of both cervix and uterus: Secondary | ICD-10-CM

## 2021-03-12 DIAGNOSIS — E875 Hyperkalemia: Secondary | ICD-10-CM | POA: Diagnosis present

## 2021-03-12 DIAGNOSIS — E860 Dehydration: Secondary | ICD-10-CM | POA: Diagnosis present

## 2021-03-12 DIAGNOSIS — E785 Hyperlipidemia, unspecified: Secondary | ICD-10-CM | POA: Diagnosis present

## 2021-03-12 DIAGNOSIS — R269 Unspecified abnormalities of gait and mobility: Secondary | ICD-10-CM | POA: Diagnosis present

## 2021-03-12 DIAGNOSIS — E119 Type 2 diabetes mellitus without complications: Secondary | ICD-10-CM

## 2021-03-12 DIAGNOSIS — Z6828 Body mass index (BMI) 28.0-28.9, adult: Secondary | ICD-10-CM

## 2021-03-12 DIAGNOSIS — D638 Anemia in other chronic diseases classified elsewhere: Secondary | ICD-10-CM | POA: Diagnosis present

## 2021-03-12 DIAGNOSIS — R338 Other retention of urine: Secondary | ICD-10-CM

## 2021-03-12 DIAGNOSIS — R531 Weakness: Secondary | ICD-10-CM | POA: Diagnosis not present

## 2021-03-12 DIAGNOSIS — I10 Essential (primary) hypertension: Secondary | ICD-10-CM | POA: Diagnosis present

## 2021-03-12 DIAGNOSIS — Z794 Long term (current) use of insulin: Secondary | ICD-10-CM

## 2021-03-12 DIAGNOSIS — Z8673 Personal history of transient ischemic attack (TIA), and cerebral infarction without residual deficits: Secondary | ICD-10-CM

## 2021-03-12 DIAGNOSIS — K219 Gastro-esophageal reflux disease without esophagitis: Secondary | ICD-10-CM | POA: Diagnosis present

## 2021-03-12 DIAGNOSIS — I9589 Other hypotension: Secondary | ICD-10-CM | POA: Diagnosis present

## 2021-03-12 DIAGNOSIS — R112 Nausea with vomiting, unspecified: Secondary | ICD-10-CM | POA: Diagnosis not present

## 2021-03-12 DIAGNOSIS — D509 Iron deficiency anemia, unspecified: Secondary | ICD-10-CM | POA: Diagnosis present

## 2021-03-12 DIAGNOSIS — E861 Hypovolemia: Secondary | ICD-10-CM | POA: Diagnosis not present

## 2021-03-12 DIAGNOSIS — D649 Anemia, unspecified: Secondary | ICD-10-CM | POA: Diagnosis present

## 2021-03-12 DIAGNOSIS — I2510 Atherosclerotic heart disease of native coronary artery without angina pectoris: Secondary | ICD-10-CM | POA: Diagnosis present

## 2021-03-12 DIAGNOSIS — Z7982 Long term (current) use of aspirin: Secondary | ICD-10-CM

## 2021-03-12 DIAGNOSIS — J9601 Acute respiratory failure with hypoxia: Secondary | ICD-10-CM | POA: Diagnosis present

## 2021-03-12 DIAGNOSIS — E440 Moderate protein-calorie malnutrition: Secondary | ICD-10-CM | POA: Diagnosis present

## 2021-03-12 DIAGNOSIS — B0229 Other postherpetic nervous system involvement: Secondary | ICD-10-CM | POA: Diagnosis present

## 2021-03-12 DIAGNOSIS — Z79899 Other long term (current) drug therapy: Secondary | ICD-10-CM

## 2021-03-12 DIAGNOSIS — Z955 Presence of coronary angioplasty implant and graft: Secondary | ICD-10-CM

## 2021-03-12 DIAGNOSIS — Z833 Family history of diabetes mellitus: Secondary | ICD-10-CM

## 2021-03-12 DIAGNOSIS — F32A Depression, unspecified: Secondary | ICD-10-CM | POA: Diagnosis present

## 2021-03-12 DIAGNOSIS — Z66 Do not resuscitate: Secondary | ICD-10-CM | POA: Diagnosis present

## 2021-03-12 DIAGNOSIS — Z7984 Long term (current) use of oral hypoglycemic drugs: Secondary | ICD-10-CM

## 2021-03-12 DIAGNOSIS — H548 Legal blindness, as defined in USA: Secondary | ICD-10-CM | POA: Diagnosis present

## 2021-03-12 DIAGNOSIS — Z7902 Long term (current) use of antithrombotics/antiplatelets: Secondary | ICD-10-CM

## 2021-03-12 DIAGNOSIS — Z9109 Other allergy status, other than to drugs and biological substances: Secondary | ICD-10-CM

## 2021-03-12 DIAGNOSIS — Z888 Allergy status to other drugs, medicaments and biological substances status: Secondary | ICD-10-CM

## 2021-03-12 DIAGNOSIS — E1142 Type 2 diabetes mellitus with diabetic polyneuropathy: Secondary | ICD-10-CM | POA: Diagnosis present

## 2021-03-12 DIAGNOSIS — Z87891 Personal history of nicotine dependence: Secondary | ICD-10-CM

## 2021-03-12 LAB — COMPREHENSIVE METABOLIC PANEL
ALT: 13 U/L (ref 0–44)
AST: 22 U/L (ref 15–41)
Albumin: 3.2 g/dL — ABNORMAL LOW (ref 3.5–5.0)
Alkaline Phosphatase: 83 U/L (ref 38–126)
Anion gap: 12 (ref 5–15)
BUN: 39 mg/dL — ABNORMAL HIGH (ref 8–23)
CO2: 24 mmol/L (ref 22–32)
Calcium: 8.7 mg/dL — ABNORMAL LOW (ref 8.9–10.3)
Chloride: 99 mmol/L (ref 98–111)
Creatinine, Ser: 3.7 mg/dL — ABNORMAL HIGH (ref 0.44–1.00)
GFR, Estimated: 11 mL/min — ABNORMAL LOW (ref >60)
Glucose, Bld: 190 mg/dL — ABNORMAL HIGH (ref 70–99)
Potassium: 4.8 mmol/L (ref 3.5–5.1)
Sodium: 135 mmol/L (ref 135–145)
Total Bilirubin: 0.7 mg/dL (ref 0.3–1.2)
Total Protein: 7 g/dL (ref 6.5–8.1)

## 2021-03-12 LAB — CBC WITH DIFFERENTIAL/PLATELET
Abs Immature Granulocytes: 0.05 10*3/uL (ref 0.00–0.07)
Basophils Absolute: 0.1 10*3/uL (ref 0.0–0.1)
Basophils Relative: 1 %
Eosinophils Absolute: 0.5 10*3/uL (ref 0.0–0.5)
Eosinophils Relative: 4 %
HCT: 26.8 % — ABNORMAL LOW (ref 36.0–46.0)
Hemoglobin: 7.9 g/dL — ABNORMAL LOW (ref 12.0–15.0)
Immature Granulocytes: 0 %
Lymphocytes Relative: 11 %
Lymphs Abs: 1.3 10*3/uL (ref 0.7–4.0)
MCH: 25.6 pg — ABNORMAL LOW (ref 26.0–34.0)
MCHC: 29.5 g/dL — ABNORMAL LOW (ref 30.0–36.0)
MCV: 87 fL (ref 80.0–100.0)
Monocytes Absolute: 0.9 10*3/uL (ref 0.1–1.0)
Monocytes Relative: 7 %
Neutro Abs: 9.1 10*3/uL — ABNORMAL HIGH (ref 1.7–7.7)
Neutrophils Relative %: 77 %
Platelets: 446 10*3/uL — ABNORMAL HIGH (ref 150–400)
RBC: 3.08 MIL/uL — ABNORMAL LOW (ref 3.87–5.11)
RDW: 15.5 % (ref 11.5–15.5)
WBC: 11.8 10*3/uL — ABNORMAL HIGH (ref 4.0–10.5)
nRBC: 0 % (ref 0.0–0.2)

## 2021-03-12 LAB — TROPONIN I (HIGH SENSITIVITY): Troponin I (High Sensitivity): 15 ng/L (ref <18)

## 2021-03-12 LAB — CBC
HCT: 21.8 % — ABNORMAL LOW (ref 36.0–46.0)
Hemoglobin: 6.4 g/dL — ABNORMAL LOW (ref 12.0–15.0)
MCH: 25.6 pg — ABNORMAL LOW (ref 26.0–34.0)
MCHC: 29.4 g/dL — ABNORMAL LOW (ref 30.0–36.0)
MCV: 87.2 fL (ref 80.0–100.0)
Platelets: 334 10*3/uL (ref 150–400)
RBC: 2.5 MIL/uL — ABNORMAL LOW (ref 3.87–5.11)
RDW: 15.4 % (ref 11.5–15.5)
WBC: 9.8 10*3/uL (ref 4.0–10.5)
nRBC: 0 % (ref 0.0–0.2)

## 2021-03-12 LAB — RETICULOCYTES
Immature Retic Fract: 17.6 % — ABNORMAL HIGH (ref 2.3–15.9)
RBC.: 2.54 MIL/uL — ABNORMAL LOW (ref 3.87–5.11)
Retic Count, Absolute: 35.6 10*3/uL (ref 19.0–186.0)
Retic Ct Pct: 1.4 % (ref 0.4–3.1)

## 2021-03-12 LAB — CBG MONITORING, ED
Glucose-Capillary: 184 mg/dL — ABNORMAL HIGH (ref 70–99)
Glucose-Capillary: 192 mg/dL — ABNORMAL HIGH (ref 70–99)

## 2021-03-12 MED ORDER — LACTATED RINGERS IV BOLUS
1000.0000 mL | Freq: Once | INTRAVENOUS | Status: AC
  Administered 2021-03-12: 20:00:00 1000 mL via INTRAVENOUS

## 2021-03-12 MED ORDER — CLOPIDOGREL BISULFATE 75 MG PO TABS
75.0000 mg | ORAL_TABLET | Freq: Every day | ORAL | Status: DC
  Administered 2021-03-13 – 2021-03-19 (×7): 75 mg via ORAL
  Filled 2021-03-12 (×7): qty 1

## 2021-03-12 MED ORDER — DULOXETINE HCL 30 MG PO CPEP
30.0000 mg | ORAL_CAPSULE | Freq: Every day | ORAL | Status: DC
  Administered 2021-03-14 – 2021-03-19 (×6): 30 mg via ORAL
  Filled 2021-03-12 (×8): qty 1

## 2021-03-12 MED ORDER — ACETAMINOPHEN 325 MG PO TABS
650.0000 mg | ORAL_TABLET | Freq: Four times a day (QID) | ORAL | Status: DC | PRN
  Administered 2021-03-17: 650 mg via ORAL
  Filled 2021-03-12: qty 2

## 2021-03-12 MED ORDER — ALBUTEROL SULFATE (2.5 MG/3ML) 0.083% IN NEBU: 3.0000 mL | INHALATION_SOLUTION | Freq: Four times a day (QID) | RESPIRATORY_TRACT | Status: DC | PRN

## 2021-03-12 MED ORDER — MIRTAZAPINE 15 MG PO TABS
7.5000 mg | ORAL_TABLET | Freq: Every day | ORAL | Status: DC
  Administered 2021-03-13 – 2021-03-18 (×6): 7.5 mg via ORAL
  Filled 2021-03-12 (×6): qty 1

## 2021-03-12 MED ORDER — INSULIN ASPART 100 UNIT/ML IJ SOLN
0.0000 [IU] | Freq: Three times a day (TID) | INTRAMUSCULAR | Status: DC
  Administered 2021-03-13: 2 [IU] via SUBCUTANEOUS
  Administered 2021-03-14: 1 [IU] via SUBCUTANEOUS
  Administered 2021-03-14: 5 [IU] via SUBCUTANEOUS
  Administered 2021-03-15: 1 [IU] via SUBCUTANEOUS
  Administered 2021-03-15: 3 [IU] via SUBCUTANEOUS
  Administered 2021-03-16: 1 [IU] via SUBCUTANEOUS
  Administered 2021-03-17: 3 [IU] via SUBCUTANEOUS
  Administered 2021-03-17 – 2021-03-18 (×2): 2 [IU] via SUBCUTANEOUS
  Administered 2021-03-18 – 2021-03-19 (×4): 1 [IU] via SUBCUTANEOUS
  Administered 2021-03-19: 2 [IU] via SUBCUTANEOUS
  Filled 2021-03-12 (×11): qty 1

## 2021-03-12 MED ORDER — ACETAMINOPHEN 650 MG RE SUPP: 650.0000 mg | Freq: Four times a day (QID) | RECTAL | Status: DC | PRN

## 2021-03-12 MED ORDER — FERROUS SULFATE 325 (65 FE) MG PO TABS
325.0000 mg | ORAL_TABLET | Freq: Every day | ORAL | Status: DC
  Administered 2021-03-13 – 2021-03-19 (×7): 325 mg via ORAL
  Filled 2021-03-12 (×7): qty 1

## 2021-03-12 MED ORDER — PANTOPRAZOLE SODIUM 40 MG PO TBEC
40.0000 mg | DELAYED_RELEASE_TABLET | Freq: Two times a day (BID) | ORAL | Status: DC
  Administered 2021-03-12 – 2021-03-19 (×14): 40 mg via ORAL
  Filled 2021-03-12 (×14): qty 1

## 2021-03-12 MED ORDER — GABAPENTIN 300 MG PO CAPS
600.0000 mg | ORAL_CAPSULE | Freq: Three times a day (TID) | ORAL | Status: DC
  Administered 2021-03-13 – 2021-03-15 (×9): 600 mg via ORAL
  Filled 2021-03-12 (×9): qty 2

## 2021-03-12 MED ORDER — ASPIRIN EC 81 MG PO TBEC
81.0000 mg | DELAYED_RELEASE_TABLET | Freq: Every day | ORAL | Status: DC
  Administered 2021-03-13 – 2021-03-19 (×7): 81 mg via ORAL
  Filled 2021-03-12 (×7): qty 1

## 2021-03-12 MED ORDER — LACTATED RINGERS IV SOLN: INTRAVENOUS | Status: AC

## 2021-03-12 MED ORDER — ATORVASTATIN CALCIUM 20 MG PO TABS
80.0000 mg | ORAL_TABLET | Freq: Every day | ORAL | Status: DC
  Administered 2021-03-12 – 2021-03-18 (×7): 80 mg via ORAL
  Filled 2021-03-12: qty 4
  Filled 2021-03-12 (×3): qty 1
  Filled 2021-03-12: qty 4
  Filled 2021-03-12 (×2): qty 1

## 2021-03-12 MED ORDER — LACTATED RINGERS IV BOLUS
1000.0000 mL | Freq: Once | INTRAVENOUS | Status: AC
  Administered 2021-03-12: 19:00:00 1000 mL via INTRAVENOUS

## 2021-03-12 MED ORDER — HYDRALAZINE HCL 20 MG/ML IJ SOLN: 10.0000 mg | INTRAMUSCULAR | Status: DC | PRN

## 2021-03-12 MED ORDER — INSULIN GLARGINE-YFGN 100 UNIT/ML ~~LOC~~ SOLN
40.0000 [IU] | Freq: Every day | SUBCUTANEOUS | Status: DC
  Administered 2021-03-12 – 2021-03-18 (×5): 40 [IU] via SUBCUTANEOUS
  Filled 2021-03-12 (×8): qty 0.4

## 2021-03-12 NOTE — H&P (Signed)
History and Physical    Judy Harvey VPX:[id number] DOB: [birth year] DOA: 03/12/2021  PCP: Mick Sell, MD  Patient coming from: Home.  Chief Complaint: Weakness and falls.  HPI: Judy Harvey is a 87 y.o. female with history of CAD status post stenting, hypertension, diabetes mellitus type 2, legally blind, chronic anemia was brought to the ER after patient was finding increasingly weak and has had at least 3 falls in the last 1 week.  Denies losing consciousness.  Denies any chest pain but has been some nausea and poor appetite.  ED Course: In the ER patient was hypotensive responding to IV fluids.  Hemoglobin is 7.9 which is dropped from 10.4 about few months ago.  Creatinine is markedly increased from 0.9 in August 2022 it is around 3.7.  COVID test are negative.  Patient admitted for acute renal failure with worsening anemia and generalized weakness.  Review of Systems: As per HPI, rest all negative.   Past Medical History:  Diagnosis Date   Anemia    Depression    Diabetes mellitus without complication (HCC)    GERD (gastroesophageal reflux disease)    Hyperlipidemia    Hypertension    Legally blind    Macular degeneration, bilateral    Peripheral neuropathy    Post herpetic neuralgia    located in her back   Stroke [facility])     Past Surgical History:  Procedure Laterality Date   ABDOMINAL HYSTERECTOMY     partial   APPENDECTOMY     BREAST SURGERY     reduction   CAROTID PTA/STENT INTERVENTION Left 10/18/2017   Procedure: CAROTID PTA/STENT INTERVENTION;  Surgeon: Annice Needy, MD;  Location: [facility] INVASIVE CV LAB;  Service: Cardiovascular;  Laterality: Left;   CATARACT EXTRACTION, BILATERAL     CESAREAN SECTION     x3   COLONOSCOPY     ESOPHAGEAL DILATION       reports that she quit smoking about 15 years ago. Her smoking use included cigarettes. She has a 150.00 pack-year smoking history. She has never used smokeless tobacco. She reports that she  does not drink alcohol and does not use drugs.  Allergies  Allergen Reactions   Adhesive [Tape] Other (See Comments)    "pulls my skin off" paper tape ok   Lyrica [Pregabalin]     Body spasms    Family History  Problem Relation Age of Onset   Healthy Mother    Diabetes Father    Alzheimer's disease Father    Multiple sclerosis Sister    Multiple sclerosis Brother     Prior to Admission medications   Medication Sig Start Date End Date Taking? Authorizing Provider  amLODipine (NORVASC) 5 MG tablet Take 5 mg by mouth daily.   Yes [provider]  aspirin EC 81 MG tablet Take 81 mg by mouth daily.   Yes [provider]  atorvastatin (LIPITOR) 80 MG tablet TAKE 1 TABLET BY MOUTH EVERYDAY AT BEDTIME Patient taking differently: Take 80 mg by mouth at bedtime. 08/21/18  Yes Judy Limerick, MD  Cholecalciferol (VITAMIN D3) 5000 units CAPS Take 1 capsule (5,000 Units total) by mouth daily. 06/28/16  Yes Plonk, Chrissie Noa, MD  clobetasol ointment (TEMOVATE) 0.05 % Apply 1 application topically 2 (two) times daily.   Yes [provider]  clopidogrel (PLAVIX) 75 MG tablet Take 1 tablet (75 mg total) by mouth daily. 08/21/18  Yes Judy Limerick, MD  DULoxetine (CYMBALTA) 30 MG capsule  TAKE 1 CAPSULE BY MOUTH EVERY DAY Patient taking differently: Take 30 mg by mouth daily. 08/21/18  Yes Judy Limerick, MD  ferrous sulfate 325 (65 FE) MG tablet Take 325 mg by mouth daily.   Yes [provider]  gabapentin (NEURONTIN) 300 MG capsule Take 600 mg by mouth 3 (three) times daily.   Yes [provider]  insulin glargine (LANTUS) 100 UNIT/ML injection Inject 0.42 mLs (42 Units total) into the skin daily. INJECT 50 UNITS INTO THE SKIN DAILY AT 10 PM. 10/28/20  Yes Enedina Finner, MD  Insulin Syringe-Needle U-100 (B-D INS SYR ULTRAFINE .3CC/31G) 31G X 5/16" 0.3 ML MISC Use to administer insulin daily to control diabetes. Diagnosis: E11.40 Controlled Type 2 Diabetes with  diabetic neuropathy. 08/21/18  Yes Judy Limerick, MD  lisinopril (ZESTRIL) 40 MG tablet Take 40 mg by mouth daily.   Yes [provider]  metFORMIN (GLUCOPHAGE-XR) 500 MG 24 hr tablet Take 1,000 mg by mouth daily with supper.   Yes [provider]  mirtazapine (REMERON) 7.5 MG tablet Take 7.5 mg by mouth at bedtime. 02/25/21  Yes [provider]  nystatin (MYCOSTATIN/NYSTOP) powder Apply topically 2 (two) times daily. 01/29/21  Yes [provider]  pantoprazole (PROTONIX) 40 MG tablet Take 40 mg by mouth 2 (two) times daily. 02/23/21  Yes [provider]  acetaminophen (TYLENOL) 325 MG tablet Take 650 mg by mouth every 6 (six) hours as needed for mild pain or fever.    [provider]  albuterol (ACCUNEB) 1.25 MG/3ML nebulizer solution Take 3 mLs by nebulization every 6 (six) hours as needed for wheezing or shortness of breath. 11/12/19   [provider]  albuterol (VENTOLIN HFA) 108 (90 Base) MCG/ACT inhaler Inhale 2 puffs into the lungs every 6 (six) hours as needed for shortness of breath or wheezing. 11/06/19   [provider]  ondansetron (ZOFRAN) 8 MG tablet Take 8 mg by mouth every 8 (eight) hours as needed for nausea or vomiting.     [provider]    Physical Exam: Constitutional: Moderately built and nourished. Vitals:   03/12/21 1913 03/12/21 1915 03/12/21 1945 03/12/21 2000  BP:  (!) 66/49 (!) 143/131 (!) 85/37  Pulse: 81 78 73 75  Resp: (!) 24 (!) 21 (!) 26 (!) 25  Temp:      TempSrc:      SpO2: 91% 92% (!) 88% 93%  Weight:      Height:       Eyes: Anicteric no pallor. ENMT: No discharge from the ears eyes nose and mouth. Neck: No mass felt.  No neck rigidity. Respiratory: No rhonchi or crepitations. Cardiovascular: S1-S2 heard. Abdomen: Soft nontender bowel sound present. Musculoskeletal: No edema. Skin: No rash. Neurologic: Alert awake oriented time place and person.  Moves all  extremities. Psychiatric: Appears normal, normal affect.   Labs on Admission: I have personally reviewed following labs and imaging studies  CBC: Recent Labs  Lab 03/12/21 1900  WBC 11.8*  NEUTROABS 9.1*  HGB 7.9*  HCT 26.8*  MCV 87.0  PLT 446*   Basic Metabolic Panel: Recent Labs  Lab 03/12/21 1900  NA 135  K 4.8  CL 99  CO2 24  GLUCOSE 190*  BUN 39*  CREATININE 3.70*  CALCIUM 8.7*   GFR: Estimated Creatinine Clearance: 9.8 mL/min (A) (by C-G formula based on SCr of 3.7 mg/dL (H)). Liver Function Tests: Recent Labs  Lab 03/12/21 1900  AST 22  ALT 13  ALKPHOS 83  BILITOT 0.7  PROT 7.0  ALBUMIN 3.2*   No results for input(s): LIPASE, AMYLASE in the last 168 hours. No results for input(s): AMMONIA in the last 168 hours. Coagulation Profile: No results for input(s): INR, PROTIME in the last 168 hours. Cardiac Enzymes: No results for input(s): CKTOTAL, CKMB, CKMBINDEX, TROPONINI in the last 168 hours. BNP (last 3 results) No results for input(s): PROBNP in the last 8760 hours. HbA1C: No results for input(s): HGBA1C in the last 72 hours. CBG: Recent Labs  Lab 03/12/21 1839  GLUCAP 184*   Lipid Profile: No results for input(s): CHOL, HDL, LDLCALC, TRIG, CHOLHDL, LDLDIRECT in the last 72 hours. Thyroid Function Tests: No results for input(s): TSH, T4TOTAL, FREET4, T3FREE, THYROIDAB in the last 72 hours. Anemia Panel: No results for input(s): VITAMINB12, FOLATE, FERRITIN, TIBC, IRON, RETICCTPCT in the last 72 hours. Urine analysis:    Component Value Date/Time   COLORURINE YELLOW (A) 10/25/2020 1803   APPEARANCEUR HAZY (A) 10/25/2020 1803   LABSPEC 1.033 (H) 10/25/2020 1803   PHURINE 5.0 10/25/2020 1803   GLUCOSEU >=500 (A) 10/25/2020 1803   HGBUR SMALL (A) 10/25/2020 1803   BILIRUBINUR NEGATIVE 10/25/2020 1803   BILIRUBINUR negative 02/19/2018 1557   KETONESUR 20 (A) 10/25/2020 1803   PROTEINUR 30 (A) 10/25/2020 1803   UROBILINOGEN 0.2 02/19/2018  1557   NITRITE NEGATIVE 10/25/2020 1803   LEUKOCYTESUR NEGATIVE 10/25/2020 1803   Sepsis Labs: @LABRCNTIP (procalcitonin:4,lacticidven:4) )No results found for this or any previous visit (from the past 240 hour(s)).   Radiological Exams on Admission: DG Chest 1 View  Result Date: 03/12/2021 CLINICAL DATA:  Weakness, lower back pain EXAM: CHEST  1 VIEW COMPARISON:  10/25/2020 FINDINGS: Single frontal view of the chest demonstrates an unremarkable cardiac silhouette. No acute airspace disease. Minimal blunting of the left costophrenic angle may reflect a small left pleural effusion. No pneumothorax. No acute bony abnormalities. Chronic right rib fractures. IMPRESSION: 1. Trace left pleural effusion versus pleural thickening. 2. Otherwise unremarkable exam. Electronically Signed   By: 10/27/2020 M.D.   On: 03/12/2021 19:06    EKG: Independently reviewed.  Normal sinus rhythm.  Assessment/Plan Principal Problem:   ARF (acute renal failure) (HCC) Active Problems:   Type 2 diabetes mellitus without complication, with long-term current use of insulin (HCC)   Legally blind   Normocytic anemia    Acute renal failure -patient states she has been having poor appetite and has not been eating well.  In addition patient is also on lisinopril and also hypotensive.  Urine studies are still pending.  We will also check a CT abdomen make sure there is no obstruction.  We will continue with hydration follow intake output metabolic panel. Anemia worsening from previous 10.4 is around 7.9.  Patient is nearly blind and does not know if she is passing any blood in the stools.  We will check stool for occult blood anemia panel follow CBC.  Transfuse if hemoglobin less than 7. Hypotension I suspect is likely from volume.  No definite signs of any infection.  Follow lactic acid levels. Nausea and poor appetite.  Abdomen appears benign.  Follow sonogram of right upper quadrant. History of CAD status post  tenting.  If stool for blood is positive would like to hold antiplatelet agents. Diabetes mellitus type 2 takes Lantus insulin I will decrease the dose since patient has poor appetite and also creatinine has increased. Hypertension holding antihypertensives due to hypotension.  Since patient has acute renal  failure with hypotension and worsening anemia will need close monitoring for any further worsening and inpatient status.   DVT prophylaxis: SCDs.  Avoiding anticoagulation until we rule out GI bleed. Code Status: No intubation. Family Communication: Discussed with patient. Disposition Plan: Home. Consults called: None. Admission status: Inpatient.   Eduard Clos MD [facility] Pager [phone].  If 7PM-7AM, please contact night-coverage [email or web address] Password [facility]  03/12/2021, 9:43 PM

## 2021-03-12 NOTE — ED Provider Notes (Signed)
[facility] Emergency Department Provider Note ____________________________________________   Event Date/Time   First MD Initiated Contact with Patient 03/12/21 1843     (approximate)  I have reviewed the triage vital signs and the nursing notes.  HISTORY  Chief Complaint Weakness   HPI Judy Harvey is a 87 y.o. femalewho presents to the ED for evaluation of weakness.   Chart review indicates history of bilateral blindness at baseline, HTN, HLD and DM.  History of stroke.  On DAPT with Plavix. Was seen yesterday as an outpatient by a local NP due to chronic nausea and was prescribed PPI.  Basic blood work was done that demonstrated an AKI with creatinine yesterday of 2.7 and GFR 17.  GFR typically around 42.  Patient presents to the ED, accompanied by her son, for evaluation of increasing subacute weakness.  They report that she has been much more weak, less active and not tolerating p.o. intake as well over the past 2 or 3 weeks.  They report no significant increased output.  She had 1 or 2 episodes of diarrhea yesterday, but nothing consistent.  No emesis.  Her appetite has been quite poor.  She has been eating potato chips only and not drinking much water.  Very little intake due to lack of interest.  No fevers or recent illnesses.  No recent antibiotics.  No dysuria, abdominal pain, cough, chest pain or syncope.  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)  08/21/2018   Low vitamin B12 level 04/26/2018   Iron deficiency anemia 03/28/2018   Frequent PVCs 03/21/2018   PSVT (paroxysmal supraventricular tachycardia) (HCC) 03/21/2018   Normocytic anemia 02/28/2018   Benign essential HTN 02/20/2018   SOBOE (shortness of breath on exertion) 02/20/2018   Carotid stenosis, symptomatic w/o infarct, left 10/18/2017   TIA (transient ischemic attack) 06/16/2017   Bilateral carotid artery stenosis 06/16/2017   Hyperlipidemia 06/16/2017   Obesity (BMI 30.0-34.9) 05/02/2016   Vitamin D deficiency 03/25/2016   Type 2 diabetes mellitus without complication, with long-term current use of insulin (HCC) 03/24/2016   Old cerebrovascular accident (CVA) without late effect 03/24/2016   Hypertension 03/24/2016   Legally blind 03/24/2016   Macular degeneration 03/24/2016   Constipation 03/24/2016   History of esophageal stricture 03/24/2016   Depression with anxiety 03/24/2016   Restless leg syndrome 03/24/2016   Postherpetic neuralgia 03/24/2016   Gait abnormality 03/24/2016    Past Surgical History:  Procedure Laterality Date   ABDOMINAL HYSTERECTOMY     partial   APPENDECTOMY     BREAST SURGERY     reduction   CAROTID PTA/STENT INTERVENTION Left 10/18/2017   Procedure: CAROTID PTA/STENT INTERVENTION;  Surgeon: Annice Needy, MD;  Location: [facility] INVASIVE CV LAB;  Service: Cardiovascular;  Laterality: Left;   CATARACT EXTRACTION, BILATERAL     CESAREAN SECTION     x3   COLONOSCOPY     ESOPHAGEAL  DILATION      Prior to Admission medications   Medication Sig Start Date End Date Taking? Authorizing Provider  acetaminophen (TYLENOL) 325 MG tablet Take 650 mg by mouth every 6 (six) hours as needed for mild pain or fever.    [provider]  albuterol (ACCUNEB) 1.25 MG/3ML nebulizer solution Take 3 mLs by nebulization every 6 (six) hours as needed for wheezing or shortness of breath. 11/12/19   [provider]  albuterol (VENTOLIN HFA) 108  (90 Base) MCG/ACT inhaler Inhale 2 puffs into the lungs every 6 (six) hours as needed for shortness of breath or wheezing. 11/06/19   [provider]  amLODipine (NORVASC) 5 MG tablet Take 5 mg by mouth daily.    [provider]  aspirin EC 81 MG tablet Take 81 mg by mouth daily.    [provider]  atorvastatin (LIPITOR) 80 MG tablet TAKE 1 TABLET BY MOUTH EVERYDAY AT BEDTIME Patient taking differently: Take 80 mg by mouth at bedtime. 08/21/18   Duanne Limerick, MD  Cholecalciferol (VITAMIN D3) 5000 units CAPS Take 1 capsule (5,000 Units total) by mouth daily. 06/28/16   Plonk, Chrissie Noa, MD  clobetasol ointment (TEMOVATE) 0.05 % Apply 1 application topically 2 (two) times daily.    [provider]  clopidogrel (PLAVIX) 75 MG tablet Take 1 tablet (75 mg total) by mouth daily. 08/21/18   Duanne Limerick, MD  DULoxetine (CYMBALTA) 30 MG capsule TAKE 1 CAPSULE BY MOUTH EVERY DAY Patient taking differently: Take 30 mg by mouth daily. 08/21/18   Duanne Limerick, MD  ferrous sulfate 325 (65 FE) MG tablet Take 325 mg by mouth daily.    [provider]  gabapentin (NEURONTIN) 300 MG capsule Take 600 mg by mouth 3 (three) times daily.    [provider]  insulin glargine (LANTUS) 100 UNIT/ML injection Inject 0.42 mLs (42 Units total) into the skin daily. INJECT 50 UNITS INTO THE SKIN DAILY AT 10 PM. 10/28/20   Enedina Finner, MD  Insulin Syringe-Needle U-100 (B-D INS SYR ULTRAFINE .3CC/31G) 31G X 5/16" 0.3 ML MISC Use to administer insulin daily to control diabetes. 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 08/21/18   Duanne Limerick, MD  lisinopril (ZESTRIL) 40 MG tablet Take 40 mg by mouth daily.    [provider]  metFORMIN (GLUCOPHAGE-XR) 500 MG 24 hr tablet Take 1,000 mg by mouth daily with supper.    [provider]  ondansetron (ZOFRAN) 8 MG tablet Take 8 mg by mouth every 8 (eight) hours as needed for nausea or  vomiting.     [provider]    Allergies Adhesive [tape] and Lyrica [pregabalin]  Family History  Problem Relation Age of Onset   Healthy Mother    Diabetes Father    Alzheimer's disease Father    Multiple sclerosis Sister    Multiple sclerosis Brother     Social History Social History   Tobacco Use   Smoking status: Former    Packs/day: 3.00    Years: 50.00    Pack years: 150.00    Types: Cigarettes    Quit date: 2008    Years since quitting: 15.0   Smokeless tobacco: Never   Tobacco comments:    smoking cessation materials not required  Vaping Use   Vaping Use: Never used  Substance Use Topics   Alcohol use: No   Drug use: No    Review of Systems  Constitutional: No  fever/chills.  Positive for generalized weakness Eyes: No visual changes. ENT: No sore throat. Cardiovascular: Denies chest pain. Respiratory: Denies shortness of breath. Gastrointestinal: No abdominal pain.  No nausea, no vomiting.    No constipation. Positive for 2 episodes of diarrhea yesterday. Genitourinary: Negative for dysuria. Musculoskeletal: Negative for back pain. Skin: Negative for rash. Neurological: Negative for headaches, focal weakness or numbness.  ____________________________________________   PHYSICAL EXAM:  VITAL SIGNS: Vitals:   03/12/21 1945 03/12/21 2000  BP: (!) 143/131 (!) 85/37  Pulse: 73 75  Resp: (!) 26 (!) 25  Temp:    SpO2: (!) 88% 93%     Constitutional: Alert and oriented. Well appearing and in no acute distress. Eyes: Conjunctivae are normal.  Blind. Head: Atraumatic. Nose: No congestion/rhinnorhea. Mouth/Throat: Mucous membranes are dry.  Oropharynx non-erythematous. Neck: No stridor. No cervical spine tenderness to palpation. Cardiovascular: Normal rate, regular rhythm. Good peripheral circulation. Respiratory: Minimal tachypnea with clear lungs. Gastrointestinal: Soft , nondistended, nontender to palpation.  Musculoskeletal: No joint  effusions. No signs of acute trauma. Neurologic:  Normal speech and language. No gross focal neurologic deficits are appreciated.  Cranial nerves II through XII intact, aside from chronic blindness 5/5 strength and sensation in all 4 extremities Skin:  Skin is warm, dry and intact. No rash noted. Psychiatric: Mood and affect are normal. Speech and behavior are normal. ____________________________________________   LABS (all labs ordered are listed, but only abnormal results are displayed)  Labs Reviewed  COMPREHENSIVE METABOLIC PANEL - Abnormal; Notable for the following components:      Result Value   Glucose, Bld 190 (*)    BUN 39 (*)    Creatinine, Ser 3.70 (*)    Calcium 8.7 (*)    Albumin 3.2 (*)    GFR, Estimated 11 (*)    All other components within normal limits  CBC WITH DIFFERENTIAL/PLATELET - Abnormal; Notable for the following components:   WBC 11.8 (*)    RBC 3.08 (*)    Hemoglobin 7.9 (*)    HCT 26.8 (*)    MCH 25.6 (*)    MCHC 29.5 (*)    Platelets 446 (*)    Neutro Abs 9.1 (*)    All other components within normal limits  CBG MONITORING, ED - Abnormal; Notable for the following components:   Glucose-Capillary 184 (*)    All other components within normal limits  RESP PANEL BY RT-PCR (FLU A&B, COVID) [id number]  URINALYSIS, ROUTINE W REFLEX MICROSCOPIC  TYPE AND SCREEN  TROPONIN I (HIGH SENSITIVITY)  TROPONIN I (HIGH SENSITIVITY)   ____________________________________________  12 Lead EKG  Sinus rhythm at a rate of 87 bpm.  Normal axis.  Right bundle.  No evidence of acute ischemia. ____________________________________________  RADIOLOGY  ED MD interpretation:  CXR reviewed by me without evidence of acute cardiopulmonary pathology.  Official radiology report(s): DG Chest 1 View  Result Date: 03/12/2021 CLINICAL DATA:  Weakness, lower back pain EXAM: CHEST  1 VIEW COMPARISON:  10/25/2020 FINDINGS: Single frontal view of the chest demonstrates an  unremarkable cardiac silhouette. No acute airspace disease. Minimal blunting of the left costophrenic angle may reflect a small left pleural effusion. No pneumothorax. No acute bony abnormalities. Chronic right rib fractures. IMPRESSION: 1. Trace left pleural effusion versus pleural thickening. 2. Otherwise unremarkable exam. Electronically Signed   By: Sharlet Salina M.D.   On: 03/12/2021 19:06    ____________________________________________   PROCEDURES and INTERVENTIONS  Procedure(s) performed (including Critical Care):  .1-3 Lead EKG  Interpretation Performed by: Delton Prairie, MD Authorized by: Delton Prairie, MD     Interpretation: normal     ECG rate:  74   ECG rate assessment: normal     Rhythm: sinus rhythm     Ectopy: none     Conduction: normal   .Critical Care Performed by: Delton Prairie, MD Authorized by: Delton Prairie, MD   Critical care provider statement:    Critical care time (minutes):  30   Critical care time was exclusive of:  Separately billable procedures and treating other patients   Critical care was necessary to treat or prevent imminent or life-threatening deterioration of the following conditions:  Circulatory failure   Critical care was time spent personally by me on the following activities:  Development of treatment plan with patient or surrogate, discussions with consultants, evaluation of patient's response to treatment, examination of patient, ordering and review of laboratory studies, ordering and review of radiographic studies, ordering and performing treatments and interventions, pulse oximetry, re-evaluation of patient's condition and review of old charts Ultrasound ED Peripheral IV (Provider)  Date/Time: 03/12/2021 7:37 PM Performed by: Delton Prairie, MD Authorized by: Delton Prairie, MD   Procedure details:    Indications: hydration, hypotension and multiple failed IV attempts     Skin Prep: chlorhexidine gluconate     Location:  Right AC    Angiocath:  20 G   Bedside Ultrasound Guided: Yes     Images: not archived     Patient tolerated procedure without complications: Yes     Dressing applied: Yes    Medications  lactated ringers bolus 1,000 mL (0 mLs Intravenous Stopped 03/12/21 2009)  lactated ringers bolus 1,000 mL (1,000 mLs Intravenous New Bag/Given 03/12/21 2009)    ____________________________________________   MDM / ED COURSE   87 year old female presents to the ED with generalized weakness with evidence of an AKI requiring medical observation admission.  Soft pressures on arrival improved with IV fluids peripherally.  Medications for pressors.  Mild leukocytosis is noted but she has no infiltrates on CXR and no further symptoms suggest acute infectious pathology.  Urine is pending at time of admission, but she has no symptoms to suggest acute cystitis to represent urinary source of infection or sepsis.  She has no symptoms of blood loss such as hematochezia, melena or hematemesis, but small hemoglobin drop is noted.  She was typed and screened.  No current indications for emergent transfusion as her BP is improving well with fluids.  Clinical Course as of 03/12/21 2057  Fri Mar 12, 2021  1935 USIV placed by me .  Mental status remains the same and her blood pressures are soft. [DS]  2055 Reassessed.  Sleeping and awakens easily.  Repeat BP improved at 95/72 [DS]    Clinical Course User Index [DS] Delton Prairie, MD    ____________________________________________   FINAL CLINICAL IMPRESSION(S) / ED DIAGNOSES  Final diagnoses:  Generalized weakness  AKI (acute kidney injury) (HCC)  Hypotension due to hypovolemia     ED Discharge Orders     None        Ransom Nickson   Note:  This document was prepared using Dragon voice recognition software and may include unintentional dictation errors.    Delton Prairie, MD 03/12/21 2119

## 2021-03-12 NOTE — ED Triage Notes (Signed)
Patient presents with weakness and lower back pain. Baseline ambulatory with no help. Patient is legally blind. Son reports she has fallen X3 in the past week from weakness. Patient is A&O x4

## 2021-03-12 NOTE — ED Provider Notes (Signed)
Emergency Medicine Provider Triage Evaluation Note  Judy Harvey , a 87 y.o. female  was evaluated in triage.  Per son, complains of weakness and decreased appetite. Labs drawn yesterday at [facility]. Her "blood is low and her kidney numbers are off."  Review of Systems  Positive: Nausea Negative: Fever  Physical Exam  There were no vitals taken for this visit. Gen:   Awake, no distress   Resp:  Normal effort  MSK:   Moves extremities without difficulty  Other:    Medical Decision Making  Medically screening exam initiated at 6:27 PM.  Appropriate orders placed.  Judy Harvey was informed that the remainder of the evaluation will be completed by another provider, this initial triage assessment does not replace that evaluation, and the importance of remaining in the ED until their evaluation is complete.    Chinita Pester, FNP 03/12/21 1837    Minna Antis, MD 03/13/21 [phone]

## 2021-03-13 ENCOUNTER — Other Ambulatory Visit: Payer: Medicare Other

## 2021-03-13 ENCOUNTER — Inpatient Hospital Stay: Payer: Medicare Other

## 2021-03-13 DIAGNOSIS — E785 Hyperlipidemia, unspecified: Secondary | ICD-10-CM | POA: Diagnosis present

## 2021-03-13 DIAGNOSIS — Z6828 Body mass index (BMI) 28.0-28.9, adult: Secondary | ICD-10-CM | POA: Diagnosis not present

## 2021-03-13 DIAGNOSIS — D638 Anemia in other chronic diseases classified elsewhere: Secondary | ICD-10-CM | POA: Diagnosis present

## 2021-03-13 DIAGNOSIS — Z20822 Contact with and (suspected) exposure to covid-19: Secondary | ICD-10-CM | POA: Diagnosis present

## 2021-03-13 DIAGNOSIS — D509 Iron deficiency anemia, unspecified: Secondary | ICD-10-CM | POA: Diagnosis present

## 2021-03-13 DIAGNOSIS — E861 Hypovolemia: Secondary | ICD-10-CM | POA: Diagnosis present

## 2021-03-13 DIAGNOSIS — I2510 Atherosclerotic heart disease of native coronary artery without angina pectoris: Secondary | ICD-10-CM | POA: Diagnosis present

## 2021-03-13 DIAGNOSIS — Z833 Family history of diabetes mellitus: Secondary | ICD-10-CM | POA: Diagnosis not present

## 2021-03-13 DIAGNOSIS — J9601 Acute respiratory failure with hypoxia: Secondary | ICD-10-CM | POA: Diagnosis present

## 2021-03-13 DIAGNOSIS — E440 Moderate protein-calorie malnutrition: Secondary | ICD-10-CM | POA: Diagnosis present

## 2021-03-13 DIAGNOSIS — H3530 Unspecified macular degeneration: Secondary | ICD-10-CM | POA: Diagnosis present

## 2021-03-13 DIAGNOSIS — B0229 Other postherpetic nervous system involvement: Secondary | ICD-10-CM | POA: Diagnosis present

## 2021-03-13 DIAGNOSIS — H548 Legal blindness, as defined in USA: Secondary | ICD-10-CM | POA: Diagnosis present

## 2021-03-13 DIAGNOSIS — D649 Anemia, unspecified: Secondary | ICD-10-CM | POA: Diagnosis not present

## 2021-03-13 DIAGNOSIS — Z90710 Acquired absence of both cervix and uterus: Secondary | ICD-10-CM | POA: Diagnosis not present

## 2021-03-13 DIAGNOSIS — I9589 Other hypotension: Secondary | ICD-10-CM | POA: Diagnosis present

## 2021-03-13 DIAGNOSIS — Z794 Long term (current) use of insulin: Secondary | ICD-10-CM

## 2021-03-13 DIAGNOSIS — E1142 Type 2 diabetes mellitus with diabetic polyneuropathy: Secondary | ICD-10-CM | POA: Diagnosis present

## 2021-03-13 DIAGNOSIS — E119 Type 2 diabetes mellitus without complications: Secondary | ICD-10-CM | POA: Diagnosis not present

## 2021-03-13 DIAGNOSIS — I10 Essential (primary) hypertension: Secondary | ICD-10-CM | POA: Diagnosis present

## 2021-03-13 DIAGNOSIS — Z8673 Personal history of transient ischemic attack (TIA), and cerebral infarction without residual deficits: Secondary | ICD-10-CM | POA: Diagnosis not present

## 2021-03-13 DIAGNOSIS — N179 Acute kidney failure, unspecified: Secondary | ICD-10-CM | POA: Diagnosis present

## 2021-03-13 DIAGNOSIS — F32A Depression, unspecified: Secondary | ICD-10-CM | POA: Diagnosis present

## 2021-03-13 DIAGNOSIS — E875 Hyperkalemia: Secondary | ICD-10-CM | POA: Diagnosis present

## 2021-03-13 DIAGNOSIS — R531 Weakness: Secondary | ICD-10-CM | POA: Diagnosis present

## 2021-03-13 DIAGNOSIS — E860 Dehydration: Secondary | ICD-10-CM | POA: Diagnosis present

## 2021-03-13 DIAGNOSIS — Z955 Presence of coronary angioplasty implant and graft: Secondary | ICD-10-CM | POA: Diagnosis not present

## 2021-03-13 DIAGNOSIS — Z66 Do not resuscitate: Secondary | ICD-10-CM | POA: Diagnosis present

## 2021-03-13 LAB — URINALYSIS, COMPLETE (UACMP) WITH MICROSCOPIC
Bilirubin Urine: NEGATIVE
Glucose, UA: NEGATIVE mg/dL
Hgb urine dipstick: NEGATIVE
Ketones, ur: NEGATIVE mg/dL
Nitrite: NEGATIVE
Protein, ur: NEGATIVE mg/dL
Specific Gravity, Urine: 1.015 (ref 1.005–1.030)
pH: 5 (ref 5.0–8.0)

## 2021-03-13 LAB — BASIC METABOLIC PANEL
Anion gap: 10 (ref 5–15)
BUN: 41 mg/dL — ABNORMAL HIGH (ref 8–23)
CO2: 25 mmol/L (ref 22–32)
Calcium: 8.2 mg/dL — ABNORMAL LOW (ref 8.9–10.3)
Chloride: 101 mmol/L (ref 98–111)
Creatinine, Ser: 3.15 mg/dL — ABNORMAL HIGH (ref 0.44–1.00)
GFR, Estimated: 13 mL/min — ABNORMAL LOW (ref >60)
Glucose, Bld: 164 mg/dL — ABNORMAL HIGH (ref 70–99)
Potassium: 4.8 mmol/L (ref 3.5–5.1)
Sodium: 136 mmol/L (ref 135–145)

## 2021-03-13 LAB — CBG MONITORING, ED
Glucose-Capillary: 154 mg/dL — ABNORMAL HIGH (ref 70–99)
Glucose-Capillary: 112 mg/dL — ABNORMAL HIGH (ref 70–99)

## 2021-03-13 LAB — FOLATE: Folate: 9.7 ng/mL (ref >5.9)

## 2021-03-13 LAB — SODIUM, URINE, RANDOM: Sodium, Ur: 59 mmol/L

## 2021-03-13 LAB — CBC
HCT: 27 % — ABNORMAL LOW (ref 36.0–46.0)
Hemoglobin: 8.2 g/dL — ABNORMAL LOW (ref 12.0–15.0)
MCH: 25.7 pg — ABNORMAL LOW (ref 26.0–34.0)
MCHC: 30.4 g/dL (ref 30.0–36.0)
MCV: 84.6 fL (ref 80.0–100.0)
Platelets: 315 10*3/uL (ref 150–400)
RBC: 3.19 MIL/uL — ABNORMAL LOW (ref 3.87–5.11)
RDW: 15.3 % (ref 11.5–15.5)
WBC: 11.5 10*3/uL — ABNORMAL HIGH (ref 4.0–10.5)
nRBC: 0 % (ref 0.0–0.2)

## 2021-03-13 LAB — IRON AND TIBC
Iron: 14 ug/dL — ABNORMAL LOW (ref 28–170)
Saturation Ratios: 4 % — ABNORMAL LOW (ref 10.4–31.8)
TIBC: 361 ug/dL (ref 250–450)
UIBC: 347 ug/dL

## 2021-03-13 LAB — RESP PANEL BY RT-PCR (FLU A&B, COVID) ARPGX2
Influenza A by PCR: NEGATIVE
Influenza B by PCR: NEGATIVE
SARS Coronavirus 2 by RT PCR: NEGATIVE

## 2021-03-13 LAB — GLUCOSE, CAPILLARY: Glucose-Capillary: 240 mg/dL — ABNORMAL HIGH (ref 70–99)

## 2021-03-13 LAB — CK: Total CK: 166 U/L (ref 38–234)

## 2021-03-13 LAB — PREPARE RBC (CROSSMATCH)

## 2021-03-13 LAB — OCCULT BLOOD X 1 CARD TO LAB, STOOL: Fecal Occult Bld: NEGATIVE

## 2021-03-13 LAB — VITAMIN B12: Vitamin B-12: 3652 pg/mL — ABNORMAL HIGH (ref 180–914)

## 2021-03-13 LAB — TROPONIN I (HIGH SENSITIVITY)
Troponin I (High Sensitivity): 13 ng/L (ref <18)
Troponin I (High Sensitivity): 14 ng/L (ref <18)

## 2021-03-13 LAB — FERRITIN: Ferritin: 6 ng/mL — ABNORMAL LOW (ref 11–307)

## 2021-03-13 LAB — LACTIC ACID, PLASMA: Lactic Acid, Venous: 1.6 mmol/L (ref 0.5–1.9)

## 2021-03-13 MED ORDER — SODIUM CHLORIDE 0.9% IV SOLUTION
Freq: Once | INTRAVENOUS | Status: AC
  Filled 2021-03-13: qty 250

## 2021-03-13 NOTE — Plan of Care (Signed)

## 2021-03-13 NOTE — ED Notes (Signed)
ED TO INPATIENT HANDOFF REPORT  ED Nurse Name and Phone #: Delice Bison, RN  S Name/Age/Gender Judy Harvey 87 y.o. female Room/Bed: ED25A/ED25A  Code Status   Code Status: Partial Code  Home/SNF/Other Home Patient oriented to: self, place, time, and situation Is this baseline? Yes   Triage Complete: Triage complete  Chief Complaint ARF (acute renal failure) (HCC) [N17.9]  Triage Note Patient presents with weakness and lower back pain. Baseline ambulatory with no help. Patient is legally blind. Son reports she has fallen X3 in the past week from weakness. Patient is A&O x4    Allergies Allergies  Allergen Reactions   Adhesive [Tape] Other (See Comments)    "pulls my skin off" paper tape ok   Lyrica [Pregabalin]     Body spasms    Level of Care/Admitting Diagnosis ED Disposition     ED Disposition  Admit   Condition  --   Comment  Hospital Area: [facility] [[id number]]  Level of Care: Progressive [102]  Admit to Progressive based on following criteria: MULTISYSTEM THREATS such as stable sepsis, metabolic/electrolyte imbalance with or without encephalopathy that is responding to early treatment.  Covid Evaluation: Confirmed COVID Negative  Diagnosis: ARF (acute renal failure) [facility]) [[id number]]  Admitting Physician: Eduard Clos [id number]  Attending Physician: Eduard Clos [phone]  Estimated length of stay: past midnight tomorrow  Certification:: I certify this patient will need inpatient services for at least 2 midnights          B Medical/Surgery History Past Medical History:  Diagnosis Date   Anemia    Depression    Diabetes mellitus without complication (HCC)    GERD (gastroesophageal reflux disease)    Hyperlipidemia    Hypertension    Legally blind    Macular degeneration, bilateral    Peripheral neuropathy    Post herpetic neuralgia    located in her back   Stroke [facility])    Past Surgical History:  Procedure  Laterality Date   ABDOMINAL HYSTERECTOMY     partial   APPENDECTOMY     BREAST SURGERY     reduction   CAROTID PTA/STENT INTERVENTION Left 10/18/2017   Procedure: CAROTID PTA/STENT INTERVENTION;  Surgeon: Annice Needy, MD;  Location: [facility] INVASIVE CV LAB;  Service: Cardiovascular;  Laterality: Left;   CATARACT EXTRACTION, BILATERAL     CESAREAN SECTION     x3   COLONOSCOPY     ESOPHAGEAL DILATION       A IV Location/Drains/Wounds Patient Lines/Drains/Airways Status     Active Line/Drains/Airways     Name Placement date Placement time Site Days   Peripheral IV 03/12/21 22 G Anterior;Distal;Right Forearm 03/12/21  1907  Forearm  1   Peripheral IV 03/12/21 20 G Anterior;Right;Upper Arm 03/12/21  1940  Arm  1            Intake/Output Last 24 hours  Intake/Output Summary (Last 24 hours) at 03/13/2021 1544 Last data filed at 03/13/2021 0720 Gross per 24 hour  Intake 2675.28 ml  Output --  Net 2675.28 ml    Labs/Imaging Results for orders placed or performed during the hospital encounter of 03/12/21 (from the past 48 hour(s))  CBG monitoring, ED     Status: Abnormal   Collection Time: 03/12/21  6:39 PM  Result Value Ref Range   Glucose-Capillary 184 (H) 70 - 99 mg/dL    Comment: Glucose reference range applies only to samples taken after fasting for at least 8 hours.  Comprehensive metabolic panel     Status: Abnormal   Collection Time: 03/12/21  7:00 PM  Result Value Ref Range   Sodium 135 135 - 145 mmol/L   Potassium 4.8 3.5 - 5.1 mmol/L   Chloride 99 98 - 111 mmol/L   CO2 24 22 - 32 mmol/L   Glucose, Bld 190 (H) 70 - 99 mg/dL    Comment: Glucose reference range applies only to samples taken after fasting for at least 8 hours.   BUN 39 (H) 8 - 23 mg/dL   Creatinine, Ser 4.09 (H) 0.44 - 1.00 mg/dL   Calcium 8.7 (L) 8.9 - 10.3 mg/dL   Total Protein 7.0 6.5 - 8.1 g/dL   Albumin 3.2 (L) 3.5 - 5.0 g/dL   AST 22 15 - 41 U/L   ALT 13 0 - 44 U/L   Alkaline Phosphatase  83 38 - 126 U/L   Total Bilirubin 0.7 0.3 - 1.2 mg/dL   GFR, Estimated 11 (L) >60 mL/min    Comment: (NOTE) Calculated using the CKD-EPI Creatinine Equation (2021)    Anion gap 12 5 - 15    Comment: Performed at [facility], [address] [place], [place] [address]  CBC with Differential     Status: Abnormal   Collection Time: 03/12/21  7:00 PM  Result Value Ref Range   WBC 11.8 (H) 4.0 - 10.5 K/uL   RBC 3.08 (L) 3.87 - 5.11 MIL/uL   Hemoglobin 7.9 (L) 12.0 - 15.0 g/dL   HCT 47.8 (L) 29.5 - 62.1 %   MCV 87.0 80.0 - 100.0 fL   MCH 25.6 (L) 26.0 - 34.0 pg   MCHC 29.5 (L) 30.0 - 36.0 g/dL   RDW 30.8 65.7 - 84.6 %   Platelets 446 (H) 150 - 400 K/uL   nRBC 0.0 0.0 - 0.2 %   Neutrophils Relative % 77 %   Neutro Abs 9.1 (H) 1.7 - 7.7 K/uL   Lymphocytes Relative 11 %   Lymphs Abs 1.3 0.7 - 4.0 K/uL   Monocytes Relative 7 %   Monocytes Absolute 0.9 0.1 - 1.0 K/uL   Eosinophils Relative 4 %   Eosinophils Absolute 0.5 0.0 - 0.5 K/uL   Basophils Relative 1 %   Basophils Absolute 0.1 0.0 - 0.1 K/uL   Immature Granulocytes 0 %   Abs Immature Granulocytes 0.05 0.00 - 0.07 K/uL    Comment: Performed at [facility], [address] [place], [place] [address]  Troponin I (High Sensitivity)     Status: None   Collection Time: 03/12/21  7:00 PM  Result Value Ref Range   Troponin I (High Sensitivity) 15 <18 ng/L    Comment: (NOTE) Elevated high sensitivity troponin I (hsTnI) values and significant  changes across serial measurements may suggest ACS but many other  chronic and acute conditions are known to elevate hsTnI results.  Refer to the "Links" section for chest pain algorithms and additional  guidance. Performed at [facility], [address] [place], [place] [address]   Type and screen [facility]     Status: None (Preliminary result)   Collection Time: 03/12/21  7:00 PM  Result Value Ref Range   ABO/RH(D) A POS    Antibody  Screen NEG    Sample Expiration 03/15/2021,2359    Unit Number [id number]    Blood Component Type RCLI PHER 2    Unit division 00    Status of Unit ISSUED  Transfusion Status OK TO TRANSFUSE    Crossmatch Result      Compatible Performed at [facility], [address] [place], [place] [address]   CBG monitoring, ED     Status: Abnormal   Collection Time: 03/12/21 10:35 PM  Result Value Ref Range   Glucose-Capillary 192 (H) 70 - 99 mg/dL    Comment: Glucose reference range applies only to samples taken after fasting for at least 8 hours.  Resp Panel by RT-PCR (Flu A&B, Covid) Nasopharyngeal Swab     Status: None   Collection Time: 03/12/21 11:26 PM   Specimen: Nasopharyngeal Swab; Nasopharyngeal(NP) swabs in vial transport medium  Result Value Ref Range   SARS Coronavirus 2 by RT PCR NEGATIVE NEGATIVE    Comment: (NOTE) SARS-CoV-2 target nucleic acids are NOT DETECTED.  The SARS-CoV-2 RNA is generally detectable in upper respiratory specimens during the acute phase of infection. The lowest concentration of SARS-CoV-2 viral copies this assay can detect is 138 copies/mL. A negative result does not preclude SARS-Cov-2 infection and should not be used as the sole basis for treatment or other patient management decisions. A negative result may occur with  improper specimen collection/handling, submission of specimen other than nasopharyngeal swab, presence of viral mutation(s) within the areas targeted by this assay, and inadequate number of viral copies(<138 copies/mL). A negative result must be combined with clinical observations, patient history, and epidemiological information. The expected result is Negative.  Fact Sheet for Patients:  BloggerCourse.com  Fact Sheet for Healthcare Providers:  SeriousBroker.it  This test is no t yet approved or cleared by the [place] FDA and  has been authorized for  detection and/or diagnosis of SARS-CoV-2 by FDA under an Emergency Use Authorization (EUA). This EUA will remain  in effect (meaning this test can be used) for the duration of the COVID-19 declaration under Section 564(b)(1) of the Act, 21 U.S.C.section 360bbb-3(b)(1), unless the authorization is terminated  or revoked sooner.       Influenza A by PCR NEGATIVE NEGATIVE   Influenza B by PCR NEGATIVE NEGATIVE    Comment: (NOTE) The Xpert Xpress SARS-CoV-2/FLU/RSV plus assay is intended as an aid in the diagnosis of influenza from Nasopharyngeal swab specimens and should not be used as a sole basis for treatment. Nasal washings and aspirates are unacceptable for Xpert Xpress SARS-CoV-2/FLU/RSV testing.  Fact Sheet for Patients: BloggerCourse.com  Fact Sheet for Healthcare Providers: SeriousBroker.it  This test is not yet approved or cleared by the [place] FDA and has been authorized for detection and/or diagnosis of SARS-CoV-2 by FDA under an Emergency Use Authorization (EUA). This EUA will remain in effect (meaning this test can be used) for the duration of the COVID-19 declaration under Section 564(b)(1) of the Act, 21 U.S.C. section 360bbb-3(b)(1), unless the authorization is terminated or revoked.  Performed at [facility], [address] [place], [place] [address]   CBC     Status: Abnormal   Collection Time: 03/12/21 11:26 PM  Result Value Ref Range   WBC 9.8 4.0 - 10.5 K/uL   RBC 2.50 (L) 3.87 - 5.11 MIL/uL   Hemoglobin 6.4 (L) 12.0 - 15.0 g/dL   HCT 01.0 (L) 27.2 - 53.6 %   MCV 87.2 80.0 - 100.0 fL   MCH 25.6 (L) 26.0 - 34.0 pg   MCHC 29.4 (L) 30.0 - 36.0 g/dL   RDW 64.4 03.4 - 74.2 %   Platelets 334 150 - 400 K/uL   nRBC 0.0  0.0 - 0.2 %    Comment: Performed at [facility], [address] [place], [place] [address]  Vitamin B12     Status: Abnormal   Collection Time: 03/12/21 11:26  PM  Result Value Ref Range   Vitamin B-12 3,652 (H) 180 - 914 pg/mL    Comment: (NOTE) This assay is not validated for testing neonatal or myeloproliferative syndrome specimens for Vitamin B12 levels. Performed at [facility], [address], [place], [place] [address]   Folate     Status: None   Collection Time: 03/12/21 11:26 PM  Result Value Ref Range   Folate 9.7 >5.9 ng/mL    Comment: Performed at [facility], [address] [place], [place] [address]  Iron and TIBC     Status: Abnormal   Collection Time: 03/12/21 11:26 PM  Result Value Ref Range   Iron 14 (L) 28 - 170 ug/dL   TIBC 751 700 - 174 ug/dL   Saturation Ratios 4 (L) 10.4 - 31.8 %   UIBC 347 ug/dL    Comment: Performed at [facility], [address] [place], [place] [address]  Ferritin     Status: Abnormal   Collection Time: 03/12/21 11:26 PM  Result Value Ref Range   Ferritin 6 (L) 11 - 307 ng/mL    Comment: Performed at [facility], [address] [place], [place] [address]  Reticulocytes     Status: Abnormal   Collection Time: 03/12/21 11:26 PM  Result Value Ref Range   Retic Ct Pct 1.4 0.4 - 3.1 %   RBC. 2.54 (L) 3.87 - 5.11 MIL/uL   Retic Count, Absolute 35.6 19.0 - 186.0 K/uL   Immature Retic Fract 17.6 (H) 2.3 - 15.9 %    Comment: Performed at [facility], [address] [place], [place] [address]  Troponin I (High Sensitivity)     Status: None   Collection Time: 03/12/21 11:26 PM  Result Value Ref Range   Troponin I (High Sensitivity) 13 <18 ng/L    Comment: (NOTE) Elevated high sensitivity troponin I (hsTnI) values and significant  changes across serial measurements may suggest ACS but many other  chronic and acute conditions are known to elevate hsTnI results.  Refer to the "Links" section for chest pain algorithms and additional  guidance. Performed at [facility], [address] [place], [place] [address]   Prepare RBC  (crossmatch)     Status: None   Collection Time: 03/13/21  2:00 AM  Result Value Ref Range   Order Confirmation      ORDER PROCESSED BY BLOOD BANK Performed at [facility], [address] [place], [place] [address]   Occult blood card to lab, stool     Status: None   Collection Time: 03/13/21  3:15 AM  Result Value Ref Range   Fecal Occult Bld NEGATIVE NEGATIVE    Comment: Performed at [facility], [address] [place], [place] [address]  CBC     Status: Abnormal   Collection Time: 03/13/21  7:41 AM  Result Value Ref Range   WBC 11.5 (H) 4.0 - 10.5 K/uL   RBC 3.19 (L) 3.87 - 5.11 MIL/uL   Hemoglobin 8.2 (L) 12.0 - 15.0 g/dL   HCT 00.9 (L) 23.3 - 00.7 %   MCV 84.6 80.0 - 100.0 fL   MCH 25.7 (L) 26.0 - 34.0 pg   MCHC 30.4 30.0 - 36.0 g/dL   RDW  15.3 11.5 - 15.5 %   Platelets 315 150 - 400 K/uL   nRBC 0.0 0.0 - 0.2 %    Comment: Performed at [facility], [address] [place], [place] [address]  Basic metabolic panel     Status: Abnormal   Collection Time: 03/13/21  7:41 AM  Result Value Ref Range   Sodium 136 135 - 145 mmol/L   Potassium 4.8 3.5 - 5.1 mmol/L   Chloride 101 98 - 111 mmol/L   CO2 25 22 - 32 mmol/L   Glucose, Bld 164 (H) 70 - 99 mg/dL    Comment: Glucose reference range applies only to samples taken after fasting for at least 8 hours.   BUN 41 (H) 8 - 23 mg/dL   Creatinine, Ser 6.04 (H) 0.44 - 1.00 mg/dL   Calcium 8.2 (L) 8.9 - 10.3 mg/dL   GFR, Estimated 13 (L) >60 mL/min    Comment: (NOTE) Calculated using the CKD-EPI Creatinine Equation (2021)    Anion gap 10 5 - 15    Comment: Performed at [facility], [address], [place], [place] [address]  Troponin I (High Sensitivity)     Status: None   Collection Time: 03/13/21  7:41 AM  Result Value Ref Range   Troponin I (High Sensitivity) 14 <18 ng/L    Comment: (NOTE) Elevated high sensitivity troponin I (hsTnI) values and significant  changes across serial  measurements may suggest ACS but many other  chronic and acute conditions are known to elevate hsTnI results.  Refer to the "Links" section for chest pain algorithms and additional  guidance. Performed at [facility], [address] [place], [place] [address]   Lactic acid, plasma     Status: None   Collection Time: 03/13/21  7:41 AM  Result Value Ref Range   Lactic Acid, Venous 1.6 0.5 - 1.9 mmol/L    Comment: Performed at [facility], [address] [place], [place] [address]  CK     Status: None   Collection Time: 03/13/21  7:41 AM  Result Value Ref Range   Total CK 166 38 - 234 U/L    Comment: Performed at [facility], [address] [place], [place] [address]  CBG monitoring, ED     Status: Abnormal   Collection Time: 03/13/21  7:43 AM  Result Value Ref Range   Glucose-Capillary 154 (H) 70 - 99 mg/dL    Comment: Glucose reference range applies only to samples taken after fasting for at least 8 hours.   Comment 1 Notify RN    Comment 2 Document in Chart   Sodium, urine, random     Status: None   Collection Time: 03/13/21  9:46 AM  Result Value Ref Range   Sodium, Ur 59 mmol/L    Comment: Performed at [facility], [address] [place], [place] [address]  Urinalysis, Complete w Microscopic Urine, Clean Catch     Status: Abnormal   Collection Time: 03/13/21  9:46 AM  Result Value Ref Range   Color, Urine YELLOW (A) YELLOW   APPearance HAZY (A) CLEAR   Specific Gravity, Urine 1.015 1.005 - 1.030   pH 5.0 5.0 - 8.0   Glucose, UA NEGATIVE NEGATIVE mg/dL   Hgb urine dipstick NEGATIVE NEGATIVE   Bilirubin Urine NEGATIVE NEGATIVE   Ketones, ur NEGATIVE NEGATIVE mg/dL   Protein, ur NEGATIVE NEGATIVE mg/dL   Nitrite NEGATIVE NEGATIVE   Leukocytes,Ua MODERATE (A) NEGATIVE  RBC / HPF 0-5 0 - 5 RBC/hpf   WBC, UA 0-5 0 - 5 WBC/hpf   Bacteria, UA RARE (A) NONE SEEN   Squamous Epithelial / LPF 0-5 0 - 5   Mucus PRESENT     Comment:  Performed at [facility], [address] [place], [place] [address]  CBG monitoring, ED     Status: Abnormal   Collection Time: 03/13/21  3:16 PM  Result Value Ref Range   Glucose-Capillary 112 (H) 70 - 99 mg/dL    Comment: Glucose reference range applies only to samples taken after fasting for at least 8 hours.   CT ABDOMEN PELVIS WO CONTRAST  Result Date: 03/13/2021 CLINICAL DATA:  Nausea, vomiting, low back pain, weakness EXAM: CT ABDOMEN AND PELVIS WITHOUT CONTRAST TECHNIQUE: Multidetector CT imaging of the abdomen and pelvis was performed following the standard protocol without IV contrast. COMPARISON:  None. FINDINGS: Lower chest: Mild bibasilar atelectasis. Extensive multi-vessel coronary artery calcification. Hypoattenuation of the cardiac blood pool is in keeping with moderate anemia. Moderate hiatal hernia. Hepatobiliary: No focal liver abnormality is seen. No gallstones, gallbladder wall thickening, or biliary dilatation. Pancreas: Unremarkable Spleen: Unremarkable Adrenals/Urinary Tract: The adrenal glands are unremarkable. The kidneys are normal in size and position. 822 mm hyperdense cyst is seen within the upper pole of the right kidney, stable since prior examination. Simple cortical cyst noted arising exophytically from the interpolar region of the right kidney. The kidneys are otherwise unremarkable. Bladder unremarkable. Stomach/Bowel: Stomach is within normal limits. Appendix is absent. Mild sigmoid diverticulosis. No evidence of bowel wall thickening, distention, or inflammatory changes. Vascular/Lymphatic: Moderate aortoiliac atherosclerotic calcification. Previously noted ulcer-like projection within the infrarenal abdominal aorta is not well assessed on this noncontrast examination. No abdominal aortic aneurysm. No pathologic adenopathy within the abdomen and pelvis. Reproductive: Uterus and bilateral adnexa are unremarkable. Other: No abdominal wall hernia.  No free  intraperitoneal fluid. Musculoskeletal: Remote T12 superior endplate fracture with 40-50% loss of height is unchanged. Degenerative changes are noted within the lumbar spine. No acute bone abnormality. IMPRESSION: No acute intra-abdominal pathology identified. No definite radiographic explanation for the patient's reported symptoms. Distal colonic diverticulosis without superimposed acute inflammatory change. Extensive coronary artery calcification. Moderate hiatal hernia. Aortic Atherosclerosis (ICD10-I70.0). Electronically Signed   By: Helyn Numbers M.D.   On: 03/13/2021 03:00   DG Chest 1 View  Result Date: 03/12/2021 CLINICAL DATA:  Weakness, lower back pain EXAM: CHEST  1 VIEW COMPARISON:  10/25/2020 FINDINGS: Single frontal view of the chest demonstrates an unremarkable cardiac silhouette. No acute airspace disease. Minimal blunting of the left costophrenic angle may reflect a small left pleural effusion. No pneumothorax. No acute bony abnormalities. Chronic right rib fractures. IMPRESSION: 1. Trace left pleural effusion versus pleural thickening. 2. Otherwise unremarkable exam. Electronically Signed   By: Sharlet Salina M.D.   On: 03/12/2021 19:06   US Abdomen Limited RUQ (LIVER/GB)  Result Date: 03/13/2021 CLINICAL DATA:  Nausea. EXAM: ULTRASOUND ABDOMEN LIMITED RIGHT UPPER QUADRANT COMPARISON:  CT AP 03/13/2021 FINDINGS: Gallbladder: No gallstones or wall thickening visualized. No sonographic Murphy sign noted by sonographer. Common bile duct: Diameter: 3.8 mm Liver: No focal lesion identified. Within normal limits in parenchymal echogenicity. Portal vein is patent on color Doppler imaging with normal direction of blood flow towards the liver. Other: None. IMPRESSION: Normal exam. Electronically Signed   By: Signa Kell M.D.   On: 03/13/2021 06:02    Pending Labs Unresulted Labs (From admission, onward)  Start     Ordered   03/12/21 2351  Lactic acid, plasma  STAT Now then every 3  hours,   STAT      03/12/21 2350            Vitals/Pain Today's Vitals   03/13/21 1230 03/13/21 1300 03/13/21 1345 03/13/21 1400  BP: (!) 111/57 (!) 116/44  (!) 121/42  Pulse: 79 77 78 81  Resp: (!) 22 (!) 23  20  Temp:      TempSrc:      SpO2: 99% 95% 99% 100%  Weight:      Height:      PainSc:        Isolation Precautions No active isolations  Medications Medications  lactated ringers infusion (0 mLs Intravenous Stopped 03/13/21 0720)  aspirin EC tablet 81 mg (81 mg Oral Given 03/13/21 0942)  atorvastatin (LIPITOR) tablet 80 mg (80 mg Oral Given 03/12/21 2241)  DULoxetine (CYMBALTA) DR capsule 30 mg (30 mg Oral Not Given 03/13/21 0946)  mirtazapine (REMERON) tablet 7.5 mg (0 mg Oral Hold 03/12/21 2246)  insulin glargine-yfgn (SEMGLEE) injection 40 Units (40 Units Subcutaneous Given 03/12/21 2242)  pantoprazole (PROTONIX) EC tablet 40 mg (40 mg Oral Given 03/13/21 0942)  clopidogrel (PLAVIX) tablet 75 mg (75 mg Oral Given 03/13/21 0942)  ferrous sulfate tablet 325 mg (325 mg Oral Given 03/13/21 0942)  gabapentin (NEURONTIN) capsule 600 mg (600 mg Oral Given 03/13/21 0942)  albuterol (PROVENTIL) (2.5 MG/3ML) 0.083% nebulizer solution 3 mL (has no administration in time range)  insulin aspart (novoLOG) injection 0-9 Units (0 Units Subcutaneous Not Given 03/13/21 1148)  acetaminophen (TYLENOL) tablet 650 mg (has no administration in time range)    Or  acetaminophen (TYLENOL) suppository 650 mg (has no administration in time range)  hydrALAZINE (APRESOLINE) injection 10 mg (has no administration in time range)  lactated ringers bolus 1,000 mL (0 mLs Intravenous Stopped 03/12/21 2009)  lactated ringers bolus 1,000 mL (0 mLs Intravenous Stopped 03/13/21 0240)  0.9 %  sodium chloride infusion (Manually program via Guardrails IV Fluids) (0 mLs Intravenous Stopped 03/13/21 0720)    Mobility walks Moderate fall risk   Focused Assessments Cardiac Assessment Handoff:     Lab Results  Component Value Date   CKTOTAL 166 03/13/2021   TROPONINI <0.03 03/05/2016   No results found for: DDIMER Does the Patient currently have chest pain? No    R Recommendations: See Admitting Provider Note  Report given to:   Additional Notes:

## 2021-03-13 NOTE — Progress Notes (Signed)
PROGRESS NOTE    Judy Harvey  ZOX:[id number] DOB: 03/20/1927 DOA: 03/12/2021 PCP: Mick Sell, MD   Brief Narrative:  Judy Harvey is a 87 y.o. female with history of CAD status post stenting, hypertension, diabetes mellitus type 2, legally blind, chronic anemia was brought to the ER after patient was having increased weakness and has had at least 3 falls in the last 1 week.  Denies losing consciousness.  Denies any chest pain but has been some nausea and poor appetite.  In ED patient was noted to be hypotensive with acute on chronic anemia, hospitalist called for admission.   Assessment & Plan:  Acute kidney injury without CKD, POA Concurrent hypotension -In the setting of poor p.o. intake nausea and dehydration -Continue IV fluids, follow repeat labs -Urine studies/culture pending -CT abdomen pelvis and quadrant ultrasound unremarkable for any acute findings -Continue to hold home antihypertensive medications  Acute on chronic anemia, likely multifactorial, POA -Patient has known baseline iron deficiency anemia and chronic anemia of chronic disease -Hemoglobin continues to downtrend overnight (6.4 this morning) 1 unit PRBC transfused today -Reticulocyte count not elevated as expected, will follow repeat morning labs -if hemoglobin continues to downtrend despite negative FOBT no signs or symptoms of bleeding and reticulocyte count remains low will sideline oncology for further evaluation due to concern over bone marrow suppression. -Concern patient may have a production issue more than blood loss  Intractable nausea without vomiting.   Imaging negative as above, advance diet as tolerated  History of CAD status post tenting.  Continue antiplatelets given negative imaging, negative FOBT  Insulin-dependent diabetes type 2  -Continue decreased dose in setting of poor p.o. intake, adjust accordingly -continue hypoglycemic protocol     DVT prophylaxis: SCDs.   Avoiding anticoagulation until we rule out GI bleed. Code Status: No intubation. Family Communication: Discussed with patient.  Status is: Inpatient  Dispo: The patient is from: Home              Anticipated d/c is to: TBD              Anticipated d/c date is: 48-72 hours              Patient currently not medically stable for discharge  Consultants:  None  Procedures:  None  Antimicrobials:  None indicated  Subjective: No acute issues or events overnight, patient's nausea appears to be improving with supportive care, tolerating transfusion quite well this morning otherwise denies any fevers chills or chest pain  Objective: Vitals:   03/13/21 0500 03/13/21 0530 03/13/21 0600 03/13/21 0630  BP: (!) 122/39 122/71 (!) 125/46 (!) 170/143  Pulse: 76 86 76 91  Resp: (!) 24   16  Temp:      TempSrc:      SpO2: 100% 90% 91% (!) 87%  Weight:      Height:        Intake/Output Summary (Last 24 hours) at 03/13/2021 0803 Last data filed at 03/13/2021 0720 Gross per 24 hour  Intake 2675.28 ml  Output --  Net 2675.28 ml   Filed Weights   03/12/21 1840  Weight: 74.8 kg    Examination:  General exam: Appears calm and comfortable  Respiratory system: Clear to auscultation. Respiratory effort normal. Cardiovascular system: S1 & S2 heard, RRR. No JVD, murmurs, rubs, gallops or clicks. No pedal edema. Gastrointestinal system: Abdomen is nondistended, soft and nontender. No organomegaly or masses felt. Normal bowel sounds heard. Central nervous system: Alert  and oriented. No focal neurological deficits. Extremities: Symmetric 5 x 5 power. Skin: No rashes, lesions or ulcers Psychiatry: Judgement and insight appear normal. Mood & affect appropriate.     Data Reviewed: I have personally reviewed following labs and imaging studies  CBC: Recent Labs  Lab 03/12/21 1900 03/12/21 2326  WBC 11.8* 9.8  NEUTROABS 9.1*  --   HGB 7.9* 6.4*  HCT 26.8* 21.8*  MCV 87.0 87.2  PLT  446* 334   Basic Metabolic Panel: Recent Labs  Lab 03/12/21 1900  NA 135  K 4.8  CL 99  CO2 24  GLUCOSE 190*  BUN 39*  CREATININE 3.70*  CALCIUM 8.7*   GFR: Estimated Creatinine Clearance: 9.8 mL/min (A) (by C-G formula based on SCr of 3.7 mg/dL (H)). Liver Function Tests: Recent Labs  Lab 03/12/21 1900  AST 22  ALT 13  ALKPHOS 83  BILITOT 0.7  PROT 7.0  ALBUMIN 3.2*   No results for input(s): LIPASE, AMYLASE in the last 168 hours. No results for input(s): AMMONIA in the last 168 hours. Coagulation Profile: No results for input(s): INR, PROTIME in the last 168 hours. Cardiac Enzymes: No results for input(s): CKTOTAL, CKMB, CKMBINDEX, TROPONINI in the last 168 hours. BNP (last 3 results) No results for input(s): PROBNP in the last 8760 hours. HbA1C: No results for input(s): HGBA1C in the last 72 hours. CBG: Recent Labs  Lab 03/12/21 1839 03/12/21 2235 03/13/21 0743  GLUCAP 184* 192* 154*   Lipid Profile: No results for input(s): CHOL, HDL, LDLCALC, TRIG, CHOLHDL, LDLDIRECT in the last 72 hours. Thyroid Function Tests: No results for input(s): TSH, T4TOTAL, FREET4, T3FREE, THYROIDAB in the last 72 hours. Anemia Panel: Recent Labs    03/12/21 2326  VITAMINB12 3,652*  FOLATE 9.7  FERRITIN 6*  TIBC 361  IRON 14*  RETICCTPCT 1.4   Sepsis Labs: No results for input(s): PROCALCITON, LATICACIDVEN in the last 168 hours.  Recent Results (from the past 240 hour(s))  Resp Panel by RT-PCR (Flu A&B, Covid) Nasopharyngeal Swab     Status: None   Collection Time: 03/12/21 11:26 PM   Specimen: Nasopharyngeal Swab; Nasopharyngeal(NP) swabs in vial transport medium  Result Value Ref Range Status   SARS Coronavirus 2 by RT PCR NEGATIVE NEGATIVE Final    Comment: (NOTE) SARS-CoV-2 target nucleic acids are NOT DETECTED.  The SARS-CoV-2 RNA is generally detectable in upper respiratory specimens during the acute phase of infection. The lowest concentration of  SARS-CoV-2 viral copies this assay can detect is 138 copies/mL. A negative result does not preclude SARS-Cov-2 infection and should not be used as the sole basis for treatment or other patient management decisions. A negative result may occur with  improper specimen collection/handling, submission of specimen other than nasopharyngeal swab, presence of viral mutation(s) within the areas targeted by this assay, and inadequate number of viral copies(<138 copies/mL). A negative result must be combined with clinical observations, patient history, and epidemiological information. The expected result is Negative.  Fact Sheet for Patients:  BloggerCourse.com  Fact Sheet for Healthcare Providers:  SeriousBroker.it  This test is no t yet approved or cleared by the [place] FDA and  has been authorized for detection and/or diagnosis of SARS-CoV-2 by FDA under an Emergency Use Authorization (EUA). This EUA will remain  in effect (meaning this test can be used) for the duration of the COVID-19 declaration under Section 564(b)(1) of the Act, 21 U.S.C.section 360bbb-3(b)(1), unless the authorization is terminated  or revoked sooner.  Influenza A by PCR NEGATIVE NEGATIVE Final   Influenza B by PCR NEGATIVE NEGATIVE Final    Comment: (NOTE) The Xpert Xpress SARS-CoV-2/FLU/RSV plus assay is intended as an aid in the diagnosis of influenza from Nasopharyngeal swab specimens and should not be used as a sole basis for treatment. Nasal washings and aspirates are unacceptable for Xpert Xpress SARS-CoV-2/FLU/RSV testing.  Fact Sheet for Patients: BloggerCourse.com  Fact Sheet for Healthcare Providers: SeriousBroker.it  This test is not yet approved or cleared by the [place] FDA and has been authorized for detection and/or diagnosis of SARS-CoV-2 by FDA under an Emergency Use  Authorization (EUA). This EUA will remain in effect (meaning this test can be used) for the duration of the COVID-19 declaration under Section 564(b)(1) of the Act, 21 U.S.C. section 360bbb-3(b)(1), unless the authorization is terminated or revoked.  Performed at [facility], [address] [place], [place] [address]          Radiology Studies: CT ABDOMEN PELVIS WO CONTRAST  Result Date: 03/13/2021 CLINICAL DATA:  Nausea, vomiting, low back pain, weakness EXAM: CT ABDOMEN AND PELVIS WITHOUT CONTRAST TECHNIQUE: Multidetector CT imaging of the abdomen and pelvis was performed following the standard protocol without IV contrast. COMPARISON:  None. FINDINGS: Lower chest: Mild bibasilar atelectasis. Extensive multi-vessel coronary artery calcification. Hypoattenuation of the cardiac blood pool is in keeping with moderate anemia. Moderate hiatal hernia. Hepatobiliary: No focal liver abnormality is seen. No gallstones, gallbladder wall thickening, or biliary dilatation. Pancreas: Unremarkable Spleen: Unremarkable Adrenals/Urinary Tract: The adrenal glands are unremarkable. The kidneys are normal in size and position. 822 mm hyperdense cyst is seen within the upper pole of the right kidney, stable since prior examination. Simple cortical cyst noted arising exophytically from the interpolar region of the right kidney. The kidneys are otherwise unremarkable. Bladder unremarkable. Stomach/Bowel: Stomach is within normal limits. Appendix is absent. Mild sigmoid diverticulosis. No evidence of bowel wall thickening, distention, or inflammatory changes. Vascular/Lymphatic: Moderate aortoiliac atherosclerotic calcification. Previously noted ulcer-like projection within the infrarenal abdominal aorta is not well assessed on this noncontrast examination. No abdominal aortic aneurysm. No pathologic adenopathy within the abdomen and pelvis. Reproductive: Uterus and bilateral adnexa are unremarkable.  Other: No abdominal wall hernia.  No free intraperitoneal fluid. Musculoskeletal: Remote T12 superior endplate fracture with 40-50% loss of height is unchanged. Degenerative changes are noted within the lumbar spine. No acute bone abnormality. IMPRESSION: No acute intra-abdominal pathology identified. No definite radiographic explanation for the patient's reported symptoms. Distal colonic diverticulosis without superimposed acute inflammatory change. Extensive coronary artery calcification. Moderate hiatal hernia. Aortic Atherosclerosis (ICD10-I70.0). Electronically Signed   By: Helyn Numbers M.D.   On: 03/13/2021 03:00   DG Chest 1 View  Result Date: 03/12/2021 CLINICAL DATA:  Weakness, lower back pain EXAM: CHEST  1 VIEW COMPARISON:  10/25/2020 FINDINGS: Single frontal view of the chest demonstrates an unremarkable cardiac silhouette. No acute airspace disease. Minimal blunting of the left costophrenic angle may reflect a small left pleural effusion. No pneumothorax. No acute bony abnormalities. Chronic right rib fractures. IMPRESSION: 1. Trace left pleural effusion versus pleural thickening. 2. Otherwise unremarkable exam. Electronically Signed   By: Sharlet Salina M.D.   On: 03/12/2021 19:06   US Abdomen Limited RUQ (LIVER/GB)  Result Date: 03/13/2021 CLINICAL DATA:  Nausea. EXAM: ULTRASOUND ABDOMEN LIMITED RIGHT UPPER QUADRANT COMPARISON:  CT AP 03/13/2021 FINDINGS: Gallbladder: No gallstones or wall thickening visualized. No sonographic Murphy sign noted by sonographer. Common bile duct: Diameter: 3.8 mm  Liver: No focal lesion identified. Within normal limits in parenchymal echogenicity. Portal vein is patent on color Doppler imaging with normal direction of blood flow towards the liver. Other: None. IMPRESSION: Normal exam. Electronically Signed   By: Signa Kell M.D.   On: 03/13/2021 06:02    Scheduled Meds:  aspirin EC  81 mg Oral Daily   atorvastatin  80 mg Oral QHS   clopidogrel  75 mg  Oral Daily   DULoxetine  30 mg Oral Daily   ferrous sulfate  325 mg Oral Daily   gabapentin  600 mg Oral TID   insulin aspart  0-9 Units Subcutaneous TID WC   insulin glargine-yfgn  40 Units Subcutaneous QHS   mirtazapine  7.5 mg Oral QHS   pantoprazole  40 mg Oral BID   Continuous Infusions:  lactated ringers Stopped (03/13/21 0720)    LOS: 0 days   Time spent:  Azucena Fallen, DO [facility]  If 7PM-7AM, please contact night-coverage [email or web address]  03/13/2021, 8:03 AM

## 2021-03-14 DIAGNOSIS — N179 Acute kidney failure, unspecified: Secondary | ICD-10-CM | POA: Diagnosis not present

## 2021-03-14 DIAGNOSIS — H548 Legal blindness, as defined in USA: Secondary | ICD-10-CM | POA: Diagnosis not present

## 2021-03-14 DIAGNOSIS — D649 Anemia, unspecified: Secondary | ICD-10-CM | POA: Diagnosis not present

## 2021-03-14 DIAGNOSIS — E119 Type 2 diabetes mellitus without complications: Secondary | ICD-10-CM | POA: Diagnosis not present

## 2021-03-14 LAB — BASIC METABOLIC PANEL
Anion gap: 8 (ref 5–15)
BUN: 42 mg/dL — ABNORMAL HIGH (ref 8–23)
CO2: 28 mmol/L (ref 22–32)
Calcium: 8.8 mg/dL — ABNORMAL LOW (ref 8.9–10.3)
Chloride: 101 mmol/L (ref 98–111)
Creatinine, Ser: 2.94 mg/dL — ABNORMAL HIGH (ref 0.44–1.00)
GFR, Estimated: 15 mL/min — ABNORMAL LOW (ref >60)
Glucose, Bld: 134 mg/dL — ABNORMAL HIGH (ref 70–99)
Potassium: 4.7 mmol/L (ref 3.5–5.1)
Sodium: 137 mmol/L (ref 135–145)

## 2021-03-14 LAB — GLUCOSE, CAPILLARY
Glucose-Capillary: 66 mg/dL — ABNORMAL LOW (ref 70–99)
Glucose-Capillary: 75 mg/dL (ref 70–99)
Glucose-Capillary: 280 mg/dL — ABNORMAL HIGH (ref 70–99)
Glucose-Capillary: 150 mg/dL — ABNORMAL HIGH (ref 70–99)
Glucose-Capillary: 171 mg/dL — ABNORMAL HIGH (ref 70–99)

## 2021-03-14 LAB — CBC
HCT: 30.2 % — ABNORMAL LOW (ref 36.0–46.0)
Hemoglobin: 9.1 g/dL — ABNORMAL LOW (ref 12.0–15.0)
MCH: 25.9 pg — ABNORMAL LOW (ref 26.0–34.0)
MCHC: 30.1 g/dL (ref 30.0–36.0)
MCV: 85.8 fL (ref 80.0–100.0)
Platelets: 311 10*3/uL (ref 150–400)
RBC: 3.52 MIL/uL — ABNORMAL LOW (ref 3.87–5.11)
RDW: 15.6 % — ABNORMAL HIGH (ref 11.5–15.5)
WBC: 9.6 10*3/uL (ref 4.0–10.5)
nRBC: 0 % (ref 0.0–0.2)

## 2021-03-14 LAB — LACTIC ACID, PLASMA: Lactic Acid, Venous: 1.2 mmol/L (ref 0.5–1.9)

## 2021-03-14 MED ORDER — LACTATED RINGERS IV SOLN: Freq: Once | INTRAVENOUS | Status: AC

## 2021-03-14 NOTE — Progress Notes (Signed)
PROGRESS NOTE    Judy Harvey  ZOX:[id number] DOB: 10-22-1927 DOA: 03/12/2021 PCP: Mick SellFitzgerald, David P, MD   Brief Narrative:  Judy MorgansMildred J Harvey is a 88 y.o. female with history of CAD status post stenting, hypertension, diabetes mellitus type 2, legally blind, chronic anemia was brought to the ER after patient was having increased weakness and has had at least 3 falls in the last 1 week.  Denies losing consciousness.  Denies any chest pain but has been some nausea and poor appetite.  In ED patient was noted to be hypotensive with acute on chronic anemia, hospitalist called for admission.   Assessment & Plan:  Acute kidney injury without CKD, POA, ongoing, improving Concurrent hypotension -In the setting of poor Harvey.o. intake nausea and dehydration -Continue IV fluids x1L today, continue to encourage Harvey.o. intake -Son confirms extremely poor Harvey.o. intake at home -Continue to hold home antihypertensive medications  Acute on chronic anemia, likely multifactorial, POA -Patient has known baseline iron deficiency anemia and chronic anemia of chronic disease -Stabilizing after 1 unit PRBC, normal brown bowel movement today per staff -Reticulocyte count not elevated as expected -likely production issue in the setting of malnutrition advanced age.  Intractable nausea without vomiting.   Imaging negative as above, advance diet as tolerated  History of CAD status post tenting.  Continue antiplatelets given negative imaging, negative FOBT  Insulin-dependent diabetes type 2  -Continue decreased dose in setting of poor Harvey.o. intake, adjust accordingly -continue hypoglycemic protocol     DVT prophylaxis: SCDs.  Avoiding anticoagulation until we rule out GI bleed. Code Status: DNR, confirmed with son 03/14/2021 Family Communication: Discussed with patient.  Status is: Inpatient  Dispo: The patient is from: Home              Anticipated d/c is to: Home              Anticipated d/c date is:  48-72 hours              Patient currently not medically stable for discharge  Consultants:  None  Procedures:  None  Antimicrobials:  None indicated  Subjective: No acute issues or events overnight, patient's nausea appears to be improving with supportive care, tolerating transfusion quite well this morning otherwise denies any fevers chills or chest pain  Objective: Vitals:   03/13/21 1843 03/13/21 2112 03/14/21 0102 03/14/21 0346  BP: 121/62 (!) 128/52 96/70 (!) 113/46  Pulse: 80 83 74 73  Resp: 18 18 17 16   Temp: 98.2 F (36.8 C) 98 F (36.7 C) 98.2 F (36.8 C) 98.1 F (36.7 C)  TempSrc:  Oral Oral Oral  SpO2: 100% 98% 99% 100%  Weight:      Height:       No intake or output data in the 24 hours ending 03/14/21 0801  Filed Weights   03/12/21 1840  Weight: 74.8 kg    Examination:  General exam: Appears calm and comfortable  Respiratory system: Clear to auscultation. Respiratory effort normal. Cardiovascular system: S1 & S2 heard, RRR. No JVD, murmurs, rubs, gallops or clicks. No pedal edema. Gastrointestinal system: Abdomen is nondistended, soft and nontender. No organomegaly or masses felt. Normal bowel sounds heard. Central nervous system: Alert and oriented. No focal neurological deficits. Extremities: Symmetric 5 x 5 power. Skin: No rashes, lesions or ulcers Psychiatry: Judgement and insight appear normal. Mood & affect appropriate.     Data Reviewed: I have personally reviewed following labs and imaging studies  CBC: Recent  Labs  Lab 03/12/21 1900 03/12/21 2326 03/13/21 0741  WBC 11.8* 9.8 11.5*  NEUTROABS 9.1*  --   --   HGB 7.9* 6.4* 8.2*  HCT 26.8* 21.8* 27.0*  MCV 87.0 87.2 84.6  PLT 446* 334 315    Basic Metabolic Panel: Recent Labs  Lab 03/12/21 1900 03/13/21 0741  NA 135 136  K 4.8 4.8  CL 99 101  CO2 24 25  GLUCOSE 190* 164*  BUN 39* 41*  CREATININE 3.70* 3.15*  CALCIUM 8.7* 8.2*    GFR: Estimated Creatinine Clearance:  11.5 mL/min (A) (by C-G formula based on SCr of 3.15 mg/dL (H)). Liver Function Tests: Recent Labs  Lab 03/12/21 1900  AST 22  ALT 13  ALKPHOS 83  BILITOT 0.7  PROT 7.0  ALBUMIN 3.2*    No results for input(s): LIPASE, AMYLASE in the last 168 hours. No results for input(s): AMMONIA in the last 168 hours. Coagulation Profile: No results for input(s): INR, PROTIME in the last 168 hours. Cardiac Enzymes: Recent Labs  Lab 03/13/21 0741  CKTOTAL 166   BNP (last 3 results) No results for input(s): PROBNP in the last 8760 hours. HbA1C: No results for input(s): HGBA1C in the last 72 hours. CBG: Recent Labs  Lab 03/12/21 1839 03/12/21 2235 03/13/21 0743 03/13/21 1516 03/13/21 2131  GLUCAP 184* 192* 154* 112* 240*    Lipid Profile: No results for input(s): CHOL, HDL, LDLCALC, TRIG, CHOLHDL, LDLDIRECT in the last 72 hours. Thyroid Function Tests: No results for input(s): TSH, T4TOTAL, FREET4, T3FREE, THYROIDAB in the last 72 hours. Anemia Panel: Recent Labs    03/12/21 2326  VITAMINB12 3,652*  FOLATE 9.7  FERRITIN 6*  TIBC 361  IRON 14*  RETICCTPCT 1.4    Sepsis Labs: Recent Labs  Lab 03/13/21 0741  LATICACIDVEN 1.6    Recent Results (from the past 240 hour(s))  Resp Panel by RT-PCR (Flu A&B, Covid) Nasopharyngeal Swab     Status: None   Collection Time: 03/12/21 11:26 PM   Specimen: Nasopharyngeal Swab; Nasopharyngeal(NP) swabs in vial transport medium  Result Value Ref Range Status   SARS Coronavirus 2 by RT PCR NEGATIVE NEGATIVE Final    Comment: (NOTE) SARS-CoV-2 target nucleic acids are NOT DETECTED.  The SARS-CoV-2 RNA is generally detectable in upper respiratory specimens during the acute phase of infection. The lowest concentration of SARS-CoV-2 viral copies this assay can detect is 138 copies/mL. A negative result does not preclude SARS-Cov-2 infection and should not be used as the sole basis for treatment or other patient management decisions.  A negative result may occur with  improper specimen collection/handling, submission of specimen other than nasopharyngeal swab, presence of viral mutation(s) within the areas targeted by this assay, and inadequate number of viral copies(<138 copies/mL). A negative result must be combined with clinical observations, patient history, and epidemiological information. The expected result is Negative.  Fact Sheet for Patients:  BloggerCourse.comhttps://www.fda.gov/media/152166/download  Fact Sheet for Healthcare Providers:  SeriousBroker.ithttps://www.fda.gov/media/152162/download  This test is no t yet approved or cleared by the [place] FDA and  has been authorized for detection and/or diagnosis of SARS-CoV-2 by FDA under an Emergency Use Authorization (EUA). This EUA will remain  in effect (meaning this test can be used) for the duration of the COVID-19 declaration under Section 564(b)(1) of the Act, 21 U.S.C.section 360bbb-3(b)(1), unless the authorization is terminated  or revoked sooner.       Influenza A by PCR NEGATIVE NEGATIVE Final   Influenza B by  PCR NEGATIVE NEGATIVE Final    Comment: (NOTE) The Xpert Xpress SARS-CoV-2/FLU/RSV plus assay is intended as an aid in the diagnosis of influenza from Nasopharyngeal swab specimens and should not be used as a sole basis for treatment. Nasal washings and aspirates are unacceptable for Xpert Xpress SARS-CoV-2/FLU/RSV testing.  Fact Sheet for Patients: BloggerCourse.com  Fact Sheet for Healthcare Providers: SeriousBroker.it  This test is not yet approved or cleared by the [place] FDA and has been authorized for detection and/or diagnosis of SARS-CoV-2 by FDA under an Emergency Use Authorization (EUA). This EUA will remain in effect (meaning this test can be used) for the duration of the COVID-19 declaration under Section 564(b)(1) of the Act, 21 U.S.C. section 360bbb-3(b)(1), unless the authorization  is terminated or revoked.  Performed at [facility], [address] [place], [place] [address]           Radiology Studies: CT ABDOMEN PELVIS WO CONTRAST  Result Date: 03/13/2021 CLINICAL DATA:  Nausea, vomiting, low back pain, weakness EXAM: CT ABDOMEN AND PELVIS WITHOUT CONTRAST TECHNIQUE: Multidetector CT imaging of the abdomen and pelvis was performed following the standard protocol without IV contrast. COMPARISON:  None. FINDINGS: Lower chest: Mild bibasilar atelectasis. Extensive multi-vessel coronary artery calcification. Hypoattenuation of the cardiac blood pool is in keeping with moderate anemia. Moderate hiatal hernia. Hepatobiliary: No focal liver abnormality is seen. No gallstones, gallbladder wall thickening, or biliary dilatation. Pancreas: Unremarkable Spleen: Unremarkable Adrenals/Urinary Tract: The adrenal glands are unremarkable. The kidneys are normal in size and position. 822 mm hyperdense cyst is seen within the upper pole of the right kidney, stable since prior examination. Simple cortical cyst noted arising exophytically from the interpolar region of the right kidney. The kidneys are otherwise unremarkable. Bladder unremarkable. Stomach/Bowel: Stomach is within normal limits. Appendix is absent. Mild sigmoid diverticulosis. No evidence of bowel wall thickening, distention, or inflammatory changes. Vascular/Lymphatic: Moderate aortoiliac atherosclerotic calcification. Previously noted ulcer-like projection within the infrarenal abdominal aorta is not well assessed on this noncontrast examination. No abdominal aortic aneurysm. No pathologic adenopathy within the abdomen and pelvis. Reproductive: Uterus and bilateral adnexa are unremarkable. Other: No abdominal wall hernia.  No free intraperitoneal fluid. Musculoskeletal: Remote T12 superior endplate fracture with 40-50% loss of height is unchanged. Degenerative changes are noted within the lumbar spine. No acute bone  abnormality. IMPRESSION: No acute intra-abdominal pathology identified. No definite radiographic explanation for the patient's reported symptoms. Distal colonic diverticulosis without superimposed acute inflammatory change. Extensive coronary artery calcification. Moderate hiatal hernia. Aortic Atherosclerosis (ICD10-I70.0). Electronically Signed   By: Helyn Numbers M.D.   On: 03/13/2021 03:00   DG Chest 1 View  Result Date: 03/12/2021 CLINICAL DATA:  Weakness, lower back pain EXAM: CHEST  1 VIEW COMPARISON:  10/25/2020 FINDINGS: Single frontal view of the chest demonstrates an unremarkable cardiac silhouette. No acute airspace disease. Minimal blunting of the left costophrenic angle may reflect a small left pleural effusion. No pneumothorax. No acute bony abnormalities. Chronic right rib fractures. IMPRESSION: 1. Trace left pleural effusion versus pleural thickening. 2. Otherwise unremarkable exam. Electronically Signed   By: Sharlet Salina M.D.   On: 03/12/2021 19:06   US Abdomen Limited RUQ (LIVER/GB)  Result Date: 03/13/2021 CLINICAL DATA:  Nausea. EXAM: ULTRASOUND ABDOMEN LIMITED RIGHT UPPER QUADRANT COMPARISON:  CT AP 03/13/2021 FINDINGS: Gallbladder: No gallstones or wall thickening visualized. No sonographic Murphy sign noted by sonographer. Common bile duct: Diameter: 3.8 mm Liver: No focal lesion identified. Within normal limits in parenchymal echogenicity.  Portal vein is patent on color Doppler imaging with normal direction of blood flow towards the liver. Other: None. IMPRESSION: Normal exam. Electronically Signed   By: Signa Kell M.D.   On: 03/13/2021 06:02    Scheduled Meds:  aspirin EC  81 mg Oral Daily   atorvastatin  80 mg Oral QHS   clopidogrel  75 mg Oral Daily   DULoxetine  30 mg Oral Daily   ferrous sulfate  325 mg Oral Daily   gabapentin  600 mg Oral TID   insulin aspart  0-9 Units Subcutaneous TID WC   insulin glargine-yfgn  40 Units Subcutaneous QHS   mirtazapine   7.5 mg Oral QHS   pantoprazole  40 mg Oral BID   Continuous Infusions:    LOS: 1 day   Time spent:  Azucena Fallen, DO [facility]  If 7PM-7AM, please contact night-coverage [email or web address]  03/14/2021, 8:01 AM

## 2021-03-14 NOTE — Progress Notes (Signed)
Notified Dr. Avon Gully of bladder scan results 596 mL. Verbal order to in and out cath once.

## 2021-03-15 DIAGNOSIS — D649 Anemia, unspecified: Secondary | ICD-10-CM | POA: Diagnosis not present

## 2021-03-15 DIAGNOSIS — H548 Legal blindness, as defined in USA: Secondary | ICD-10-CM | POA: Diagnosis not present

## 2021-03-15 DIAGNOSIS — N179 Acute kidney failure, unspecified: Secondary | ICD-10-CM | POA: Diagnosis not present

## 2021-03-15 DIAGNOSIS — E119 Type 2 diabetes mellitus without complications: Secondary | ICD-10-CM | POA: Diagnosis not present

## 2021-03-15 LAB — CBC
HCT: 26.6 % — ABNORMAL LOW (ref 36.0–46.0)
Hemoglobin: 7.9 g/dL — ABNORMAL LOW (ref 12.0–15.0)
MCH: 25.8 pg — ABNORMAL LOW (ref 26.0–34.0)
MCHC: 29.7 g/dL — ABNORMAL LOW (ref 30.0–36.0)
MCV: 86.9 fL (ref 80.0–100.0)
Platelets: 274 10*3/uL (ref 150–400)
RBC: 3.06 MIL/uL — ABNORMAL LOW (ref 3.87–5.11)
RDW: 15.7 % — ABNORMAL HIGH (ref 11.5–15.5)
WBC: 8.2 10*3/uL (ref 4.0–10.5)
nRBC: 0 % (ref 0.0–0.2)

## 2021-03-15 LAB — TYPE AND SCREEN
ABO/RH(D): A POS
Antibody Screen: NEGATIVE
Unit division: 0

## 2021-03-15 LAB — BPAM RBC
Blood Product Expiration Date: 202301062359
ISSUE DATE / TIME: 202212310241
Unit Type and Rh: 6200

## 2021-03-15 LAB — GLUCOSE, CAPILLARY
Glucose-Capillary: 163 mg/dL — ABNORMAL HIGH (ref 70–99)
Glucose-Capillary: 112 mg/dL — ABNORMAL HIGH (ref 70–99)
Glucose-Capillary: 218 mg/dL — ABNORMAL HIGH (ref 70–99)
Glucose-Capillary: 134 mg/dL — ABNORMAL HIGH (ref 70–99)
Glucose-Capillary: 63 mg/dL — ABNORMAL LOW (ref 70–99)
Glucose-Capillary: 96 mg/dL (ref 70–99)
Glucose-Capillary: 92 mg/dL (ref 70–99)

## 2021-03-15 LAB — BASIC METABOLIC PANEL
Anion gap: 4 — ABNORMAL LOW (ref 5–15)
BUN: 40 mg/dL — ABNORMAL HIGH (ref 8–23)
CO2: 30 mmol/L (ref 22–32)
Calcium: 8.4 mg/dL — ABNORMAL LOW (ref 8.9–10.3)
Chloride: 103 mmol/L (ref 98–111)
Creatinine, Ser: 2.47 mg/dL — ABNORMAL HIGH (ref 0.44–1.00)
GFR, Estimated: 18 mL/min — ABNORMAL LOW (ref >60)
Glucose, Bld: 143 mg/dL — ABNORMAL HIGH (ref 70–99)
Potassium: 5.3 mmol/L — ABNORMAL HIGH (ref 3.5–5.1)
Sodium: 137 mmol/L (ref 135–145)

## 2021-03-15 MED ORDER — SODIUM ZIRCONIUM CYCLOSILICATE 5 G PO PACK
5.0000 g | PACK | Freq: Once | ORAL | Status: AC
  Administered 2021-03-15: 5 g via ORAL
  Filled 2021-03-15: qty 1

## 2021-03-15 NOTE — Progress Notes (Addendum)
PROGRESS NOTE    Judy Harvey  [id number] DOB: [birth year] DOA: 03/12/2021 PCP: Mick SellFitzgerald, David P, MD   Brief Narrative:  Judy Harvey is a 88 y.o. female with history of CAD status post stenting, hypertension, diabetes mellitus type 2, legally blind, chronic anemia was brought to the ER after patient was having increased weakness and has had at least 3 falls in the last 1 week.  Denies losing consciousness.  Denies any chest pain but has been some nausea and poor appetite.  In ED patient was noted to be hypotensive with acute on chronic anemia, hospitalist called for admission.   Assessment & Plan:  Acute kidney injury without CKD, POA, ongoing, improving Concurrent hypotension, resolving -In the setting of poor p.o. intake nausea and dehydration -Continues to improve with increased p.o. intake and IV fluids -Son confirms extremely poor p.o. intake at home -Continue to hold home antihypertensive medications  Acute on chronic anemia, likely multifactorial, POA -Patient has known baseline iron deficiency anemia and chronic anemia of chronic disease -Stabilizing after 1 unit PRBC, normal brown bowel movement today per staff -Reticulocyte count not elevated as expected -likely production issue in the setting of malnutrition advanced age -Follow repeat labs  Urinary retention, unspecified  -Appears to be somewhat acute, In-N-Out cath x1 today if required second in and outs we will place Foley and have outpatient follow-up with urology as scheduled. -No previous history of urinary obstruction per discussion with patient and family -patient declines any pain or otherwise discomfort -only fullness  Acute hypoxic respiratory failure, multifactorial given above I -In the setting of hypotension, anemia  Intractable nausea without vomiting, resolved.   Imaging negative, likely secondary to uremia as above, advance diet as tolerated  History of CAD status post tenting.   Continue antiplatelets given negative imaging, negative FOBT  Insulin-dependent diabetes type 2  -Continue decreased dose in setting of poor p.o. intake, adjust accordingly -continue hypoglycemic protocol     DVT prophylaxis: SCDs.  Avoiding anticoagulation until we formally rule out GI bleed. Code Status: DNR, confirmed with son 03/14/2021 Family Communication: Updated over the phone  Status is: Inpatient  Dispo: The patient is from: Home              Anticipated d/c is to: SNF              Anticipated d/c date is: 24-48 hours              Patient currently not medically stable for discharge -unsafe disposition, unsafe to discharge home given profound weakness ambulatory dysfunction, recommending SNF placement which son is currently agreeable to.  Consultants:  None  Procedures:  None  Antimicrobials:  None indicated  Subjective: No acute issues or events overnight, generally feeling improved but not yet back to baseline, nausea resolved, continues to be profoundly weak with ambulation, PT recommending SNF placement, family agreeable  Objective: Vitals:   03/14/21 1700 03/15/21 0226 03/15/21 0634 03/15/21 0744  BP: (!) 135/51 (!) 130/41 (!) 147/51 (!) 126/53  Pulse: 81 87 87 90  Resp: 18 18 20    Temp: 98.2 F (36.8 C) 98.3 F (36.8 C) 98.3 F (36.8 C)   TempSrc: Oral Oral Oral   SpO2: 97% 97% 99% 97%  Weight:      Height:        Intake/Output Summary (Last 24 hours) at 03/15/2021 0745 Last data filed at 03/14/2021 2200 Gross per 24 hour  Intake 398.3 ml  Output 700 ml  Net -301.7 ml    Filed Weights   03/12/21 1840  Weight: 74.8 kg    Examination:  General:  Pleasantly confused resting in bed, No acute distress. HEENT:  Normocephalic atraumatic.  Sclerae nonicteric, noninjected.  Extraocular movements intact bilaterally. Neck:  Without mass or deformity.  Trachea is midline. Lungs:  Clear to auscultate bilaterally without rhonchi, wheeze, or rales. Heart:   Regular rate and rhythm.  Without murmurs, rubs, or gallops. Abdomen:  Soft, nontender, nondistended.  Without guarding or rebound. Extremities: Without cyanosis, clubbing, edema, or obvious deformity. Vascular:  Dorsalis pedis and posterior tibial pulses palpable bilaterally. Skin:  Warm and dry, no erythema, no ulcerations.  Data Reviewed: I have personally reviewed following labs and imaging studies  CBC: Recent Labs  Lab 03/12/21 1900 03/12/21 2326 03/13/21 0741 03/14/21 0913  WBC 11.8* 9.8 11.5* 9.6  NEUTROABS 9.1*  --   --   --   HGB 7.9* 6.4* 8.2* 9.1*  HCT 26.8* 21.8* 27.0* 30.2*  MCV 87.0 87.2 84.6 85.8  PLT 446* 334 315 311    Basic Metabolic Panel: Recent Labs  Lab 03/12/21 1900 03/13/21 0741 03/14/21 0913  NA 135 136 137  K 4.8 4.8 4.7  CL 99 101 101  CO2 24 25 28   GLUCOSE 190* 164* 134*  BUN 39* 41* 42*  CREATININE 3.70* 3.15* 2.94*  CALCIUM 8.7* 8.2* 8.8*    GFR: Estimated Creatinine Clearance: 12.3 mL/min (A) (by C-G formula based on SCr of 2.94 mg/dL (H)). Liver Function Tests: Recent Labs  Lab 03/12/21 1900  AST 22  ALT 13  ALKPHOS 83  BILITOT 0.7  PROT 7.0  ALBUMIN 3.2*    No results for input(s): LIPASE, AMYLASE in the last 168 hours. No results for input(s): AMMONIA in the last 168 hours. Coagulation Profile: No results for input(s): INR, PROTIME in the last 168 hours. Cardiac Enzymes: Recent Labs  Lab 03/13/21 0741  CKTOTAL 166    BNP (last 3 results) No results for input(s): PROBNP in the last 8760 hours. HbA1C: No results for input(s): HGBA1C in the last 72 hours. CBG: Recent Labs  Lab 03/14/21 0852 03/14/21 1131 03/14/21 1701 03/14/21 2131 03/15/21 0241  GLUCAP 75 280* 150* 171* 163*    Lipid Profile: No results for input(s): CHOL, HDL, LDLCALC, TRIG, CHOLHDL, LDLDIRECT in the last 72 hours. Thyroid Function Tests: No results for input(s): TSH, T4TOTAL, FREET4, T3FREE, THYROIDAB in the last 72 hours. Anemia  Panel: Recent Labs    03/12/21 2326  VITAMINB12 3,652*  FOLATE 9.7  FERRITIN 6*  TIBC 361  IRON 14*  RETICCTPCT 1.4    Sepsis Labs: Recent Labs  Lab 03/13/21 0741 03/14/21 2248  LATICACIDVEN 1.6 1.2     Recent Results (from the past 240 hour(s))  Resp Panel by RT-PCR (Flu A&B, Covid) Nasopharyngeal Swab     Status: None   Collection Time: 03/12/21 11:26 PM   Specimen: Nasopharyngeal Swab; Nasopharyngeal(NP) swabs in vial transport medium  Result Value Ref Range Status   SARS Coronavirus 2 by RT PCR NEGATIVE NEGATIVE Final    Comment: (NOTE) SARS-CoV-2 target nucleic acids are NOT DETECTED.  The SARS-CoV-2 RNA is generally detectable in upper respiratory specimens during the acute phase of infection. The lowest concentration of SARS-CoV-2 viral copies this assay can detect is 138 copies/mL. A negative result does not preclude SARS-Cov-2 infection and should not be used as the sole basis for treatment or other patient management decisions. A negative result may occur  with  improper specimen collection/handling, submission of specimen other than nasopharyngeal swab, presence of viral mutation(s) within the areas targeted by this assay, and inadequate number of viral copies(<138 copies/mL). A negative result must be combined with clinical observations, patient history, and epidemiological information. The expected result is Negative.  Fact Sheet for Patients:  BloggerCourse.com  Fact Sheet for Healthcare Providers:  SeriousBroker.it  This test is no t yet approved or cleared by the [place] FDA and  has been authorized for detection and/or diagnosis of SARS-CoV-2 by FDA under an Emergency Use Authorization (EUA). This EUA will remain  in effect (meaning this test can be used) for the duration of the COVID-19 declaration under Section 564(b)(1) of the Act, 21 U.S.C.section 360bbb-3(b)(1), unless the authorization  is terminated  or revoked sooner.       Influenza A by PCR NEGATIVE NEGATIVE Final   Influenza B by PCR NEGATIVE NEGATIVE Final    Comment: (NOTE) The Xpert Xpress SARS-CoV-2/FLU/RSV plus assay is intended as an aid in the diagnosis of influenza from Nasopharyngeal swab specimens and should not be used as a sole basis for treatment. Nasal washings and aspirates are unacceptable for Xpert Xpress SARS-CoV-2/FLU/RSV testing.  Fact Sheet for Patients: BloggerCourse.com  Fact Sheet for Healthcare Providers: SeriousBroker.it  This test is not yet approved or cleared by the [place] FDA and has been authorized for detection and/or diagnosis of SARS-CoV-2 by FDA under an Emergency Use Authorization (EUA). This EUA will remain in effect (meaning this test can be used) for the duration of the COVID-19 declaration under Section 564(b)(1) of the Act, 21 U.S.C. section 360bbb-3(b)(1), unless the authorization is terminated or revoked.  Performed at [facility], [address] [place], [place] [address]    Radiology Studies: No results found.  Scheduled Meds:  aspirin EC  81 mg Oral Daily   atorvastatin  80 mg Oral QHS   clopidogrel  75 mg Oral Daily   DULoxetine  30 mg Oral Daily   ferrous sulfate  325 mg Oral Daily   gabapentin  600 mg Oral TID   insulin aspart  0-9 Units Subcutaneous TID WC   insulin glargine-yfgn  40 Units Subcutaneous QHS   mirtazapine  7.5 mg Oral QHS   pantoprazole  40 mg Oral BID    LOS: 2 days   Time spent:  Azucena Fallen, DO [facility]  If 7PM-7AM, please contact night-coverage [email or web address]  03/15/2021, 7:45 AM

## 2021-03-15 NOTE — Progress Notes (Signed)
Pt unable to void, pt states has sensation to urinate but unable to, bladder scanned pt yield 750 cc, pt up to [facility] and voided 150 cc, MD [facility] notified and received orders for in/out 700 cc out. Pt tolerated well.

## 2021-03-15 NOTE — Evaluation (Signed)
Physical Therapy Evaluation Patient Details Name: Judy Harvey MRN: [id number] DOB: April 24, 1927 Today's Date: 03/15/2021  History of Present Illness  Patient is a 88 year old female with history of CAD status post stenting, hypertension, diabetes mellitus type 2, legally blind, chronic anemia was brought to the ER after patient was finding increasingly weak and has had at least 3 falls in the last 1 week. Found to have ARF, hypotension, anemia.  Clinical Impression  Patient is agreeable to PT. Spoke with her son on the phone with patient's permission and confirmed prior level of function information. Patient is ambulatory in the home without physical assistance, holding on to furniture at needed and reportedly has home health PT services at baseline. Multiple recent falls also.   Patient currently is not at her baseline level of functional mobility. She needs assistance with bed mobility, standing, and was unable to stand longer than 20 seconds with significant posterior lean while standing. Unsafe to attempt ambulation at this time. At this time, SNF is recommended at discharge to maximize independence and facilitate return to prior level of function. PT will continue to follow to maximize independence and decrease caregiver burden.      Recommendations for follow up therapy are one component of a multi-disciplinary discharge planning process, led by the attending physician.  Recommendations may be updated based on patient status, additional functional criteria and insurance authorization.  Follow Up Recommendations Skilled nursing-short term rehab (<3 hours/day)    Assistance Recommended at Discharge Frequent or constant Supervision/Assistance  Functional Status Assessment Patient has had a recent decline in their functional status and demonstrates the ability to make significant improvements in function in a reasonable and predictable amount of time.  Equipment Recommendations  None  recommended by PT    Recommendations for Other Services       Precautions / Restrictions Precautions Precautions: Fall Precaution Comments: legally blind Restrictions Weight Bearing Restrictions: No      Mobility  Bed Mobility Overal bed mobility: Needs Assistance Bed Mobility: Supine to Sit;Sit to Supine     Supine to sit: Min assist Sit to supine: Mod assist   General bed mobility comments: assistance for BLE and trunk support. verbal and tactile cues for sequencing and task initiation    Transfers Overall transfer level: Needs assistance Equipment used: None Transfers: Sit to/from Stand Sit to Stand: Max assist           General transfer comment: significant posterior lean with lift off from bed. faciliation for anterior weight shifting. patient relying heavily on the bed for posterior leg support. mild dizzziness reported with sitting upright. blood pressure in supine was 118/89 and sitting was 143/63.    Ambulation/Gait               General Gait Details: unable to ambualte at this time due to poor standing balance with posterior lean, generalized LE weakness  Stairs            Wheelchair Mobility    Modified Rankin (Stroke Patients Only)       Balance Overall balance assessment: Needs assistance Sitting-balance support: Feet supported;No upper extremity supported Sitting balance-Leahy Scale: Fair   Postural control: Posterior lean Standing balance support: No upper extremity supported Standing balance-Leahy Scale: Zero Standing balance comment: for posterior lean. standing tolerance of less than 20 seconds  Pertinent Vitals/Pain Pain Assessment: No/denies pain    Home Living Family/patient expects to be discharged to:: Private residence Living Arrangements: Children Available Help at Discharge: Family Type of Home: House Home Access: Stairs to enter     Alternate Level Stairs-Number of  Steps: 2 Home Layout: Two level;Able to live on main level with bedroom/bathroom Home Equipment: Rolling Walker (2 wheels) Additional Comments: patient has home health PT currently per the son. multiple falls the week patient was admitted. spoke with her son on the phone to confirm information.    Prior Function Prior Level of Function : Independent/Modified Independent             Mobility Comments: ambulatory in the home using funiture for support while walking. no physical assistance required       Hand Dominance        Extremity/Trunk Assessment   Upper Extremity Assessment Upper Extremity Assessment: Generalized weakness (patient has jerky movement of bilateral UE (spilled her coffee))    Lower Extremity Assessment Lower Extremity Assessment: Generalized weakness       Communication      Cognition Arousal/Alertness: Awake/alert Behavior During Therapy: WFL for tasks assessed/performed Overall Cognitive Status: Within Functional Limits for tasks assessed                                 General Comments: patient is able to follow commands with increased time. she is hard of hearing        General Comments General comments (skin integrity, edema, etc.): patient was set-up for breakfast with food and items in reach. patient needs set-up due to vision impairments. due to generalized weakness and jerking movements of BUE, lid placed on her coffee for safety.    Exercises     Assessment/Plan    PT Assessment Patient needs continued PT services  PT Problem List Decreased strength;Decreased range of motion;Decreased activity tolerance;Decreased balance;Decreased mobility;Decreased safety awareness       PT Treatment Interventions DME instruction;Gait training;Stair training;Functional mobility training;Balance training;Therapeutic exercise;Therapeutic activities;Neuromuscular re-education;Patient/family education    PT Goals (Current goals can be  found in the Care Plan section)  Acute Rehab PT Goals Patient Stated Goal: to return home PT Goal Formulation: With patient Time For Goal Achievement: 03/29/21 Potential to Achieve Goals: Fair    Frequency Min 2X/week   Barriers to discharge        Co-evaluation               AM-PAC PT "6 Clicks" Mobility  Outcome Measure Help needed turning from your back to your side while in a flat bed without using bedrails?: A Little Help needed moving from lying on your back to sitting on the side of a flat bed without using bedrails?: A Lot Help needed moving to and from a bed to a chair (including a wheelchair)?: A Lot Help needed standing up from a chair using your arms (e.g., wheelchair or bedside chair)?: A Lot Help needed to walk in hospital room?: Total Help needed climbing 3-5 steps with a railing? : Total 6 Click Score: 11    End of Session Equipment Utilized During Treatment: Oxygen Activity Tolerance: Patient limited by fatigue Patient left: in bed;with call bell/phone within reach;with bed alarm set Nurse Communication: Mobility status PT Visit Diagnosis: Muscle weakness (generalized) (M62.81);Unsteadiness on feet (R26.81);Repeated falls (R29.6)    Time: [id number] PT Time Calculation (min) (ACUTE ONLY): 40 min  Charges:   PT Evaluation $PT Eval Moderate Complexity: 1 Mod PT Treatments $Therapeutic Activity: 8-22 mins        Donna Bernard, PT, MPT   Ina Homes 03/15/2021, 10:17 AM

## 2021-03-16 DIAGNOSIS — D649 Anemia, unspecified: Secondary | ICD-10-CM | POA: Diagnosis not present

## 2021-03-16 DIAGNOSIS — E119 Type 2 diabetes mellitus without complications: Secondary | ICD-10-CM | POA: Diagnosis not present

## 2021-03-16 DIAGNOSIS — N179 Acute kidney failure, unspecified: Secondary | ICD-10-CM | POA: Diagnosis not present

## 2021-03-16 DIAGNOSIS — H548 Legal blindness, as defined in USA: Secondary | ICD-10-CM | POA: Diagnosis not present

## 2021-03-16 LAB — GLUCOSE, CAPILLARY
Glucose-Capillary: 102 mg/dL — ABNORMAL HIGH (ref 70–99)
Glucose-Capillary: 76 mg/dL (ref 70–99)
Glucose-Capillary: 114 mg/dL — ABNORMAL HIGH (ref 70–99)
Glucose-Capillary: 136 mg/dL — ABNORMAL HIGH (ref 70–99)
Glucose-Capillary: 172 mg/dL — ABNORMAL HIGH (ref 70–99)

## 2021-03-16 LAB — BASIC METABOLIC PANEL
Anion gap: 5 (ref 5–15)
BUN: 36 mg/dL — ABNORMAL HIGH (ref 8–23)
CO2: 32 mmol/L (ref 22–32)
Calcium: 8.8 mg/dL — ABNORMAL LOW (ref 8.9–10.3)
Chloride: 104 mmol/L (ref 98–111)
Creatinine, Ser: 1.98 mg/dL — ABNORMAL HIGH (ref 0.44–1.00)
GFR, Estimated: 23 mL/min — ABNORMAL LOW (ref >60)
Glucose, Bld: 72 mg/dL (ref 70–99)
Potassium: 5.3 mmol/L — ABNORMAL HIGH (ref 3.5–5.1)
Sodium: 141 mmol/L (ref 135–145)

## 2021-03-16 LAB — CBC
HCT: 27.2 % — ABNORMAL LOW (ref 36.0–46.0)
Hemoglobin: 8 g/dL — ABNORMAL LOW (ref 12.0–15.0)
MCH: 25.5 pg — ABNORMAL LOW (ref 26.0–34.0)
MCHC: 29.4 g/dL — ABNORMAL LOW (ref 30.0–36.0)
MCV: 86.6 fL (ref 80.0–100.0)
Platelets: 254 10*3/uL (ref 150–400)
RBC: 3.14 MIL/uL — ABNORMAL LOW (ref 3.87–5.11)
RDW: 15.5 % (ref 11.5–15.5)
WBC: 7.4 10*3/uL (ref 4.0–10.5)
nRBC: 0 % (ref 0.0–0.2)

## 2021-03-16 MED ORDER — AMLODIPINE BESYLATE 5 MG PO TABS
5.0000 mg | ORAL_TABLET | Freq: Every day | ORAL | Status: DC
  Administered 2021-03-16 – 2021-03-19 (×4): 5 mg via ORAL
  Filled 2021-03-16 (×4): qty 1

## 2021-03-16 MED ORDER — GABAPENTIN 100 MG PO CAPS
100.0000 mg | ORAL_CAPSULE | Freq: Three times a day (TID) | ORAL | Status: DC
  Administered 2021-03-16 – 2021-03-19 (×11): 100 mg via ORAL
  Filled 2021-03-16 (×11): qty 1

## 2021-03-16 MED ORDER — SODIUM CHLORIDE 0.9 % IV SOLN: INTRAVENOUS | Status: AC

## 2021-03-16 MED ORDER — CHLORHEXIDINE GLUCONATE CLOTH 2 % EX PADS
6.0000 | MEDICATED_PAD | Freq: Every day | CUTANEOUS | Status: DC
  Administered 2021-03-16 – 2021-03-19 (×4): 6 via TOPICAL

## 2021-03-16 NOTE — TOC Initial Note (Signed)
Transition of Care [facility]) - Initial/Assessment Note    Patient Details  Name: Judy Harvey MRN: [id number] Date of Birth: 03-27-1927  [facility] of [facility]) CM/SW Contact:    Eileen Stanford, LCSW Phone Number: 03/16/2021, 4:06 PM  Clinical Narrative:  Pt is disoriented to situation. CSW spoke with pt's son who states pt did not admit from [facility] but has been to [facility] in the past. Pt's son would like for pt to go to [facility] again. CSW has sent referral.                 Expected Discharge Plan: Skilled Nursing Facility Barriers to Discharge: Continued Medical Work up   Patient Goals and CMS Choice Patient states their goals for this hospitalization and ongoing recovery are:: for pt to go to snf   Choice offered to / list presented to : Adult Children  Expected Discharge Plan and Services Expected Discharge Plan: [place] In-house Referral: NA   Post Acute Care Choice: [place] Living arrangements for the past 2 months: Single Family Home                                      Prior Living Arrangements/Services Living arrangements for the past 2 months: Single Family Home Lives with:: Adult Children Patient language and need for interpreter reviewed:: Yes Do you feel safe going back to the place where you live?: Yes      Need for Family Participation in Patient Care: Yes (Comment) Care giver support system in place?: Yes (comment)   Criminal Activity/Legal Involvement Pertinent to Current Situation/Hospitalization: No - Comment as needed  Activities of Daily Living Home Assistive Devices/Equipment: None ADL Screening (condition at time of admission) Patient's cognitive ability adequate to safely complete daily activities?: No Is the patient deaf or have difficulty hearing?: Yes Does the patient have difficulty seeing, even when wearing glasses/contacts?: Yes Does the patient have difficulty concentrating, remembering, or making  decisions?: No Patient able to express need for assistance with ADLs?: Yes Does the patient have difficulty dressing or bathing?: No Independently performs ADLs?: Yes (appropriate for developmental age) Does the patient have difficulty walking or climbing stairs?: Yes Weakness of Legs: Both Weakness of Arms/Hands: None  Permission Sought/Granted Permission sought to share information with : Family Supports Permission granted to share information with : Yes, Release of Information Signed  Share Information with NAME: mark  Permission granted to share info w AGENC[facility]: [facility]  Permission granted to share info w Relationship: son     Emotional Assessment Appearance:: Appears stated age Attitude/Demeanor/Rapport: Unable to Assess Affect (typically observed): Unable to Assess Orientation: : Oriented to Self, Oriented to Place, Oriented to  Time Alcohol / Substance Use: Not Applicable Psych Involvement: No (comment)  Admission diagnosis:  ARF (acute renal failure) (HCC) [N17.9] Nausea & vomiting [R11.2] Generalized weakness [R53.1] AKI (acute kidney injury) ([place]) [N17.9] Hypotension due to hypovolemia [I95.89, E86.1] Patient Active Problem List   Diagnosis Date Noted   ARF (acute renal failure) ([place]) 03/12/2021   Hypotension due to hypovolemia    Acute respiratory failure with hypoxia ([place])    Community acquired pneumonia 10/25/2020   Major depressive 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 ([place]) 08/21/2018   Low vitamin B12 level 04/26/2018   Iron deficiency anemia 03/28/2018   Frequent PVCs 03/21/2018   PSVT (paroxysmal supraventricular tachycardia) ([place]) 03/21/2018   Normocytic anemia 02/28/2018  Benign essential HTN 02/20/2018   SOBOE (shortness of breath on exertion) 02/20/2018   Carotid stenosis, symptomatic w/o infarct, left 10/18/2017   TIA (transient ischemic attack) 06/16/2017   Bilateral carotid artery stenosis 06/16/2017   Hyperlipidemia 06/16/2017   Obesity (BMI 30.0-34.9)  05/02/2016   Vitamin D deficiency 03/25/2016   Type 2 diabetes mellitus without complication, with long-term current use of insulin ([place]) 03/24/2016   Old cerebrovascular accident (CVA) without late effect 03/24/2016   Hypertension 03/24/2016   Legally blind 03/24/2016   Macular degeneration 03/24/2016   Constipation 03/24/2016   History of esophageal stricture 03/24/2016   Depression with anxiety 03/24/2016   Restless leg syndrome 03/24/2016   Postherpetic neuralgia 03/24/2016   Gait abnormality 03/24/2016   PCP:  Leonel Ramsay, MD Pharmacy:   [id number] - [place], [place] - [address] [place] [address] Phone: [phone] Fax: [phone]     Social Determinants of Health (SDOH) Interventions    Readmission Risk Interventions No flowsheet data found.

## 2021-03-16 NOTE — Progress Notes (Signed)
Inpatient Diabetes Program Recommendations  AACE/ADA: New Consensus Statement on Inpatient Glycemic Control (2015)  Target Ranges:  Prepandial:   less than 140 mg/dL      [facility] postprandial:   less than 180 mg/dL (1-2 hours)      Critically ill patients:  140 - 180 mg/dL    Latest Reference Range & Units 03/15/21 09:17 03/15/21 11:53 03/15/21 15:11 03/15/21 21:23 03/15/21 22:26  Glucose-Capillary 70 - 99 mg/dL 030 (H) 092 (H)  3 units Novolog  134 (H)  1 unit Novolog  63 (L) 96    Latest Reference Range & Units 03/16/21 07:45  Glucose-Capillary 70 - 99 mg/dL 76     Admit with:  Acute kidney injury without CKD Concurrent hypotension Acute on chronic anemia  History: DM  Home DM Meds: Lantus 50 units QPM       Metformin 1000 mg QPM  Current Orders: Semglee 40 units QHS      Novolog Sensitive Correction Scale/ SSI (0-9 units) TID AC    MD- Note CBG was 63 at bedtime last PM.  Semglee dose was HELD per nursing.  CBG only 76 this AM  Please consider reducing Semglee to 20 units QHS (50% home dose)     --Will follow patient during hospitalization--  Ambrose Finland RN, MSN, CDE Diabetes Coordinator Inpatient Glycemic Control Team Team Pager: [phone] (8a-5p)

## 2021-03-16 NOTE — [place] FL2 (Signed)
[place] MEDICAID FL2 LEVEL OF CARE SCREENING TOOL     IDENTIFICATION  Patient Name: Judy Harvey Birthdate: 01-14-1928 Sex: female Admission Date (Current Location): 03/12/2021  [facility] and [place] Number:  Chiropodist and Address:  [facility], [address] [place], [place] [address]      Provider Number: [id number]  Attending Physician Name and Address:  Azucena Fallen, MD  Relative Name and Phone Number:       Current Level of Care: Hospital Recommended Level of Care: Skilled Nursing Facility Prior Approval Number:    Date Approved/Denied:   PASRR Number: [id number]  Discharge Plan: SNF    Current Diagnoses: Patient Active Problem List   Diagnosis Date Noted   ARF (acute renal failure) (HCC) 03/12/2021   Hypotension due to hypovolemia    Acute respiratory failure with hypoxia (HCC)    Community acquired pneumonia 10/25/2020   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) 08/21/2018   Low vitamin B12 level 04/26/2018   Iron deficiency anemia 03/28/2018   Frequent PVCs 03/21/2018   PSVT (paroxysmal supraventricular tachycardia) (HCC) 03/21/2018   Normocytic anemia 02/28/2018   Benign essential HTN 02/20/2018   SOBOE (shortness of breath on exertion) 02/20/2018   Carotid stenosis, symptomatic w/o infarct, left 10/18/2017   TIA (transient ischemic attack) 06/16/2017   Bilateral carotid artery stenosis 06/16/2017   Hyperlipidemia 06/16/2017   Obesity (BMI 30.0-34.9) 05/02/2016   Vitamin D deficiency 03/25/2016   Type 2 diabetes mellitus without complication, with long-term current use of insulin (HCC) 03/24/2016   Old cerebrovascular accident (CVA) without late effect 03/24/2016   Hypertension 03/24/2016   Legally blind 03/24/2016   Macular degeneration 03/24/2016   Constipation 03/24/2016   History of esophageal stricture 03/24/2016   Depression with anxiety 03/24/2016   Restless leg syndrome  03/24/2016   Postherpetic neuralgia 03/24/2016   Gait abnormality 03/24/2016    Orientation RESPIRATION BLADDER Height & Weight     Place, Time, Self  O2 ([place] 2L) Incontinent Weight: 165 lb (74.8 kg) Height:  5\' 4"  (162.6 cm)  BEHAVIORAL SYMPTOMS/MOOD NEUROLOGICAL BOWEL NUTRITION STATUS      Continent Diet (heart healthy, carb modified, thin lquids)  AMBULATORY STATUS COMMUNICATION OF NEEDS Skin   Extensive Assist Verbally Normal                       Personal Care Assistance Level of Assistance  Bathing, Feeding, Dressing Bathing Assistance: Maximum assistance Feeding assistance: Independent Dressing Assistance: Maximum assistance     Functional Limitations Info  Sight, Hearing, Speech Sight Info: Adequate Hearing Info: Adequate Speech Info: Adequate    SPECIAL CARE FACTORS FREQUENCY  PT (By licensed PT), OT (By licensed OT)     PT Frequency: 5x OT Frequency: 5x            Contractures Contractures Info: Not present    Additional Factors Info  Code Status, Allergies Code Status Info: DNR Allergies Info: Adhesive (Tape), Lyrica (Pregabalin)           Current Medications (03/16/2021):  This is the current hospital active medication list Current Facility-Administered Medications  Medication Dose Route Frequency Provider Last Rate Last Admin   0.9 %  sodium chloride infusion   Intravenous Continuous 05/14/2021, MD 75 mL/hr at 03/16/21 1550 Infusion Verify at 03/16/21 1550   acetaminophen (TYLENOL) tablet 650 mg  650 mg Oral Q6H PRN 05/14/21, MD  Or   acetaminophen (TYLENOL) suppository 650 mg  650 mg Rectal Q6H PRN Eduard Clos, MD       albuterol (PROVENTIL) (2.5 MG/3ML) 0.083% nebulizer solution 3 mL  3 mL Nebulization Q6H PRN Eduard Clos, MD       amLODipine (NORVASC) tablet 5 mg  5 mg Oral Daily Azucena Fallen, MD   5 mg at 03/16/21 1501   aspirin EC tablet 81 mg  81 mg Oral Daily Eduard Clos, MD    81 mg at 03/16/21 0902   atorvastatin (LIPITOR) tablet 80 mg  80 mg Oral QHS Eduard Clos, MD   80 mg at 03/15/21 2127   Chlorhexidine Gluconate Cloth 2 % PADS 6 each  6 each Topical Daily Azucena Fallen, MD   6 each at 03/16/21 1457   clopidogrel (PLAVIX) tablet 75 mg  75 mg Oral Daily Eduard Clos, MD   75 mg at 03/16/21 0902   DULoxetine (CYMBALTA) DR capsule 30 mg  30 mg Oral Daily Eduard Clos, MD   30 mg at 03/16/21 0901   ferrous sulfate tablet 325 mg  325 mg Oral Daily Eduard Clos, MD   325 mg at 03/16/21 0902   gabapentin (NEURONTIN) capsule 100 mg  100 mg Oral TID Azucena Fallen, MD   100 mg at 03/16/21 1501   hydrALAZINE (APRESOLINE) injection 10 mg  10 mg Intravenous Q4H PRN Eduard Clos, MD       insulin aspart (novoLOG) injection 0-9 Units  0-9 Units Subcutaneous TID WC Eduard Clos, MD   1 Units at 03/15/21 1555   insulin glargine-yfgn (SEMGLEE) injection 40 Units  40 Units Subcutaneous QHS Eduard Clos, MD   40 Units at 03/14/21 2254   mirtazapine (REMERON) tablet 7.5 mg  7.5 mg Oral QHS Eduard Clos, MD   7.5 mg at 03/15/21 2127   pantoprazole (PROTONIX) EC tablet 40 mg  40 mg Oral BID Eduard Clos, MD   40 mg at 03/16/21 0901     Discharge Medications: Please see discharge summary for a list of discharge medications.  Relevant Imaging Results:  Relevant Lab Results:   Additional Information SSN: [id number]  Maree Krabbe, LCSW

## 2021-03-16 NOTE — Progress Notes (Signed)
Physical Therapy Treatment Patient Details Name: Judy Harvey MRN: [id number] DOB: Sep 12, 1927 Today's Date: 03/16/2021   History of Present Illness Patient is a 88 year old female with history of CAD status post stenting, hypertension, diabetes mellitus type 2, legally blind, chronic anemia was brought to the ER after patient was finding increasingly weak and has had at least 3 falls in the last 1 week. Found to have ARF, hypotension, anemia.    PT Comments    Patient groggy but able to participate with mobility efforts with stimulation to remain awake. She continues to require assistance for bed mobility and was unable to stand today despite assistance. She is not at her baseline level of functional mobility with SNF placement recommended at discharge for continued PT before returning home. Recommend to continue PT to maximize independence and facilitate return to prior level of function.     Recommendations for follow up therapy are one component of a multi-disciplinary discharge planning process, led by the attending physician.  Recommendations may be updated based on patient status, additional functional criteria and insurance authorization.  Follow Up Recommendations  Skilled nursing-short term rehab (<3 hours/day)     Assistance Recommended at Discharge Frequent or constant Supervision/Assistance  Patient can return home with the following Two people to help with walking and/or transfers;A lot of help with bathing/dressing/bathroom;Assistance with feeding;Help with stairs or ramp for entrance   Equipment Recommendations  None recommended by PT    Recommendations for Other Services       Precautions / Restrictions Precautions Precautions: Fall Restrictions Weight Bearing Restrictions: No     Mobility  Bed Mobility Overal bed mobility: Needs Assistance Bed Mobility: Supine to Sit;Sit to Supine     Supine to sit: Max assist Sit to supine: Max assist   General bed  mobility comments: assistance for trunk and BLE support. verbal cues for task initiation and initiation    Transfers                   General transfer comment: attempted sit to stand transfer from bed but patient was unable. patient fatigued with minimal activity today. Sp02 95%    Ambulation/Gait                   Stairs             Wheelchair Mobility    Modified Rankin (Stroke Patients Only)       Balance Overall balance assessment: Needs assistance Sitting-balance support: Feet supported;No upper extremity supported Sitting balance-Leahy Scale: Fair (poor progressing to fair) Sitting balance - Comments: jerking movements noted of upper extremities (patient also had this yesterday). occaional minimal assistance initially progressing to stand by assistance with increased sitting time.                                    Cognition Arousal/Alertness: Lethargic Behavior During Therapy: WFL for tasks assessed/performed Overall Cognitive Status: Within Functional Limits for tasks assessed                                 General Comments: patient is groggy but able to follow commands with increased time. she is very hard of hearing        Exercises      General Comments        Pertinent Vitals/Pain Pain  Assessment: No/denies pain    Home Living                          Prior Function            PT Goals (current goals can now be found in the care plan section) Acute Rehab PT Goals Patient Stated Goal: to return home PT Goal Formulation: With patient Time For Goal Achievement: 03/29/21 Potential to Achieve Goals: Fair Progress towards PT goals: Progressing toward goals    Frequency    Min 2X/week      PT Plan Current plan remains appropriate    Co-evaluation              AM-PAC PT "6 Clicks" Mobility   Outcome Measure  Help needed turning from your back to your side while in a  flat bed without using bedrails?: A Little Help needed moving from lying on your back to sitting on the side of a flat bed without using bedrails?: A Lot Help needed moving to and from a bed to a chair (including a wheelchair)?: A Lot Help needed standing up from a chair using your arms (e.g., wheelchair or bedside chair)?: A Lot Help needed to walk in hospital room?: Total Help needed climbing 3-5 steps with a railing? : Total 6 Click Score: 11    End of Session Equipment Utilized During Treatment: Oxygen Activity Tolerance: Patient limited by fatigue Patient left: in bed;with call bell/phone within reach;with bed alarm set   PT Visit Diagnosis: Muscle weakness (generalized) (M62.81);Unsteadiness on feet (R26.81);Repeated falls (R29.6)     Time: 3299-2426 PT Time Calculation (min) (ACUTE ONLY): 18 min  Charges:  $Therapeutic Activity: 8-22 mins                     Donna Bernard, PT, MPT    Ina Homes 03/16/2021, 2:12 PM

## 2021-03-16 NOTE — Care Management Important Message (Signed)
Important Message  Patient Details  Name: Judy Harvey MRN: [id number] Date of Birth: July 04, 1927   Medicare Important Message Given:  N/A - LOS <3 / Initial given by admissions     [name] 03/16/2021, 8:55 AM

## 2021-03-16 NOTE — Progress Notes (Signed)
PROGRESS NOTE    SHENAY TORTI  UDJ:[id number] DOB: August 26, 1927 DOA: 03/12/2021 PCP: Mick Sell, MD   Brief Narrative:  JALEY Judy Harvey is a 88 y.o. female with history of CAD status post stenting, hypertension, diabetes mellitus type 2, legally blind, chronic anemia was brought to the ER after patient was having increased weakness and has had at least 3 falls in the last 1 week.  Denies losing consciousness.  Denies any chest pain but has been some nausea and poor appetite.  In ED patient was noted to be hypotensive with acute on chronic anemia, hospitalist called for admission.   Assessment & Plan:  Acute kidney injury without CKD, POA, ongoing, improving Concurrent hypotension, resolving -In the setting of poor p.o. intake nausea and dehydration -Continues to improve with increased p.o. intake and IV fluids -continue maintenance fluids today, patient's p.o. intake continues to be an adequate -Son confirms extremely poor p.o. intake at home -Initially holding antihypertensives at intake due to hypotension, restart amlodipine today, continue to titrate home medications as indicated.  Acute on chronic ambulatory dysfunction, POA -Patient does not use assistive device at baseline but is blind and son indicates that she gets around the house by holding onto walls and furniture -Profoundly weak year unable to stand with physical therapy recommending PT at SNF, son agreeable  Acute on chronic anemia, likely multifactorial, POA -Patient has known baseline iron deficiency anemia and chronic anemia of chronic disease -Stabilizing after 1 unit PRBC on 12/31, normal brown bowel movement per staff -Reticulocyte count not elevated as expected -likely production issue in the setting of malnutrition, iron deficiency, advanced age -Follow repeat labs  Urinary retention, unspecified  -Appears to be somewhat acute issue -We will place Foley if unable to void again today with planned  outpatient follow-up with urology -No previous history of urinary obstruction per discussion with patient and family  Acute hypoxic respiratory failure, multifactorial given above -In the setting of hypotension, anemia -Ambulatory oxygen screen quite limited given patient's acute on chronic ambulatory dysfunction  Intractable nausea without vomiting, resolved.   Imaging negative, likely secondary to uremia as above, advance diet as tolerated  History of CAD status post tenting.  Continue antiplatelets given negative imaging, negative FOBT  Insulin-dependent diabetes type 2  -Continue decreased dose in setting of poor p.o. intake, adjust accordingly -continue hypoglycemic protocol     DVT prophylaxis: SCDs.  Avoiding anticoagulation until we formally rule out GI bleed. Code Status: DNR, confirmed with son 03/14/2021 Family Communication: Updated over the phone  Status is: Inpatient  Dispo: The patient is from: Home              Anticipated d/c is to: SNF              Anticipated d/c date is: 24-48 hours              Patient currently not medically stable for discharge -unsafe disposition, unsafe to discharge home given profound weakness ambulatory dysfunction, recommending SNF placement which son is currently agreeable to.  Consultants:  None  Procedures:  None  Antimicrobials:  None indicated  Subjective: No acute issues or events overnight, generally feeling improved but not yet back to baseline, nausea resolved, continues to be profoundly weak with ambulation, PT recommending SNF placement, family agreeable  Objective: Vitals:   03/15/21 1954 03/15/21 2321 03/16/21 0530 03/16/21 0744  BP: (!) 141/50 139/78 132/78 (!) 156/59  Pulse: 83 82 80 84  Resp: 18 18 18  18  Temp: 98.5 F (36.9 C) 98.5 F (36.9 C) 98.6 F (37 C) 98.4 F (36.9 C)  TempSrc: Oral  Oral   SpO2: 98% 100% 100% 99%  Weight:      Height:        Intake/Output Summary (Last 24 hours) at 03/16/2021  0746 Last data filed at 03/16/2021 [id number] Gross per 24 hour  Intake --  Output 1350 ml  Net -1350 ml    Filed Weights   03/12/21 1840  Weight: 74.8 kg    Examination:  General:  Pleasantly confused resting in bed, No acute distress. HEENT:  Normocephalic atraumatic.  Sclerae nonicteric, noninjected. Neck:  Without mass or deformity.  Trachea is midline. Lungs:  Clear to auscultate bilaterally without rhonchi, wheeze, or rales. Heart:  Regular rate and rhythm.  Without murmurs, rubs, or gallops. Abdomen:  Soft, nontender, nondistended.  Without guarding or rebound. Extremities: Without cyanosis, clubbing, edema, or obvious deformity. Vascular:  Dorsalis pedis and posterior tibial pulses palpable bilaterally. Skin:  Warm and dry, no erythema, no ulcerations.  Data Reviewed: I have personally reviewed following labs and imaging studies  CBC: Recent Labs  Lab 03/12/21 1900 03/12/21 2326 03/13/21 0741 03/14/21 0913 03/15/21 0717 03/16/21 0542  WBC 11.8* 9.8 11.5* 9.6 8.2 7.4  NEUTROABS 9.1*  --   --   --   --   --   HGB 7.9* 6.4* 8.2* 9.1* 7.9* 8.0*  HCT 26.8* 21.8* 27.0* 30.2* 26.6* 27.2*  MCV 87.0 87.2 84.6 85.8 86.9 86.6  PLT 446* 334 315 311 274 254    Basic Metabolic Panel: Recent Labs  Lab 03/12/21 1900 03/13/21 0741 03/14/21 0913 03/15/21 0717  NA 135 136 137 137  K 4.8 4.8 4.7 5.3*  CL 99 101 101 103  CO2 24 25 28 30   GLUCOSE 190* 164* 134* 143*  BUN 39* 41* 42* 40*  CREATININE 3.70* 3.15* 2.94* 2.47*  CALCIUM 8.7* 8.2* 8.8* 8.4*    GFR: Estimated Creatinine Clearance: 14.7 mL/min (A) (by C-G formula based on SCr of 2.47 mg/dL (H)). Liver Function Tests: Recent Labs  Lab 03/12/21 1900  AST 22  ALT 13  ALKPHOS 83  BILITOT 0.7  PROT 7.0  ALBUMIN 3.2*    No results for input(s): LIPASE, AMYLASE in the last 168 hours. No results for input(s): AMMONIA in the last 168 hours. Coagulation Profile: No results for input(s): INR, PROTIME in the last  168 hours. Cardiac Enzymes: Recent Labs  Lab 03/13/21 0741  CKTOTAL 166    BNP (last 3 results) No results for input(s): PROBNP in the last 8760 hours. HbA1C: No results for input(s): HGBA1C in the last 72 hours. CBG: Recent Labs  Lab 03/15/21 1511 03/15/21 2123 03/15/21 2226 03/15/21 2321 03/16/21 0745  GLUCAP 134* 63* 96 92 76    Lipid Profile: No results for input(s): CHOL, HDL, LDLCALC, TRIG, CHOLHDL, LDLDIRECT in the last 72 hours. Thyroid Function Tests: No results for input(s): TSH, T4TOTAL, FREET4, T3FREE, THYROIDAB in the last 72 hours. Anemia Panel: No results for input(s): VITAMINB12, FOLATE, FERRITIN, TIBC, IRON, RETICCTPCT in the last 72 hours.  Sepsis Labs: Recent Labs  Lab 03/13/21 0741 03/14/21 2248  LATICACIDVEN 1.6 1.2     Recent Results (from the past 240 hour(s))  Resp Panel by RT-PCR (Flu A&B, Covid) Nasopharyngeal Swab     Status: None   Collection Time: 03/12/21 11:26 PM   Specimen: Nasopharyngeal Swab; Nasopharyngeal(NP) swabs in vial transport medium  Result Value Ref  Range Status   SARS Coronavirus 2 by RT PCR NEGATIVE NEGATIVE Final    Comment: (NOTE) SARS-CoV-2 target nucleic acids are NOT DETECTED.  The SARS-CoV-2 RNA is generally detectable in upper respiratory specimens during the acute phase of infection. The lowest concentration of SARS-CoV-2 viral copies this assay can detect is 138 copies/mL. A negative result does not preclude SARS-Cov-2 infection and should not be used as the sole basis for treatment or other patient management decisions. A negative result may occur with  improper specimen collection/handling, submission of specimen other than nasopharyngeal swab, presence of viral mutation(s) within the areas targeted by this assay, and inadequate number of viral copies(<138 copies/mL). A negative result must be combined with clinical observations, patient history, and epidemiological information. The expected result is  Negative.  Fact Sheet for Patients:  BloggerCourse.com  Fact Sheet for Healthcare Providers:  SeriousBroker.it  This test is no t yet approved or cleared by the [place] FDA and  has been authorized for detection and/or diagnosis of SARS-CoV-2 by FDA under an Emergency Use Authorization (EUA). This EUA will remain  in effect (meaning this test can be used) for the duration of the COVID-19 declaration under Section 564(b)(1) of the Act, 21 U.S.C.section 360bbb-3(b)(1), unless the authorization is terminated  or revoked sooner.       Influenza A by PCR NEGATIVE NEGATIVE Final   Influenza B by PCR NEGATIVE NEGATIVE Final    Comment: (NOTE) The Xpert Xpress SARS-CoV-2/FLU/RSV plus assay is intended as an aid in the diagnosis of influenza from Nasopharyngeal swab specimens and should not be used as a sole basis for treatment. Nasal washings and aspirates are unacceptable for Xpert Xpress SARS-CoV-2/FLU/RSV testing.  Fact Sheet for Patients: BloggerCourse.com  Fact Sheet for Healthcare Providers: SeriousBroker.it  This test is not yet approved or cleared by the [place] FDA and has been authorized for detection and/or diagnosis of SARS-CoV-2 by FDA under an Emergency Use Authorization (EUA). This EUA will remain in effect (meaning this test can be used) for the duration of the COVID-19 declaration under Section 564(b)(1) of the Act, 21 U.S.C. section 360bbb-3(b)(1), unless the authorization is terminated or revoked.  Performed at [facility], [address] [place], [place] [address]    Radiology Studies: No results found.  Scheduled Meds:  aspirin EC  81 mg Oral Daily   atorvastatin  80 mg Oral QHS   clopidogrel  75 mg Oral Daily   DULoxetine  30 mg Oral Daily   ferrous sulfate  325 mg Oral Daily   gabapentin  100 mg Oral TID   insulin aspart  0-9  Units Subcutaneous TID WC   insulin glargine-yfgn  40 Units Subcutaneous QHS   mirtazapine  7.5 mg Oral QHS   pantoprazole  40 mg Oral BID    LOS: 3 days   Time spent:  Azucena Fallen, DO [facility]  If 7PM-7AM, please contact night-coverage [email or web address]  03/16/2021, 7:46 AM

## 2021-03-17 DIAGNOSIS — N179 Acute kidney failure, unspecified: Secondary | ICD-10-CM | POA: Diagnosis not present

## 2021-03-17 LAB — BASIC METABOLIC PANEL
Anion gap: 8 (ref 5–15)
BUN: 31 mg/dL — ABNORMAL HIGH (ref 8–23)
CO2: 31 mmol/L (ref 22–32)
Calcium: 8.7 mg/dL — ABNORMAL LOW (ref 8.9–10.3)
Chloride: 103 mmol/L (ref 98–111)
Creatinine, Ser: 1.63 mg/dL — ABNORMAL HIGH (ref 0.44–1.00)
GFR, Estimated: 30 mL/min — ABNORMAL LOW (ref >60)
Glucose, Bld: 119 mg/dL — ABNORMAL HIGH (ref 70–99)
Potassium: 5.4 mmol/L — ABNORMAL HIGH (ref 3.5–5.1)
Sodium: 142 mmol/L (ref 135–145)

## 2021-03-17 LAB — GLUCOSE, CAPILLARY
Glucose-Capillary: 117 mg/dL — ABNORMAL HIGH (ref 70–99)
Glucose-Capillary: 245 mg/dL — ABNORMAL HIGH (ref 70–99)
Glucose-Capillary: 153 mg/dL — ABNORMAL HIGH (ref 70–99)
Glucose-Capillary: 153 mg/dL — ABNORMAL HIGH (ref 70–99)

## 2021-03-17 LAB — CBC
HCT: 28.4 % — ABNORMAL LOW (ref 36.0–46.0)
Hemoglobin: 8.3 g/dL — ABNORMAL LOW (ref 12.0–15.0)
MCH: 25.4 pg — ABNORMAL LOW (ref 26.0–34.0)
MCHC: 29.2 g/dL — ABNORMAL LOW (ref 30.0–36.0)
MCV: 86.9 fL (ref 80.0–100.0)
Platelets: 241 10*3/uL (ref 150–400)
RBC: 3.27 MIL/uL — ABNORMAL LOW (ref 3.87–5.11)
RDW: 15.4 % (ref 11.5–15.5)
WBC: 7.9 10*3/uL (ref 4.0–10.5)
nRBC: 0 % (ref 0.0–0.2)

## 2021-03-17 LAB — POTASSIUM: Potassium: 5.2 mmol/L — ABNORMAL HIGH (ref 3.5–5.1)

## 2021-03-17 MED ORDER — ENOXAPARIN SODIUM 30 MG/0.3ML IJ SOSY
30.0000 mg | PREFILLED_SYRINGE | INTRAMUSCULAR | Status: DC
  Administered 2021-03-17 – 2021-03-19 (×3): 30 mg via SUBCUTANEOUS
  Filled 2021-03-17 (×3): qty 0.3

## 2021-03-17 MED ORDER — ONDANSETRON HCL 4 MG/2ML IJ SOLN
INTRAMUSCULAR | Status: AC
  Filled 2021-03-17: qty 2

## 2021-03-17 MED ORDER — ONDANSETRON HCL 4 MG/2ML IJ SOLN
4.0000 mg | Freq: Four times a day (QID) | INTRAMUSCULAR | Status: DC | PRN
  Administered 2021-03-17 – 2021-03-18 (×2): 4 mg via INTRAVENOUS
  Filled 2021-03-17: qty 2

## 2021-03-17 MED ORDER — SODIUM ZIRCONIUM CYCLOSILICATE 10 G PO PACK
10.0000 g | PACK | Freq: Three times a day (TID) | ORAL | Status: AC
  Administered 2021-03-17 – 2021-03-18 (×3): 10 g via ORAL
  Filled 2021-03-17 (×3): qty 1

## 2021-03-17 MED ORDER — SODIUM CHLORIDE 0.9 % IV SOLN: INTRAVENOUS | Status: AC

## 2021-03-17 NOTE — Progress Notes (Signed)
PROGRESS NOTE    JUSTICE GIACOMO  [id number] DOB: 1927-03-15 DOA: 03/12/2021 PCP: Leonel Ramsay, MD   Brief Narrative:  Judy Harvey is a 88 y.o. female with history of CAD status post stenting, hypertension, diabetes mellitus type 2, legally blind, chronic anemia was brought to the ER after patient was having increased weakness and has had at least 3 falls in the last 1 week.  Denies losing consciousness.  Denies any chest pain but has been some nausea and poor appetite.  In ED patient was noted to be hypotensive with acute on chronic anemia, hospitalist called for admission.   Assessment & Plan:  # Acute kidney injury Baseline creatinine ~1. Here 3.7 on admission improving with IVF this morning is 1.63. Son confirmed poor PO at home. - continue IVF - encourage PO  # Hyperkalemia 2/2 AKI. 5.4 today - start lokelma 10 tid for 3 doses - monitor bid - IVF as above  # Acute urinary retention CT on admission w/o signs obstruction. Foley placed 1/3 - maintain foley - outpt urology f/u 2 wks  # Acute on chronic iron deficiency anemia No bleeding noted. Received 1 unit here. Labs showing iron deficiency. Hgb stable in the 8s. Fecal occult blood neg - monitor - consider outpt GI f/u  # CAD  # Hx CVA - cont asa, statin, plavix  # Carotid stenosis S/p stent - continue asa, plavix, statin  # Nausea/vomiting Now tolerating diet  # Acute hypoxic respiratory failure O2 wnl on 2 L breathing comfortably. No infiltrate or edema on admission cxr - have messaged nursing about weaning further  # T2DM Glucose wnl - cont ssi    DVT prophylaxis: lovenox Code Status: DNR, confirmed with son 03/14/2021 Family Communication: Updated over the phone 1/4  Status is: Inpatient  Dispo: The patient is from: Home              Anticipated d/c is to: SNF              Anticipated d/c date is: 48-72 hours              Patient currently not medically stable for discharge  -unsafe disposition, unsafe to discharge home given profound weakness ambulatory dysfunction, recommending SNF placement which son is currently agreeable to.  Consultants:  None  Procedures:  None  Antimicrobials:  None indicated  Subjective: No acute issues or events overnight, tolerating diet, no pain  Objective: Vitals:   03/16/21 1927 03/16/21 2247 03/17/21 0415 03/17/21 0853  BP: (!) 128/100 95/60 (!) 156/74 (!) 121/102  Pulse: 89 88 88 84  Resp: 18 20 18 18   Temp: 98.2 F (36.8 C) 97.8 F (36.6 C) 98.3 F (36.8 C) 98.1 F (36.7 C)  TempSrc: Oral Oral Axillary   SpO2: 99% 100% 100% 100%  Weight:      Height:        Intake/Output Summary (Last 24 hours) at 03/17/2021 1213 Last data filed at 03/17/2021 1049 Gross per 24 hour  Intake 1595.6 ml  Output 1575 ml  Net 20.6 ml   Filed Weights   03/12/21 1840  Weight: 74.8 kg    Examination:  General:  Pleasantly confused resting in bed, No acute distress. HEENT:  Normocephalic atraumatic.  Sclerae nonicteric, noninjected. Neck:  Without mass or deformity.  Trachea is midline. Lungs:  Clear to auscultate bilaterally without rhonchi, wheeze, or rales. Heart:  Regular rate and rhythm.  Without murmurs, rubs, or gallops. Abdomen:  Soft,  nontender, nondistended.  Without guarding or rebound. Extremities: Without cyanosis, clubbing, edema, or obvious deformity. Vascular:  Dorsalis pedis and posterior tibial pulses palpable bilaterally. Skin:  Warm and dry, no erythema, no ulcerations.  Data Reviewed: I have personally reviewed following labs and imaging studies  CBC: Recent Labs  Lab 03/12/21 1900 03/12/21 2326 03/13/21 0741 03/14/21 0913 03/15/21 0717 03/16/21 0542 03/17/21 0532  WBC 11.8*   < > 11.5* 9.6 8.2 7.4 7.9  NEUTROABS 9.1*  --   --   --   --   --   --   HGB 7.9*   < > 8.2* 9.1* 7.9* 8.0* 8.3*  HCT 26.8*   < > 27.0* 30.2* 26.6* 27.2* 28.4*  MCV 87.0   < > 84.6 85.8 86.9 86.6 86.9  PLT 446*   < >  315 311 274 254 241   < > = values in this interval not displayed.   Basic Metabolic Panel: Recent Labs  Lab 03/13/21 0741 03/14/21 0913 03/15/21 0717 03/16/21 0542 03/17/21 0532  NA 136 137 137 141 142  K 4.8 4.7 5.3* 5.3* 5.4*  CL 101 101 103 104 103  CO2 25 28 30  32 31  GLUCOSE 164* 134* 143* 72 119*  BUN 41* 42* 40* 36* 31*  CREATININE 3.15* 2.94* 2.47* 1.98* 1.63*  CALCIUM 8.2* 8.8* 8.4* 8.8* 8.7*   GFR: Estimated Creatinine Clearance: 22.3 mL/min (A) (by C-G formula based on SCr of 1.63 mg/dL (H)). Liver Function Tests: Recent Labs  Lab 03/12/21 1900  AST 22  ALT 13  ALKPHOS 83  BILITOT 0.7  PROT 7.0  ALBUMIN 3.2*   No results for input(s): LIPASE, AMYLASE in the last 168 hours. No results for input(s): AMMONIA in the last 168 hours. Coagulation Profile: No results for input(s): INR, PROTIME in the last 168 hours. Cardiac Enzymes: Recent Labs  Lab 03/13/21 0741  CKTOTAL 166   BNP (last 3 results) No results for input(s): PROBNP in the last 8760 hours. HbA1C: No results for input(s): HGBA1C in the last 72 hours. CBG: Recent Labs  Lab 03/16/21 0745 03/16/21 1146 03/16/21 1604 03/16/21 2009 03/17/21 0859  GLUCAP 76 114* 136* 172* 117*   Lipid Profile: No results for input(s): CHOL, HDL, LDLCALC, TRIG, CHOLHDL, LDLDIRECT in the last 72 hours. Thyroid Function Tests: No results for input(s): TSH, T4TOTAL, FREET4, T3FREE, THYROIDAB in the last 72 hours. Anemia Panel: No results for input(s): VITAMINB12, FOLATE, FERRITIN, TIBC, IRON, RETICCTPCT in the last 72 hours.  Sepsis Labs: Recent Labs  Lab 03/13/21 0741 03/14/21 2248  LATICACIDVEN 1.6 1.2    Recent Results (from the past 240 hour(s))  Resp Panel by RT-PCR (Flu A&B, Covid) Nasopharyngeal Swab     Status: None   Collection Time: 03/12/21 11:26 PM   Specimen: Nasopharyngeal Swab; Nasopharyngeal(NP) swabs in vial transport medium  Result Value Ref Range Status   SARS Coronavirus 2 by RT  PCR NEGATIVE NEGATIVE Final    Comment: (NOTE) SARS-CoV-2 target nucleic acids are NOT DETECTED.  The SARS-CoV-2 RNA is generally detectable in upper respiratory specimens during the acute phase of infection. The lowest concentration of SARS-CoV-2 viral copies this assay can detect is 138 copies/mL. A negative result does not preclude SARS-Cov-2 infection and should not be used as the sole basis for treatment or other patient management decisions. A negative result may occur with  improper specimen collection/handling, submission of specimen other than nasopharyngeal swab, presence of viral mutation(s) within the areas targeted by this  assay, and inadequate number of viral copies(<138 copies/mL). A negative result must be combined with clinical observations, patient history, and epidemiological information. The expected result is Negative.  Fact Sheet for Patients:  EntrepreneurPulse.com.au  Fact Sheet for Healthcare Providers:  IncredibleEmployment.be  This test is no t yet approved or cleared by the [place] FDA and  has been authorized for detection and/or diagnosis of SARS-CoV-2 by FDA under an Emergency Use Authorization (EUA). This EUA will remain  in effect (meaning this test can be used) for the duration of the COVID-19 declaration under Section 564(b)(1) of the Act, 21 U.S.C.section 360bbb-3(b)(1), unless the authorization is terminated  or revoked sooner.       Influenza A by PCR NEGATIVE NEGATIVE Final   Influenza B by PCR NEGATIVE NEGATIVE Final    Comment: (NOTE) The Xpert Xpress SARS-CoV-2/FLU/RSV plus assay is intended as an aid in the diagnosis of influenza from Nasopharyngeal swab specimens and should not be used as a sole basis for treatment. Nasal washings and aspirates are unacceptable for Xpert Xpress SARS-CoV-2/FLU/RSV testing.  Fact Sheet for Patients: EntrepreneurPulse.com.au  Fact Sheet for  Healthcare Providers: IncredibleEmployment.be  This test is not yet approved or cleared by the [place] FDA and has been authorized for detection and/or diagnosis of SARS-CoV-2 by FDA under an Emergency Use Authorization (EUA). This EUA will remain in effect (meaning this test can be used) for the duration of the COVID-19 declaration under Section 564(b)(1) of the Act, 21 U.S.C. section 360bbb-3(b)(1), unless the authorization is terminated or revoked.  Performed at [facility], [address], [place], [place] [address]    Radiology Studies: No results found.  Scheduled Meds:  amLODipine  5 mg Oral Daily   aspirin EC  81 mg Oral Daily   atorvastatin  80 mg Oral QHS   Chlorhexidine Gluconate Cloth  6 each Topical Daily   clopidogrel  75 mg Oral Daily   DULoxetine  30 mg Oral Daily   ferrous sulfate  325 mg Oral Daily   gabapentin  100 mg Oral TID   insulin aspart  0-9 Units Subcutaneous TID WC   insulin glargine-yfgn  40 Units Subcutaneous QHS   mirtazapine  7.5 mg Oral QHS   pantoprazole  40 mg Oral BID    LOS: 4 days   Time spent: 30 min  Desma Maxim, MD [facility]  If 7PM-7AM, please contact night-coverage [email or web address]  03/17/2021, 12:13 PM

## 2021-03-17 NOTE — Progress Notes (Signed)
Entered room in response to bed alarm to find patient standing at the foot of the bed yelling "I NEED TO POOP!" Foley cath tubing wrapped around end of bed with max tension; patient placed safely on BSC with PT assist. Foley balloon deflated with approximately 74mL sterile water removed. Patient bleeding slightly from urethra. Patient had large bowel movement on [facility]; stated she was very sick. Staff RN entered the room and assisted patient back to bed after peri-care. Review MAR.

## 2021-03-18 DIAGNOSIS — N179 Acute kidney failure, unspecified: Secondary | ICD-10-CM | POA: Diagnosis not present

## 2021-03-18 LAB — BASIC METABOLIC PANEL
Anion gap: 9 (ref 5–15)
BUN: 23 mg/dL (ref 8–23)
CO2: 31 mmol/L (ref 22–32)
Calcium: 8.6 mg/dL — ABNORMAL LOW (ref 8.9–10.3)
Chloride: 103 mmol/L (ref 98–111)
Creatinine, Ser: 1.39 mg/dL — ABNORMAL HIGH (ref 0.44–1.00)
GFR, Estimated: 36 mL/min — ABNORMAL LOW (ref >60)
Glucose, Bld: 138 mg/dL — ABNORMAL HIGH (ref 70–99)
Potassium: 4.3 mmol/L (ref 3.5–5.1)
Sodium: 143 mmol/L (ref 135–145)

## 2021-03-18 LAB — RESP PANEL BY RT-PCR (FLU A&B, COVID) ARPGX2
Influenza A by PCR: NEGATIVE
Influenza B by PCR: NEGATIVE
SARS Coronavirus 2 by RT PCR: NEGATIVE

## 2021-03-18 LAB — GLUCOSE, CAPILLARY
Glucose-Capillary: 131 mg/dL — ABNORMAL HIGH (ref 70–99)
Glucose-Capillary: 149 mg/dL — ABNORMAL HIGH (ref 70–99)
Glucose-Capillary: 169 mg/dL — ABNORMAL HIGH (ref 70–99)
Glucose-Capillary: 139 mg/dL — ABNORMAL HIGH (ref 70–99)
Glucose-Capillary: 169 mg/dL — ABNORMAL HIGH (ref 70–99)

## 2021-03-18 NOTE — Progress Notes (Signed)
Physical Therapy Treatment Patient Details Name: Judy Harvey MRN: [id number] DOB: 07-05-1927 Today's Date: 03/18/2021   History of Present Illness Patient is a 88 year old female with history of CAD status post stenting, hypertension, diabetes mellitus type 2, legally blind, chronic anemia was brought to the ER after patient was finding increasingly weak and has had at least 3 falls in the last 1 week. Found to have ARF, hypotension, anemia.    PT Comments    Pt received in bed agreeable to PT. Did require significant hand over hand cuing and increased time with participation as pt reports he hearing aid batteries have died and she is very [place] and is legally blind at baseline.  Overall continues to have very short, shallow breaths, denies need of O2, currently on 1L/min. Removes on own to blow her nose. Probably has  doffed for ~ 2 min and clearly becomes SOB. PT assisted in donning O2 with improvement in SOB. HR and SPO2 WNL's at rest. Able to improve in initiating Ue's and LE's to EOB with use of bed rails and HOB elevated but does ultimately require modA At trunk to attain sitting EOB with hand over hand cues for UE placement for support. Able to scoot to EOB with increased time and effort with close supervision. By the time, equipment organized for further mobility, pt reporting dizziness and fatigue sitting EOB requesting to return to supine. SPO2 >90% and HR in mid 90's. Supervision to return to supine in bed. Is overall agreeable to LE therex in bed but declining another attempt in standing. All needs in reach post therex. Will benefit from STR upon d/c as pt remains unable to tolerate ambulation at this time due to fatigue, which is below pt baseline.    Recommendations for follow up therapy are one component of a multi-disciplinary discharge planning process, led by the attending physician.  Recommendations may be updated based on patient status, additional functional criteria and  insurance authorization.  Follow Up Recommendations  Skilled nursing-short term rehab (<3 hours/day)     Assistance Recommended at Discharge Frequent or constant Supervision/Assistance  Patient can return home with the following Two people to help with walking and/or transfers;A lot of help with bathing/dressing/bathroom;Assistance with feeding;Help with stairs or ramp for entrance   Equipment Recommendations  None recommended by PT    Recommendations for Other Services       Precautions / Restrictions Precautions Precautions: Fall Precaution Comments: legally blind Restrictions Weight Bearing Restrictions: No     Mobility  Bed Mobility   Bed Mobility: Supine to Sit;Sit to Supine     Supine to sit: Mod assist;HOB elevated Sit to supine: Min guard   General bed mobility comments: ABle to mobilize LE's off of bed, requires hand over hand cuing to bedrail to assist in sitting EOB. Does require modA at trunkt to attain sitting. Increased time and effort to scoot to EOb but able to perform without physical assist. Patient Response: Cooperative  Transfers Overall transfer level: Needs assistance Equipment used: Rolling walker (2 wheels)               General transfer comment: Denies standing today due to feelings of dizziness and fatigue. SPO2 maintained >90% however sitting EOB.    Ambulation/Gait                   Stairs             Wheelchair Mobility    Modified Rankin (Stroke  Patients Only)       Balance Overall balance assessment: Needs assistance Sitting-balance support: Feet supported;No upper extremity supported Sitting balance-Leahy Scale: Fair   Postural control: Right lateral lean (towards HOB for support)   Standing balance-Leahy Scale: Zero                              Cognition Arousal/Alertness: Awake/alert Behavior During Therapy: WFL for tasks assessed/performed Overall Cognitive Status: Within Functional  Limits for tasks assessed                                 General Comments: requires increased time and hand over hand cuing for mobility due to being legally blind and being very HOH with her hearing aids being dead.        Exercises General Exercises - Lower Extremity Ankle Circles/Pumps: AROM;Strengthening;Both;15 reps;Supine Hip ABduction/ADduction: AROM;Supine;Strengthening;Both;10 reps Straight Leg Raises: AROM;Supine;Strengthening;Both;5 reps Other Exercises Other Exercises: Diaphragmatic breathing with tactile cues on diaphragm, x5 reps. Max cuing in attempts to improve short, shallow breaths and oxygen exchange.    General Comments General comments (skin integrity, edema, etc.): Maintained on 1L/min with SPo2 >90% throughout. Toelrated supine therex but easily fatigues and displays SOB.      Pertinent Vitals/Pain Pain Assessment: No/denies pain    Home Living                          Prior Function            PT Goals (current goals can now be found in the care plan section) Acute Rehab PT Goals Patient Stated Goal: to return home PT Goal Formulation: With patient Time For Goal Achievement: 03/29/21 Potential to Achieve Goals: Fair Progress towards PT goals: Progressing toward goals    Frequency    Min 2X/week      PT Plan Current plan remains appropriate    Co-evaluation              AM-PAC PT "6 Clicks" Mobility   Outcome Measure  Help needed turning from your back to your side while in a flat bed without using bedrails?: A Lot Help needed moving from lying on your back to sitting on the side of a flat bed without using bedrails?: A Lot Help needed moving to and from a bed to a chair (including a wheelchair)?: A Lot Help needed standing up from a chair using your arms (e.g., wheelchair or bedside chair)?: A Lot Help needed to walk in hospital room?: Total Help needed climbing 3-5 steps with a railing? : Total 6 Click  Score: 10    End of Session Equipment Utilized During Treatment: Oxygen Activity Tolerance: Patient limited by fatigue Patient left: in bed;with call bell/phone within reach;with bed alarm set Nurse Communication: Mobility status PT Visit Diagnosis: Muscle weakness (generalized) (M62.81);Unsteadiness on feet (R26.81);Repeated falls (R29.6)     Time: [id number] PT Time Calculation (min) (ACUTE ONLY): 29 min  Charges:  $Therapeutic Exercise: 8-22 mins $Therapeutic Activity: 8-22 mins                    Jeiden Daughtridge M. Fairly IV, PT, DPT Physical Therapist- [facility]  03/18/2021, 11:09 AM

## 2021-03-18 NOTE — Progress Notes (Signed)
PROGRESS NOTE    Judy Harvey  JTT:[id number] DOB: 1927-05-31 DOA: 03/12/2021 PCP: Mick Sell, MD   Brief Narrative:  Judy Harvey is a 88 y.o. female with history of CAD status post stenting, hypertension, diabetes mellitus type 2, legally blind, chronic anemia was brought to the ER after patient was having increased weakness and has had at least 3 falls in the last 1 week.  Denies losing consciousness.  Denies any chest pain but has been some nausea and poor appetite.  In ED patient was noted to be hypotensive with acute on chronic anemia, hospitalist called for admission.   Assessment & Plan:  # Acute kidney injury Baseline creatinine ~1. Here 3.7 on admission improving with IVF this morning is 1.39. Son confirmed poor PO at home. - continue IVF - encourage PO  # Hyperkalemia 2/2 AKI. Resolved with lokelma - monitor off lokelma - IVF as above  # Acute urinary retention CT on admission w/o signs obstruction. Foley placed 1/3 - maintain foley - outpt urology f/u 2 wks  # Acute on chronic iron deficiency anemia No bleeding noted. Received 1 unit here. Labs showing iron deficiency. Hgb stable in the 8s. Fecal occult blood neg - monitor - consider outpt GI f/u  # CAD  # Hx CVA - cont asa, statin, plavix  # Carotid stenosis S/p stent - continue asa, plavix, statin  # Nausea/vomiting Now tolerating diet  # Acute hypoxic respiratory failure O2 wnl on 2 L breathing comfortably. No infiltrate or edema on admission cxr. O2 weaned to 1 L - continue to wean  # T2DM Glucose wnl - cont ssi, basal    DVT prophylaxis: lovenox Code Status: DNR, confirmed with son 03/14/2021 Family Communication: Updated over the phone 1/5  Status is: Inpatient  Dispo: The patient is from: Home              Anticipated d/c is to: SNF              Anticipated d/c date is: 48-72 hours              Patient currently not medically stable for discharge -unsafe disposition,  unsafe to discharge home given profound weakness ambulatory dysfunction, recommending SNF placement which son is currently agreeable to.  Consultants:  None  Procedures:  None  Antimicrobials:  None indicated  Subjective: No acute issues or events overnight, tolerating diet, no pain  Objective: Vitals:   03/17/21 2347 03/18/21 0349 03/18/21 0837 03/18/21 1209  BP: (!) 145/57 (!) 170/88 127/70 132/74  Pulse: 96 92 90 92  Resp: 20 16 18    Temp: 97.8 F (36.6 C) 98 F (36.7 C) 98.7 F (37.1 C) 98.5 F (36.9 C)  TempSrc: Oral Oral Oral Oral  SpO2: 95% 94% 91% 94%  Weight:      Height:        Intake/Output Summary (Last 24 hours) at 03/18/2021 1405 Last data filed at 03/18/2021 1210 Gross per 24 hour  Intake 1568.61 ml  Output 900 ml  Net 668.61 ml   Filed Weights   03/12/21 1840  Weight: 74.8 kg    Examination:  General:  Pleasantly confused resting in bed, No acute distress. HEENT:  Normocephalic atraumatic.  Sclerae nonicteric, noninjected. Neck:  Without mass or deformity.  Trachea is midline. Lungs:  Clear to auscultate bilaterally without rhonchi, wheeze, or rales. Heart:  Regular rate and rhythm.  Without murmurs, rubs, or gallops. Abdomen:  Soft, nontender, nondistended.  Without  guarding or rebound. Extremities: Without cyanosis, clubbing, edema, or obvious deformity. Vascular:  Dorsalis pedis and posterior tibial pulses palpable bilaterally. Skin:  Warm and dry, no erythema, no ulcerations.  Data Reviewed: I have personally reviewed following labs and imaging studies  CBC: Recent Labs  Lab 03/12/21 1900 03/12/21 2326 03/13/21 0741 03/14/21 0913 03/15/21 0717 03/16/21 0542 03/17/21 0532  WBC 11.8*   < > 11.5* 9.6 8.2 7.4 7.9  NEUTROABS 9.1*  --   --   --   --   --   --   HGB 7.9*   < > 8.2* 9.1* 7.9* 8.0* 8.3*  HCT 26.8*   < > 27.0* 30.2* 26.6* 27.2* 28.4*  MCV 87.0   < > 84.6 85.8 86.9 86.6 86.9  PLT 446*   < > 315 311 274 254 241   < > =  values in this interval not displayed.   Basic Metabolic Panel: Recent Labs  Lab 03/14/21 0913 03/15/21 0717 03/16/21 0542 03/17/21 0532 03/17/21 1547 03/18/21 0829  NA 137 137 141 142  --  143  K 4.7 5.3* 5.3* 5.4* 5.2* 4.3  CL 101 103 104 103  --  103  CO2 28 30 32 31  --  31  GLUCOSE 134* 143* 72 119*  --  138*  BUN 42* 40* 36* 31*  --  23  CREATININE 2.94* 2.47* 1.98* 1.63*  --  1.39*  CALCIUM 8.8* 8.4* 8.8* 8.7*  --  8.6*   GFR: Estimated Creatinine Clearance: 26.1 mL/min (A) (by C-G formula based on SCr of 1.39 mg/dL (H)). Liver Function Tests: Recent Labs  Lab 03/12/21 1900  AST 22  ALT 13  ALKPHOS 83  BILITOT 0.7  PROT 7.0  ALBUMIN 3.2*   No results for input(s): LIPASE, AMYLASE in the last 168 hours. No results for input(s): AMMONIA in the last 168 hours. Coagulation Profile: No results for input(s): INR, PROTIME in the last 168 hours. Cardiac Enzymes: Recent Labs  Lab 03/13/21 0741  CKTOTAL 166   BNP (last 3 results) No results for input(s): PROBNP in the last 8760 hours. HbA1C: No results for input(s): HGBA1C in the last 72 hours. CBG: Recent Labs  Lab 03/17/21 1621 03/17/21 1954 03/18/21 0212 03/18/21 0838 03/18/21 1130  GLUCAP 153* 153* 131* 149* 169*   Lipid Profile: No results for input(s): CHOL, HDL, LDLCALC, TRIG, CHOLHDL, LDLDIRECT in the last 72 hours. Thyroid Function Tests: No results for input(s): TSH, T4TOTAL, FREET4, T3FREE, THYROIDAB in the last 72 hours. Anemia Panel: No results for input(s): VITAMINB12, FOLATE, FERRITIN, TIBC, IRON, RETICCTPCT in the last 72 hours.  Sepsis Labs: Recent Labs  Lab 03/13/21 0741 03/14/21 2248  LATICACIDVEN 1.6 1.2    Recent Results (from the past 240 hour(s))  Resp Panel by RT-PCR (Flu A&B, Covid) Nasopharyngeal Swab     Status: None   Collection Time: 03/12/21 11:26 PM   Specimen: Nasopharyngeal Swab; Nasopharyngeal(NP) swabs in vial transport medium  Result Value Ref Range Status    SARS Coronavirus 2 by RT PCR NEGATIVE NEGATIVE Final    Comment: (NOTE) SARS-CoV-2 target nucleic acids are NOT DETECTED.  The SARS-CoV-2 RNA is generally detectable in upper respiratory specimens during the acute phase of infection. The lowest concentration of SARS-CoV-2 viral copies this assay can detect is 138 copies/mL. A negative result does not preclude SARS-Cov-2 infection and should not be used as the sole basis for treatment or other patient management decisions. A negative result may occur with  improper specimen collection/handling, submission of specimen other than nasopharyngeal swab, presence of viral mutation(s) within the areas targeted by this assay, and inadequate number of viral copies(<138 copies/mL). A negative result must be combined with clinical observations, patient history, and epidemiological information. The expected result is Negative.  Fact Sheet for Patients:  BloggerCourse.com  Fact Sheet for Healthcare Providers:  SeriousBroker.it  This test is no t yet approved or cleared by the [place] FDA and  has been authorized for detection and/or diagnosis of SARS-CoV-2 by FDA under an Emergency Use Authorization (EUA). This EUA will remain  in effect (meaning this test can be used) for the duration of the COVID-19 declaration under Section 564(b)(1) of the Act, 21 U.S.C.section 360bbb-3(b)(1), unless the authorization is terminated  or revoked sooner.       Influenza A by PCR NEGATIVE NEGATIVE Final   Influenza B by PCR NEGATIVE NEGATIVE Final    Comment: (NOTE) The Xpert Xpress SARS-CoV-2/FLU/RSV plus assay is intended as an aid in the diagnosis of influenza from Nasopharyngeal swab specimens and should not be used as a sole basis for treatment. Nasal washings and aspirates are unacceptable for Xpert Xpress SARS-CoV-2/FLU/RSV testing.  Fact Sheet for  Patients: BloggerCourse.com  Fact Sheet for Healthcare Providers: SeriousBroker.it  This test is not yet approved or cleared by the [place] FDA and has been authorized for detection and/or diagnosis of SARS-CoV-2 by FDA under an Emergency Use Authorization (EUA). This EUA will remain in effect (meaning this test can be used) for the duration of the COVID-19 declaration under Section 564(b)(1) of the Act, 21 U.S.C. section 360bbb-3(b)(1), unless the authorization is terminated or revoked.  Performed at [facility], [address] [place], [place] [address]    Radiology Studies: No results found.  Scheduled Meds:  amLODipine  5 mg Oral Daily   aspirin EC  81 mg Oral Daily   atorvastatin  80 mg Oral QHS   Chlorhexidine Gluconate Cloth  6 each Topical Daily   clopidogrel  75 mg Oral Daily   DULoxetine  30 mg Oral Daily   enoxaparin (LOVENOX) injection  30 mg Subcutaneous Q24H   ferrous sulfate  325 mg Oral Daily   gabapentin  100 mg Oral TID   insulin aspart  0-9 Units Subcutaneous TID WC   insulin glargine-yfgn  40 Units Subcutaneous QHS   mirtazapine  7.5 mg Oral QHS   pantoprazole  40 mg Oral BID    LOS: 5 days   Time spent: 30 min  Silvano Bilis, MD [facility]  If 7PM-7AM, please contact night-coverage [email or web address]  03/18/2021, 2:05 PM

## 2021-03-18 NOTE — TOC Progression Note (Signed)
Transition of Care [facility]) - Progression Note    Patient Details  Name: GIOVANNA KEMMERER MRN: [id number] Date of Birth: 1927-07-27  [facility] CM/SW Contact  Gildardo Griffes, [place] Phone Number: 03/18/2021, 2:10 PM  Clinical Narrative:     Patient has bed at [facility], insurance auth started today and Tammy at [facility] updated that plan is for patient to discharge tomorrow.  Expected Discharge Plan: Skilled Nursing Facility Barriers to Discharge: Continued Medical Work up  Expected Discharge Plan and Services Expected Discharge Plan: Skilled Nursing Facility In-house Referral: NA   Post Acute Care Choice: Skilled Nursing Facility Living arrangements for the past 2 months: Single Family Home                                       Social Determinants of Health (SDOH) Interventions    Readmission Risk Interventions No flowsheet data found.

## 2021-03-18 NOTE — Progress Notes (Signed)
Mobility Specialist - Progress Note   03/18/21 1500  Mobility  Activity Transferred to/from [facility]  Level of Assistance Minimal assist, patient does 75% or more  Assistive Device BSC  Distance Ambulated (ft) 1 ft  Mobility Out of bed for toileting  Mobility Response Tolerated well  Mobility performed by Mobility specialist  $Mobility charge 1 Mobility    Pt lying in bed upon arrival, utilizing 1L. Pt voiced need to have BM. Pt able to sit EOB with modA and hip shift forward with supervision. Pt stood and SPT to [facility] with minA---VC for hand placement and guidance d/t vision impairments. Mild dizziness. Pt returned supine with LE support. Pt repositioned to HOB +1. Pt left in bed with alarm set.    Filiberto Pinks Mobility Specialist 03/18/21, 3:25 PM

## 2021-03-18 NOTE — Plan of Care (Signed)
°  Problem: Clinical Measurements: Goal: Will remain free from infection Outcome: Progressing   Problem: Clinical Measurements: Goal: Respiratory complications will improve Outcome: Progressing   Problem: Clinical Measurements: Goal: Cardiovascular complication will be avoided Outcome: Progressing   Problem: Elimination: Goal: Will not experience complications related to bowel motility Outcome: Progressing   Problem: Elimination: Goal: Will not experience complications related to urinary retention Outcome: Progressing   Problem: Safety: Goal: Ability to remain free from injury will improve Outcome: Progressing

## 2021-03-19 LAB — BASIC METABOLIC PANEL
Anion gap: 3 — ABNORMAL LOW (ref 5–15)
BUN: 24 mg/dL — ABNORMAL HIGH (ref 8–23)
CO2: 32 mmol/L (ref 22–32)
Calcium: 8.3 mg/dL — ABNORMAL LOW (ref 8.9–10.3)
Chloride: 103 mmol/L (ref 98–111)
Creatinine, Ser: 1.28 mg/dL — ABNORMAL HIGH (ref 0.44–1.00)
GFR, Estimated: 40 mL/min — ABNORMAL LOW (ref >60)
Glucose, Bld: 171 mg/dL — ABNORMAL HIGH (ref 70–99)
Potassium: 3.9 mmol/L (ref 3.5–5.1)
Sodium: 138 mmol/L (ref 135–145)

## 2021-03-19 LAB — GLUCOSE, CAPILLARY
Glucose-Capillary: 128 mg/dL — ABNORMAL HIGH (ref 70–99)
Glucose-Capillary: 182 mg/dL — ABNORMAL HIGH (ref 70–99)
Glucose-Capillary: 142 mg/dL — ABNORMAL HIGH (ref 70–99)

## 2021-03-19 LAB — CBC
HCT: 24.8 % — ABNORMAL LOW (ref 36.0–46.0)
Hemoglobin: 7.5 g/dL — ABNORMAL LOW (ref 12.0–15.0)
MCH: 26 pg (ref 26.0–34.0)
MCHC: 30.2 g/dL (ref 30.0–36.0)
MCV: 85.8 fL (ref 80.0–100.0)
Platelets: 229 10*3/uL (ref 150–400)
RBC: 2.89 MIL/uL — ABNORMAL LOW (ref 3.87–5.11)
RDW: 15.5 % (ref 11.5–15.5)
WBC: 8.4 10*3/uL (ref 4.0–10.5)
nRBC: 0 % (ref 0.0–0.2)

## 2021-03-19 MED ORDER — INSULIN GLARGINE 100 UNIT/ML ~~LOC~~ SOLN: 40.0000 [IU] | Freq: Every day | SUBCUTANEOUS | 5 refills | Status: DC

## 2021-03-19 MED ORDER — GABAPENTIN 100 MG PO CAPS: 600.0000 mg | ORAL_CAPSULE | Freq: Three times a day (TID) | ORAL | Status: DC

## 2021-03-19 NOTE — Progress Notes (Signed)
Called [facility] to given report, [phone] gave report to [employer] LPN .

## 2021-03-19 NOTE — Discharge Summary (Signed)
Judy Harvey EAV:[id number] DOB: 1927/11/16 DOA: 03/12/2021  PCP: Mick Sell, MD  Admit date: 03/12/2021 Discharge date: 03/19/2021  Time spent: 40 minutes  Recommendations for Outpatient Follow-up:  Bmp and cbc in 1 week Urology f/u 2 weeks for urinary retention Hematology f/u for anemia    Discharge Diagnoses:  Principal Problem:   ARF (acute renal failure) (HCC) Active Problems:   Type 2 diabetes mellitus without complication, with long-term current use of insulin (HCC)   Legally blind   Normocytic anemia   Discharge Condition: stable  Diet recommendation: heart healthy  Filed Weights   03/12/21 1840  Weight: 74.8 kg    History of present illness:  Judy Harvey is a 88 y.o. female with history of CAD status post stenting, hypertension, diabetes mellitus type 2, legally blind, chronic anemia was brought to the ER after patient was finding increasingly weak and has had at least 3 falls in the last 1 week.  Denies losing consciousness.  Denies any chest pain but has been some nausea and poor appetite. ED Course: In the ER patient was hypotensive responding to IV fluids.  Hemoglobin is 7.9 which is dropped from 10.4 about few months ago.  Creatinine is markedly increased from 0.9 in August 2022 it is around 3.7.  COVID test are negative.  Patient admitted for acute renal failure with worsening anemia and generalized weakness.  Hospital Course:  # Acute kidney injury Baseline creatinine ~1. Here 3.7 on admission improving with IVF this morning is 1.28. Son confirmed poor PO at home thus that's the likely cause - repeat bmp in 1 week - encourage PO   # Hyperkalemia 2/2 AKI. Resolved with lokelma - bmp 1 week   # Acute urinary retention CT on admission w/o signs obstruction. Foley placed 1/3 - maintain foley - outpt urology f/u 2 wks, referral placed   # Acute on chronic iron deficiency anemia No bleeding noted. Received 1 unit here. Labs showing iron  deficiency. Hgb stable ~8. Fecal occult blood neg - monitor - po iron - consider GI vs hematology f/u - cbc 1 week  # Acute hypoxic respiratory failure O2 wnl on 2 L breathing comfortably. No infiltrate or edema on admission cxr. O2 weaned to 1 L - continue to wean   # CAD  # Hx CVA - cont asa, statin, plavix   # Carotid stenosis S/p stent - continue asa, plavix, statin   # Nausea/vomiting Now tolerating diet  # T2DM Glucose wnl - cont ssi, basal insulin    Procedures: none   Consultations: none  Discharge Exam: Vitals:   03/19/21 0802 03/19/21 0949  BP: (!) 129/58 (!) 149/76  Pulse: 77 89  Resp: 20   Temp: 98.4 F (36.9 C)   SpO2: 96%     General:  Pleasantly confused resting in bed, No acute distress. HEENT:  Normocephalic atraumatic.  Sclerae nonicteric, noninjected. Neck:  Without mass or deformity.  Trachea is midline. Lungs:  Clear to auscultate bilaterally without rhonchi, wheeze, or rales. Heart:  Regular rate and rhythm.  Without murmurs, rubs, or gallops. Abdomen:  Soft, nontender, nondistended.  Without guarding or rebound. Extremities: Without cyanosis, clubbing, edema, or obvious deformity. Vascular:  Dorsalis pedis and posterior tibial pulses palpable bilaterally. Skin:  Warm and dry, no erythema, no ulcerations.  Discharge Instructions   Discharge Instructions     Ambulatory referral to Hematology / Oncology   Complete by: As directed    Ambulatory referral to Urology  Complete by: As directed    Diet - low sodium heart healthy   Complete by: As directed    Increase activity slowly   Complete by: As directed       Allergies as of 03/19/2021       Reactions   Adhesive [tape] Other (See Comments)   "pulls my skin off" paper tape ok   Lyrica [pregabalin]    Body spasms        Medication List     STOP taking these medications    lisinopril 40 MG tablet Commonly known as: ZESTRIL   metFORMIN 500 MG 24 hr tablet Commonly  known as: GLUCOPHAGE-XR       TAKE these medications    acetaminophen 325 MG tablet Commonly known as: TYLENOL Take 650 mg by mouth every 6 (six) hours as needed for mild pain or fever.   albuterol 108 (90 Base) MCG/ACT inhaler Commonly known as: VENTOLIN HFA Inhale 2 puffs into the lungs every 6 (six) hours as needed for shortness of breath or wheezing.   albuterol 1.25 MG/3ML nebulizer solution Commonly known as: ACCUNEB Take 3 mLs by nebulization every 6 (six) hours as needed for wheezing or shortness of breath.   amLODipine 5 MG tablet Commonly known as: NORVASC Take 5 mg by mouth daily.   aspirin EC 81 MG tablet Take 81 mg by mouth daily.   atorvastatin 80 MG tablet Commonly known as: LIPITOR TAKE 1 TABLET BY MOUTH EVERYDAY AT BEDTIME What changed:  how much to take how to take this when to take this additional instructions   clobetasol ointment 0.05 % Commonly known as: TEMOVATE Apply 1 application topically 2 (two) times daily.   clopidogrel 75 MG tablet Commonly known as: PLAVIX Take 1 tablet (75 mg total) by mouth daily.   DULoxetine 30 MG capsule Commonly known as: CYMBALTA TAKE 1 CAPSULE BY MOUTH EVERY DAY What changed:  how much to take how to take this when to take this additional instructions   ferrous sulfate 325 (65 FE) MG tablet Take 325 mg by mouth daily.   gabapentin 100 MG capsule Commonly known as: NEURONTIN Take 6 capsules (600 mg total) by mouth 3 (three) times daily. What changed: medication strength   insulin glargine 100 UNIT/ML injection Commonly known as: Lantus Inject 0.4 mLs (40 Units total) into the skin daily. INJECT 50 UNITS INTO THE SKIN DAILY AT 10 PM. What changed: how much to take   Insulin Syringe-Needle U-100 31G X 5/16" 0.3 ML Misc Commonly known as: B-D INS SYR ULTRAFINE .3CC/31G Use to administer insulin daily to control diabetes. 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    mirtazapine 7.5 MG tablet Commonly known as: REMERON Take 7.5 mg by mouth at bedtime.   nystatin powder Commonly known as: MYCOSTATIN/NYSTOP Apply topically 2 (two) times daily.   ondansetron 8 MG tablet Commonly known as: ZOFRAN Take 8 mg by mouth every 8 (eight) hours as needed for nausea or vomiting.   pantoprazole 40 MG tablet Commonly known as: PROTONIX Take 40 mg by mouth 2 (two) times daily.   Vitamin D3 125 MCG (5000 UT) Caps Take 1 capsule (5,000 Units total) by mouth daily.       Allergies  Allergen Reactions   Adhesive [Tape] Other (See Comments)    "pulls my skin off" paper tape ok   Lyrica [Pregabalin]     Body spasms    Follow-up Information     Clydie Braun  P, MD Follow up.   Specialty: Infectious Diseases Contact information: [address] [place] [address] [phone]                  The results of significant diagnostics from this hospitalization (including imaging, microbiology, ancillary and laboratory) are listed below for reference.    Significant Diagnostic Studies: CT ABDOMEN PELVIS WO CONTRAST  Result Date: 03/13/2021 CLINICAL DATA:  Nausea, vomiting, low back pain, weakness EXAM: CT ABDOMEN AND PELVIS WITHOUT CONTRAST TECHNIQUE: Multidetector CT imaging of the abdomen and pelvis was performed following the standard protocol without IV contrast. COMPARISON:  None. FINDINGS: Lower chest: Mild bibasilar atelectasis. Extensive multi-vessel coronary artery calcification. Hypoattenuation of the cardiac blood pool is in keeping with moderate anemia. Moderate hiatal hernia. Hepatobiliary: No focal liver abnormality is seen. No gallstones, gallbladder wall thickening, or biliary dilatation. Pancreas: Unremarkable Spleen: Unremarkable Adrenals/Urinary Tract: The adrenal glands are unremarkable. The kidneys are normal in size and position. 822 mm hyperdense cyst is seen within the upper pole of the right kidney, stable since  prior examination. Simple cortical cyst noted arising exophytically from the interpolar region of the right kidney. The kidneys are otherwise unremarkable. Bladder unremarkable. Stomach/Bowel: Stomach is within normal limits. Appendix is absent. Mild sigmoid diverticulosis. No evidence of bowel wall thickening, distention, or inflammatory changes. Vascular/Lymphatic: Moderate aortoiliac atherosclerotic calcification. Previously noted ulcer-like projection within the infrarenal abdominal aorta is not well assessed on this noncontrast examination. No abdominal aortic aneurysm. No pathologic adenopathy within the abdomen and pelvis. Reproductive: Uterus and bilateral adnexa are unremarkable. Other: No abdominal wall hernia.  No free intraperitoneal fluid. Musculoskeletal: Remote T12 superior endplate fracture with 40-50% loss of height is unchanged. Degenerative changes are noted within the lumbar spine. No acute bone abnormality. IMPRESSION: No acute intra-abdominal pathology identified. No definite radiographic explanation for the patient's reported symptoms. Distal colonic diverticulosis without superimposed acute inflammatory change. Extensive coronary artery calcification. Moderate hiatal hernia. Aortic Atherosclerosis (ICD10-I70.0). Electronically Signed   By: Helyn NumbersAshesh  Parikh M.D.   On: 03/13/2021 03:00   DG Chest 1 View  Result Date: 03/12/2021 CLINICAL DATA:  Weakness, lower back pain EXAM: CHEST  1 VIEW COMPARISON:  10/25/2020 FINDINGS: Single frontal view of the chest demonstrates an unremarkable cardiac silhouette. No acute airspace disease. Minimal blunting of the left costophrenic angle may reflect a small left pleural effusion. No pneumothorax. No acute bony abnormalities. Chronic right rib fractures. IMPRESSION: 1. Trace left pleural effusion versus pleural thickening. 2. Otherwise unremarkable exam. Electronically Signed   By: Sharlet SalinaMichael  Brown M.D.   On: 03/12/2021 19:06   US Abdomen Limited RUQ  (LIVER/GB)  Result Date: 03/13/2021 CLINICAL DATA:  Nausea. EXAM: ULTRASOUND ABDOMEN LIMITED RIGHT UPPER QUADRANT COMPARISON:  CT AP 03/13/2021 FINDINGS: Gallbladder: No gallstones or wall thickening visualized. No sonographic Murphy sign noted by sonographer. Common bile duct: Diameter: 3.8 mm Liver: No focal lesion identified. Within normal limits in parenchymal echogenicity. Portal vein is patent on color Doppler imaging with normal direction of blood flow towards the liver. Other: None. IMPRESSION: Normal exam. Electronically Signed   By: Signa Kellaylor  Stroud M.D.   On: 03/13/2021 06:02    Microbiology: Recent Results (from the past 240 hour(s))  Resp Panel by RT-PCR (Flu A&B, Covid) Nasopharyngeal Swab     Status: None   Collection Time: 03/12/21 11:26 PM   Specimen: Nasopharyngeal Swab; Nasopharyngeal(NP) swabs in vial transport medium  Result Value Ref Range Status   SARS Coronavirus 2 by RT PCR NEGATIVE  NEGATIVE Final    Comment: (NOTE) SARS-CoV-2 target nucleic acids are NOT DETECTED.  The SARS-CoV-2 RNA is generally detectable in upper respiratory specimens during the acute phase of infection. The lowest concentration of SARS-CoV-2 viral copies this assay can detect is 138 copies/mL. A negative result does not preclude SARS-Cov-2 infection and should not be used as the sole basis for treatment or other patient management decisions. A negative result may occur with  improper specimen collection/handling, submission of specimen other than nasopharyngeal swab, presence of viral mutation(s) within the areas targeted by this assay, and inadequate number of viral copies(<138 copies/mL). A negative result must be combined with clinical observations, patient history, and epidemiological information. The expected result is Negative.  Fact Sheet for Patients:  BloggerCourse.comhttps://www.fda.gov/media/152166/download  Fact Sheet for Healthcare Providers:  SeriousBroker.ithttps://www.fda.gov/media/152162/download  This  test is no t yet approved or cleared by the [place] FDA and  has been authorized for detection and/or diagnosis of SARS-CoV-2 by FDA under an Emergency Use Authorization (EUA). This EUA will remain  in effect (meaning this test can be used) for the duration of the COVID-19 declaration under Section 564(b)(1) of the Act, 21 U.S.C.section 360bbb-3(b)(1), unless the authorization is terminated  or revoked sooner.       Influenza A by PCR NEGATIVE NEGATIVE Final   Influenza B by PCR NEGATIVE NEGATIVE Final    Comment: (NOTE) The Xpert Xpress SARS-CoV-2/FLU/RSV plus assay is intended as an aid in the diagnosis of influenza from Nasopharyngeal swab specimens and should not be used as a sole basis for treatment. Nasal washings and aspirates are unacceptable for Xpert Xpress SARS-CoV-2/FLU/RSV testing.  Fact Sheet for Patients: BloggerCourse.comhttps://www.fda.gov/media/152166/download  Fact Sheet for Healthcare Providers: SeriousBroker.ithttps://www.fda.gov/media/152162/download  This test is not yet approved or cleared by the [place] FDA and has been authorized for detection and/or diagnosis of SARS-CoV-2 by FDA under an Emergency Use Authorization (EUA). This EUA will remain in effect (meaning this test can be used) for the duration of the COVID-19 declaration under Section 564(b)(1) of the Act, 21 U.S.C. section 360bbb-3(b)(1), unless the authorization is terminated or revoked.  Performed at [facility], [address] [place], [place] [address]   Resp Panel by RT-PCR (Flu A&B, Covid) Nasopharyngeal Swab     Status: None   Collection Time: 03/18/21  2:30 PM   Specimen: Nasopharyngeal Swab; Nasopharyngeal(NP) swabs in vial transport medium  Result Value Ref Range Status   SARS Coronavirus 2 by RT PCR NEGATIVE NEGATIVE Final    Comment: (NOTE) SARS-CoV-2 target nucleic acids are NOT DETECTED.  The SARS-CoV-2 RNA is generally detectable in upper respiratory specimens during the acute  phase of infection. The lowest concentration of SARS-CoV-2 viral copies this assay can detect is 138 copies/mL. A negative result does not preclude SARS-Cov-2 infection and should not be used as the sole basis for treatment or other patient management decisions. A negative result may occur with  improper specimen collection/handling, submission of specimen other than nasopharyngeal swab, presence of viral mutation(s) within the areas targeted by this assay, and inadequate number of viral copies(<138 copies/mL). A negative result must be combined with clinical observations, patient history, and epidemiological information. The expected result is Negative.  Fact Sheet for Patients:  BloggerCourse.comhttps://www.fda.gov/media/152166/download  Fact Sheet for Healthcare Providers:  SeriousBroker.ithttps://www.fda.gov/media/152162/download  This test is no t yet approved or cleared by the [place] FDA and  has been authorized for detection and/or diagnosis of SARS-CoV-2 by FDA under an Emergency Use Authorization (EUA). This EUA  will remain  in effect (meaning this test can be used) for the duration of the COVID-19 declaration under Section 564(b)(1) of the Act, 21 U.S.C.section 360bbb-3(b)(1), unless the authorization is terminated  or revoked sooner.       Influenza A by PCR NEGATIVE NEGATIVE Final   Influenza B by PCR NEGATIVE NEGATIVE Final    Comment: (NOTE) The Xpert Xpress SARS-CoV-2/FLU/RSV plus assay is intended as an aid in the diagnosis of influenza from Nasopharyngeal swab specimens and should not be used as a sole basis for treatment. Nasal washings and aspirates are unacceptable for Xpert Xpress SARS-CoV-2/FLU/RSV testing.  Fact Sheet for Patients: BloggerCourse.com  Fact Sheet for Healthcare Providers: SeriousBroker.it  This test is not yet approved or cleared by the [place] FDA and has been authorized for detection and/or diagnosis of  SARS-CoV-2 by FDA under an Emergency Use Authorization (EUA). This EUA will remain in effect (meaning this test can be used) for the duration of the COVID-19 declaration under Section 564(b)(1) of the Act, 21 U.S.C. section 360bbb-3(b)(1), unless the authorization is terminated or revoked.  Performed at [facility], [address] [place], [place] [address]      Labs: Basic Metabolic Panel: Recent Labs  Lab 03/15/21 [phone] 03/16/21 0542 03/17/21 0532 03/17/21 1547 03/18/21 0829 03/19/21 0505  NA 137 141 142  --  143 138  K 5.3* 5.3* 5.4* 5.2* 4.3 3.9  CL 103 104 103  --  103 103  CO2 30 32 31  --  31 32  GLUCOSE 143* 72 119*  --  138* 171*  BUN 40* 36* 31*  --  23 24*  CREATININE 2.47* 1.98* 1.63*  --  1.39* 1.28*  CALCIUM 8.4* 8.8* 8.7*  --  8.6* 8.3*   Liver Function Tests: Recent Labs  Lab 03/12/21 1900  AST 22  ALT 13  ALKPHOS 83  BILITOT 0.7  PROT 7.0  ALBUMIN 3.2*   No results for input(s): LIPASE, AMYLASE in the last 168 hours. No results for input(s): AMMONIA in the last 168 hours. CBC: Recent Labs  Lab 03/12/21 1900 03/12/21 2326 03/14/21 0913 03/15/21 0717 03/16/21 0542 03/17/21 0532 03/19/21 0505  WBC 11.8*   < > 9.6 8.2 7.4 7.9 8.4  NEUTROABS 9.1*  --   --   --   --   --   --   HGB 7.9*   < > 9.1* 7.9* 8.0* 8.3* 7.5*  HCT 26.8*   < > 30.2* 26.6* 27.2* 28.4* 24.8*  MCV 87.0   < > 85.8 86.9 86.6 86.9 85.8  PLT 446*   < > 311 274 254 241 229   < > = values in this interval not displayed.   Cardiac Enzymes: Recent Labs  Lab 03/13/21 0741  CKTOTAL 166   BNP: BNP (last 3 results) No results for input(s): BNP in the last 8760 hours.  ProBNP (last 3 results) No results for input(s): PROBNP in the last 8760 hours.  CBG: Recent Labs  Lab 03/18/21 0838 03/18/21 1130 03/18/21 1624 03/18/21 2011 03/19/21 0733  GLUCAP 149* 169* 139* 169* 128*       Signed:  Silvano Bilis MD.  [facility] 03/19/2021, 1:21 PM

## 2021-03-19 NOTE — TOC Progression Note (Addendum)
Transition of Care [facility]) - Progression Note    Patient Details  Name: Judy Harvey MRN: [id number] Date of Birth: November 20, 1927  Transition [facility]) CM/SW Contact  Caryn Section, RN Phone Number: 03/19/2021, 11:38 AM  Clinical Narrative:   Vesta Mixer: [id number]; [id number]  03/19/2021-03/23/2021  As per Inetta Fermo at [facility], patient can transfer to facility , room 610today via EMS.  Care team and family aware.   Expected Discharge Plan: Skilled Nursing Facility Barriers to Discharge: Continued Medical Work up  Expected Discharge Plan and Services Expected Discharge Plan: Skilled Nursing Facility In-house Referral: NA   Post Acute Care Choice: Skilled Nursing Facility Living arrangements for the past 2 months: Single Family Home                                       Social Determinants of Health (SDOH) Interventions    Readmission Risk Interventions No flowsheet data found.

## 2021-03-19 NOTE — Care Management Important Message (Signed)
Important Message  Patient Details  Name: Judy Harvey MRN: [id number] Date of Birth: 12/20/1927   Medicare Important Message Given:  Yes  I reviewed the Important Message from Medicare with the patient's son, Talibah Colasurdo by phone [phone]) and he is agreement with the discharge plan and no copy needed. I thanked him for his time.    Olegario Messier A Kinston Magnan 03/19/2021, 10:29 AM

## 2021-03-24 ENCOUNTER — Telehealth: Payer: Self-pay | Admitting: Oncology

## 2021-03-24 NOTE — Telephone Encounter (Signed)
Nursing home called to set up a follow up hospital appt. Call back at [phone]

## 2021-03-24 NOTE — Telephone Encounter (Signed)
Pt already scheduled via [facility] through referral. Called [facility] to confirm.They are aware of the appt.

## 2021-03-26 ENCOUNTER — Encounter: Payer: Self-pay | Admitting: *Deleted

## 2021-03-30 ENCOUNTER — Other Ambulatory Visit: Payer: Self-pay

## 2021-03-30 ENCOUNTER — Inpatient Hospital Stay: Payer: Medicare Other | Attending: Oncology | Admitting: Oncology

## 2021-03-30 ENCOUNTER — Encounter: Payer: Self-pay | Admitting: Oncology

## 2021-03-30 ENCOUNTER — Inpatient Hospital Stay: Payer: Medicare Other

## 2021-03-30 VITALS — BP 109/67 | HR 83 | Temp 97.5°F | Wt 161.0 lb

## 2021-03-30 DIAGNOSIS — Z87891 Personal history of nicotine dependence: Secondary | ICD-10-CM | POA: Insufficient documentation

## 2021-03-30 DIAGNOSIS — E119 Type 2 diabetes mellitus without complications: Secondary | ICD-10-CM | POA: Diagnosis not present

## 2021-03-30 DIAGNOSIS — D509 Iron deficiency anemia, unspecified: Secondary | ICD-10-CM | POA: Insufficient documentation

## 2021-03-30 DIAGNOSIS — Z79899 Other long term (current) drug therapy: Secondary | ICD-10-CM | POA: Insufficient documentation

## 2021-03-30 DIAGNOSIS — I10 Essential (primary) hypertension: Secondary | ICD-10-CM | POA: Diagnosis not present

## 2021-03-30 DIAGNOSIS — D631 Anemia in chronic kidney disease: Secondary | ICD-10-CM | POA: Diagnosis not present

## 2021-03-30 DIAGNOSIS — Z7982 Long term (current) use of aspirin: Secondary | ICD-10-CM | POA: Insufficient documentation

## 2021-03-30 DIAGNOSIS — N1832 Chronic kidney disease, stage 3b: Secondary | ICD-10-CM | POA: Diagnosis not present

## 2021-03-30 NOTE — Progress Notes (Signed)
Hematology/Oncology Consult note [phone] [phone]   Patient Care Team: Judy Ramsay, MD as PCP - General (Infectious Diseases) Earlie Server, MD as Consulting Physician (Hematology and Oncology)  REFERRING PROVIDER: Gwynne Edinger, MD CHIEF COMPLAINTS/REASON FOR VISIT:  Evaluation of iron deficiency anemia  HISTORY OF PRESENTING ILLNESS:  Judy Harvey is a  88 y.o.  female with PMH listed below was seen in consultation at the request of Judy Harvey, Judy Rud, MD   for evaluation of iron deficiency anemia.   Reviewed patient's recent labs patient was hospitalized from 03/12/2021 - 03/19/2021, due to nausea vomiting, AKI, acute urinary retention, symptomatic anemia and acute hypoxic respiratory failure. 03/19/2021 Labs revealed anemia with hemoglobin of 7.5, MCV 85.8. Patient received 1 unit of PRBC during her most recent hospitalization  Patient has acute on chronic renal insufficiency, creatinine was increased to 3.7 which responded to IV fluid and decreased to 1.28 at discharge.  This was felt to be secondary to poor oral intake. 03/12/2021, saturation 4, ferritin 6, TIBC 361, iron 14. Reviewed patient's previous labs ordered by primary care physician's office, anemia is chronic onset , duration is since at 2019. Patient is a poor historian.  She is blind.  She does not know if she has any blood in the stool.  She was accompanied by her son Judy Harvey.  Per son, patient does not have good appetite.  Patient reports that she has taken oral iron supplementation Lately. She has an appointment with urology this week.    Review of Systems  Constitutional:  Positive for appetite change and fatigue. Negative for chills and fever.  HENT:   Negative for hearing loss and voice change.   Eyes:  Negative for eye problems.  Respiratory:  Positive for shortness of breath. Negative for chest tightness and cough.        Patient is on nasal cannula oxygen  Cardiovascular:   Negative for chest pain.  Gastrointestinal:  Negative for abdominal distention, abdominal pain and nausea.  Endocrine: Negative for hot flashes.  Genitourinary:  Negative for difficulty urinating and frequency.        Patient has a Foley  Musculoskeletal:  Negative for arthralgias.  Skin:  Negative for itching and rash.  Neurological:  Negative for extremity weakness.  Hematological:  Negative for adenopathy.  Psychiatric/Behavioral:  Negative for confusion.    MEDICAL HISTORY:  Past Medical History:  Diagnosis Date   Anemia    Depression    Diabetes mellitus without complication (HCC)    GERD (gastroesophageal reflux disease)    History of COVID-19    Hyperlipidemia    Hypertension    Legally blind    Macular degeneration, bilateral    Peripheral neuropathy    Post herpetic neuralgia    located in her back   Stroke [facility])     SURGICAL HISTORY: Past Surgical History:  Procedure Laterality Date   ABDOMINAL HYSTERECTOMY     partial   APPENDECTOMY     BREAST SURGERY     reduction   CAROTID PTA/STENT INTERVENTION Left 10/18/2017   Procedure: CAROTID PTA/STENT INTERVENTION;  Surgeon: Algernon Huxley, MD;  Location: [facility];  Service: Cardiovascular;  Laterality: Left;   CATARACT EXTRACTION, BILATERAL     CESAREAN SECTION     x3   COLONOSCOPY     ESOPHAGEAL DILATION      SOCIAL HISTORY: Social History   Socioeconomic History   Marital status: Widowed    Spouse name: Not  on file   Number of children: 3   Years of education: some college   Highest education level: Not on file  Occupational History   Occupation: Disabled  Tobacco Use   Smoking status: Former    Packs/day: 3.00    Years: 50.00    Pack years: 150.00    Types: Cigarettes    Quit date: 2008    Years since quitting: 15.0   Smokeless tobacco: Never   Tobacco comments:    smoking cessation materials not required  Vaping Use   Vaping Use: Never used  Substance and Sexual Activity   Alcohol  use: No   Drug use: No   Sexual activity: Not Currently  Other Topics Concern   Not on file  Social History Narrative   Pt lives with son Judy Harvey   Social Determinants of Health   Financial Resource Strain: Not on file  Food Insecurity: Not on file  Transportation Needs: Not on file  Physical Activity: Not on file  Stress: Not on file  Social Connections: Not on file  Intimate Partner Violence: Not on file    FAMILY HISTORY: Family History  Problem Relation Age of Onset   Healthy Mother    Diabetes Father    Alzheimer's disease Father    Multiple sclerosis Sister    Multiple sclerosis Brother     ALLERGIES:  is allergic to adhesive [tape] and lyrica [pregabalin].  MEDICATIONS:  Current Outpatient Medications  Medication Sig Dispense Refill   acetaminophen (TYLENOL) 325 MG tablet Take 650 mg by mouth every 6 (six) hours as needed for mild pain or fever.     albuterol (ACCUNEB) 1.25 MG/3ML nebulizer solution Take 3 mLs by nebulization every 6 (six) hours as needed for wheezing or shortness of breath.     albuterol (VENTOLIN HFA) 108 (90 Base) MCG/ACT inhaler Inhale 2 puffs into the lungs every 6 (six) hours as needed for shortness of breath or wheezing.     amLODipine (NORVASC) 5 MG tablet Take 5 mg by mouth daily.     aspirin EC 81 MG tablet Take 81 mg by mouth daily.     atorvastatin (LIPITOR) 80 MG tablet TAKE 1 TABLET BY MOUTH EVERYDAY AT BEDTIME (Patient taking differently: Take 80 mg by mouth at bedtime.) 90 tablet 1   Cholecalciferol (VITAMIN D3) 5000 units CAPS Take 1 capsule (5,000 Units total) by mouth daily. 30 capsule    clobetasol ointment (TEMOVATE) [id number] % Apply 1 application topically 2 (two) times daily.     clopidogrel (PLAVIX) 75 MG tablet Take 1 tablet (75 mg total) by mouth daily. 90 tablet 1   clopidogrel (PLAVIX) 75 MG tablet Take 1 tablet by mouth daily. TAKE 1 TABLET BY MOUTH EVERY DAY     DULoxetine (CYMBALTA) 30 MG capsule TAKE 1 CAPSULE BY MOUTH EVERY  DAY (Patient taking differently: Take 30 mg by mouth daily.) 90 capsule 1   DULoxetine (CYMBALTA) 30 MG capsule Take 1 tablet by mouth daily. TAKE 1 CAPSULE BY MOUTH EVERY DAY     ferrous sulfate 325 (65 FE) MG tablet Take 325 mg by mouth daily.     gabapentin (NEURONTIN) 100 MG capsule Take 6 capsules (600 mg total) by mouth 3 (three) times daily.     insulin glargine (LANTUS) 100 UNIT/ML injection Inject 0.4 mLs (40 Units total) into the skin daily. INJECT 50 UNITS INTO THE SKIN DAILY AT 10 PM. 20 mL 5   Insulin Syringe-Needle U-100 (B-D INS SYR ULTRAFINE .  3CC/31G) 31G X 5/16" 0.3 ML MISC Use to administer insulin daily to control diabetes. 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 100 each 3   Melatonin 3 MG CAPS Take by mouth. Take 3 mg by mouth at bedtime     mirtazapine (REMERON) 7.5 MG tablet Take 7.5 mg by mouth at bedtime.     mirtazapine (REMERON) 7.5 MG tablet Take by mouth. Take 1 tablet (7.5 mg total) by mouth at bedtime     nystatin (MYCOSTATIN/NYSTOP) powder Apply topically 2 (two) times daily.     nystatin (MYCOSTATIN/NYSTOP) powder Apply topically 2 (two) times daily. Apply topically 2 (two) times daily     ondansetron (ZOFRAN) 8 MG tablet Take 8 mg by mouth every 8 (eight) hours as needed for nausea or vomiting.      pantoprazole (PROTONIX) 40 MG tablet Take 40 mg by mouth 2 (two) times daily.     pantoprazole (PROTONIX) 40 MG tablet Take by mouth. Take 1 tablet (40 mg total) by mouth 2 (two) times daily before meals     No current facility-administered medications for this visit.     PHYSICAL EXAMINATION: ECOG PERFORMANCE STATUS: 2 - Symptomatic, <50% confined to bed Vitals:   03/30/21 1132  BP: 109/67  Pulse: 83  Temp: (!) 97.5 F (36.4 C)   Filed Weights   03/30/21 1132  Weight: 161 lb (73 kg)    Physical Exam Constitutional:      General: She is not in acute distress.    Comments: Frail appearance elderly female, she sits in a  wheelchair.  HENT:     Head: Normocephalic and atraumatic.  Eyes:     General: No scleral icterus. Cardiovascular:     Rate and Rhythm: Normal rate and regular rhythm.     Heart sounds: Normal heart sounds.  Pulmonary:     Effort: Pulmonary effort is normal. No respiratory distress.     Breath sounds: No wheezing.     Comments: Breathing comfortably via nasal cannula oxygen Abdominal:     Palpations: Abdomen is soft.  Musculoskeletal:        General: No deformity. Normal range of motion.     Cervical back: Normal range of motion and neck supple.  Skin:    General: Skin is warm.     Coloration: Skin is pale.  Neurological:     Mental Status: She is alert. Mental status is at baseline.     Cranial Nerves: No cranial nerve deficit.     Coordination: Coordination normal.  Psychiatric:        Mood and Affect: Mood normal.      CMP Latest Ref Rng & Units 03/19/2021  Glucose 70 - 99 mg/dL 171(H)  BUN 8 - 23 mg/dL 24(H)  Creatinine 0.44 - 1.00 mg/dL 1.28(H)  Sodium 135 - 145 mmol/L 138  Potassium 3.5 - 5.1 mmol/L 3.9  Chloride 98 - 111 mmol/L 103  CO2 22 - 32 mmol/L 32  Calcium 8.9 - 10.3 mg/dL 8.3(L)  Total Protein 6.5 - 8.1 g/dL -  Total Bilirubin 0.3 - 1.2 mg/dL -  Alkaline Phos 38 - 126 U/L -  AST 15 - 41 U/L -  ALT 0 - 44 U/L -   CBC Latest Ref Rng & Units 03/19/2021  WBC 4.0 - 10.5 K/uL 8.4  Hemoglobin 12.0 - 15.0 g/dL 7.5(L)  Hematocrit 36.0 - 46.0 % 24.8(L)  Platelets 150 - 400 K/uL 229     LABORATORY DATA:  I have reviewed the data as listed Lab Results  Component Value Date   WBC 8.4 03/19/2021   HGB 7.5 (L) 03/19/2021   HCT 24.8 (L) 03/19/2021   MCV 85.8 03/19/2021   PLT 229 03/19/2021   Recent Labs    03/12/21 1900 03/13/21 0741 03/17/21 0532 03/17/21 1547 03/18/21 0829 03/19/21 0505  NA 135   < > 142  --  143 138  K 4.8   < > 5.4* 5.2* 4.3 3.9  CL 99   < > 103  --  103 103  CO2 24   < > 31  --  31 32  GLUCOSE 190*   < > 119*  --  138* 171*   BUN 39*   < > 31*  --  23 24*  CREATININE 3.70*   < > 1.63*  --  1.39* 1.28*  CALCIUM 8.7*   < > 8.7*  --  8.6* 8.3*  GFRNONAA 11*   < > 30*  --  36* 40*  PROT 7.0  --   --   --   --   --   ALBUMIN 3.2*  --   --   --   --   --   AST 22  --   --   --   --   --   ALT 13  --   --   --   --   --   ALKPHOS 83  --   --   --   --   --   BILITOT 0.7  --   --   --   --   --    < > = values in this interval not displayed.   Iron/TIBC/Ferritin/ %Sat    Component Value Date/Time   IRON 14 (L) 03/12/2021 2326   IRON 66 08/21/2018 1147   TIBC 361 03/12/2021 2326   FERRITIN 6 (L) 03/12/2021 2326   FERRITIN 31 08/21/2018 1147   IRONPCTSAT 4 (L) 03/12/2021 2326     RADIOGRAPHIC STUDIES: I have personally reviewed the radiological images as listed and agreed with the findings in the report. CT ABDOMEN PELVIS WO CONTRAST  Result Date: 03/13/2021 CLINICAL DATA:  Nausea, vomiting, low back pain, weakness EXAM: CT ABDOMEN AND PELVIS WITHOUT CONTRAST TECHNIQUE: Multidetector CT imaging of the abdomen and pelvis was performed following the standard protocol without IV contrast. COMPARISON:  None. FINDINGS: Lower chest: Mild bibasilar atelectasis. Extensive multi-vessel coronary artery calcification. Hypoattenuation of the cardiac blood pool is in keeping with moderate anemia. Moderate hiatal hernia. Hepatobiliary: No focal liver abnormality is seen. No gallstones, gallbladder wall thickening, or biliary dilatation. Pancreas: Unremarkable Spleen: Unremarkable Adrenals/Urinary Tract: The adrenal glands are unremarkable. The kidneys are normal in size and position. 822 mm hyperdense cyst is seen within the upper pole of the right kidney, stable since prior examination. Simple cortical cyst noted arising exophytically from the interpolar region of the right kidney. The kidneys are otherwise unremarkable. Bladder unremarkable. Stomach/Bowel: Stomach is within normal limits. Appendix is absent. Mild sigmoid  diverticulosis. No evidence of bowel wall thickening, distention, or inflammatory changes. Vascular/Lymphatic: Moderate aortoiliac atherosclerotic calcification. Previously noted ulcer-like projection within the infrarenal abdominal aorta is not well assessed on this noncontrast examination. No abdominal aortic aneurysm. No pathologic adenopathy within the abdomen and pelvis. Reproductive: Uterus and bilateral adnexa are unremarkable. Other: No abdominal wall hernia.  No free intraperitoneal fluid. Musculoskeletal: Remote T12 superior endplate fracture with QA348G loss of height is unchanged. Degenerative changes  are noted within the lumbar spine. No acute bone abnormality. IMPRESSION: No acute intra-abdominal pathology identified. No definite radiographic explanation for the patient's reported symptoms. Distal colonic diverticulosis without superimposed acute inflammatory change. Extensive coronary artery calcification. Moderate hiatal hernia. Aortic Atherosclerosis (ICD10-I70.0). Electronically Signed   By: Fidela Salisbury M.D.   On: 03/13/2021 03:00   DG Chest 1 View  Result Date: 03/12/2021 CLINICAL DATA:  Weakness, lower back pain EXAM: CHEST  1 VIEW COMPARISON:  10/25/2020 FINDINGS: Single frontal view of the chest demonstrates an unremarkable cardiac silhouette. No acute airspace disease. Minimal blunting of the left costophrenic angle may reflect a small left pleural effusion. No pneumothorax. No acute bony abnormalities. Chronic right rib fractures. IMPRESSION: 1. Trace left pleural effusion versus pleural thickening. 2. Otherwise unremarkable exam. Electronically Signed   By: Randa Ngo M.D.   On: 03/12/2021 19:06   US Abdomen Limited RUQ (LIVER/GB)  Result Date: 03/13/2021 CLINICAL DATA:  Nausea. EXAM: ULTRASOUND ABDOMEN LIMITED RIGHT UPPER QUADRANT COMPARISON:  CT AP 03/13/2021 FINDINGS: Gallbladder: No gallstones or wall thickening visualized. No sonographic Murphy sign noted by  sonographer. Common bile duct: Diameter: 3.8 mm Liver: No focal lesion identified. Within normal limits in parenchymal echogenicity. Portal vein is patent on color Doppler imaging with normal direction of blood flow towards the liver. Other: None. IMPRESSION: Normal exam. Electronically Signed   By: Kerby Moors M.D.   On: 03/13/2021 06:02       ASSESSMENT & PLAN:  1. Iron deficiency anemia, unspecified iron deficiency anemia type   2. Anemia of chronic renal failure, stage 3b (HCC)    Anemia, Labs are reviewed and discussed with patient. Consistent with iron deficiency anemia.  Also an appointment of anemia secondary to chronic kidney disease. Rationale and potential side effects of IV iron were discussed in details.  Recommend Venofer 200mg  weekly x 4 doses. Allergy reactions/infusion reaction including anaphylactic reaction discussed with patient. Other side effects include but not limited to high blood pressure, skin rash, weight gain, leg swelling, etc. Patient voices understanding and willing to proceed.  Suspect underlying GI bleeding.  She is on aspirin and Plavix for stroke prophylaxis.  With patient's advanced age, multiple comorbidities, she may not tolerate aggressive GI work-up.  I encourage patient to further discuss with primary care provider to see if it is okay to temporarily hold off Plavix.  Encourage patient/her son to call Dr. Blane Ohara office and ask.  Orders Placed This Encounter  Procedures   CBC with Differential/Platelet    Standing Status:   Future    Standing Expiration Date:   03/30/2022   Ferritin    Standing Status:   Future    Standing Expiration Date:   03/30/2022   Iron and TIBC    Standing Status:   Future    Standing Expiration Date:   03/30/2022    All questions were answered. The patient knows to call the clinic with any problems questions or concerns.  Cc Judy Harvey, Judy Rud, MD  Return of visit: 3 months Thank you for this kind referral and the  opportunity to participate in the care of this patient. A copy of today's note is routed to referring provider   Earlie Server, MD, PhD 03/30/2021

## 2021-04-01 ENCOUNTER — Ambulatory Visit (INDEPENDENT_AMBULATORY_CARE_PROVIDER_SITE_OTHER): Payer: Medicare Other | Admitting: Urology

## 2021-04-01 ENCOUNTER — Encounter: Payer: Self-pay | Admitting: Urgent Care

## 2021-04-01 ENCOUNTER — Ambulatory Visit: Payer: Medicare Other | Admitting: Urology

## 2021-04-01 ENCOUNTER — Encounter: Payer: Self-pay | Admitting: Urology

## 2021-04-01 ENCOUNTER — Other Ambulatory Visit: Payer: Self-pay

## 2021-04-01 VITALS — BP 118/82 | HR 90

## 2021-04-01 DIAGNOSIS — Z466 Encounter for fitting and adjustment of urinary device: Secondary | ICD-10-CM

## 2021-04-01 DIAGNOSIS — R339 Retention of urine, unspecified: Secondary | ICD-10-CM | POA: Diagnosis not present

## 2021-04-01 LAB — BLADDER SCAN AMB NON-IMAGING

## 2021-04-01 MED ORDER — CEPHALEXIN 250 MG PO CAPS
500.0000 mg | ORAL_CAPSULE | Freq: Once | ORAL | Status: AC
  Administered 2021-04-01: 500 mg via ORAL

## 2021-04-01 NOTE — Progress Notes (Signed)
Catheter Removal  Patient is present today for a catheter removal. 72ml of water was drained from the balloon. A 16FR foley cath was removed from the bladder no complications were noted. Patient tolerated well.  Performed by: Debbe Bales, CMA   Follow up/ Additional notes: RTC for afternoon PVR

## 2021-04-01 NOTE — Addendum Note (Signed)
Addended by: Earlie Server on: 04/01/2021 08:29 AM   Modules accepted: Orders

## 2021-04-01 NOTE — Progress Notes (Signed)
04/01/21 9:11 AM   Judy Harvey 02/18/1928 [id number]  CC: Urinary retention, AKI   HPI: 88 year old female here with her son today who is her POA.  He provides the entire history as she is extremely hard of hearing and legally blind.  She has a number of comorbidities including CAD, hypertension, type 2 diabetes, legally blind, chronic anemia who presented to the ED on 03/12/2021 with increased weakness and 3 falls.  Noted to have acute kidney injury likely secondary to decreased p.o. intake as well as anemia.  A Foley was replaced on 03/16/2020 for reported incomplete emptying, but those notes are unavailable to me, and there are no PVRs that I am able to find in the chart.  A CT was performed on 03/13/2021 and showed no abnormalities, bladder was decompressed, no hydronephrosis.  Renal function improved with hydration and supportive care.  They deny any urinary problems prior to catheter placement during this hospitalization.   PMH: Past Medical History:  Diagnosis Date   Anemia    Depression    Diabetes mellitus without complication (HCC)    GERD (gastroesophageal reflux disease)    History of COVID-19    Hyperlipidemia    Hypertension    Legally blind    Macular degeneration, bilateral    Peripheral neuropathy    Post herpetic neuralgia    located in her back   Stroke [facility])     Surgical History: Past Surgical History:  Procedure Laterality Date   ABDOMINAL HYSTERECTOMY     partial   APPENDECTOMY     BREAST SURGERY     reduction   CAROTID PTA/STENT INTERVENTION Left 10/18/2017   Procedure: CAROTID PTA/STENT INTERVENTION;  Surgeon: Annice Needy, MD;  Location: [facility];  Service: Cardiovascular;  Laterality: Left;   CATARACT EXTRACTION, BILATERAL     CESAREAN SECTION     x3   COLONOSCOPY     ESOPHAGEAL DILATION     Family History: Family History  Problem Relation Age of Onset   Healthy Mother    Diabetes Father    Alzheimer's disease Father     Multiple sclerosis Sister    Multiple sclerosis Brother     Social History:  reports that she quit smoking about 15 years ago. Her smoking use included cigarettes. She has a 150.00 pack-year smoking history. She has never used smokeless tobacco. She reports that she does not drink alcohol and does not use drugs.  Physical Exam: BP 118/82 (BP Location: Left Arm, Patient Position: Sitting, Cuff Size: Large)    Pulse 90    Constitutional: Elderly, frail-appearing Foley with yellow urine  Laboratory Data: Reviewed, see HPI  Pertinent Imaging: I have personally viewed and interpreted the CT abdomen and pelvis 03/13/2021 as well as 10/25/2020 that shows no hydronephrosis, no evidence of bladder distention or other urologic abnormalities  Assessment & Plan:   Comorbid 88 year old female with reported urinary retention that prompted Foley catheter placement during recent hospitalization.  Keflex was given for prophylaxis and she was able to void spontaneously today, and returned this afternoon to clinic with a PVR of 0 mL.  Return precautions were discussed at length, and she can follow-up with urology as needed  I spent 45 total minutes on the day of the encounter including pre-visit review of the medical record, face-to-face time with the patient, and post visit ordering of labs/imaging/tests.  Legrand Rams, MD 04/01/2021  [facility] [address] [place], [place] [address] [phone]  [phone] ° ° °

## 2021-04-02 ENCOUNTER — Telehealth: Payer: Self-pay | Admitting: Urology

## 2021-04-02 NOTE — Telephone Encounter (Signed)
Pt was seen by Dr. Diamantina Providence yesterday and [facility] called to say that the patient did not use the bathroom at all last night and they had to put a cath in and will probably be calling back to schedule her for another V&T. They did not want to schedule at this time, but just wanted you to be aware.

## 2021-04-05 ENCOUNTER — Inpatient Hospital Stay: Payer: Medicare Other

## 2021-04-12 ENCOUNTER — Inpatient Hospital Stay: Payer: Medicare Other

## 2021-04-13 ENCOUNTER — Ambulatory Visit: Payer: Medicare Other

## 2021-04-13 ENCOUNTER — Ambulatory Visit (INDEPENDENT_AMBULATORY_CARE_PROVIDER_SITE_OTHER): Payer: Medicare Other | Admitting: *Deleted

## 2021-04-13 ENCOUNTER — Other Ambulatory Visit: Payer: Self-pay

## 2021-04-13 DIAGNOSIS — R339 Retention of urine, unspecified: Secondary | ICD-10-CM | POA: Diagnosis not present

## 2021-04-13 LAB — BLADDER SCAN AMB NON-IMAGING: Scan Result: 59

## 2021-04-13 NOTE — Addendum Note (Signed)
Addended by: Levada Schilling on: 04/13/2021 02:54 PM   Modules accepted: Orders

## 2021-04-13 NOTE — Progress Notes (Signed)
Catheter Removal  Patient is present today for a catheter removal.  60ml of water was drained from the balloon. A 16 FR foley cath was removed from the bladder no complications were noted . Patient tolerated well.  Performed by: Gaspar Cola CMA   Follow up/ Additional notes: Patient to return this afternoon for a PVR

## 2021-04-19 ENCOUNTER — Inpatient Hospital Stay: Payer: Medicare Other | Attending: Oncology

## 2021-04-19 DIAGNOSIS — D509 Iron deficiency anemia, unspecified: Secondary | ICD-10-CM | POA: Insufficient documentation

## 2021-04-20 ENCOUNTER — Telehealth: Payer: Self-pay | Admitting: Primary Care

## 2021-04-20 NOTE — Telephone Encounter (Signed)
Spoke with patient's son Loraine Leriche regarding the Palliative referral/services and have scheduled an In-home Consult for 04/27/21 @ 9 AM.

## 2021-04-26 ENCOUNTER — Inpatient Hospital Stay: Payer: Medicare Other

## 2021-04-27 ENCOUNTER — Other Ambulatory Visit: Payer: Medicare Other | Admitting: Primary Care

## 2021-04-29 ENCOUNTER — Telehealth: Payer: Self-pay

## 2021-04-29 NOTE — Telephone Encounter (Signed)
Spoke with patient's son Loraine Leriche and rescheduled Palliative Care consult to 05/12/21 @ 12:30 PM.

## 2021-05-06 ENCOUNTER — Inpatient Hospital Stay: Payer: Medicare Other

## 2021-05-06 ENCOUNTER — Other Ambulatory Visit: Payer: Self-pay

## 2021-05-06 VITALS — BP 143/67 | HR 76 | Temp 97.0°F | Resp 18

## 2021-05-06 DIAGNOSIS — D509 Iron deficiency anemia, unspecified: Secondary | ICD-10-CM | POA: Diagnosis not present

## 2021-05-06 MED ORDER — SODIUM CHLORIDE 0.9 % IV SOLN: 200.0000 mg | Freq: Once | INTRAVENOUS | Status: DC

## 2021-05-06 MED ORDER — IRON SUCROSE 20 MG/ML IV SOLN
200.0000 mg | Freq: Once | INTRAVENOUS | Status: AC
  Administered 2021-05-06: 200 mg via INTRAVENOUS
  Filled 2021-05-06: qty 10

## 2021-05-06 MED ORDER — SODIUM CHLORIDE 0.9 % IV SOLN
Freq: Once | INTRAVENOUS | Status: AC
  Filled 2021-05-06: qty 250

## 2021-05-06 NOTE — Patient Instructions (Signed)
[facility]   ?Discharge Instructions: ?Thank you for choosing [facility] to provide your oncology and hematology care.  ?If you have a lab appointment with the Cancer Center, please go directly to the Cancer Center and check in at the registration area. ?  ?We strive to give you quality time with your provider. You may need to reschedule your appointment if you arrive late (15 or more minutes).  Arriving late affects you and other patients whose appointments are after yours.  Also, if you miss three or more appointments without notifying the office, you may be dismissed from the clinic at the provider?s discretion.    ?  ?For prescription refill requests, have your pharmacy contact our office and allow 72 hours for refills to be completed.   ? ?Today you received the following: Venofer.    ?  ?BELOW ARE SYMPTOMS THAT SHOULD BE REPORTED IMMEDIATELY: ?*FEVER GREATER THAN 100.4 F (38 ?C) OR HIGHER ?*CHILLS OR SWEATING ?*NAUSEA AND VOMITING THAT IS NOT CONTROLLED WITH YOUR NAUSEA MEDICATION ?*UNUSUAL SHORTNESS OF BREATH ?*UNUSUAL BRUISING OR BLEEDING ?*URINARY PROBLEMS (pain or burning when urinating, or frequent urination) ?*BOWEL PROBLEMS (unusual diarrhea, constipation, pain near the anus) ?TENDERNESS IN MOUTH AND THROAT WITH OR WITHOUT PRESENCE OF ULCERS (sore throat, sores in mouth, or a toothache) ?UNUSUAL RASH, SWELLING OR PAIN  ?UNUSUAL VAGINAL DISCHARGE OR ITCHING  ? ?Items with * indicate a potential emergency and should be followed up as soon as possible or go to the Emergency Department if any problems should occur. ? ?Should you have questions after your visit or need to cancel or reschedule your appointment, please contact [facility]  Dept: [phone]  and follow the prompts.  Office hours are 8:00 a.m. to 4:30 p.m. Monday - Friday. Please note that voicemails left after 4:00 p.m. may not be returned until the following  business day.  We are closed weekends and major holidays. You have access to a nurse at all times for urgent questions. Please call the main number to the clinic Dept: [phone] and follow the prompts. ? ?For any non-urgent questions, you may also contact your provider using MyChart. We now offer e-Visits for anyone 18 and older to request care online for non-urgent symptoms. For details visit [email or web address]. ?  ?Also download the MyChart app! Go to the app store, search "MyChart", open the app, select [facility], and log in with your MyChart username and password. ? ?Due to Covid, a mask is required upon entering the hospital/clinic. If you do not have a mask, one will be given to you upon arrival. For doctor visits, patients may have 1 support person aged 18 or older with them. For treatment visits, patients cannot have anyone with them due to current Covid guidelines and our immunocompromised population.  ?

## 2021-05-07 ENCOUNTER — Inpatient Hospital Stay: Payer: Medicare Other

## 2021-05-12 ENCOUNTER — Other Ambulatory Visit: Payer: Medicare Other | Admitting: Primary Care

## 2021-05-12 ENCOUNTER — Encounter: Payer: Self-pay | Admitting: Oncology

## 2021-05-12 ENCOUNTER — Other Ambulatory Visit: Payer: Self-pay

## 2021-05-12 DIAGNOSIS — Z8673 Personal history of transient ischemic attack (TIA), and cerebral infarction without residual deficits: Secondary | ICD-10-CM

## 2021-05-12 DIAGNOSIS — E119 Type 2 diabetes mellitus without complications: Secondary | ICD-10-CM

## 2021-05-12 DIAGNOSIS — I493 Ventricular premature depolarization: Secondary | ICD-10-CM

## 2021-05-12 DIAGNOSIS — E669 Obesity, unspecified: Secondary | ICD-10-CM

## 2021-05-12 DIAGNOSIS — E66811 Obesity, class 1: Secondary | ICD-10-CM

## 2021-05-12 DIAGNOSIS — Z794 Long term (current) use of insulin: Secondary | ICD-10-CM

## 2021-05-12 DIAGNOSIS — Z515 Encounter for palliative care: Secondary | ICD-10-CM

## 2021-05-12 NOTE — Progress Notes (Signed)
[facility] Consult Note Telephone: [phone]  Fax: [phone]   Date of encounter: 05/12/21 12:48 PM PATIENT NAME: Judy Harvey [address] [place] [address]   [phone] (home)  DOB: April 10, 1927 MRN: [id number] PRIMARY CARE PROVIDER:    Leonel Ramsay, MD,  [place] [address] [phone]  REFERRING PROVIDER:   Leonel Ramsay, MD [place],  [place] [address] [phone]  RESPONSIBLE PARTY:    Contact Information     Name Relation Home Work Ly[place] Son [phone]  [phone]       I met face to face with patient and family in  home. Palliative Care was asked to follow this patient by consultation request of  Leonel Ramsay, MD to address advance care planning and complex medical decision making. This is the initial visit.                                     ASSESSMENT AND PLAN / RECOMMENDATIONS:   Advance Care Planning/Goals of Care: Goals include to maximize quality of life and symptom management. Patient/health care surrogate gave his/her permission to discuss.Our advance care planning conversation included a discussion about:    The value and importance of advance care planning  Exploration of personal, cultural or spiritual beliefs that might influence medical decisions  Exploration of goals of care in the event of a sudden injury or illness  Identification of a healthcare agent - son Review of an  advance directive document - Left 5 Wishes and MOSt for review CODE STATUS: States to try for a short time if her heart stops but wanted to think about it.  Symptom Management/Plan:  Met today for first time, Son had gone to store at time of our visit (pre arranged). He returned and gave some history.   Medication Review: Reviewed meds with patient. She states her son will not give her gabapentin tid because he feels she takes too  much medicine(unsubstantiated) .   Insomnia: Has tried mirtazipine 7.5 mg and that has been titrated to approx 10 mg at hs. I recommend increasing to 15 mg as she still is not sleeping. She is a poor historian as well. Discussed melatonin, restricting drinks and caffeine at dinner .  Diabetes: Insulin dependent. Son endorses she is taking 42 units. She has a glucose meter but they don't check sugars. Her strips are dated 2018. I have ordered new strips and sent to their pharmacy.   Urination: She has urinary urgency  and h/o retention, and other people are sometimes in the bathroom. She requests a BSC for toileting due to bathroom unavailability.  Home health th provide.  Dyspnea: Denies at tihs time.Has oxygen concentrator in the home but not using it. Not clear where it came from .   Stamina: Voices poor,able to walk but a taxing effort. Doing iron infusions at cancer center.  Has nurse and PT from [facility]. She has not been by them since 2020.  ADL Dependence:  Has a home aid for a few hours each day from [place] [facility]. Needs set up of medications, drinks and food due to blindness. Patient was in hospital in Dec with dehydration. There is water at her bedside but she has not yet taken AM meds due to needing help to administer. Dependent in all IADLS and most  adls due to memory impairment and blindness. [place] her son and his girlfriend.   I am attempting to find out which home health is in.  Follow up Palliative Care Visit: Palliative care will continue to follow for complex medical decision making, advance care planning, and clarification of goals. Return 4 weeks or prn.  I spent 75 minutes providing this consultation. More than 50% of the time in this consultation was spent in counseling and care coordination.  PPS: 40%  HOSPICE ELIGIBILITY/DIAGNOSIS: TBD  Chief Complaint: debility, fall risk, blindness, deficits in self care  HISTORY  OF PRESENT ILLNESS:  Judy Harvey is a 88 y.o. year old female  with h/o AKI, DM, HTN, dementia, frequent falls. Seen today to address self care deficits and palliative goals, ACP and  I am seeking if she has home health as well.  History obtained from review of EMR, discussion with primary team, and interview with family, facility staff/caregiver and/or Ms. Zetino.  I reviewed available labs, medications, imaging, studies and related documents from the EMR.  Records reviewed and summarized above.   ROS   General: NAD EYES: endorses blindness ENMT: denies dysphagia Cardiovascular: denies chest pain, denies DOE Pulmonary: endorses cough,denies  increased SOB Abdomen: endorses good appetite, denies constipation, endorses continence of bowel GU: denies dysuria, endorses continence of urine MSK:  endorses increased weakness, + falls reported Skin: denies rashes or wounds Neurological: denies pain, endorses  insomnia Psych: Endorses positive mood Heme/lymph/immuno: denies bruises, abnormal bleeding  Physical Exam: Current and past weights: 161 lbs per record Constitutional: 139/63 HR 71 RR 18, BG 155 FBS General: frail appearing, WNWD EYES: anicteric sclera, lids intact, no discharge  ENMT: hard of hearing, oral mucous membranes moist CV: slight LE edema Pulmonary:  no increased work of breathing, no cough, room air Abdomen: intake 50%,  no ascites GU: deferred MSK: + sarcopenia, moves all extremities, ambulatory with walker  Skin: warm and dry, no rashes or wounds on visible skin Neuro:  + generalized weakness, moderately severe cognitive impairment Psych: non-anxious affect, A and O x 3 Hem/lymph/immuno: no widespread bruising CURRENT PROBLEM LIST:  Patient Active Problem List   Diagnosis Date Noted   ARF (acute renal failure) ([facility]) 03/12/2021   Hypotension due to hypovolemia    Acute respiratory failure with hypoxia ([facility])    Community acquired pneumonia 10/25/2020    Major depressive 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 ([facility]) 08/21/2018   Low vitamin B12 level 04/26/2018   Iron deficiency anemia 03/28/2018   Frequent PVCs 03/21/2018   PSVT (paroxysmal supraventricular tachycardia) ([facility]) 03/21/2018   Normocytic anemia 02/28/2018   Benign essential HTN 02/20/2018   SOBOE (shortness of breath on exertion) 02/20/2018   Carotid stenosis, symptomatic w/o infarct, left 10/18/2017   TIA (transient ischemic attack) 06/16/2017   Bilateral carotid artery stenosis 06/16/2017   Hyperlipidemia 06/16/2017   Obesity (BMI 30.0-34.9) 05/02/2016   Vitamin D deficiency 03/25/2016   Type 2 diabetes mellitus without complication, with long-term current use of insulin ([facility]) 03/24/2016   Old cerebrovascular accident (CVA) without late effect 03/24/2016   Hypertension 03/24/2016   Legally blind 03/24/2016   Macular degeneration 03/24/2016   Constipation 03/24/2016   History of esophageal stricture 03/24/2016   Depression with anxiety 03/24/2016   Restless leg syndrome 03/24/2016   Postherpetic neuralgia 03/24/2016   Gait abnormality 03/24/2016   PAST MEDICAL HISTORY:  Active Ambulatory Problems    Diagnosis Date Noted  Type 2 diabetes mellitus without complication, with long-term current use of insulin ([facility]) 03/24/2016   Old cerebrovascular accident (CVA) without late effect 03/24/2016   Hypertension 03/24/2016   Legally blind 03/24/2016   Macular degeneration 03/24/2016   Constipation 03/24/2016   History of esophageal stricture 03/24/2016   Depression with anxiety 03/24/2016   Restless leg syndrome 03/24/2016   Postherpetic neuralgia 03/24/2016   Gait abnormality 03/24/2016   Vitamin D deficiency 03/25/2016   Obesity (BMI 30.0-34.9) 05/02/2016   TIA (transient ischemic attack) 06/16/2017   Bilateral carotid artery stenosis 06/16/2017   Hyperlipidemia 06/16/2017   Carotid stenosis, symptomatic w/o infarct, left 10/18/2017   Normocytic anemia 02/28/2018   Iron  deficiency anemia 03/28/2018   Benign essential HTN 02/20/2018   Frequent PVCs 03/21/2018   PSVT (paroxysmal supraventricular tachycardia) ([facility]) 03/21/2018   SOBOE (shortness of breath on exertion) 02/20/2018   Low vitamin B12 level 04/26/2018   Major depressive 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 ([facility]) 08/21/2018   Community acquired pneumonia 10/25/2020   Acute respiratory failure with hypoxia ([facility])    ARF (acute renal failure) ([facility]) 03/12/2021   Hypotension due to hypovolemia    Resolved Ambulatory Problems    Diagnosis Date Noted   Rib fractures 03/24/2016   Myalgia 03/24/2016   Nausea 03/24/2016   Hypocalcemia 03/24/2016   Past Medical History:  Diagnosis Date   Anemia    Depression    Diabetes mellitus without complication ([facility])    GERD (gastroesophageal reflux disease)    History of COVID-19    Macular degeneration, bilateral    Peripheral neuropathy    Post herpetic neuralgia    Stroke ([facility])    SOCIAL HX:  Social History   Tobacco Use   Smoking status: Former    Packs/day: 3.00    Years: 50.00    Pack years: 150.00    Types: Cigarettes    Quit date: 2008    Years since quitting: 15.1   Smokeless tobacco: Never   Tobacco comments:    smoking cessation materials not required  Substance Use Topics   Alcohol use: No   FAMILY HX:  Family History  Problem Relation Age of Onset   Healthy Mother    Diabetes Father    Alzheimer's disease Father    Multiple sclerosis Sister    Multiple sclerosis Brother       ALLERGIES:  Allergies  Allergen Reactions   Adhesive [Tape] Other (See Comments)    "pulls my skin off" paper tape ok   Lyrica [Pregabalin]     Body spasms     PERTINENT MEDICATIONS:  Outpatient Encounter Medications as of 05/12/2021  Medication Sig   acetaminophen (TYLENOL) 325 MG tablet Take 650 mg by mouth every 6 (six) hours as needed for mild pain or fever.   albuterol (ACCUNEB) 1.25 MG/3ML nebulizer solution Take 3 mLs by nebulization every 6  (six) hours as needed for wheezing or shortness of breath.   albuterol (VENTOLIN HFA) 108 (90 Base) MCG/ACT inhaler Inhale 2 puffs into the lungs every 6 (six) hours as needed for shortness of breath or wheezing.   amLODipine (NORVASC) 5 MG tablet Take 5 mg by mouth daily.   aspirin EC 81 MG tablet Take 81 mg by mouth daily.   atorvastatin (LIPITOR) 80 MG tablet TAKE 1 TABLET BY MOUTH EVERYDAY AT BEDTIME (Patient taking differently: Take 80 mg by mouth at bedtime.)   Cholecalciferol (VITAMIN D3) 5000 units CAPS Take 1 capsule (5,000 Units total) by mouth daily.  clobetasol ointment (TEMOVATE) 5.46 % Apply 1 application topically 2 (two) times daily.   clopidogrel (PLAVIX) 75 MG tablet Take 1 tablet (75 mg total) by mouth daily.   clopidogrel (PLAVIX) 75 MG tablet Take 1 tablet by mouth daily. TAKE 1 TABLET BY MOUTH EVERY DAY   DULoxetine (CYMBALTA) 30 MG capsule TAKE 1 CAPSULE BY MOUTH EVERY DAY (Patient taking differently: Take 30 mg by mouth daily.)   DULoxetine (CYMBALTA) 30 MG capsule Take 1 tablet by mouth daily. TAKE 1 CAPSULE BY MOUTH EVERY DAY   ferrous sulfate 325 (65 FE) MG tablet Take 325 mg by mouth daily.   gabapentin (NEURONTIN) 100 MG capsule Take 6 capsules (600 mg total) by mouth 3 (three) times daily.   insulin glargine (LANTUS) 100 UNIT/ML injection Inject 0.4 mLs (40 Units total) into the skin daily. INJECT 50 UNITS INTO THE SKIN DAILY AT 10 PM.   Insulin Syringe-Needle U-100 (B-D INS SYR ULTRAFINE .3CC/31G) 31G X 5/16" 0.3 ML MISC Use to administer insulin daily to control diabetes. 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   Melatonin 3 MG CAPS Take by mouth. Take 3 mg by mouth at bedtime   metFORMIN (GLUCOPHAGE-XR) 500 MG 24 hr tablet Take 500 mg by mouth 2 (two) times daily.   mirtazapine (REMERON) 7.5 MG tablet Take 7.5 mg by mouth at bedtime.   mirtazapine (REMERON) 7.5 MG tablet Take by mouth. Take 1 tablet (7.5 mg total) by mouth at bedtime    nystatin (MYCOSTATIN/NYSTOP) powder Apply topically 2 (two) times daily.   nystatin (MYCOSTATIN/NYSTOP) powder Apply topically 2 (two) times daily. Apply topically 2 (two) times daily   ondansetron (ZOFRAN) 8 MG tablet Take 8 mg by mouth every 8 (eight) hours as needed for nausea or vomiting.    pantoprazole (PROTONIX) 40 MG tablet Take 40 mg by mouth 2 (two) times daily.   pantoprazole (PROTONIX) 40 MG tablet Take by mouth. Take 1 tablet (40 mg total) by mouth 2 (two) times daily before meals   No facility-administered encounter medications on file as of 05/12/2021.     Thank you for the opportunity to participate in the care of Ms. Mccannon.  The palliative care team will continue to follow. Please call our office at [phone] if we can be of additional assistance.   Jason Coop, NP , DNP, AGPCNP-BC  COVID-19 PATIENT SCREENING TOOL Asked and negative response unless otherwise noted:  Have you had symptoms of covid, tested positive or been in contact with someone with symptoms/positive test in the past 5-10 days?

## 2021-05-14 ENCOUNTER — Observation Stay: Payer: Medicare Other

## 2021-05-14 ENCOUNTER — Observation Stay
Admission: EM | Admit: 2021-05-14 | Discharge: 2021-05-15 | Disposition: A | Discharge status: Home Health | Payer: Medicare Other | Attending: Family Medicine | Admitting: Family Medicine

## 2021-05-14 ENCOUNTER — Other Ambulatory Visit: Payer: Self-pay

## 2021-05-14 ENCOUNTER — Inpatient Hospital Stay: Payer: Medicare Other | Attending: Oncology

## 2021-05-14 ENCOUNTER — Emergency Department: Payer: Medicare Other

## 2021-05-14 ENCOUNTER — Telehealth: Payer: Self-pay

## 2021-05-14 DIAGNOSIS — Z794 Long term (current) use of insulin: Secondary | ICD-10-CM | POA: Diagnosis not present

## 2021-05-14 DIAGNOSIS — E119 Type 2 diabetes mellitus without complications: Secondary | ICD-10-CM | POA: Insufficient documentation

## 2021-05-14 DIAGNOSIS — I639 Cerebral infarction, unspecified: Principal | ICD-10-CM | POA: Insufficient documentation

## 2021-05-14 DIAGNOSIS — Z20822 Contact with and (suspected) exposure to covid-19: Secondary | ICD-10-CM | POA: Diagnosis not present

## 2021-05-14 DIAGNOSIS — Z7984 Long term (current) use of oral hypoglycemic drugs: Secondary | ICD-10-CM | POA: Diagnosis not present

## 2021-05-14 DIAGNOSIS — Z79899 Other long term (current) drug therapy: Secondary | ICD-10-CM | POA: Diagnosis not present

## 2021-05-14 DIAGNOSIS — Z87891 Personal history of nicotine dependence: Secondary | ICD-10-CM | POA: Insufficient documentation

## 2021-05-14 DIAGNOSIS — Z7982 Long term (current) use of aspirin: Secondary | ICD-10-CM | POA: Insufficient documentation

## 2021-05-14 DIAGNOSIS — I10 Essential (primary) hypertension: Secondary | ICD-10-CM | POA: Insufficient documentation

## 2021-05-14 DIAGNOSIS — R200 Anesthesia of skin: Secondary | ICD-10-CM | POA: Diagnosis present

## 2021-05-14 DIAGNOSIS — G459 Transient cerebral ischemic attack, unspecified: Secondary | ICD-10-CM

## 2021-05-14 DIAGNOSIS — Z8673 Personal history of transient ischemic attack (TIA), and cerebral infarction without residual deficits: Secondary | ICD-10-CM | POA: Insufficient documentation

## 2021-05-14 DIAGNOSIS — D509 Iron deficiency anemia, unspecified: Secondary | ICD-10-CM | POA: Diagnosis present

## 2021-05-14 DIAGNOSIS — R531 Weakness: Secondary | ICD-10-CM | POA: Diagnosis not present

## 2021-05-14 DIAGNOSIS — H548 Legal blindness, as defined in USA: Secondary | ICD-10-CM | POA: Diagnosis present

## 2021-05-14 DIAGNOSIS — R41841 Cognitive communication deficit: Secondary | ICD-10-CM | POA: Diagnosis not present

## 2021-05-14 DIAGNOSIS — H3530 Unspecified macular degeneration: Secondary | ICD-10-CM | POA: Diagnosis present

## 2021-05-14 DIAGNOSIS — J690 Pneumonitis due to inhalation of food and vomit: Secondary | ICD-10-CM | POA: Diagnosis not present

## 2021-05-14 DIAGNOSIS — J9601 Acute respiratory failure with hypoxia: Secondary | ICD-10-CM

## 2021-05-14 DIAGNOSIS — E785 Hyperlipidemia, unspecified: Secondary | ICD-10-CM | POA: Diagnosis present

## 2021-05-14 LAB — DIFFERENTIAL
Abs Immature Granulocytes: 0.05 10*3/uL (ref 0.00–0.07)
Basophils Absolute: 0.1 10*3/uL (ref 0.0–0.1)
Basophils Relative: 1 %
Eosinophils Absolute: 0.3 10*3/uL (ref 0.0–0.5)
Eosinophils Relative: 3 %
Immature Granulocytes: 1 %
Lymphocytes Relative: 11 %
Lymphs Abs: 1.1 10*3/uL (ref 0.7–4.0)
Monocytes Absolute: 0.5 10*3/uL (ref 0.1–1.0)
Monocytes Relative: 5 %
Neutro Abs: 8 10*3/uL — ABNORMAL HIGH (ref 1.7–7.7)
Neutrophils Relative %: 79 %
Smear Review: NORMAL

## 2021-05-14 LAB — CBC
HCT: 29.5 % — ABNORMAL LOW (ref 36.0–46.0)
Hemoglobin: 8.3 g/dL — ABNORMAL LOW (ref 12.0–15.0)
MCH: 25.2 pg — ABNORMAL LOW (ref 26.0–34.0)
MCHC: 28.1 g/dL — ABNORMAL LOW (ref 30.0–36.0)
MCV: 89.7 fL (ref 80.0–100.0)
Platelets: 322 10*3/uL (ref 150–400)
RBC: 3.29 MIL/uL — ABNORMAL LOW (ref 3.87–5.11)
RDW: 22.7 % — ABNORMAL HIGH (ref 11.5–15.5)
WBC: 10 10*3/uL (ref 4.0–10.5)
nRBC: 0 % (ref 0.0–0.2)

## 2021-05-14 LAB — COMPREHENSIVE METABOLIC PANEL
ALT: 8 U/L (ref 0–44)
AST: 12 U/L — ABNORMAL LOW (ref 15–41)
Albumin: 3 g/dL — ABNORMAL LOW (ref 3.5–5.0)
Alkaline Phosphatase: 70 U/L (ref 38–126)
Anion gap: 8 (ref 5–15)
BUN: 25 mg/dL — ABNORMAL HIGH (ref 8–23)
CO2: 29 mmol/L (ref 22–32)
Calcium: 8.5 mg/dL — ABNORMAL LOW (ref 8.9–10.3)
Chloride: 102 mmol/L (ref 98–111)
Creatinine, Ser: 1.02 mg/dL — ABNORMAL HIGH (ref 0.44–1.00)
GFR, Estimated: 52 mL/min — ABNORMAL LOW (ref >60)
Glucose, Bld: 152 mg/dL — ABNORMAL HIGH (ref 70–99)
Potassium: 3.3 mmol/L — ABNORMAL LOW (ref 3.5–5.1)
Sodium: 139 mmol/L (ref 135–145)
Total Bilirubin: 0.3 mg/dL (ref 0.3–1.2)
Total Protein: 6.4 g/dL — ABNORMAL LOW (ref 6.5–8.1)

## 2021-05-14 LAB — PROTIME-INR
INR: 1.1 (ref 0.8–1.2)
Prothrombin Time: 14 seconds (ref 11.4–15.2)

## 2021-05-14 LAB — CBG MONITORING, ED: Glucose-Capillary: 116 mg/dL — ABNORMAL HIGH (ref 70–99)

## 2021-05-14 LAB — RESP PANEL BY RT-PCR (FLU A&B, COVID) ARPGX2
Influenza A by PCR: NEGATIVE
Influenza B by PCR: NEGATIVE
SARS Coronavirus 2 by RT PCR: NEGATIVE

## 2021-05-14 LAB — APTT: aPTT: 33 seconds (ref 24–36)

## 2021-05-14 MED ORDER — STROKE: EARLY STAGES OF RECOVERY BOOK: Freq: Once | Status: AC

## 2021-05-14 MED ORDER — ACETAMINOPHEN 650 MG RE SUPP: 650.0000 mg | RECTAL | Status: DC | PRN

## 2021-05-14 MED ORDER — ASPIRIN 81 MG PO CHEW
324.0000 mg | CHEWABLE_TABLET | Freq: Once | ORAL | Status: AC
  Administered 2021-05-14: 324 mg via ORAL
  Filled 2021-05-14: qty 4

## 2021-05-14 MED ORDER — SODIUM CHLORIDE 0.9 % IV SOLN: INTRAVENOUS | Status: DC

## 2021-05-14 MED ORDER — ATORVASTATIN CALCIUM 20 MG PO TABS
80.0000 mg | ORAL_TABLET | Freq: Every day | ORAL | Status: DC
Start: 2021-05-14 — End: 2021-05-16
  Administered 2021-05-15 (×2): 80 mg via ORAL
  Filled 2021-05-14 (×2): qty 4

## 2021-05-14 MED ORDER — VITAMIN D 25 MCG (1000 UNIT) PO TABS
5000.0000 [IU] | ORAL_TABLET | Freq: Every day | ORAL | Status: DC
  Administered 2021-05-15 – 2021-05-16 (×2): 5000 [IU] via ORAL
  Filled 2021-05-14 (×2): qty 5

## 2021-05-14 MED ORDER — ONDANSETRON HCL 4 MG PO TABS: 8.0000 mg | ORAL_TABLET | Freq: Three times a day (TID) | ORAL | Status: DC | PRN

## 2021-05-14 MED ORDER — MIRTAZAPINE 15 MG PO TABS
7.5000 mg | ORAL_TABLET | Freq: Every day | ORAL | Status: DC
  Administered 2021-05-15 (×2): 7.5 mg via ORAL
  Filled 2021-05-14 (×2): qty 1

## 2021-05-14 MED ORDER — GABAPENTIN 300 MG PO CAPS
600.0000 mg | ORAL_CAPSULE | Freq: Three times a day (TID) | ORAL | Status: DC
Start: 2021-05-14 — End: 2021-05-16
  Administered 2021-05-15 – 2021-05-16 (×6): 600 mg via ORAL
  Filled 2021-05-14 (×6): qty 2

## 2021-05-14 MED ORDER — CLOPIDOGREL BISULFATE 75 MG PO TABS
75.0000 mg | ORAL_TABLET | Freq: Every day | ORAL | Status: DC
  Administered 2021-05-16: 75 mg via ORAL
  Filled 2021-05-14 (×2): qty 1

## 2021-05-14 MED ORDER — DULOXETINE HCL 30 MG PO CPEP
30.0000 mg | ORAL_CAPSULE | Freq: Every day | ORAL | Status: DC
  Administered 2021-05-15 – 2021-05-16 (×2): 30 mg via ORAL
  Filled 2021-05-14 (×2): qty 1

## 2021-05-14 MED ORDER — ACETAMINOPHEN 325 MG PO TABS: 650.0000 mg | ORAL_TABLET | ORAL | Status: DC | PRN

## 2021-05-14 MED ORDER — ALBUTEROL SULFATE (2.5 MG/3ML) 0.083% IN NEBU: 1.5000 mL | INHALATION_SOLUTION | Freq: Four times a day (QID) | RESPIRATORY_TRACT | Status: DC | PRN

## 2021-05-14 MED ORDER — ALBUTEROL SULFATE HFA 108 (90 BASE) MCG/ACT IN AERS: 2.0000 | INHALATION_SPRAY | Freq: Four times a day (QID) | RESPIRATORY_TRACT | Status: DC | PRN

## 2021-05-14 MED ORDER — ASPIRIN 325 MG PO TABS
325.0000 mg | ORAL_TABLET | Freq: Every day | ORAL | Status: DC
  Filled 2021-05-14 (×2): qty 1

## 2021-05-14 MED ORDER — INSULIN ASPART 100 UNIT/ML IJ SOLN: 0.0000 [IU] | Freq: Four times a day (QID) | INTRAMUSCULAR | Status: DC | PRN

## 2021-05-14 MED ORDER — ACETAMINOPHEN 160 MG/5ML PO SOLN
650.0000 mg | ORAL | Status: DC | PRN
  Filled 2021-05-14: qty 20.3

## 2021-05-14 MED ORDER — ASPIRIN EC 81 MG PO TBEC
81.0000 mg | DELAYED_RELEASE_TABLET | Freq: Every day | ORAL | Status: DC
  Administered 2021-05-15 – 2021-05-16 (×2): 81 mg via ORAL
  Filled 2021-05-14 (×2): qty 1

## 2021-05-14 MED ORDER — HEPARIN SODIUM (PORCINE) 5000 UNIT/ML IJ SOLN
5000.0000 [IU] | Freq: Three times a day (TID) | INTRAMUSCULAR | Status: DC
  Administered 2021-05-15 – 2021-05-16 (×4): 5000 [IU] via SUBCUTANEOUS
  Filled 2021-05-14 (×4): qty 1

## 2021-05-14 MED ORDER — ASPIRIN 300 MG RE SUPP: 300.0000 mg | Freq: Every day | RECTAL | Status: DC

## 2021-05-14 MED ORDER — ACETAMINOPHEN 325 MG PO TABS: 650.0000 mg | ORAL_TABLET | Freq: Four times a day (QID) | ORAL | Status: DC | PRN

## 2021-05-14 MED ORDER — INSULIN ASPART 100 UNIT/ML IJ SOLN: 0.0000 [IU] | Freq: Every day | INTRAMUSCULAR | Status: DC

## 2021-05-14 NOTE — Progress Notes (Signed)
1405: Pt reports that she woke up this morning with numbness and weakness in left arm and leg. Bilateral hand grips and lower extremities appear equal in strength, but pt reports a noticeable difference when standing or holding objects. Pt denies any other symptoms such as but not limited to difficulty swallowing or changes in speech. Pt noted to be eating and drinking without difficulty. Per Dr. Cathie Hoops and Leatrice Jewels NP hold Venofer and recommend pt to report to ER for evaluation for potential stroke.  ?Initially pt refused ER and verbalizes the want to proceed with "what I came here for".   ?Pt educated on the risks of not being evaluated and that Per Dr. Cathie Hoops, No Venofer at this time.  ?1428:After careful consideration and discussion with son "Loraine Leriche" pt agrees to report to ER.  ?1430: Pt transported to ER and checked in at registration desk.  ?

## 2021-05-14 NOTE — Assessment & Plan Note (Addendum)
-   Will hold home norvasc 5mg  since she's normotensive currently and per neurology it is imperative to avoid hypotension due to intracranial atherosclerosis. ? ?

## 2021-05-14 NOTE — ED Provider Triage Note (Signed)
Emergency Medicine Provider Triage Evaluation Note ? ?Judy Harvey , a 88 y.o. female  was evaluated in triage.  Pt complains of weakness of the left hand and leg since this morning. She had left leg pain 2 days ago that has since gone away. She had trouble getting dressed and holding onto her walker this morning.  ? ?Review of Systems  ?Positive: Weakness ?Negative: Headache, slurred speach ? ?Physical Exam  ?There were no vitals taken for this visit. ?Gen:   Awake, no distress   ?Resp:  Normal effort  ?MSK:   Moves extremities without difficulty  ?Other:  Tongue protrudes midline. No facial droop. Grip strength equal. Demonstrates active ROM of lower extremities. No pronator drift. ? ?Medical Decision Making  ?Medically screening exam initiated at 2:37 PM.  Appropriate orders placed.  Judy Harvey was informed that the remainder of the evaluation will be completed by another provider, this initial triage assessment does not replace that evaluation, and the importance of remaining in the ED until their evaluation is complete. ? ?  ?Chinita Pester, FNP ?05/14/21 1548 ? ?

## 2021-05-14 NOTE — Assessment & Plan Note (Addendum)
Severe visual impairment, accommodations should be made as much as possible.  ? ?

## 2021-05-14 NOTE — ED Notes (Signed)
Patient transported to MRI 

## 2021-05-14 NOTE — ED Triage Notes (Signed)
Patient to ER from Cancer center. Reports she woke up this morning with numbness/ tingling present to entire left side. Reports tinging in her left hand, reports hand feels cold. States she also has some weakness in her left leg. Uses a walker at baseline, states she had some difficulty holding onto it and walking with it. States yesterday she had some headache/ blurred vision.  ? ? ?

## 2021-05-14 NOTE — Assessment & Plan Note (Addendum)
CBGs have been at goal despite holding home metformin and lantus 50u qHS. She will be told not to take insulin unless her blood sugar begins to rise after discharge. Previously HbA1c 8.1%. ?

## 2021-05-14 NOTE — Assessment & Plan Note (Addendum)
Due to CVA, stable. ? ?

## 2021-05-14 NOTE — H&P (Signed)
History and Physical    Patient: Judy Harvey MCN:[id number] DOB: 07/31/1927 DOA: 05/14/2021 DOS: the patient was seen and examined on 05/14/2021 PCP: Mick Sell, MD  Patient coming from: Home  Chief Complaint:  Chief Complaint  Patient presents with   Numbness    HPI: Judy Harvey is a 88 y.o. female with medical history significant of cva/hypertension, diabetes mellitus type 2 brought to the emergency room with complaints of left-sided numbness that started today morning when she could not get out of bed.  Patient went to her follow-up appointment with Dr. Cathie Hoops and was referred here for stroke work-up.  Patient is visually impaired due to macular degeneration in both eyes.  Pt states her left hand was cold and they were concerned that she was having a stroke. In the emergency room patient was given Tylenol, aspirin.  Patient's Plavix was stopped for anticipated procedure.  Initial head CT was negative for any acute intracranial process.   Review of Systems: Review of Systems  Constitutional: Negative.   HENT: Negative.    Eyes: Negative.   Respiratory: Negative.    Cardiovascular: Negative.   Gastrointestinal: Negative.   Musculoskeletal: Negative.   Skin: Negative.   Neurological:  Positive for sensory change and weakness.  Endo/Heme/Allergies: Negative.   All other systems reviewed and are negative.  Past Medical History:  Diagnosis Date   Anemia    Depression    Diabetes mellitus without complication (HCC)    GERD (gastroesophageal reflux disease)    History of COVID-19    Hyperlipidemia    Hypertension    Legally blind    Macular degeneration, bilateral    Peripheral neuropathy    Post herpetic neuralgia    located in her back   Stroke [facility])    Past Surgical History:  Procedure Laterality Date   ABDOMINAL HYSTERECTOMY     partial   APPENDECTOMY     BREAST SURGERY     reduction   CAROTID PTA/STENT INTERVENTION Left 10/18/2017   Procedure:  CAROTID PTA/STENT INTERVENTION;  Surgeon: Annice Needy, MD;  Location: [facility] INVASIVE CV LAB;  Service: Cardiovascular;  Laterality: Left;   CATARACT EXTRACTION, BILATERAL     CESAREAN SECTION     x3   COLONOSCOPY     ESOPHAGEAL DILATION     Social History:  reports that she quit smoking about 15 years ago. Her smoking use included cigarettes. She has a 150.00 pack-year smoking history. She has never used smokeless tobacco. She reports that she does not drink alcohol and does not use drugs.  Allergies  Allergen Reactions   Adhesive [Tape] Other (See Comments)    "pulls my skin off" paper tape ok   Lyrica [Pregabalin]     Body spasms    Family History  Problem Relation Age of Onset   Healthy Mother    Diabetes Father    Alzheimer's disease Father    Multiple sclerosis Sister    Multiple sclerosis Brother     Prior to Admission medications   Medication Sig Start Date End Date Taking? Authorizing Provider  acetaminophen (TYLENOL) 325 MG tablet Take 650 mg by mouth every 6 (six) hours as needed for mild pain or fever.    [provider]  albuterol (ACCUNEB) 1.25 MG/3ML nebulizer solution Take 3 mLs by nebulization every 6 (six) hours as needed for wheezing or shortness of breath. 11/12/19   [provider]  albuterol (VENTOLIN HFA) 108 (90 Base) MCG/ACT inhaler Inhale 2  puffs into the lungs every 6 (six) hours as needed for shortness of breath or wheezing. 11/06/19   [provider]  amLODipine (NORVASC) 5 MG tablet Take 5 mg by mouth daily.    [provider]  aspirin EC 81 MG tablet Take 81 mg by mouth daily.    [provider]  atorvastatin (LIPITOR) 80 MG tablet TAKE 1 TABLET BY MOUTH EVERYDAY AT BEDTIME Patient taking differently: Take 80 mg by mouth at bedtime. 08/21/18   Duanne LimerickJones, Deanna C, MD  Cholecalciferol (VITAMIN D3) 5000 units CAPS Take 1 capsule (5,000 Units total) by mouth daily. 06/28/16   Plonk, Chrissie NoaWilliam, MD  clobetasol ointment  (TEMOVATE) 0.05 % Apply 1 application topically 2 (two) times daily.    [provider]  clopidogrel (PLAVIX) 75 MG tablet Take 1 tablet (75 mg total) by mouth daily. 08/21/18   Duanne LimerickJones, Deanna C, MD  clopidogrel (PLAVIX) 75 MG tablet Take 1 tablet by mouth daily. TAKE 1 TABLET BY MOUTH EVERY DAY 02/12/21   [provider]  DULoxetine (CYMBALTA) 30 MG capsule TAKE 1 CAPSULE BY MOUTH EVERY DAY Patient taking differently: Take 30 mg by mouth daily. 08/21/18   Duanne LimerickJones, Deanna C, MD  DULoxetine (CYMBALTA) 30 MG capsule Take 1 tablet by mouth daily. TAKE 1 CAPSULE BY MOUTH EVERY DAY 02/12/21   [provider]  ferrous sulfate 325 (65 FE) MG tablet Take 325 mg by mouth daily.    [provider]  gabapentin (NEURONTIN) 100 MG capsule Take 6 capsules (600 mg total) by mouth 3 (three) times daily. 03/19/21   Wouk, Wilfred CurtisNoah Bedford, MD  insulin glargine (LANTUS) 100 UNIT/ML injection Inject 0.4 mLs (40 Units total) into the skin daily. INJECT 50 UNITS INTO THE SKIN DAILY AT 10 PM. 03/19/21   Wouk, Wilfred CurtisNoah Bedford, MD  Insulin Syringe-Needle U-100 (B-D INS SYR ULTRAFINE .3CC/31G) 31G X 5/16" 0.3 ML MISC Use to administer insulin daily to control diabetes. 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 08/21/18   Duanne LimerickJones, Deanna C, MD  Melatonin 3 MG CAPS Take by mouth. Take 3 mg by mouth at bedtime 01/28/21   [provider]  metFORMIN (GLUCOPHAGE-XR) 500 MG 24 hr tablet Take 500 mg by mouth 2 (two) times daily. 03/23/21   [provider]  mirtazapine (REMERON) 7.5 MG tablet Take 7.5 mg by mouth at bedtime. 02/25/21   [provider]  mirtazapine (REMERON) 7.5 MG tablet Take by mouth. Take 1 tablet (7.5 mg total) by mouth at bedtime 02/25/21 02/25/22  [provider]  nystatin (MYCOSTATIN/NYSTOP) powder Apply topically 2 (two) times daily. 01/29/21   [provider]  nystatin (MYCOSTATIN/NYSTOP) powder Apply topically 2 (two) times  daily. Apply topically 2 (two) times daily 01/29/21 01/29/22  [provider]  ondansetron (ZOFRAN) 8 MG tablet Take 8 mg by mouth every 8 (eight) hours as needed for nausea or vomiting.     [provider]  pantoprazole (PROTONIX) 40 MG tablet Take 40 mg by mouth 2 (two) times daily. 02/23/21   [provider]  pantoprazole (PROTONIX) 40 MG tablet Take by mouth. Take 1 tablet (40 mg total) by mouth 2 (two) times daily before meals 02/23/21 02/23/22  [provider]    Physical Exam: Vitals:   05/14/21 1440 05/14/21 1443 05/14/21 1700 05/14/21 2000  BP: 103/83  100/78   Pulse: 71  70   Resp: 19  17   Temp: 98.1 F (36.7 C)  TempSrc: Oral     SpO2: 93%  93%   Weight:    73 kg  Height:  5\' 2"  (1.575 m)  5\' 2"  (1.575 m)  Physical Exam Vitals and nursing note reviewed.  Constitutional:      General: She is not in acute distress.    Appearance: Normal appearance. She is not ill-appearing, toxic-appearing or diaphoretic.  HENT:     Head: Normocephalic and atraumatic.     Right Ear: Hearing and external ear normal.     Left Ear: Hearing and external ear normal.     Nose: Nose normal. No nasal deformity.     Mouth/Throat:     Lips: Pink.     Mouth: Mucous membranes are moist.     Tongue: No lesions.     Pharynx: Oropharynx is clear.  Eyes:     Extraocular Movements: Extraocular movements intact.     Pupils: Pupils are equal, round, and reactive to light.  Neck:     Vascular: No carotid bruit.  Cardiovascular:     Rate and Rhythm: Normal rate and regular rhythm.     Pulses: Normal pulses.     Heart sounds: Normal heart sounds.  Pulmonary:     Effort: Pulmonary effort is normal.     Breath sounds: Normal breath sounds.  Abdominal:     General: Bowel sounds are normal. There is no distension.     Palpations: Abdomen is soft. There is no mass.     Tenderness: There is no abdominal tenderness. There is no guarding.     Hernia: No hernia is  present.  Musculoskeletal:     Right lower leg: No edema.     Left lower leg: No edema.  Skin:    General: Skin is warm.  Neurological:     General: No focal deficit present.     Mental Status: She is alert and oriented to person, place, and time.     Cranial Nerves: Cranial nerves 2-12 are intact.     Motor: Motor function is intact.     Coordination: Coordination is intact. Finger-Nose-Finger Test normal.     Deep Tendon Reflexes:     Reflex Scores:      Bicep reflexes are 2+ on the right side and 2+ on the left side.      Patellar reflexes are 2+ on the right side and 2+ on the left side. Psychiatric:        Attention and Perception: Attention normal.        Mood and Affect: Mood normal.        Speech: Speech normal.        Behavior: Behavior normal. Behavior is cooperative.        Cognition and Memory: Cognition normal.     Data Reviewed: > CMP shows potassium of 3.3 replaced in ed/ glucose of 152 and creatinine of 1.02 > CBC shows anemia with hb of 8.3 which is chronic.  > Resp panel neg for flu and Covid.  > Initial head ct neg for any acute intracranial abnormality.  > EKG shows a regular rate rhythm 80 right bundle branch block, LVH, P waves not discernible due to artifact.   Assessment and Plan: * Left sided numbness Left sided numbness since today morning.  Pt was on DAPT but plavix was d/c for a procedure.  We will restart  FD asa 324 x 1 followed by 81 mg daily.  /Plavix 75 with DVT protocol.  Currently pt  is neurologically intact and back to baseline.  Will consider neuro consult.    Benign essential HTN Blood pressure 100/78, pulse 70, temperature 98.1 F (36.7 C), temperature source Oral, resp. rate 17, height 5\' 2"  (1.575 m), weight 73 kg, SpO2 93 %. hold all bp meds to allow for permissive htn .   Type 2 diabetes mellitus without complication, with long-term current use of insulin (HCC) Glycemic protocol.  SSI and accucheck.    Macular  degeneration Pt is visually impaired and is at risk for falls. Fall precaution.     Advance Care Planning:   Code Status: Full Code    Consults:  None   Family Communication:  Aniston, Christman (Son)  [phone] ([place])  Severity of Illness: The appropriate patient status for this patient is OBSERVATION. Observation status is judged to be reasonable and necessary in order to provide the required intensity of service to ensure the patient's safety. The patient's presenting symptoms, physical exam findings, and initial radiographic and laboratory data in the context of their medical condition is felt to place them at decreased risk for further clinical deterioration. Furthermore, it is anticipated that the patient will be medically stable for discharge from the hospital within 2 midnights of admission.   Author: [phone], MD 05/14/2021 11:23 PM  For on call review [email or web address].

## 2021-05-14 NOTE — Telephone Encounter (Signed)
415 pm.  Received information from [name], NP that patient discharged from [facility] and referred to [facility].  NP has requested follow up call to [facility] regarding services as patient would benefit from nursing, PT/OT/aide.  Phone call made to [facility] and they have not seen patient under their agency.   ? ?Patient currently in the [facility] ED.  [name], NP and [facility] Liaison team notified of patient location.  ?

## 2021-05-14 NOTE — ED Provider Notes (Signed)
? ?[facility] ?Provider Note ? ? ? Event Date/Time  ? First MD Initiated Contact with Patient 05/14/21 1957   ?  (approximate) ? ? ?History  ? ?Numbness ? ? ?HPI ? ?Judy Harvey is a 88 y.o. female with a history of diabetes, hypertension, prior stroke who comes to the ED complaining of numbness on the left side of the body.  She reports she was in her usual state of health last night at bedtime, but then woke up with the left-sided numbness.  She also reports she was initially having weakness this morning and difficulty getting out of bed, but that has improved.  She also had some headache last night which has resolved.  Denies any chest pain or shortness of breath or trauma. ?  ? ? ?Physical Exam  ? ?Triage Vital Signs: ?ED Triage Vitals  ?Enc Vitals Group  ?   BP 05/14/21 1440 103/83  ?   Pulse Rate 05/14/21 1440 71  ?   Resp 05/14/21 1440 19  ?   Temp 05/14/21 1440 98.1 ?F (36.7 ?C)  ?   Temp Source 05/14/21 1440 Oral  ?   SpO2 05/14/21 1440 93 %  ?   Weight 05/14/21 2000 161 lb (73 kg)  ?   Height 05/14/21 1443 5\' 2"  (1.575 m)  ?   Head Circumference --   ?   [facility] Flow --   ?   Pain Score 05/14/21 1443 0  ?   Pain Loc --   ?   Pain Edu? --   ?   Excl. in GC? --   ? ? ?Most recent vital signs: ?Vitals:  ? 05/14/21 1440 05/14/21 1700  ?BP: 103/83 100/78  ?Pulse: 71 70  ?Resp: 19 17  ?Temp: 98.1 ?F (36.7 ?C)   ?SpO2: 93% 93%  ? ? ? ?General: Awake, no distress.  ?CV:  Good peripheral perfusion.  Regular rate and rhythm ?Resp:  Normal effort.  Clear to auscultation bilaterally ?Abd:  No distention.  Soft and nontender ?Other:  Persistent relative sensory deficit on the left extremities compared to right on exam.  No motor drift.  Cranial nerves III through XII intact.  No nystagmus.  PERRL. ?NIH stroke scale 2 for upper extremity paresthesia and lower extremity paresthesia. ? ? ?ED Results / Procedures / Treatments  ? ?Labs ?(all labs ordered are listed, but only abnormal results are  displayed) ?Labs Reviewed  ?CBC - Abnormal; Notable for the following components:  ?    Result Value  ? RBC 3.29 (*)   ? Hemoglobin 8.3 (*)   ? HCT 29.5 (*)   ? MCH 25.2 (*)   ? MCHC 28.1 (*)   ? RDW 22.7 (*)   ? All other components within normal limits  ?DIFFERENTIAL - Abnormal; Notable for the following components:  ? Neutro Abs 8.0 (*)   ? All other components within normal limits  ?COMPREHENSIVE METABOLIC PANEL - Abnormal; Notable for the following components:  ? Potassium 3.3 (*)   ? Glucose, Bld 152 (*)   ? BUN 25 (*)   ? Creatinine, Ser 1.02 (*)   ? Calcium 8.5 (*)   ? Total Protein 6.4 (*)   ? Albumin 3.0 (*)   ? AST 12 (*)   ? GFR, Estimated 52 (*)   ? All other components within normal limits  ?CBG MONITORING, ED - Abnormal; Notable for the following components:  ? Glucose-Capillary 116 (*)   ? All  other components within normal limits  ?RESP PANEL BY RT-PCR (FLU A&B, COVID) [id number]  ?PROTIME-INR  ?APTT  ?RAPID URINE DRUG SCREEN, HOSP PERFORMED  ?HEMOGLOBIN A1C  ?LIPID PANEL  ? ? ? ?EKG ? ?Interpreted by me ?Sinus rhythm, rate of 80.  Left axis, first-degree AV block.  Right bundle branch block.  No acute ischemic changes. ? ? ?RADIOLOGY ?CT head viewed and interpreted by me, negative for obvious infarct or intracranial hemorrhage.  Radiology report reviewed. ? ? ? ?PROCEDURES: ? ?Critical Care performed: No ? ?Procedures ? ? ?MEDICATIONS ORDERED IN ED: ?Medications  ? stroke: mapping our early stages of recovery book (has no administration in time range)  ?0.9 %  sodium chloride infusion (has no administration in time range)  ?acetaminophen (TYLENOL) tablet 650 mg (has no administration in time range)  ?  Or  ?acetaminophen (TYLENOL) 160 MG/5ML solution 650 mg (has no administration in time range)  ?  Or  ?acetaminophen (TYLENOL) suppository 650 mg (has no administration in time range)  ?aspirin suppository 300 mg ( Rectal See Alternative 05/14/21 2118)  ?  Or  ?aspirin tablet 325 mg (325 mg Oral Not Given  05/14/21 2118)  ?heparin injection 5,000 Units (has no administration in time range)  ?insulin aspart (novoLOG) injection 0-9 Units (has no administration in time range)  ?insulin aspart (novoLOG) injection 0-5 Units (has no administration in time range)  ?aspirin chewable tablet 324 mg (324 mg Oral Given 05/14/21 2116)  ? ? ? ?IMPRESSION / MDM / ASSESSMENT AND PLAN / ED COURSE  ?I reviewed the triage vital signs and the nursing notes. ?             ?               ? ?Differential diagnosis includes, but is not limited to, intracranial hemorrhage, ischemic stroke, TIA, anemia, electrolyte abnormality ? ?Patient presents with neuro symptoms concerning for stroke.  CT head is unremarkable.  Vital signs and exam are reassuring.  Stroke scale is 2 for paresthesias only.  Due to her age and comorbidities, I recommended hospitalization for further work-up and risk factor modification which patient agrees.  Aspirin ordered.  Case discussed with hospitalist. ? ? ?  ? ? ?FINAL CLINICAL IMPRESSION(S) / ED DIAGNOSES  ? ?Final diagnoses:  ?TIA (transient ischemic attack)  ?Type 2 diabetes mellitus without complication, with long-term current use of insulin ([place])  ? ? ? ?Rx / DC Orders  ? ?ED Discharge Orders   ? ? None  ? ?  ? ? ? ?Note:  This document was prepared using Dragon voice recognition software and may include unintentional dictation errors. ?  ?Carrie Mew, MD ?05/14/21 2308 ? ?

## 2021-05-15 ENCOUNTER — Observation Stay: Payer: Medicare Other

## 2021-05-15 ENCOUNTER — Observation Stay (HOSPITAL_BASED_OUTPATIENT_CLINIC_OR_DEPARTMENT_OTHER)
Admit: 2021-05-15 | Discharge: 2021-05-15 | Disposition: A | Discharge status: Home / Self Care | Payer: Medicare Other | Attending: Internal Medicine | Admitting: Internal Medicine

## 2021-05-15 DIAGNOSIS — I6389 Other cerebral infarction: Secondary | ICD-10-CM | POA: Diagnosis not present

## 2021-05-15 DIAGNOSIS — J9601 Acute respiratory failure with hypoxia: Secondary | ICD-10-CM

## 2021-05-15 DIAGNOSIS — H548 Legal blindness, as defined in USA: Secondary | ICD-10-CM

## 2021-05-15 DIAGNOSIS — R200 Anesthesia of skin: Secondary | ICD-10-CM | POA: Diagnosis not present

## 2021-05-15 DIAGNOSIS — E782 Mixed hyperlipidemia: Secondary | ICD-10-CM

## 2021-05-15 DIAGNOSIS — H3530 Unspecified macular degeneration: Secondary | ICD-10-CM

## 2021-05-15 DIAGNOSIS — D509 Iron deficiency anemia, unspecified: Secondary | ICD-10-CM

## 2021-05-15 DIAGNOSIS — I639 Cerebral infarction, unspecified: Secondary | ICD-10-CM | POA: Diagnosis not present

## 2021-05-15 DIAGNOSIS — E119 Type 2 diabetes mellitus without complications: Secondary | ICD-10-CM

## 2021-05-15 DIAGNOSIS — I10 Essential (primary) hypertension: Secondary | ICD-10-CM

## 2021-05-15 DIAGNOSIS — Z794 Long term (current) use of insulin: Secondary | ICD-10-CM

## 2021-05-15 LAB — ECHOCARDIOGRAM COMPLETE
AR max vel: 1.41 cm2
AV Area VTI: 1.51 cm2
AV Area mean vel: 1.57 cm2
AV Mean grad: 9.5 mmHg
AV Peak grad: 21.4 mmHg
Ao pk vel: 2.32 m/s
Area-P 1/2: 3.34 cm2
Height: 62 in
S' Lateral: 3.68 cm
Weight: 2576 oz

## 2021-05-15 LAB — GLUCOSE, CAPILLARY
Glucose-Capillary: 143 mg/dL — ABNORMAL HIGH (ref 70–99)
Glucose-Capillary: 153 mg/dL — ABNORMAL HIGH (ref 70–99)

## 2021-05-15 LAB — RETICULOCYTES
Immature Retic Fract: 31.4 % — ABNORMAL HIGH (ref 2.3–15.9)
RBC.: 3.27 MIL/uL — ABNORMAL LOW (ref 3.87–5.11)
Retic Count, Absolute: 132.8 10*3/uL (ref 19.0–186.0)
Retic Ct Pct: 4.1 % — ABNORMAL HIGH (ref 0.4–3.1)

## 2021-05-15 LAB — FERRITIN: Ferritin: 57 ng/mL (ref 11–307)

## 2021-05-15 LAB — LIPID PANEL
Cholesterol: 143 mg/dL (ref 0–200)
HDL: 36 mg/dL — ABNORMAL LOW (ref >40)
LDL Cholesterol: 92 mg/dL (ref 0–99)
Total CHOL/HDL Ratio: 4 RATIO
Triglycerides: 73 mg/dL (ref <150)
VLDL: 15 mg/dL (ref 0–40)

## 2021-05-15 LAB — FOLATE: Folate: 16.2 ng/mL (ref >5.9)

## 2021-05-15 LAB — VITAMIN B12: Vitamin B-12: 1149 pg/mL — ABNORMAL HIGH (ref 180–914)

## 2021-05-15 LAB — IRON AND TIBC
Iron: 46 ug/dL (ref 28–170)
Saturation Ratios: 13 % (ref 10.4–31.8)
TIBC: 343 ug/dL (ref 250–450)
UIBC: 297 ug/dL

## 2021-05-15 LAB — CBG MONITORING, ED: Glucose-Capillary: 143 mg/dL — ABNORMAL HIGH (ref 70–99)

## 2021-05-15 MED ORDER — IOHEXOL 350 MG/ML SOLN
75.0000 mL | Freq: Once | INTRAVENOUS | Status: AC | PRN
  Administered 2021-05-15: 75 mL via INTRAVENOUS
  Filled 2021-05-15: qty 75

## 2021-05-15 MED ORDER — IPRATROPIUM-ALBUTEROL 0.5-2.5 (3) MG/3ML IN SOLN
3.0000 mL | Freq: Once | RESPIRATORY_TRACT | Status: AC
  Administered 2021-05-15: 3 mL via RESPIRATORY_TRACT
  Filled 2021-05-15: qty 3

## 2021-05-15 MED ORDER — POTASSIUM CHLORIDE CRYS ER 20 MEQ PO TBCR
20.0000 meq | EXTENDED_RELEASE_TABLET | Freq: Once | ORAL | Status: AC
Start: 2021-05-15 — End: 2021-05-15
  Administered 2021-05-15: 20 meq via ORAL
  Filled 2021-05-15: qty 1

## 2021-05-15 MED ORDER — ALBUTEROL SULFATE (2.5 MG/3ML) 0.083% IN NEBU: 2.5000 mg | INHALATION_SOLUTION | Freq: Four times a day (QID) | RESPIRATORY_TRACT | Status: DC | PRN

## 2021-05-15 NOTE — Progress Notes (Signed)
*  PRELIMINARY RESULTS* ?Echocardiogram ?2D Echocardiogram has been performed. ? ?Judy Harvey ?05/15/2021, 12:58 PM ?

## 2021-05-15 NOTE — Evaluation (Signed)
Speech Language Pathology Evaluation ?Patient Details ?Name: Judy Harvey ?MRN: [id number] ?DOB: 01-24-1928 ?Today's Date: 05/15/2021 ?Time: [id number] ?SLP Time Calculation (min) (ACUTE ONLY): 13 min ? ?Problem List:  ?Patient Active Problem List  ? Diagnosis Date Noted  ? Acute CVA (cerebrovascular accident) ([place]) 05/15/2021  ? Left sided numbness 05/14/2021  ? ARF (acute renal failure) ([place]) 03/12/2021  ? Hypotension due to hypovolemia   ? Acute respiratory failure with hypoxia ([place])   ? Community acquired pneumonia 10/25/2020  ? Major depressive 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 ([place]) 08/21/2018  ? Low vitamin B12 level 04/26/2018  ? Iron deficiency anemia 03/28/2018  ? Frequent PVCs 03/21/2018  ? PSVT (paroxysmal supraventricular tachycardia) ([place]) 03/21/2018  ? Normocytic anemia 02/28/2018  ? Benign essential HTN 02/20/2018  ? SOBOE (shortness of breath on exertion) 02/20/2018  ? Carotid stenosis, symptomatic w/o infarct, left 10/18/2017  ? TIA (transient ischemic attack) 06/16/2017  ? Bilateral carotid artery stenosis 06/16/2017  ? Hyperlipidemia 06/16/2017  ? Obesity (BMI 30.0-34.9) 05/02/2016  ? Vitamin D deficiency 03/25/2016  ? Type 2 diabetes mellitus without complication, with long-term current use of insulin ([place]) 03/24/2016  ? Old cerebrovascular accident (CVA) without late effect 03/24/2016  ? Legally blind 03/24/2016  ? Macular degeneration 03/24/2016  ? Constipation 03/24/2016  ? History of esophageal stricture 03/24/2016  ? Depression with anxiety 03/24/2016  ? Restless leg syndrome 03/24/2016  ? Postherpetic neuralgia 03/24/2016  ? Gait abnormality 03/24/2016  ? ?Past Medical History:  ?Past Medical History:  ?Diagnosis Date  ? Anemia   ? Depression   ? Diabetes mellitus without complication ([place])   ? GERD (gastroesophageal reflux disease)   ? History of COVID-19   ? Hyperlipidemia   ? Hypertension   ? Legally blind   ? Macular degeneration, bilateral   ? Peripheral neuropathy   ? Post herpetic  neuralgia   ? located in her back  ? Stroke [facility])   ? ?Past Surgical History:  ?Past Surgical History:  ?Procedure Laterality Date  ? ABDOMINAL HYSTERECTOMY    ? partial  ? APPENDECTOMY    ? BREAST SURGERY    ? reduction  ? CAROTID PTA/STENT INTERVENTION Left 10/18/2017  ? Procedure: CAROTID PTA/STENT INTERVENTION;  Surgeon: Algernon Huxley, MD;  Location: [facility] CV LAB;  Service: Cardiovascular;  Laterality: Left;  ? CATARACT EXTRACTION, BILATERAL    ? CESAREAN SECTION    ? x3  ? COLONOSCOPY    ? ESOPHAGEAL DILATION    ? ?HPI:  ?Judy Harvey is a 88 y.o. female with a history of diabetes, hypertension, prior stroke who comes to the ED complaining of numbness on the left side of the body.  MRI revealed small, subcentimeter focus of acute/subacute ischemia in the right parietal white matter. No hemorrhage or mass effect. 2. Occlusion of the right ICA. 3. Likely high grade stenosis of the right ACA distal A1 segment and  anterior communicating artery. This could be confirmed with CTA if clinically indicated.  ? ?Assessment / Plan / Recommendation ?Clinical Impression ? Pt is a very pleasant patient who is eager to engage. Pt sleeping in recliner but is easliy aroused. Pt is oriented x 4, follows directions well, demonstrates functional attention to task and recall previous therapies that have been by. She is able to verbalize current concern and reason for hospitalization. At this time, pt doesn't appear to be exhibiting any acute cognitive communication deficits. ST will sign off. ?   ?SLP Assessment ? SLP Recommendation/Assessment: Patient  does not need any further Speech Lanaguage Pathology Services ?SLP Visit Diagnosis: Cognitive communication deficit (R41.841)  ?  ?Recommendations for follow up therapy are one component of a multi-disciplinary discharge planning process, led by the attending physician.  Recommendations may be updated based on patient status, additional functional criteria and insurance  authorization. ?   ?   ?   ?   ?   ?   ?SLP Evaluation ?Cognition ? Overall Cognitive Status: Within Functional Limits for tasks assessed ?Arousal/Alertness: Awake/alert ?Orientation Level: Oriented X4  ?  ?   ?Comprehension ? Auditory Comprehension ?Overall Auditory Comprehension: Appears within functional limits for tasks assessed ?Visual Recognition/Discrimination ?Discrimination: Within Function Limits ?Reading Comprehension ?Reading Status: Not tested  ?  ?Expression Expression ?Primary Mode of Expression: Verbal ?Verbal Expression ?Overall Verbal Expression: Appears within functional limits for tasks assessed ?Written Expression ?Dominant Hand: Right ?Written Expression: Not tested   ?Oral / Motor ? Oral Motor/Sensory Function ?Overall Oral Motor/Sensory Function: Within functional limits ?Motor Speech ?Overall Motor Speech: Appears within functional limits for tasks assessed   ?        ?Wildon Cuevas B. Rutherford Nail, M.S., CCC-SLP, CBIS ?Speech-Language Pathologist ?Rehabilitation Services ?Office [phone] ? ?Chaney Ingram ?05/15/2021, 10:43 AM ? ?

## 2021-05-15 NOTE — Evaluation (Addendum)
Physical Therapy Evaluation ?Patient Details ?Name: Judy Harvey ?MRN: [id number] ?DOB: [birth year] ?Today's Date: 05/15/2021 ? ?History of Present Illness ? Pt is a 88 y/o F who presented with c/o L sided numbness. CT head is unremarkable. MRI reveals Small, subcentimeter focus of acute/subacute ischemia in the  right parietal white matter, No hemorrhage or mass effect, Occlusion of the R ICA, Likely high grade stenosis of the right ACA distal A1 segment and  anterior communicating artery. This could be confirmed with CTA if clinically indicated, and Chronic small vessel disease and volume loss. PMH: CVA, HTN, DM2, macular degeneration in B eyes/legally blind, HLD, peripheral neuroapathy  ?Clinical Impression ? Pt seen for PT evaluation with pt agreeable, reporting she's ambulatory with RW in her home where she lives with her son & now has a PCA 4 hrs/day for 4 days/week. Pt requires min assist for sit<>stand transfers & short distance gait with RW in room. Pt with urinary incontinence limiting session. Pt does demonstrate impaired balance during standing & requires assistance to correct LOB. Pt also presents with LUE weakness. Pt would benefit from ongoing acute PT services to progress endurance, LUE & overall strengthening, balance & gait with LRAD.  ? ?Pt's BP upon PT arrival 98/71 mmHg MAP 79, rechecked a few minutes later, 144/55 mmHg MAP 83 ?Pt on 1.5 L/min via nasal cannula throughout session with lowest SPO2 of 85% but pt able to recover to >/=90% with rest.    ?   ? ?Recommendations for follow up therapy are one component of a multi-disciplinary discharge planning process, led by the attending physician.  Recommendations may be updated based on patient status, additional functional criteria and insurance authorization. ? ?Follow Up Recommendations Home health PT ? ?  ?Assistance Recommended at Discharge Frequent or constant Supervision/Assistance  ?Patient can return home with the following ? A little help  with walking and/or transfers;A little help with bathing/dressing/bathroom;Assistance with cooking/housework;Assistance with feeding;Direct supervision/assist for medications management;Help with stairs or ramp for entrance;Direct supervision/assist for financial management;Assist for transportation ? ?  ?Equipment Recommendations Rolling walker (2 wheels)  ?Recommendations for Other Services ?    ?  ?Functional Status Assessment Patient has had a recent decline in their functional status and demonstrates the ability to make significant improvements in function in a reasonable and predictable amount of time.  ? ?  ?Precautions / Restrictions Precautions ?Precautions: Fall ?Precaution Comments: legally blind, HOH ?Restrictions ?Weight Bearing Restrictions: No  ? ?  ? ?Mobility ? Bed Mobility ?  ?  ?  ?  ?  ?  ?  ?General bed mobility comments: not observed, pt received & left sitting in recliner ?  ? ?Transfers ?Overall transfer level: Needs assistance ?Equipment used: Rolling walker (2 wheels) ?Transfers: Sit to/from Stand ?Sit to Stand: Min assist ?  ?  ?  ?  ?  ?  ?  ? ?Ambulation/Gait ?Ambulation/Gait assistance: Min assist ?Gait Distance (Feet): 15 Feet (+ 15 ft) ?Assistive device: Rolling walker (2 wheels) ?Gait Pattern/deviations: Decreased step length - right, Decreased dorsiflexion - left, Decreased step length - left ?Gait velocity: decreased ?  ?  ?General Gait Details: decreased foot clearance LLE ? ?Stairs ?  ?  ?  ?  ?  ? ?Wheelchair Mobility ?  ? ?Modified Rankin (Stroke Patients Only) ?  ? ?  ? ?Balance Overall balance assessment: Needs assistance ?Sitting-balance support: Feet unsupported ?Sitting balance-Leahy Scale: Good ?  ?  ?Standing balance support: Bilateral upper extremity supported, During functional  activity ?Standing balance-Leahy Scale: Poor ?Standing balance comment: 1 LOB when turning with UE support on RW with min/mod assist to correct, min assist for gait with RW ?  ?  ?  ?  ?  ?  ?   ?  ?  ?  ?  ?   ? ? ? ?Pertinent Vitals/Pain Pain Assessment ?Pain Assessment: No/denies pain  ? ? ?Home Living Family/patient expects to be discharged to:: Private residence ?Living Arrangements: Children ?Available Help at Discharge: Family;Personal care attendant (PCA 1-5pm 4 days/week) ?Type of Home: House ?Home Access: Stairs to enter ?Entrance Stairs-Rails: None ?Entrance Stairs-Number of Steps: 2 ?  ?Home Layout: Two level;Able to live on main level with bedroom/bathroom ?Home Equipment: Conservation officer, nature (2 wheels) ?Additional Comments: Pt reports she's ambulatory with RW but does endorse 3 falls in the past 6 months.  ?  ?Prior Function Prior Level of Function : Independent/Modified Independent;Needs assist ?  ?  ?  ?  ?  ?  ?Mobility Comments: Ambulatory with RW ?  ?  ? ? ?Hand Dominance  ? Dominant Hand: Right ? ?  ?Extremity/Trunk Assessment  ? Upper Extremity Assessment ?Upper Extremity Assessment: Defer to OT evaluation (LUE weakness as pt unable to hold cell phone (she drops it)) ?  ? ?Lower Extremity Assessment ?Lower Extremity Assessment: Generalized weakness ?RLE Deficits / Details: BLE heel to shin grossly equal ?  ? ?Cervical / Trunk Assessment ?Cervical / Trunk Assessment:  (rounded shoulders)  ?Communication  ? Communication: HOH  ?Cognition Arousal/Alertness: Awake/alert ?Behavior During Therapy: Impulsive, WFL for tasks assessed/performed ?Overall Cognitive Status: Within Functional Limits for tasks assessed ?  ?  ?  ?  ?  ?  ?  ?  ?  ?  ?  ?  ?  ?  ?  ?  ?General Comments: slightly impulsive with mobility tasks ?  ?  ? ?  ?General Comments General comments (skin integrity, edema, etc.): Pt with incontinent void upon standing & continent void while on toilet; pt performs peri hygiene without assistance but requires assistance to don clean brief ? ?  ?Exercises    ? ?Assessment/Plan  ?  ?PT Assessment Patient needs continued PT services  ?PT Problem List Decreased strength;Decreased  coordination;Cardiopulmonary status limiting activity;Decreased activity tolerance;Impaired sensation;Decreased balance;Decreased safety awareness;Decreased mobility;Decreased knowledge of use of DME ? ?   ?  ?PT Treatment Interventions DME instruction;Therapeutic exercise;Gait training;Balance training;Stair training;Neuromuscular re-education;Modalities;Manual techniques;Functional mobility training;Therapeutic activities;Patient/family education;Cognitive remediation   ? ?PT Goals (Current goals can be found in the Care Plan section)  ?Acute Rehab PT Goals ?Patient Stated Goal: get better ?PT Goal Formulation: With patient ?Time For Goal Achievement: 05/29/21 ?Potential to Achieve Goals: Good ? ?  ?Frequency 7X/week ?  ? ? ?Co-evaluation   ?  ?  ?  ?  ? ? ?  ?AM-PAC PT "6 Clicks" Mobility  ?Outcome Measure Help needed turning from your back to your side while in a flat bed without using bedrails?: A Little ?Help needed moving from lying on your back to sitting on the side of a flat bed without using bedrails?: A Little ?Help needed moving to and from a bed to a chair (including a wheelchair)?: A Little ?Help needed standing up from a chair using your arms (e.g., wheelchair or bedside chair)?: A Little ?Help needed to walk in hospital room?: A Little ?Help needed climbing 3-5 steps with a railing? : A Lot ?6 Click Score: 17 ? ?  ?End of Session   ?  Activity Tolerance: Patient tolerated treatment well ?Patient left: in chair;with call bell/phone within reach ?Nurse Communication: Mobility status (O2) ?PT Visit Diagnosis: Unsteadiness on feet (R26.81);Muscle weakness (generalized) (M62.81);Difficulty in walking, not elsewhere classified (R26.2);History of falling (Z91.81);Hemiplegia and hemiparesis ?Hemiplegia - Right/Left: Left ?Hemiplegia - dominant/non-dominant: Non-dominant ?Hemiplegia - caused by: Cerebral infarction ?  ? ?Time: [phone] ?PT Time Calculation (min) (ACUTE ONLY): 30 min ? ? ?Charges:   PT  Evaluation ?$PT Eval Moderate Complexity: 1 Mod ?PT Treatments ?$Therapeutic Activity: 8-22 mins ?  ?   ? ? ?Lavone Nian, PT, DPT ?05/15/21, 2:54 PM ? ? ?Judy Harvey ?05/15/2021, 2:54 PM ? ?

## 2021-05-15 NOTE — Progress Notes (Signed)
Progress Note  Patient: Judy Harvey [id number] DOB: 01-11-1928  DOA: 05/14/2021  DOS: 05/15/2021    Brief hospital course: Judy Harvey is a 88 y.o. 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 After negative CT, an MRI of the brain revealed an acute subcentimeter right parietal infarct. MRA showed right ICA occlusion and high-grade stenosis of right ACA A1 segment and anterior communicating artery. Subsequent CTA head and neck confirmed right ICA stenosis, left ICA stent, though it refuted the stenosis suggested in the right A1 segment and anterior communicating artery.and additionally moderate/severe distal right M1 segment of the MCA. Echocardiogram revealed no cardioembolic source, and cardiac monitoring has yet to reveal significant dysrhythmia. Neurology was consulted, guiding work up, and recommends reinitiation of DAPT and high intensity statin with plans for neurology follow up.   On 3/4, the patient has been placed on oxygen and has mild wheezing. Further workup is underway, nebs ordered. In palliative care outpatient documentation, it is noted that she has an oxygen concentrator at home though she tells me she's never been on oxygen.  Assessment and Plan: * Acute CVA (cerebrovascular accident) ([place]) Acute right parietal white matter infarct. Additionally noted is right ICA occlusion and moderate-severe right distal M1 stenosis. No cardioembolic source or evidence of AFib at this time. - PT/OT/SLP consulted as pt still has left-sided numbness > will benefit from home health therapies - Continue aspirin 81mg  daily, plavix 75mg  daily, atorvastatin 80mg  daily per neurology recommendations, discussed with Dr. Quinn Axe.  Left sided numbness Due to CVA,  stable.   Benign essential HTN - Allow permissive HTN for now, per neurology it is imperative to avoid hypotension due to intracranial atherosclerosis.   Type 2 diabetes mellitus without complication, with long-term current use of insulin (HCC) Check CBGs, administer SSI, holding metformin and lantus for the time being as her CBGs are normal without any insulin required.   Macular degeneration Severe visual impairment, accommodations should be made as much as possible.    Acute respiratory failure with hypoxia (HCC) Pt denied that she'd ever needed oxygen before but she has concentrator at home per Palliative. At any rate, she doesn't wear oxygen at baseline. Currently 83% on room air, though not terribly short of breath. Suspect she may chronically be a bit hypoxemic.  - Continue supplemental oxygen to maintain SpO2 >88% with normal respiratory effort.  - Check CXR 2v - Give duoneb now and continue albuterol prn - Further work up to be based on clinical trajectory and [place] discussions.   Subjective: Seen early this morning, still with left sided numbness, abnormal sensation. Makes it hard to use the hand but isn't technically weak. Hasn't gotten up yet this AM. No new complaints. She denies dyspnea, had oxygen placed on her in the night.  Objective: Vitals:   05/15/21 1400 05/15/21 1440 05/15/21 1500 05/15/21 1601  BP: 112/68 (!) 142/70 137/60 (!) 151/73  Pulse: 70 85 77 76  Resp: (!) 22  18 18   Temp:    97.8 F (36.6 C)  TempSrc:    Oral  SpO2: (!) 84% 99% 100% 100%  Weight:      Height:       Gen: Elder, frail female in no distress Pulm: Nonlabored breathing supplemental oxygen, no wheezes or crackles  this morning. Some wheeze this PM CV: Regular rate and rhythm. No murmur, rub, or gallop. No JVD, no pitting dependent edema. GI: Abdomen soft, non-tender, non-distended, with normoactive bowel sounds.  Ext: Warm, no deformities Skin: No rashes, lesions or ulcers on  visualized skin. Neuro: Alert and oriented. Severely diminished visual acuity, left arm and leg with diffuse abnormal and impaired sensation, technically strength is 5/5 in tested muscle groups. No clonus.  Psych: Judgement and insight appear fair. Mood euthymic & affect congruent. Behavior is appropriate.    Data Personally reviewed:  CBC: Recent Labs  Lab 05/14/21 1450  WBC 10.0  NEUTROABS 8.0*  HGB 8.3*  HCT 29.5*  MCV 89.7  PLT [id number]   Basic Metabolic Panel: Recent Labs  Lab 05/14/21 1450  NA 139  K 3.3*  CL 102  CO2 29  GLUCOSE 152*  BUN 25*  CREATININE 1.02*  CALCIUM 8.5*   GFR: Estimated Creatinine Clearance: 32.9 mL/min (A) (by C-G formula based on SCr of 1.02 mg/dL (H)). Liver Function Tests: Recent Labs  Lab 05/14/21 1450  AST 12*  ALT 8  ALKPHOS 70  BILITOT 0.3  PROT 6.4*  ALBUMIN 3.0*   No results for input(s): LIPASE, AMYLASE in the last 168 hours. No results for input(s): AMMONIA in the last 168 hours. Coagulation Profile: Recent Labs  Lab 05/14/21 1450  INR 1.1   Cardiac Enzymes: No results for input(s): CKTOTAL, CKMB, CKMBINDEX, TROPONINI in the last 168 hours. BNP (last 3 results) No results for input(s): PROBNP in the last 8760 hours. HbA1C: No results for input(s): HGBA1C in the last 72 hours. CBG: Recent Labs  Lab 05/14/21 1449 05/15/21 0151 05/15/21 1604  GLUCAP 116* 143* 143*   Lipid Profile: Recent Labs    05/15/21 0734  CHOL 143  HDL 36*  LDLCALC 92  TRIG 73  CHOLHDL 4.0   Thyroid Function Tests: No results for input(s): TSH, T4TOTAL, FREET4, T3FREE, THYROIDAB in the last 72 hours. Anemia Panel: Recent Labs    05/14/21 2356 05/15/21 0141  VITAMINB12  --  1,149*  FOLATE 16.2  --   FERRITIN 57  --   TIBC 343  --   IRON 46  --   RETICCTPCT 4.1*  --    Urine analysis:    Component Value Date/Time   COLORURINE YELLOW (A) 03/13/2021 0946   APPEARANCEUR HAZY (A) 03/13/2021 0946   LABSPEC 1.015 03/13/2021 0946    PHURINE 5.0 03/13/2021 0946   GLUCOSEU NEGATIVE 03/13/2021 0946   HGBUR NEGATIVE 03/13/2021 0946   BILIRUBINUR NEGATIVE 03/13/2021 0946   BILIRUBINUR negative 02/19/2018 1557   KETONESUR NEGATIVE 03/13/2021 0946   PROTEINUR NEGATIVE 03/13/2021 0946   UROBILINOGEN 0.2 02/19/2018 1557   NITRITE NEGATIVE 03/13/2021 0946   LEUKOCYTESUR MODERATE (A) 03/13/2021 0946   Recent Results (from the past 240 hour(s))  Resp Panel by RT-PCR (Flu A&B, Covid) Nasopharyngeal Swab     Status: None   Collection Time: 05/14/21  9:17 PM   Specimen: Nasopharyngeal Swab; Nasopharyngeal(NP) swabs in vial transport medium  Result Value Ref Range Status   SARS Coronavirus 2 by RT PCR NEGATIVE NEGATIVE Final    Comment: (NOTE) SARS-CoV-2 target nucleic acids are NOT DETECTED.  The SARS-CoV-2 RNA is generally detectable in upper respiratory specimens during the acute phase of infection. The lowest concentration of SARS-CoV-2 viral copies this assay can detect is 138 copies/mL. A negative result does not preclude SARS-Cov-2 infection and should not be used as the sole basis for  treatment or other patient management decisions. A negative result may occur with  improper specimen collection/handling, submission of specimen other than nasopharyngeal swab, presence of viral mutation(s) within the areas targeted by this assay, and inadequate number of viral copies(<138 copies/mL). A negative result must be combined with clinical observations, patient history, and epidemiological information. The expected result is Negative.  Fact Sheet for Patients:  EntrepreneurPulse.com.au  Fact Sheet for Healthcare Providers:  IncredibleEmployment.be  This test is no t yet approved or cleared by the [place] FDA and  has been authorized for detection and/or diagnosis of SARS-CoV-2 by FDA under an Emergency Use Authorization (EUA). This EUA will remain  in effect (meaning this test  can be used) for the duration of the COVID-19 declaration under Section 564(b)(1) of the Act, 21 U.S.C.section 360bbb-3(b)(1), unless the authorization is terminated  or revoked sooner.       Influenza A by PCR NEGATIVE NEGATIVE Final   Influenza B by PCR NEGATIVE NEGATIVE Final    Comment: (NOTE) The Xpert Xpress SARS-CoV-2/FLU/RSV plus assay is intended as an aid in the diagnosis of influenza from Nasopharyngeal swab specimens and should not be used as a sole basis for treatment. Nasal washings and aspirates are unacceptable for Xpert Xpress SARS-CoV-2/FLU/RSV testing.  Fact Sheet for Patients: EntrepreneurPulse.com.au  Fact Sheet for Healthcare Providers: IncredibleEmployment.be  This test is not yet approved or cleared by the [place] FDA and has been authorized for detection and/or diagnosis of SARS-CoV-2 by FDA under an Emergency Use Authorization (EUA). This EUA will remain in effect (meaning this test can be used) for the duration of the COVID-19 declaration under Section 564(b)(1) of the Act, 21 U.S.C. section 360bbb-3(b)(1), unless the authorization is terminated or revoked.  Performed at [facility], [address] [place], [place] [address]      CT [facility] HEAD NECK W WO CM  Result Date: 05/15/2021 CLINICAL DATA:  Provided history: Neuro deficit, acute, stroke suspected. Further eval of ACA stenosis seen on MRA. Numbness. EXAM: CT ANGIOGRAPHY HEAD AND NECK TECHNIQUE: Multidetector CT imaging of the head and neck was performed using the standard protocol during bolus administration of intravenous contrast. Multiplanar CT image reconstructions and MIPs were obtained to evaluate the vascular anatomy. Carotid stenosis measurements (when applicable) are obtained utilizing NASCET criteria, using the distal internal carotid diameter as the denominator. RADIATION DOSE REDUCTION: This exam was performed according to the  departmental dose-optimization program which includes automated exposure control, adjustment of the mA and/or kV according to patient size and/or use of iterative reconstruction technique. CONTRAST:  60mL OMNIPAQUE IOHEXOL 350 MG/ML SOLN COMPARISON:  MRA head and MRI brain 05/14/2021. FINDINGS: CT HEAD FINDINGS Brain: Mild generalized cerebral and cerebellar atrophy. A small acute/early subacute infarct within the right parietal lobe was better appreciated on the brain MRI of 05/14/2021. Redemonstrated small chronic cortical/subcortical infarct within the right parietal lobe (for instance as seen on series 4, image 24). Background moderate/advanced patchy and ill-defined hypoattenuation within the cerebral white matter, nonspecific but compatible with chronic small vessel ischemic disease. Known chronic lacunar infarct within the left thalamus, better appreciated on the prior MRI. There is no acute intracranial hemorrhage. No extra-axial fluid collection. No evidence of an intracranial mass. No midline shift. Vascular: No hyperdense vessel.  Atherosclerotic calcifications. Skull: Normal. Negative for fracture or focal lesion. Sinuses: Partial opacification of a posterior right ethmoid air cell. Orbits: No orbital mass or acute orbital finding. Review of the MIP images confirms the above findings  CTA NECK FINDINGS Aortic arch: Standard aortic branching. Atherosclerotic plaque within the visualized aortic arch and proximal major branch vessels of the neck. No hemodynamically significant innominate or proximal subclavian artery stenosis. Right carotid system: The common carotid artery is patent within the neck. Atherosclerotic plaque about the carotid bifurcation. The right ICA is occluded beginning at its origin and remains occluded throughout the remainder of the neck. Left carotid system: CCA and ICA patent within the neck. A stent traverses the distal common carotid artery and proximal internal carotid artery.  Stent artifact limits evaluation for intra stent stenosis. However, the stent appears patent. No hemodynamically significant stenosis elsewhere within the left common carotid or internal carotid artery. Vertebral arteries: Vertebral arteries codominant and patent within the neck without hemodynamically significant stenosis. Mild atherosclerotic plaque within the left V1, left V2 and bilateral V3 vertebral arteries. Skeleton: Cervical spondylosis. No acute bony abnormality or aggressive osseous lesion. Cervical spondylosis. Other neck: No neck mass or cervical lymphadenopathy. Upper chest: No consolidation within the imaged lung apices. Atelectasis within the dependent aspect of the right upper lobe. Review of the MIP images confirms the above findings CTA HEAD FINDINGS Anterior circulation: The right internal carotid artery remains occluded intracranially. The M1 middle cerebral arteries are patent. There is an apparent moderate/severe stenosis within the distal M1 right MCA (series 13, image 50). Atherosclerotic irregularity of the M2 and more distal middle cerebral arteries. No M2 proximal branch occlusion is identified. The anterior cerebral arteries are patent. There is no appreciable stenosis within the A1 right ACA or anterior communicating artery, as was questioned on the recent prior MRA head. No intracranial aneurysm is identified. Posterior circulation: The intracranial vertebral arteries are patent. The basilar artery is patent. The posterior cerebral arteries are patent. Posterior communicating arteries are diminutive or absent bilaterally. Venous sinuses: Within the limitations of contrast timing, no convincing thrombus. Anatomic variants: As described. Review of the MIP images confirms the above findings IMPRESSION: CT head: 1. A small acute/early subacute infarct within the right parietal lobe was better appreciated on the prior brain MRI of 05/14/2021. 2. Redemonstrated small chronic  cortical/subcortical infarct within the right parietal lobe. 3. Background moderate/advanced chronic small vessel ischemic changes within the cerebral white matter. 4. Redemonstrated chronic lacunar infarct within the left thalamus. 5. Mild generalized parenchymal atrophy. 6. Right ethmoid sinusitis. CTA neck: 1. The right internal carotid artery is occluded beginning at its origin and remains occluded throughout the remainder of the neck. 2. A stent traverses the distal left common carotid artery and proximal left internal carotid artery. Stent artifact limits evaluation for intra-stent stenosis. However, the stent appears patent. No hemodynamically significant stenosis elsewhere within the left common or internal carotid arteries within the neck. 3. Vertebral arteries codominant and patent within the neck without hemodynamically significant stenosis. Mild atherosclerotic plaque within the left V1, left V2 and bilateral V3 vertebral arteries. CTA head: 1. The right internal carotid artery remains occluded intracranially. 2. Additional intracranial atherosclerotic disease, as described. Most notably, there is an apparent moderate/severe stenosis within the distal M1 segment of the right middle cerebral artery. 3. No appreciable stenosis within the right A1 segment or anterior communicating artery, as was questioned on the recent prior MRA head. Electronically Signed   By: Kellie Simmering D.O.   On: 05/15/2021 14:57   CT HEAD WO CONTRAST  Result Date: 05/14/2021 CLINICAL DATA:  Left-sided numbness and tingling since this morning, left leg weakness EXAM: CT HEAD WITHOUT CONTRAST TECHNIQUE: Contiguous axial  images were obtained from the base of the skull through the vertex without intravenous contrast. RADIATION DOSE REDUCTION: This exam was performed according to the departmental dose-optimization program which includes automated exposure control, adjustment of the mA and/or kV according to patient size and/or use of  iterative reconstruction technique. COMPARISON:  11/24/2019 FINDINGS: Brain: Stable hypodensities throughout the periventricular white matter consistent with chronic small vessel ischemic change. No evidence of acute infarct or hemorrhage. Lateral ventricles and midline structures are unremarkable. No acute extra-axial fluid collections. No mass effect. Vascular: No hyperdense vessel or unexpected calcification. Stable atherosclerosis. Skull: Normal. Negative for fracture or focal lesion. Sinuses/Orbits: No acute finding. Other: None. IMPRESSION: 1. Stable head CT, no acute intracranial process. Electronically Signed   By: Randa Ngo M.D.   On: 05/14/2021 15:27   MR ANGIO HEAD WO CONTRAST  Result Date: 05/14/2021 CLINICAL DATA:  Left-sided numbness and tingling EXAM: MRI HEAD WITHOUT CONTRAST MRA HEAD WITHOUT CONTRAST TECHNIQUE: Multiplanar, multi-echo pulse sequences of the brain and surrounding structures were acquired without intravenous contrast. Angiographic images of the Circle of Willis were acquired using MRA technique without intravenous contrast. COMPARISON:  No pertinent prior exam. FINDINGS: MRI HEAD FINDINGS Brain: 8 diffusion abnormality in the right parietal white matter, possibly subacute ischemia. No other diffusion abnormality. Focus of chronic microhemorrhage in the right cerebellum. Hyperintense T2-weighted signal is moderately widespread throughout the white matter. Generalized volume loss without a clear lobar predilection. The midline structures are normal. Vascular: Abnormal right ICA flow void Skull and upper cervical spine: Normal calvarium and skull base. Visualized upper cervical spine and soft tissues are normal. Sinuses/Orbits:No paranasal sinus fluid levels or advanced mucosal thickening. No mastoid or middle ear effusion. Normal orbits. MRA HEAD FINDINGS POSTERIOR CIRCULATION: --Vertebral arteries: Normal --Inferior cerebellar arteries: Normal. --Basilar artery: Normal.  --Superior cerebellar arteries: Normal. --Posterior cerebral arteries: Normal. ANTERIOR CIRCULATION: --Intracranial internal carotid arteries: The right ICA is occluded. --Anterior cerebral arteries (ACA): Anterior communicating artery appears discontinuous, but flow related enhancement in the right MCA suggested is patent. Otherwise normal anterior cerebral arteries. --Middle cerebral arteries (MCA): Normal. ANATOMIC VARIANTS: None IMPRESSION: 1. Small, subcentimeter focus of acute/subacute ischemia in the right parietal white matter. No hemorrhage or mass effect. 2. Occlusion of the right ICA. 3. Likely high grade stenosis of the right ACA distal A1 segment and anterior communicating artery. This could be confirmed with CTA if clinically indicated. 4. Chronic small vessel disease and volume loss. Electronically Signed   By: Ulyses Jarred M.D.   On: 05/14/2021 22:43   MR BRAIN WO CONTRAST  Result Date: 05/14/2021 CLINICAL DATA:  Left-sided numbness and tingling EXAM: MRI HEAD WITHOUT CONTRAST MRA HEAD WITHOUT CONTRAST TECHNIQUE: Multiplanar, multi-echo pulse sequences of the brain and surrounding structures were acquired without intravenous contrast. Angiographic images of the Circle of Willis were acquired using MRA technique without intravenous contrast. COMPARISON:  No pertinent prior exam. FINDINGS: MRI HEAD FINDINGS Brain: 8 diffusion abnormality in the right parietal white matter, possibly subacute ischemia. No other diffusion abnormality. Focus of chronic microhemorrhage in the right cerebellum. Hyperintense T2-weighted signal is moderately widespread throughout the white matter. Generalized volume loss without a clear lobar predilection. The midline structures are normal. Vascular: Abnormal right ICA flow void Skull and upper cervical spine: Normal calvarium and skull base. Visualized upper cervical spine and soft tissues are normal. Sinuses/Orbits:No paranasal sinus fluid levels or advanced mucosal  thickening. No mastoid or middle ear effusion. Normal orbits. MRA HEAD FINDINGS POSTERIOR CIRCULATION: --Vertebral arteries:  Normal --Inferior cerebellar arteries: Normal. --Basilar artery: Normal. --Superior cerebellar arteries: Normal. --Posterior cerebral arteries: Normal. ANTERIOR CIRCULATION: --Intracranial internal carotid arteries: The right ICA is occluded. --Anterior cerebral arteries (ACA): Anterior communicating artery appears discontinuous, but flow related enhancement in the right MCA suggested is patent. Otherwise normal anterior cerebral arteries. --Middle cerebral arteries (MCA): Normal. ANATOMIC VARIANTS: None IMPRESSION: 1. Small, subcentimeter focus of acute/subacute ischemia in the right parietal white matter. No hemorrhage or mass effect. 2. Occlusion of the right ICA. 3. Likely high grade stenosis of the right ACA distal A1 segment and anterior communicating artery. This could be confirmed with CTA if clinically indicated. 4. Chronic small vessel disease and volume loss. Electronically Signed   By: Ulyses Jarred M.D.   On: 05/14/2021 22:43   ECHOCARDIOGRAM COMPLETE  Result Date: 05/15/2021    ECHOCARDIOGRAM REPORT   Patient Name:   Judy Harvey Date of Exam: 05/15/2021 Medical Rec #:  [id number]         Height:       62.0 in Accession #:    [id number]        Weight:       161.0 lb Date of Birth:  11/01/1927         BSA:          1.743 m Patient Age:    49 years          BP:           103/83 mmHg Patient Gender: F                 HR:           70 bpm. Exam Location:  [facility] Procedure: 2D Echo, Cardiac Doppler and Color Doppler Indications:     Stroke I63.9  History:         Patient has no prior history of Echocardiogram examinations.                  Risk Factors:Hypertension and Diabetes.  Sonographer:     Alyse Low Roar Referring Phys:  [place] Diagnosing Phys: Ida Rogue MD IMPRESSIONS  1. Left ventricular ejection fraction, by estimation, is 60 to 65%. The left ventricle has  normal function. The left ventricle has no regional wall motion abnormalities. There is mild left ventricular hypertrophy. Left ventricular diastolic parameters are consistent with Grade II diastolic dysfunction (pseudonormalization).  2. Right ventricular systolic function is normal. The right ventricular size is normal. There is mildly elevated pulmonary artery systolic pressure.  3. The mitral valve is normal in structure. Mild mitral valve regurgitation. No evidence of mitral stenosis.  4. The aortic valve is normal in structure. Aortic valve regurgitation is not visualized. No aortic stenosis is present.  5. The inferior vena cava is normal in size with greater than 50% respiratory variability, suggesting right atrial pressure of 3 mmHg. FINDINGS  Left Ventricle: Left ventricular ejection fraction, by estimation, is 60 to 65%. The left ventricle has normal function. The left ventricle has no regional wall motion abnormalities. The left ventricular internal cavity size was normal in size. There is  mild left ventricular hypertrophy. Left ventricular diastolic parameters are consistent with Grade II diastolic dysfunction (pseudonormalization). Right Ventricle: The right ventricular size is normal. No increase in right ventricular wall thickness. Right ventricular systolic function is normal. There is mildly elevated pulmonary artery systolic pressure. The tricuspid regurgitant velocity is 2.93  m/s, and with an assumed right atrial pressure of 5  mmHg, the estimated right ventricular systolic pressure is [id number] mmHg. Left Atrium: Left atrial size was normal in size. Right Atrium: Right atrial size was normal in size. Pericardium: There is no evidence of pericardial effusion. Mitral Valve: The mitral valve is normal in structure. Mild mitral valve regurgitation. No evidence of mitral valve stenosis. Tricuspid Valve: The tricuspid valve is normal in structure. Tricuspid valve regurgitation is mild . No evidence of  tricuspid stenosis. Aortic Valve: The aortic valve is normal in structure. Aortic valve regurgitation is not visualized. No aortic stenosis is present. Aortic valve mean gradient measures 9.5 mmHg. Aortic valve peak gradient measures 21.4 mmHg. Aortic valve area, by VTI measures 1.51 cm. Pulmonic Valve: The pulmonic valve was normal in structure. Pulmonic valve regurgitation is not visualized. No evidence of pulmonic stenosis. Aorta: The aortic root is normal in size and structure. Venous: The inferior vena cava is normal in size with greater than 50% respiratory variability, suggesting right atrial pressure of 3 mmHg. IAS/Shunts: The interatrial septum was not assessed.  LEFT VENTRICLE PLAX 2D LVIDd:         5.23 cm   Diastology LVIDs:         3.68 cm   LV e' medial:    4.79 cm/s LV PW:         1.15 cm   LV E/e' medial:  29.2 LV IVS:        1.15 cm   LV e' lateral:   5.77 cm/s LVOT diam:     1.90 cm   LV E/e' lateral: 24.3 LV SV:         75 LV SV Index:   43 LVOT Area:     2.84 cm  RIGHT VENTRICLE RV Basal diam:  3.33 cm RV Mid diam:    2.80 cm RV S prime:     10.90 cm/s TAPSE (M-mode): 2.1 cm LEFT ATRIUM             Index        RIGHT ATRIUM          Index LA diam:        3.70 cm 2.12 cm/m   RA Area:     9.53 cm LA Vol (A2C):   50.2 ml 28.80 ml/m  RA Volume:   16.50 ml 9.46 ml/m LA Vol (A4C):   39.0 ml 22.37 ml/m LA Biplane Vol: 44.9 ml 25.76 ml/m  AORTIC VALVE                     PULMONIC VALVE AV Area (Vmax):    1.41 cm      PV Vmax:        0.94 m/s AV Area (Vmean):   1.57 cm      PV Peak grad:   3.5 mmHg AV Area (VTI):     1.51 cm      RVOT Peak grad: 3 mmHg AV Vmax:           231.50 cm/s AV Vmean:          136.500 cm/s AV VTI:            0.492 m AV Peak Grad:      21.4 mmHg AV Mean Grad:      9.5 mmHg LVOT Vmax:         115.50 cm/s LVOT Vmean:        75.800 cm/s LVOT VTI:          0.263  m LVOT/AV VTI ratio: 0.53  AORTA Ao Root diam: 2.60 cm Ao Asc diam:  2.70 cm MITRAL VALVE                TRICUSPID  VALVE MV Area (PHT): 3.34 cm     TR Peak grad:   34.3 mmHg MV Decel Time: 227 msec     TR Vmax:        293.00 cm/s MV E velocity: 140.00 cm/s MV A velocity: 124.00 cm/s  SHUNTS MV E/A ratio:  1.13         Systemic VTI:  0.26 m MV A Prime:    7.7 cm/s     Systemic Diam: 1.90 cm Ida Rogue MD Electronically signed by Ida Rogue MD Signature Date/Time: 05/15/2021/2:12:30 PM    Final     SARS-CoV-2 PCR: Negative Influenza A/B: Negative  Family Communication: Son by phone at the bedside  Disposition: Status is: Inpatient Remains inpatient appropriate because: New hypoxemia Planned Discharge Destination: Home  Patrecia Pour, MD 05/15/2021 5:14 PM Page by [email or web address]

## 2021-05-15 NOTE — Consult Note (Addendum)
NEUROLOGY CONSULTATION NOTE   Date of service: May 15, 2021 Patient Name: Judy Harvey MRN:  [id number] DOB:  08-13-1927 Reason for consult: acute stroke Requesting physician: Dr. Vance Gather _ _ _   _ __   _ __ _ _  __ __   _ __   __ _  History of Present Illness   This is a 88 year old woman with past medical history significant for diabetes type 2, hyperlipidemia, hypertension, legally blind secondary to macular degeneration who presented secondary to left-sided numbness and it was found to have an acute ischemic infarct on MRI. Personal review of MRI brain showed small subcentimeter focus of acute ischemia in the right parietal white matter.  MRA noted occlusion of the right ICA with likely high-grade stenosis of the right ACA distal A1 segment and anterior communicating artery.  CTA head and neck was ordered for further evaluation and is pending.  TTE is pending. She is typically on ASA 81mg  daily + plavix 75mg  daily as well as atorvastatin 80mg  daily. Plavix was recently held for a procedure. Her sx are unchanged since admission and she has no new complaints since presentation to ED.  Stroke Labs     Component Value Date/Time   CHOL 143 05/15/2021 0734   TRIG 73 05/15/2021 0734   HDL 36 (L) 05/15/2021 0734   CHOLHDL 4.0 05/15/2021 0734   VLDL 15 05/15/2021 0734   LDLCALC 92 05/15/2021 0734    Lab Results  Component Value Date/Time   HGBA1C 8.1 (H) 10/25/2020 02:00 PM      ROS   Per HPI: all other systems reviewed and are negative  Past History   I have reviewed the following:  Past Medical History:  Diagnosis Date   Anemia    Depression    Diabetes mellitus without complication (HCC)    GERD (gastroesophageal reflux disease)    History of COVID-19    Hyperlipidemia    Hypertension    Legally blind    Macular degeneration, bilateral    Peripheral neuropathy    Post herpetic neuralgia    located in her back   Stroke [facility])    Past Surgical History:   Procedure Laterality Date   ABDOMINAL HYSTERECTOMY     partial   APPENDECTOMY     BREAST SURGERY     reduction   CAROTID PTA/STENT INTERVENTION Left 10/18/2017   Procedure: CAROTID PTA/STENT INTERVENTION;  Surgeon: Algernon Huxley, MD;  Location: [facility];  Service: Cardiovascular;  Laterality: Left;   CATARACT EXTRACTION, BILATERAL     CESAREAN SECTION     x3   COLONOSCOPY     ESOPHAGEAL DILATION     Family History  Problem Relation Age of Onset   Healthy Mother    Diabetes Father    Alzheimer's disease Father    Multiple sclerosis Sister    Multiple sclerosis Brother    Social History   Socioeconomic History   Marital status: Widowed    Spouse name: Not on file   Number of children: 3   Years of education: some college   Highest education level: Not on file  Occupational History   Occupation: Disabled  Tobacco Use   Smoking status: Former    Packs/day: 3.00    Years: 50.00    Pack years: 150.00    Types: Cigarettes    Quit date: 2008    Years since quitting: 15.1   Smokeless tobacco: Never   Tobacco comments:  smoking cessation materials not required  Vaping Use   Vaping Use: Never used  Substance and Sexual Activity   Alcohol use: No   Drug use: No   Sexual activity: Not Currently  Other Topics Concern   Not on file  Social History Narrative   Pt lives with son [name]   Social Determinants of Health   Financial Resource Strain: Not on file  Food Insecurity: Not on file  Transportation Needs: Not on file  Physical Activity: Not on file  Stress: Not on file  Social Connections: Not on file   Allergies  Allergen Reactions   Adhesive [Tape] Other (See Comments)    "pulls my skin off" paper tape ok   Lyrica [Pregabalin]     Body spasms    Medications   (Not in a hospital admission)     Current Facility-Administered Medications:     stroke: mapping our early stages of recovery book, , Does not apply, Once, Para Skeans, MD   0.9 %   sodium chloride infusion, , Intravenous, Continuous, Para Skeans, MD, Last Rate: 50 mL/hr at 05/15/21 0137, New Bag at 05/15/21 0137   acetaminophen (TYLENOL) tablet 650 mg, 650 mg, Oral, Q4H PRN **OR** [DISCONTINUED] acetaminophen (TYLENOL) 160 MG/5ML solution 650 mg, 650 mg, Per Tube, Q4H PRN **OR** acetaminophen (TYLENOL) suppository 650 mg, 650 mg, Rectal, Q4H PRN, Posey Pronto, Ekta V, MD   albuterol (PROVENTIL) (2.5 MG/3ML) 0.083% nebulizer solution 1.5 mL, 1.5 mL, Nebulization, Q6H PRN, Para Skeans, MD   aspirin EC tablet 81 mg, 81 mg, Oral, Daily, Florina Ou V, MD, 81 mg at 05/15/21 1057   atorvastatin (LIPITOR) tablet 80 mg, 80 mg, Oral, QHS, Florina Ou V, MD, 80 mg at 05/15/21 0155   cholecalciferol (VITAMIN D3) tablet 5,000 Units, 5,000 Units, Oral, Daily, Florina Ou V, MD, 5,000 Units at 05/15/21 1056   clopidogrel (PLAVIX) tablet 75 mg, 75 mg, Oral, Daily, Posey Pronto, Ekta V, MD   DULoxetine (CYMBALTA) DR capsule 30 mg, 30 mg, Oral, Daily, Florina Ou V, MD, 30 mg at 05/15/21 1057   gabapentin (NEURONTIN) capsule 600 mg, 600 mg, Oral, TID, Florina Ou V, MD, 600 mg at 05/15/21 1057   heparin injection 5,000 Units, 5,000 Units, Subcutaneous, Q8H, Florina Ou V, MD, 5,000 Units at 05/15/21 0152   insulin aspart (novoLOG) injection 0-5 Units, 0-5 Units, Subcutaneous, QHS, Patel, Ekta V, MD   insulin aspart (novoLOG) injection 0-9 Units, 0-9 Units, Subcutaneous, Q6H PRN, Para Skeans, MD   mirtazapine (REMERON) tablet 7.5 mg, 7.5 mg, Oral, QHS, Patel, Ekta V, MD, 7.5 mg at 05/15/21 0156   ondansetron (ZOFRAN) tablet 8 mg, 8 mg, Oral, Q8H PRN, Para Skeans, MD  Current Outpatient Medications:    acetaminophen (TYLENOL) 325 MG tablet, Take 650 mg by mouth every 6 (six) hours as needed for mild pain or fever., Disp: , Rfl:    aspirin EC 81 MG tablet, Take 81 mg by mouth daily., Disp: , Rfl:    Cholecalciferol (VITAMIN D3) 5000 units CAPS, Take 1 capsule (5,000 Units total) by mouth daily., Disp:  30 capsule, Rfl:    ferrous sulfate 325 (65 FE) MG tablet, Take 325 mg by mouth daily., Disp: , Rfl:    gabapentin (NEURONTIN) 300 MG capsule, Take 300 mg by mouth daily. For 90 days, Disp: , Rfl:    ondansetron (ZOFRAN) 8 MG tablet, Take 8 mg by mouth every 8 (eight) hours as needed for nausea or vomiting. , Disp: ,  Rfl:    traZODone (DESYREL) 50 MG tablet, Take 50 mg by mouth at bedtime., Disp: , Rfl:    albuterol (ACCUNEB) 1.25 MG/3ML nebulizer solution, Take 3 mLs by nebulization every 6 (six) hours as needed for wheezing or shortness of breath., Disp: , Rfl:    albuterol (VENTOLIN HFA) 108 (90 Base) MCG/ACT inhaler, Inhale 2 puffs into the lungs every 6 (six) hours as needed for shortness of breath or wheezing., Disp: , Rfl:    amLODipine (NORVASC) 5 MG tablet, Take 5 mg by mouth daily., Disp: , Rfl:    atorvastatin (LIPITOR) 80 MG tablet, TAKE 1 TABLET BY MOUTH EVERYDAY AT BEDTIME (Patient taking differently: Take 80 mg by mouth at bedtime.), Disp: 90 tablet, Rfl: 1   clobetasol ointment (TEMOVATE) [id number] %, Apply 1 application topically 2 (two) times daily., Disp: , Rfl:    clopidogrel (PLAVIX) 75 MG tablet, Take 1 tablet (75 mg total) by mouth daily. (Patient not taking: Reported on 05/15/2021), Disp: 90 tablet, Rfl: 1   clopidogrel (PLAVIX) 75 MG tablet, Take 1 tablet by mouth daily. TAKE 1 TABLET BY MOUTH EVERY DAY (Patient not taking: Reported on 05/15/2021), Disp: , Rfl:    DULoxetine (CYMBALTA) 30 MG capsule, TAKE 1 CAPSULE BY MOUTH EVERY DAY (Patient taking differently: Take 30 mg by mouth daily.), Disp: 90 capsule, Rfl: 1   DULoxetine (CYMBALTA) 30 MG capsule, Take 1 tablet by mouth daily. TAKE 1 CAPSULE BY MOUTH EVERY DAY, Disp: , Rfl:    gabapentin (NEURONTIN) 100 MG capsule, Take 6 capsules (600 mg total) by mouth 3 (three) times daily., Disp: , Rfl:    insulin glargine (LANTUS) 100 UNIT/ML injection, Inject 0.4 mLs (40 Units total) into the skin daily. INJECT 50 UNITS INTO THE SKIN DAILY  AT 10 PM., Disp: 20 mL, Rfl: 5   Insulin Syringe-Needle U-100 (B-D INS SYR ULTRAFINE .3CC/31G) 31G X 5/16" 0.3 ML MISC, Use to administer insulin daily to control diabetes. Diagnosis: E11.40 Controlled Type 2 Diabetes with diabetic neuropathy., Disp: 100 each, Rfl: 3   Melatonin 3 MG CAPS, Take by mouth. Take 3 mg by mouth at bedtime, Disp: , Rfl:    metFORMIN (GLUCOPHAGE-XR) 500 MG 24 hr tablet, Take 500 mg by mouth 2 (two) times daily. (Patient not taking: Reported on 05/15/2021), Disp: , Rfl:    mirtazapine (REMERON) 7.5 MG tablet, Take 7.5 mg by mouth at bedtime., Disp: , Rfl:    mirtazapine (REMERON) 7.5 MG tablet, Take by mouth. Take 1 tablet (7.5 mg total) by mouth at bedtime, Disp: , Rfl:    nystatin (MYCOSTATIN/NYSTOP) powder, Apply topically 2 (two) times daily., Disp: , Rfl:    nystatin (MYCOSTATIN/NYSTOP) powder, Apply topically 2 (two) times daily. Apply topically 2 (two) times daily, Disp: , Rfl:    pantoprazole (PROTONIX) 40 MG tablet, Take 40 mg by mouth 2 (two) times daily., Disp: , Rfl:    pantoprazole (PROTONIX) 40 MG tablet, Take by mouth. Take 1 tablet (40 mg total) by mouth 2 (two) times daily before meals, Disp: , Rfl:   Vitals   Vitals:   05/15/21 0630 05/15/21 0725 05/15/21 0726 05/15/21 1200  BP:  118/88 118/88 (!) 146/62  Pulse:   68 69  Resp: (!) 22  18 20   Temp:      TempSrc:      SpO2:   95% 95%  Weight:      Height:         Body mass index is 29.45 kg/m.  Physical Exam   Physical Exam Gen: asleep but arousable, oriented x3, follows simple commands HEENT: Atraumatic, normocephalic;mucous membranes moist; oropharynx clear, tongue without atrophy or fasciculations. Neck: Supple, trachea midline. Resp: CTAB, no w/r/r CV: RRR, no m/g/r; nml S1 and S2. 2+ symmetric peripheral pulses. Abd: soft/NT/ND; nabs x 4 quad Extrem: Nml bulk; no cyanosis, clubbing, or edema.  Neuro: *MS: asleep but arousable, oriented x3, follows simple commands *Speech: fluid,  moderate dysarthria, able to name and repeat *CN:    I: Deferred   II,III: PERRLA, legally blind in both eyes (chronic), optic discs unable to be visualized 2/2 pupillary constriction   III,IV,VI: EOMI w/o nystagmus, no ptosis   V: Sensation intact from V1 to V3 to LT   VII: Eyelid closure was full.  Smile symmetric.   VIII: Hearing intact to voice   IX,X: Voice normal, palate elevates symmetrically    XI: SCM/trap 5/5 bilat   XII: Tongue protrudes midline, no atrophy or fasciculations   *Motor:   Normal bulk.  No tremor, rigidity or bradykinesia. No pronator drift. 5/5 all extremities throughout except 4+/5 LUE grip and tricep.  *Sensory: Impaired to LT LUE and LLE *Coordination:  UTA FNF 2/2 visual impairment *Reflexes:  1+ and symmetric throughout without clonus; toes mute bilat *Gait: deferred  NIHSS  1a Level of Conscious.: 0 1b LOC Questions: 0 1c LOC Commands: 0 2 Best Gaze: 0 3 Visual: 2 4 Facial Palsy: 0 5a Motor Arm - left: 0 5b Motor Arm - Right: 0 6a Motor Leg - Left: 0 6b Motor Leg - Right: 0 7 Limb Ataxia: 0 8 Sensory: 1 9 Best Language: 0 10 Dysarthria: 1 11 Extinct. and Inatten.: 0  TOTAL: 4   Premorbid mRS = 1   Labs   CBC:  Recent Labs  Lab 05/14/21 1450  WBC 10.0  NEUTROABS 8.0*  HGB 8.3*  HCT 29.5*  MCV 89.7  PLT [id number]    Basic Metabolic Panel:  Lab Results  Component Value Date   NA 139 05/14/2021   K 3.3 (L) 05/14/2021   CO2 29 05/14/2021   GLUCOSE 152 (H) 05/14/2021   BUN 25 (H) 05/14/2021   CREATININE 1.02 (H) 05/14/2021   CALCIUM 8.5 (L) 05/14/2021   GFRNONAA 52 (L) 05/14/2021   GFRAA 56 (L) 11/26/2019   Lipid Panel:  Lab Results  Component Value Date   LDLCALC 92 05/15/2021   HgbA1c:  Lab Results  Component Value Date   HGBA1C 8.1 (H) 10/25/2020   Urine Drug Screen:     Component Value Date/Time   LABOPIA NONE DETECTED 11/24/2019 1542   COCAINSCRNUR NONE DETECTED 11/24/2019 1542   LABBENZ NONE DETECTED  11/24/2019 1542   AMPHETMU NONE DETECTED 11/24/2019 1542   THCU NONE DETECTED 11/24/2019 1542   LABBARB NONE DETECTED 11/24/2019 1542    Alcohol Level No results found for: [place]   Impression   88 year old woman with past medical history significant for diabetes type 2, hyperlipidemia, hypertension, legally blind secondary to macular degeneration who presented secondary to left-sided numbness and it was found to have an acute ischemic infarct in the R parietal lobe on MRI brain.   Recommendations   - Permissive HTN x48 hrs from sx onset. PRN labetalol or hydralazine if BP above these parameters. Avoid oral antihypertensives. After 48 hrs goal is normotension with strict avoidance of hypotension 2/2 known ICA occlusion. - CTA H&N pending - TTE pending - Resume aspirin 81mg  daily + plavix 75mg  daily (home medications) -  Continue home atorvastatin 80mg  daily - STAT head CT for any change in neuro exam - Tele - PT/OT/SLP - Stroke education  I will f/u on CTA and TTE and arrange outpatient neurology f/u.  Addendum:  TTE shows grade II diastolic dysfunction without intracardiac clot or other significant abnormalities  CTA neck:   1. The right internal carotid artery is occluded beginning at its origin and remains occluded throughout the remainder of the neck. 2. A stent traverses the distal left common carotid artery and proximal left internal carotid artery. Stent artifact limits evaluation for intra-stent stenosis. However, the stent appears patent. No hemodynamically significant stenosis elsewhere within the left common or internal carotid arteries within the neck. 3. Vertebral arteries codominant and patent within the neck without hemodynamically significant stenosis. Mild atherosclerotic plaque within the left V1, left V2 and bilateral V3 vertebral arteries.   CTA head:   1. The right internal carotid artery remains occluded intracranially. 2. Additional intracranial  atherosclerotic disease, as described. Most notably, there is an apparent moderate/severe stenosis within the distal M1 segment of the right middle cerebral artery. 3. No appreciable stenosis within the right A1 segment or anterior communicating artery, as was questioned on the recent prior MRA head.  No further neurologic workup indicated as inpatient. Discharge patient on home meds ASA 81mg  daily + plavix 75mg  daily + atorvastatin 80mg  daily. I will arrange neuro f/u. ______________________________________________________________________   Thank you for the opportunity to take part in the care of this patient. If you have any further questions, please contact the neurology consultation attending.  Signed,  Su Monks, MD [facility] [phone]  If 7pm- 7am, please page neurology on call as listed in [place].

## 2021-05-15 NOTE — Evaluation (Signed)
Occupational Therapy Evaluation ?Patient Details ?Name: Judy Harvey ?MRN: [id number] ?DOB: September 29, 1927 ?Today's Date: 05/15/2021 ? ? ?History of Present Illness Pt is a 88 y/o F with PMH: CAD s/p stent, HTN, DM2, legally blind, chronic anemia, and recent COVID. She presented on this admission d/t L side numbness and was found on MRI to have R parietal infarction.  ? ?Clinical Impression ?  ?Pt seen for OT evaluation this date in setting of acute hospitalization d/t CVA. Pt presents this date with decreased L hand [place], numbness to L LE, decreased balance and decreased fxl activity tolerance. At baseline, she reports living with her son and relying on him and his significant other for IADLs while she has a [place] aide that comes 4x/wk for bathing, dressing and laundry tasks (folds pt's clothes and puts them away). Pt currently requires: Pt requires SETUP to MIN A for seated UB ADLs d/t decreasd sensation and grip strength in L hand. Requires MOD A for LB ADLs in sitting. MIN A/CGA for ADL transfers and fxl mobility with RW. She is transferred to recliner end of session with all needs met and in reach. RN notified. Will continue to follow. Recommend HHOT f/u.    ? ?Recommendations for follow up therapy are one component of a multi-disciplinary discharge planning process, led by the attending physician.  Recommendations may be updated based on patient status, additional functional criteria and insurance authorization.  ? ?Follow Up Recommendations ? Home health OT  ?  ?Assistance Recommended at Discharge Intermittent Supervision/Assistance  ?Patient can return home with the following A little help with walking and/or transfers;A little help with bathing/dressing/bathroom ? ?  ?Functional Status Assessment ? Patient has had a recent decline in their functional status and demonstrates the ability to make significant improvements in function in a reasonable and predictable amount of time.  ?Equipment Recommendations ?  BSC/3in1;Tub/shower seat  ?  ?Recommendations for Other Services   ? ? ?  ?Precautions / Restrictions Precautions ?Precautions: Fall ?Restrictions ?Weight Bearing Restrictions: No  ? ?  ? ?Mobility Bed Mobility ?Overal bed mobility: Needs Assistance ?Bed Mobility: Supine to Sit ?  ?  ?Supine to sit: Min assist, HOB elevated ?  ?  ?  ?  ? ?Transfers ?Overall transfer level: Needs assistance ?Equipment used: Rolling walker (2 wheels) ?Transfers: Sit to/from Stand ?Sit to Stand: Min guard, Min assist ?  ?  ?  ?  ?  ?  ?  ? ?  ?Balance Overall balance assessment: Needs assistance ?Sitting-balance support: Feet unsupported ?Sitting balance-Leahy Scale: Good ?  ?  ?Standing balance support: Bilateral upper extremity supported, During functional activity ?Standing balance-Leahy Scale: Fair ?  ?  ?  ?  ?  ?  ?  ?  ?  ?  ?  ?  ?   ? ?ADL either performed or assessed with clinical judgement  ? ?ADL   ?  ?  ?  ?  ?  ?  ?  ?  ?  ?  ?  ?  ?  ?  ?  ?  ?  ?  ?  ?General ADL Comments: Pt requires SETUP to MIN A for seated UB ADLs d/t decreasd sensation and grip strength in L hand. Requires MOD A for LB ADLs in sitting. MIN A/CGA for ADL transfers and fxl mobility with RW.  ? ? ? ?Vision Patient Visual Report: No change from baseline ?   ?   ?Perception   ?  ?Praxis   ?  ? ?  Pertinent Vitals/Pain Pain Assessment ?Pain Assessment: No/denies pain  ? ? ? ?Hand Dominance Right ?  ?Extremity/Trunk Assessment Upper Extremity Assessment ?Upper Extremity Assessment: RUE deficits/detail;LUE deficits/detail ?RUE Deficits / Details: ROM WFL, MMT grossly 4-/5 ?LUE Deficits / Details: ROM to 1/2 range shld flexion, Grip strength 3+/5 ?LUE Sensation: decreased light touch ?LUE Coordination: decreased fine motor ?  ?Lower Extremity Assessment ?Lower Extremity Assessment: RLE deficits/detail;LLE deficits/detail ?LLE Deficits / Details: c/o numbness and noted to have less clearance with taking steps to get to recliner ?LLE Sensation: decreased light  touch ?LLE Coordination: decreased gross motor ?  ?  ?  ?Communication Communication ?Communication: HOH ?  ?Cognition Arousal/Alertness: Awake/alert ?Behavior During Therapy: [facility] for tasks assessed/performed ?Overall Cognitive Status: Within Functional Limits for tasks assessed ?  ?  ?  ?  ?  ?  ?  ?  ?  ?  ?  ?  ?  ?  ?  ?  ?  ?  ?  ?General Comments    ? ?  ?Exercises Other Exercises ?Other Exercises: OT ed re: role, [facility] exercise for L hand including oposition/reposition, flexion/extension, and abduction/adduction. ?  ?Shoulder Instructions    ? ? ?Home Living Family/patient expects to be discharged to:: Private residence ?Living Arrangements: Children (son) ?Available Help at Discharge: Family;Personal care attendant (son and his girlfriend assist pt PRN, pt has aide 4x/wk for bathing, dressing, and laundry help) ?Type of Home: House ?Home Access: Stairs to enter ?Entrance Stairs-Number of Steps: 1 ?  ?Home Layout: Two level;Able to live on main level with bedroom/bathroom ?Alternate Level Stairs-Number of Steps: 2 ?  ?Bathroom Shower/Tub: Walk-in shower ?  ?  ?  ?  ?Home Equipment: Conservation officer, nature (2 wheels) ?  ?  ?  ? ?  ?Prior Functioning/Environment Prior Level of Function : Independent/Modified Independent;Needs assist ?  ?  ?  ?Physical Assist : ADLs (physical) ?  ?ADLs (physical): Bathing;Dressing;IADLs ?Mobility Comments: has 2WW for community mobility ?ADLs Comments: aide help 4x/wk for bathing/dressing and laundry. Family assist with IADLs ?  ? ?  ?  ?OT Problem List: Decreased strength;Decreased activity tolerance;Decreased coordination ?  ?   ?OT Treatment/Interventions: Self-care/ADL training;Therapeutic exercise;DME and/or AE instruction;Therapeutic activities;Neuromuscular education;Balance training  ?  ?OT Goals(Current goals can be found in the care plan section) Acute Rehab OT Goals ?Patient Stated Goal: to go home ?OT Goal Formulation: With patient ?Time For Goal Achievement:  05/29/21 ?Potential to Achieve Goals: Good  ?OT Frequency: Min 2X/week ?  ? ?Co-evaluation   ?  ?  ?  ?  ? ?  ?AM-PAC OT "6 Clicks" Daily Activity     ?Outcome Measure Help from another person eating meals?: A Little ?Help from another person taking care of personal grooming?: A Little ?Help from another person toileting, which includes using toliet, bedpan, or urinal?: A Little ?Help from another person bathing (including washing, rinsing, drying)?: A Little ?Help from another person to put on and taking off regular upper body clothing?: A Little ?Help from another person to put on and taking off regular lower body clothing?: A Little ?6 Click Score: 18 ?  ?End of Session Equipment Utilized During Treatment: Gait belt;Rolling walker (2 wheels) ?Nurse Communication: Mobility status;Other (comment) (ordered breakfast) ? ?Activity Tolerance: Patient tolerated treatment well ?Patient left: in chair;with call bell/phone within reach ? ?OT Visit Diagnosis: Unsteadiness on feet (R26.81);Other abnormalities of gait and mobility (R26.89);Muscle weakness (generalized) (M62.81);Other (comment) (R27.8 other lack of coordination)  ?              ?  Time: 2666-6486 ?OT Time Calculation (min): 35 min ?Charges:  OT General Charges ?$OT Visit: 1 Visit ?OT Evaluation ?$OT Eval Moderate Complexity: 1 Mod ?OT Treatments ?$Self Care/Home Management : 8-22 mins ? ?Gerrianne Scale, [place], OTR/L ?ascom [phone] ?05/15/21, 10:45 AM  ?

## 2021-05-15 NOTE — Assessment & Plan Note (Addendum)
Pt denied that she'd ever needed oxygen before but she has concentrator at home per Palliative. At any rate, she doesn't wear oxygen at baseline but has mild exertional hypoxemia despite no dyspnea and nonlabored respiratory effort. ?- CXR without infiltrate, she's not clinically volume overloaded, and has no other signs or symptoms to suggest PE at this time. No wheezing. We will order O2 for home, suspect she may chronically be a bit hypoxemic. Continue home bronchodilator. ?

## 2021-05-15 NOTE — Assessment & Plan Note (Addendum)
Acute right parietal white matter infarct. Additionally noted is right ICA occlusion and moderate-severe right distal M1 stenosis. No cardioembolic source or evidence of AFib at this time. ?- PT/OT/SLP consulted as pt still has left-sided numbness > will benefit from home health therapies, ordered. ?- Continue aspirin 81mg  daily, plavix 75mg  daily, atorvastatin 80mg  daily per neurology recommendations, discussed with Dr. . ?

## 2021-05-15 NOTE — ED Notes (Signed)
Pt taken to CT at this time.

## 2021-05-16 ENCOUNTER — Encounter: Payer: Self-pay | Admitting: Internal Medicine

## 2021-05-16 DIAGNOSIS — R531 Weakness: Secondary | ICD-10-CM | POA: Diagnosis not present

## 2021-05-16 DIAGNOSIS — D509 Iron deficiency anemia, unspecified: Secondary | ICD-10-CM | POA: Diagnosis not present

## 2021-05-16 DIAGNOSIS — J9601 Acute respiratory failure with hypoxia: Secondary | ICD-10-CM | POA: Diagnosis not present

## 2021-05-16 DIAGNOSIS — I10 Essential (primary) hypertension: Secondary | ICD-10-CM | POA: Diagnosis not present

## 2021-05-16 DIAGNOSIS — E782 Mixed hyperlipidemia: Secondary | ICD-10-CM | POA: Diagnosis not present

## 2021-05-16 DIAGNOSIS — J690 Pneumonitis due to inhalation of food and vomit: Secondary | ICD-10-CM | POA: Diagnosis not present

## 2021-05-16 LAB — CBC
HCT: 26.7 % — ABNORMAL LOW (ref 36.0–46.0)
Hemoglobin: 7.5 g/dL — ABNORMAL LOW (ref 12.0–15.0)
MCH: 25.3 pg — ABNORMAL LOW (ref 26.0–34.0)
MCHC: 28.1 g/dL — ABNORMAL LOW (ref 30.0–36.0)
MCV: 90.2 fL (ref 80.0–100.0)
Platelets: 261 10*3/uL (ref 150–400)
RBC: 2.96 MIL/uL — ABNORMAL LOW (ref 3.87–5.11)
RDW: 23.2 % — ABNORMAL HIGH (ref 11.5–15.5)
WBC: 7.9 10*3/uL (ref 4.0–10.5)
nRBC: 0 % (ref 0.0–0.2)

## 2021-05-16 LAB — BASIC METABOLIC PANEL
Anion gap: 7 (ref 5–15)
BUN: 22 mg/dL (ref 8–23)
CO2: 31 mmol/L (ref 22–32)
Calcium: 8.3 mg/dL — ABNORMAL LOW (ref 8.9–10.3)
Chloride: 106 mmol/L (ref 98–111)
Creatinine, Ser: 1 mg/dL (ref 0.44–1.00)
GFR, Estimated: 53 mL/min — ABNORMAL LOW (ref >60)
Glucose, Bld: 88 mg/dL (ref 70–99)
Potassium: 4.1 mmol/L (ref 3.5–5.1)
Sodium: 144 mmol/L (ref 135–145)

## 2021-05-16 LAB — GLUCOSE, CAPILLARY
Glucose-Capillary: 89 mg/dL (ref 70–99)
Glucose-Capillary: 100 mg/dL — ABNORMAL HIGH (ref 70–99)
Glucose-Capillary: 141 mg/dL — ABNORMAL HIGH (ref 70–99)

## 2021-05-16 LAB — MAGNESIUM: Magnesium: 2.2 mg/dL (ref 1.7–2.4)

## 2021-05-16 LAB — PROCALCITONIN: Procalcitonin: <0.1 ng/mL

## 2021-05-16 MED ORDER — SODIUM CHLORIDE 0.9 % IV SOLN
200.0000 mg | Freq: Once | INTRAVENOUS | Status: AC
  Administered 2021-05-16: 200 mg via INTRAVENOUS
  Filled 2021-05-16: qty 200

## 2021-05-16 NOTE — Progress Notes (Signed)
Patient discharging home. IV removed. Oxygen delivered before discharge. Patient going home POV with son.  ?

## 2021-05-16 NOTE — TOC Transition Note (Signed)
Transition of Care (TOC) - CM/SW Discharge Note ? ? ?Patient Details  ?Name: Judy Harvey ?MRN: [id number] ?Date of Birth: Feb 05, 1928 ? ?Transition of Care (TOC) CM/SW Contact:  ?Bing Quarry, RN ?Phone Number: ?05/16/2021, 11:15 AM ? ? ?Clinical Narrative:   3/5: Son, Dim Meisinger, called RN CM back and states they have all the services in place, including PT/OT and private care services. Current with [employer] for home DME oxygen. Has a RW at home. Confirmed PCP/RX. Son will provider transportation home. He had no further questions regarding discharge plans or anticipated needs. Gabriel Cirri RN CM  ? ? ? ?Final next level of care: Home w Home Health Services ?Barriers to Discharge: Barriers Resolved ? ? ?Patient Goals and CMS Choice ?  ?  ?Choice offered to / list presented to : NA ? ?Discharge Placement ?  ?           ?  ?  ?  ?  ? ?Discharge Plan and Services ?  ?Discharge Planning Services: CM Consult ?Post Acute Care Choice: Home Health, Durable Medical Equipment, Resumption of Svcs/PTA Provider (Has services and DME in place. [employer] to provider transport oxygen.)          ?DME Arranged: Oxygen ?DME Agency: [employer] ?Date DME Agency Contacted: 05/16/21 ?Time DME Agency Contacted: 1007 ?Representative spoke with at DME Agency: Leavy Cella ?HH Arranged:  (Has services in place per son.) ?HH Agency: Other - See comment ([facility] per son PTA/current) ?  ?  ?  ? ?Social Determinants of Health (SDOH) Interventions ?  ? ? ?Readmission Risk Interventions ?No flowsheet data found. ? ? ? ? ?

## 2021-05-16 NOTE — TOC Progression Note (Signed)
Transition of Care (TOC) - Progression Note  ? ? ?Patient Details  ?Name: Judy Harvey ?MRN: [id number] ?Date of Birth: 1927/05/29 ? ?Transition of Care (TOC) CM/SW Contact  ?Judy Quarry, RN ?Phone Number: ?05/16/2021, 10:15 AM ? ?Clinical Narrative:  3/5: Patient is very hard to understand to determine understanding. Unable to contact son this am for [facility] and discharge planning/assessment. Discharge orders came in just prior to 36 hour mark for MOON. [employer] contacted and patient is in service with [employer] for DME home oxygen and will bring transport tank, new tubing, and will arrange to check on current home condenser with patient. PT notes indicate RW at home. Still to determine [facility] issues.  ?Contact: Judy, Harvey (Son) (Lives with) ?[phone] ([place]).  ?Judy Cirri RN CM  ? ? ? ?  ?  ? ?Expected Discharge Plan and Services ?  ?  ?  ?  ?  ?Expected Discharge Date: 05/16/21               ?DME Arranged: Oxygen ?DME Agency: [employer] ?Date DME Agency Contacted: 05/16/21 ?Time DME Agency Contacted: 1007 ?Representative spoke with at DME Agency: Judy Harvey ?  ?  ?  ?  ?  ? ? ?Social Determinants of Health (SDOH) Interventions ?  ? ?Readmission Risk Interventions ?No flowsheet data found. ? ?

## 2021-05-16 NOTE — Progress Notes (Signed)
Physical Therapy Treatment ?Patient Details ?Name: Judy Harvey ?MRN: [id number] ?DOB: 1927/10/18 ?Today's Date: 05/16/2021 ? ? ?History of Present Illness Pt is a 88 y/o F who presented with c/o L sided numbness. CT head is unremarkable. MRI reveals Small, subcentimeter focus of acute/subacute ischemia in the  right parietal white matter, No hemorrhage or mass effect, Occlusion of the R ICA, Likely high grade stenosis of the right ACA distal A1 segment and  anterior communicating artery. This could be confirmed with CTA if clinically indicated, and Chronic small vessel disease and volume loss. PMH: CVA, HTN, DM2, macular degeneration in B eyes/legally blind, HLD, peripheral neuroapathy ? ?  ?PT Comments  ? ? Pt seen for PT tx with pt agreeable. Pt requires min assist for bed mobility & CGA<>min assist for transfers & gait in room with RW. Pt with incontinent void upon standing, which pt reports is new since stroke, & PT educated her on use of depends at home. PT assists pt with changing into clean gown & new socks. Pt is able to ambulate in room with RW & CGA<>Min assist & transfers chair<>BSC without AD with CGA to have additional continent void. PT educates pt on increased fall risk 2/2 impaired vision & hearing & new stroke & importance of slowing down with all mobility tasks to increase safety & pt with improving ability after education. Pt requires supplemental O2 & PT educated on need to keep nasal cannula donned. When pt is on room air after blowing nose SpO2 drops to 84% but increases to 89-90% on 2L/min. Pt continues to demonstrate "jerking" movements in BUE (L worse than R) & decreased ability to grasp objects (drops medications nurse hands to her & difficulty holding cup). Pt would benefit from ongoing PT services to address deficits & increase safety with mobility. ?   ?Recommendations for follow up therapy are one component of a multi-disciplinary discharge planning process, led by the attending  physician.  Recommendations may be updated based on patient status, additional functional criteria and insurance authorization. ? ?Follow Up Recommendations ? Home health PT ?  ?  ?Assistance Recommended at Discharge Frequent or constant Supervision/Assistance  ?Patient can return home with the following A little help with walking and/or transfers;A little help with bathing/dressing/bathroom;Assistance with cooking/housework;Assistance with feeding;Direct supervision/assist for medications management;Help with stairs or ramp for entrance;Direct supervision/assist for financial management;Assist for transportation ?  ?Equipment Recommendations ? Rolling walker (2 wheels);BSC/3in1  ?  ?Recommendations for Other Services   ? ? ?  ?Precautions / Restrictions Precautions ?Precautions: Fall ?Precaution Comments: legally blind, HOH ?Restrictions ?Weight Bearing Restrictions: No  ?  ? ?Mobility ? Bed Mobility ?Overal bed mobility: Needs Assistance ?Bed Mobility: Supine to Sit ?  ?  ?Supine to sit: Min assist ?  ?  ?General bed mobility comments: Pt requires min assist & extra time to push trunk upright to sit EOB. ?  ? ?Transfers ?Overall transfer level: Needs assistance ?Equipment used: Rolling walker (2 wheels) ?Transfers: Sit to/from Stand ?Sit to Stand: Min guard, Min assist ?  ?  ?  ?  ?  ?General transfer comment: Pt with BUE on RW vs pushing to stand but also with decreased vision so it may help her to have BUE already positioned on RW as she is able to power up with CGA<>min assist with BLEs. ?  ? ?Ambulation/Gait ?Ambulation/Gait assistance: Min assist, Min guard ?Gait Distance (Feet): 15 Feet ?Assistive device: Rolling walker (2 wheels) ?Gait Pattern/deviations: Decreased step length -  right, Decreased dorsiflexion - left, Decreased step length - left ?Gait velocity: decreased ?  ?  ?General Gait Details: Pt ambulates to door & back of room with RW with CGA, PT managing items/lines for safety. ? ? ?Stairs ?  ?  ?   ?  ?  ? ? ?Wheelchair Mobility ?  ? ?Modified Rankin (Stroke Patients Only) ?  ? ? ?  ?Balance Overall balance assessment: Needs assistance ?Sitting-balance support: Feet unsupported ?Sitting balance-Leahy Scale: Good ?Sitting balance - Comments: Pt able to bend over & don new socks with CGA ?  ?Standing balance support: Bilateral upper extremity supported, During functional activity ?Standing balance-Leahy Scale: Fair ?  ?  ?  ?  ?  ?  ?  ?  ?  ?  ?  ?  ?  ? ?  ?Cognition Arousal/Alertness: Awake/alert ?Behavior During Therapy: Impulsive, WFL for tasks assessed/performed ?Overall Cognitive Status: Within Functional Limits for tasks assessed ?  ?  ?  ?  ?  ?  ?  ?  ?  ?  ?  ?  ?  ?  ?  ?  ?General Comments: Pt continues to be impulsive with all mobility, does better after PT educates her on need/importance of slowing down. ?  ?  ? ?  ?Exercises   ? ?  ?General Comments   ?  ?  ? ?Pertinent Vitals/Pain Pain Assessment ?Pain Assessment: No/denies pain  ? ? ?Home Living   ?  ?  ?  ?  ?  ?  ?  ?  ?  ?   ?  ?Prior Function    ?  ?  ?   ? ?PT Goals (current goals can now be found in the care plan section) Acute Rehab PT Goals ?Patient Stated Goal: get better ?PT Goal Formulation: With patient ?Time For Goal Achievement: 05/29/21 ?Potential to Achieve Goals: Good ?Progress towards PT goals: Progressing toward goals ? ?  ?Frequency ? ? ? 7X/week ? ? ? ?  ?PT Plan Current plan remains appropriate  ? ? ?Co-evaluation   ?  ?  ?  ?  ? ?  ?AM-PAC PT "6 Clicks" Mobility   ?Outcome Measure ? Help needed turning from your back to your side while in a flat bed without using bedrails?: None ?Help needed moving from lying on your back to sitting on the side of a flat bed without using bedrails?: A Little ?Help needed moving to and from a bed to a chair (including a wheelchair)?: A Little ?Help needed standing up from a chair using your arms (e.g., wheelchair or bedside chair)?: A Little ?Help needed to walk in hospital room?: A  Little ?Help needed climbing 3-5 steps with a railing? : A Lot ?6 Click Score: 18 ? ?  ?End of Session Equipment Utilized During Treatment: Gait belt;Oxygen ?Activity Tolerance: Patient tolerated treatment well ?Patient left: in chair;with chair alarm set;with call bell/phone within reach ?Nurse Communication: Mobility status ?PT Visit Diagnosis: Unsteadiness on feet (R26.81);Muscle weakness (generalized) (M62.81);Difficulty in walking, not elsewhere classified (R26.2);History of falling (Z91.81);Hemiplegia and hemiparesis ?Hemiplegia - Right/Left: Left ?Hemiplegia - dominant/non-dominant: Non-dominant ?Hemiplegia - caused by: Cerebral infarction ?  ? ? ?Time: [id number] ?PT Time Calculation (min) (ACUTE ONLY): 31 min ? ?Charges:  $Therapeutic Activity: 23-37 mins          ?          ? ?Lavone Nian, PT, DPT ?05/16/21, 12:50 PM ? ? ? ?Waunita Schooner ?05/16/2021,  12:47 PM ? ?

## 2021-05-16 NOTE — Assessment & Plan Note (Signed)
Give venofer 200mg  IV x1 on 3/5, continue routine hematology follow up. Hgb is low at 7.5 but this is stable without gross bleeding.  ?

## 2021-05-16 NOTE — Discharge Summary (Signed)
Physician Discharge Summary   Patient: Judy Harvey MRN: [id number] DOB: Oct 11, 1927  Admit date:     05/14/2021  Discharge date: 05/16/21  Discharge Physician: Patrecia Pour   PCP: Leonel Ramsay, MD   Recommendations at discharge:  Follow up with neurology in 6-8 weeks for post stroke follow up Follow up with hematology per routine for iron deficiency anemia. Given IV iron on 3/5 since she missed outpatient dose 3/3.  Outpatient palliative care services to continue following. Note she had an oxygen concentrator at home, we've confirmed need for supplemental oxygen and have ordered it.  Discharge Diagnoses: Principal Problem:   Acute CVA (cerebrovascular accident) [facility]) Active Problems:   Left sided numbness   Benign essential HTN   Type 2 diabetes mellitus without complication, with long-term current use of insulin (HCC)   Macular degeneration   Iron deficiency anemia   Legally blind   Hyperlipidemia   Acute respiratory failure with hypoxia [facility])  Hospital Course: Judy Harvey is a 88 y.o. 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 After negative CT, an MRI of the brain revealed an acute subcentimeter right parietal infarct. MRA showed right ICA occlusion and high-grade stenosis of right ACA A1 segment and anterior communicating artery. Subsequent CTA head and neck confirmed right ICA stenosis, left ICA stent, though it refuted the stenosis suggested in the right A1 segment and anterior communicating artery.and additionally moderate/severe distal right M1 segment of the MCA. Echocardiogram revealed no cardioembolic source, and cardiac monitoring has yet to reveal significant dysrhythmia. Neurology was consulted, guiding work up, and recommends  reinitiation of DAPT and high intensity statin with plans for neurology follow up.    On 3/4, the patient has been placed on oxygen and treated with bronchodilator. CXR without infiltrate, PCT negative. Her respiratory status is at her baseline, so supplemental oxygen (as was ordered for home previously) is continued.    Assessment and Plan: * Acute CVA (cerebrovascular accident) ([place]) Acute right parietal white matter infarct. Additionally noted is right ICA occlusion and moderate-severe right distal M1 stenosis. No cardioembolic source or evidence of AFib at this time. - PT/OT/SLP consulted as pt still has left-sided numbness > will benefit from home health therapies, ordered. - Continue aspirin 81mg  daily, plavix 75mg  daily, atorvastatin 80mg  daily per neurology recommendations, discussed with Dr. Quinn Axe.  Left sided numbness Due to CVA, stable.   Benign essential HTN - Will hold home norvasc 5mg  since she's normotensive currently and per neurology it is imperative to avoid hypotension due to intracranial atherosclerosis.   Type 2 diabetes mellitus without complication, with long-term current use of insulin (HCC) CBGs have been at goal despite holding home metformin and lantus 50u qHS. She will be told not to take insulin unless her blood sugar begins to rise after discharge. Previously HbA1c 8.1%.  Macular degeneration Severe visual impairment, accommodations should be made as much as possible.    Iron deficiency anemia Give venofer 200mg  IV x1 on 3/5, continue routine hematology follow up. Hgb is low at 7.5 but this is stable without gross bleeding.   Acute respiratory failure with hypoxia (HCC) Pt denied that she'd ever needed oxygen before but she has concentrator at home per Palliative. At any rate, she doesn't wear oxygen at baseline but  has mild exertional hypoxemia despite no dyspnea and nonlabored respiratory effort. - CXR without infiltrate, she's not clinically volume  overloaded, and has no other signs or symptoms to suggest PE at this time. No wheezing. We will order O2 for home, suspect she may chronically be a bit hypoxemic. Continue home bronchodilator.  Consultants: Neurology Procedures performed: None  Disposition: Home with [place] and son's assistance. Diet recommendation:  Cardiac and Carb modified diet  DISCHARGE MEDICATION: Allergies as of 05/16/2021       Reactions   Adhesive [tape] Other (See Comments)   "pulls my skin off" paper tape ok   Lyrica [pregabalin]    Body spasms        Medication List     STOP taking these medications    amLODipine 5 MG tablet Commonly known as: NORVASC       TAKE these medications    acetaminophen 325 MG tablet Commonly known as: TYLENOL Take 650 mg by mouth every 6 (six) hours as needed for mild pain or fever.   albuterol 108 (90 Base) MCG/ACT inhaler Commonly known as: VENTOLIN HFA Inhale 2 puffs into the lungs every 6 (six) hours as needed for shortness of breath or wheezing.   albuterol 1.25 MG/3ML nebulizer solution Commonly known as: ACCUNEB Take 3 mLs by nebulization every 6 (six) hours as needed for wheezing or shortness of breath.   aspirin EC 81 MG tablet Take 81 mg by mouth daily.   atorvastatin 80 MG tablet Commonly known as: LIPITOR TAKE 1 TABLET BY MOUTH EVERYDAY AT BEDTIME What changed:  how much to take how to take this when to take this additional instructions   clobetasol ointment 0.05 % Commonly known as: TEMOVATE Apply 1 application topically 2 (two) times daily.   clopidogrel 75 MG tablet Commonly known as: PLAVIX Take 1 tablet by mouth daily. TAKE 1 TABLET BY MOUTH EVERY DAY What changed: Another medication with the same name was removed. Continue taking this medication, and follow the directions you see here.   DULoxetine 30 MG capsule Commonly known as: CYMBALTA TAKE 1 CAPSULE BY MOUTH EVERY DAY What changed:  how much to take how to take this when  to take this additional instructions Another medication with the same name was removed. Continue taking this medication, and follow the directions you see here.   ferrous sulfate 325 (65 FE) MG tablet Take 325 mg by mouth daily.   gabapentin 300 MG capsule Commonly known as: NEURONTIN Take 300 mg by mouth daily. For 90 days What changed: Another medication with the same name was removed. Continue taking this medication, and follow the directions you see here.   insulin glargine 100 UNIT/ML injection Commonly known as: Lantus Inject 0.4 mLs (40 Units total) into the skin daily. INJECT 50 UNITS INTO THE SKIN DAILY AT 10 PM.   Insulin Syringe-Needle U-100 31G X 5/16" 0.3 ML Misc Commonly known as: B-D INS SYR ULTRAFINE .3CC/31G Use to administer insulin daily to control diabetes. 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   Melatonin 3 MG Caps Take by mouth. Take 3 mg by mouth at bedtime   metFORMIN 500 MG 24 hr tablet Commonly known as: GLUCOPHAGE-XR Take 500 mg by mouth 2 (two) times daily.   mirtazapine 7.5 MG tablet Commonly known as: REMERON Take by mouth. Take 1 tablet (7.5 mg total) by mouth at bedtime What changed: Another medication with the same name was removed. Continue taking this medication, and follow the  directions you see here.   nystatin powder Commonly known as: MYCOSTATIN/NYSTOP Apply topically 2 (two) times daily. Apply topically 2 (two) times daily What changed: Another medication with the same name was removed. Continue taking this medication, and follow the directions you see here.   ondansetron 8 MG tablet Commonly known as: ZOFRAN Take 8 mg by mouth every 8 (eight) hours as needed for nausea or vomiting.   pantoprazole 40 MG tablet Commonly known as: PROTONIX Take by mouth. Take 1 tablet (40 mg total) by mouth 2 (two) times daily before meals What changed: Another medication with the same name was removed. Continue taking  this medication, and follow the directions you see here.   promethazine 25 MG tablet Commonly known as: PHENERGAN Take 25 mg by mouth every 8 (eight) hours as needed.   traZODone 50 MG tablet Commonly known as: DESYREL Take 50 mg by mouth at bedtime.   Vitamin D3 125 MCG (5000 UT) Caps Take 1 capsule (5,000 Units total) by mouth daily.               Durable Medical Equipment  (From admission, onward)           Start     Ordered   05/16/21 0830  For home use only DME oxygen  Once       Question Answer Comment  Length of Need 6 Months   Mode or (Route) Nasal cannula   Liters per Minute 2   Frequency Continuous (stationary and portable oxygen unit needed)   Oxygen delivery system Gas      05/16/21 0829            Follow-up Information     Leonel Ramsay, MD Follow up.   Specialty: Infectious Diseases Contact information: [place] [address] [phone]         Earlie Server, MD Follow up.   Specialty: Oncology Contact information: [place] [address] [place]. Schedule an appointment as soon as possible for a visit in 2 month(s).   Contact information: [place] [phone]               Subjective: No complaints, no new weakness or numbness. Denies shortness of breath at all, says she doesn't need any oxygen. No chest pain, palpitations, new leg swelling or orthopnea or wheezing. Discharge Exam: BP 138/65 (BP Location: Right Arm)    Pulse 71    Temp 97.7 F (36.5 C) (Oral)    Resp 20    Ht 5\' 2"  (1.575 m)    Wt 73 kg    SpO2 99%    BMI 29.45 kg/m   Pleasant, frail elderly female in no distress Clear, nonlabored RRR, no asymmetric LE edema Alert, oriented, drastically diminished visual acuity bilaterally, mildly HOH, +left UE and LE numbness without focal weakness.   Condition at  discharge: stable  The results of significant diagnostics from this hospitalization (including imaging, microbiology, ancillary and laboratory) are listed below for reference.   Imaging Studies: CT ANGIO HEAD NECK W WO CM  Result Date: 05/15/2021 CLINICAL DATA:  Provided history: Neuro deficit, acute, stroke suspected. Further eval of ACA stenosis seen on MRA. Numbness. EXAM: CT ANGIOGRAPHY HEAD AND NECK TECHNIQUE: Multidetector CT imaging of the head and neck was performed using the standard protocol during bolus administration of intravenous contrast. Multiplanar CT  image reconstructions and MIPs were obtained to evaluate the vascular anatomy. Carotid stenosis measurements (when applicable) are obtained utilizing NASCET criteria, using the distal internal carotid diameter as the denominator. RADIATION DOSE REDUCTION: This exam was performed according to the departmental dose-optimization program which includes automated exposure control, adjustment of the mA and/or kV according to patient size and/or use of iterative reconstruction technique. CONTRAST:  63mL OMNIPAQUE IOHEXOL 350 MG/ML SOLN COMPARISON:  MRA head and MRI brain 05/14/2021. FINDINGS: CT HEAD FINDINGS Brain: Mild generalized cerebral and cerebellar atrophy. A small acute/early subacute infarct within the right parietal lobe was better appreciated on the brain MRI of 05/14/2021. Redemonstrated small chronic cortical/subcortical infarct within the right parietal lobe (for instance as seen on series 4, image 24). Background moderate/advanced patchy and ill-defined hypoattenuation within the cerebral white matter, nonspecific but compatible with chronic small vessel ischemic disease. Known chronic lacunar infarct within the left thalamus, better appreciated on the prior MRI. There is no acute intracranial hemorrhage. No extra-axial fluid collection. No evidence of an intracranial mass. No midline shift. Vascular: No hyperdense vessel.   Atherosclerotic calcifications. Skull: Normal. Negative for fracture or focal lesion. Sinuses: Partial opacification of a posterior right ethmoid air cell. Orbits: No orbital mass or acute orbital finding. Review of the MIP images confirms the above findings CTA NECK FINDINGS Aortic arch: Standard aortic branching. Atherosclerotic plaque within the visualized aortic arch and proximal major branch vessels of the neck. No hemodynamically significant innominate or proximal subclavian artery stenosis. Right carotid system: The common carotid artery is patent within the neck. Atherosclerotic plaque about the carotid bifurcation. The right ICA is occluded beginning at its origin and remains occluded throughout the remainder of the neck. Left carotid system: CCA and ICA patent within the neck. A stent traverses the distal common carotid artery and proximal internal carotid artery. Stent artifact limits evaluation for intra stent stenosis. However, the stent appears patent. No hemodynamically significant stenosis elsewhere within the left common carotid or internal carotid artery. Vertebral arteries: Vertebral arteries codominant and patent within the neck without hemodynamically significant stenosis. Mild atherosclerotic plaque within the left V1, left V2 and bilateral V3 vertebral arteries. Skeleton: Cervical spondylosis. No acute bony abnormality or aggressive osseous lesion. Cervical spondylosis. Other neck: No neck mass or cervical lymphadenopathy. Upper chest: No consolidation within the imaged lung apices. Atelectasis within the dependent aspect of the right upper lobe. Review of the MIP images confirms the above findings CTA HEAD FINDINGS Anterior circulation: The right internal carotid artery remains occluded intracranially. The M1 middle cerebral arteries are patent. There is an apparent moderate/severe stenosis within the distal M1 right MCA (series 13, image 50). Atherosclerotic irregularity of the M2 and more  distal middle cerebral arteries. No M2 proximal branch occlusion is identified. The anterior cerebral arteries are patent. There is no appreciable stenosis within the A1 right ACA or anterior communicating artery, as was questioned on the recent prior MRA head. No intracranial aneurysm is identified. Posterior circulation: The intracranial vertebral arteries are patent. The basilar artery is patent. The posterior cerebral arteries are patent. Posterior communicating arteries are diminutive or absent bilaterally. Venous sinuses: Within the limitations of contrast timing, no convincing thrombus. Anatomic variants: As described. Review of the MIP images confirms the above findings IMPRESSION: CT head: 1. A small acute/early subacute infarct within the right parietal lobe was better appreciated on the prior brain MRI of 05/14/2021. 2. Redemonstrated small chronic cortical/subcortical infarct within the right parietal lobe. 3. Background moderate/advanced chronic small  vessel ischemic changes within the cerebral white matter. 4. Redemonstrated chronic lacunar infarct within the left thalamus. 5. Mild generalized parenchymal atrophy. 6. Right ethmoid sinusitis. CTA neck: 1. The right internal carotid artery is occluded beginning at its origin and remains occluded throughout the remainder of the neck. 2. A stent traverses the distal left common carotid artery and proximal left internal carotid artery. Stent artifact limits evaluation for intra-stent stenosis. However, the stent appears patent. No hemodynamically significant stenosis elsewhere within the left common or internal carotid arteries within the neck. 3. Vertebral arteries codominant and patent within the neck without hemodynamically significant stenosis. Mild atherosclerotic plaque within the left V1, left V2 and bilateral V3 vertebral arteries. CTA head: 1. The right internal carotid artery remains occluded intracranially. 2. Additional intracranial  atherosclerotic disease, as described. Most notably, there is an apparent moderate/severe stenosis within the distal M1 segment of the right middle cerebral artery. 3. No appreciable stenosis within the right A1 segment or anterior communicating artery, as was questioned on the recent prior MRA head. Electronically Signed   By: Kellie Simmering D.O.   On: 05/15/2021 14:57   DG Chest 2 View  Result Date: 05/15/2021 CLINICAL DATA:  Respiratory failure. EXAM: CHEST - 2 VIEW COMPARISON:  October 25, 2020 FINDINGS: Evaluation is limited by rotation. The cardiomediastinal silhouette is unchanged and enlarged in contour. Small bilateral pleural effusions. No large pneumothorax. Mild peribronchial cuffing mild reticular prominence. Visualized abdomen is unremarkable. Remote RIGHT-sided rib fractures. Hiatal hernia. IMPRESSION: Constellation of findings are favored to reflect mild pulmonary edema with small bilateral pleural effusions. Electronically Signed   By: Valentino Saxon M.D.   On: 05/15/2021 17:15   CT HEAD WO CONTRAST  Result Date: 05/14/2021 CLINICAL DATA:  Left-sided numbness and tingling since this morning, left leg weakness EXAM: CT HEAD WITHOUT CONTRAST TECHNIQUE: Contiguous axial images were obtained from the base of the skull through the vertex without intravenous contrast. RADIATION DOSE REDUCTION: This exam was performed according to the departmental dose-optimization program which includes automated exposure control, adjustment of the mA and/or kV according to patient size and/or use of iterative reconstruction technique. COMPARISON:  11/24/2019 FINDINGS: Brain: Stable hypodensities throughout the periventricular white matter consistent with chronic small vessel ischemic change. No evidence of acute infarct or hemorrhage. Lateral ventricles and midline structures are unremarkable. No acute extra-axial fluid collections. No mass effect. Vascular: No hyperdense vessel or unexpected calcification. Stable  atherosclerosis. Skull: Normal. Negative for fracture or focal lesion. Sinuses/Orbits: No acute finding. Other: None. IMPRESSION: 1. Stable head CT, no acute intracranial process. Electronically Signed   By: Randa Ngo M.D.   On: 05/14/2021 15:27   MR ANGIO HEAD WO CONTRAST  Result Date: 05/14/2021 CLINICAL DATA:  Left-sided numbness and tingling EXAM: MRI HEAD WITHOUT CONTRAST MRA HEAD WITHOUT CONTRAST TECHNIQUE: Multiplanar, multi-echo pulse sequences of the brain and surrounding structures were acquired without intravenous contrast. Angiographic images of the Circle of Willis were acquired using MRA technique without intravenous contrast. COMPARISON:  No pertinent prior exam. FINDINGS: MRI HEAD FINDINGS Brain: 8 diffusion abnormality in the right parietal white matter, possibly subacute ischemia. No other diffusion abnormality. Focus of chronic microhemorrhage in the right cerebellum. Hyperintense T2-weighted signal is moderately widespread throughout the white matter. Generalized volume loss without a clear lobar predilection. The midline structures are normal. Vascular: Abnormal right ICA flow void Skull and upper cervical spine: Normal calvarium and skull base. Visualized upper cervical spine and soft tissues are normal. Sinuses/Orbits:No paranasal sinus fluid  levels or advanced mucosal thickening. No mastoid or middle ear effusion. Normal orbits. MRA HEAD FINDINGS POSTERIOR CIRCULATION: --Vertebral arteries: Normal --Inferior cerebellar arteries: Normal. --Basilar artery: Normal. --Superior cerebellar arteries: Normal. --Posterior cerebral arteries: Normal. ANTERIOR CIRCULATION: --Intracranial internal carotid arteries: The right ICA is occluded. --Anterior cerebral arteries (ACA): Anterior communicating artery appears discontinuous, but flow related enhancement in the right MCA suggested is patent. Otherwise normal anterior cerebral arteries. --Middle cerebral arteries (MCA): Normal. ANATOMIC  VARIANTS: None IMPRESSION: 1. Small, subcentimeter focus of acute/subacute ischemia in the right parietal white matter. No hemorrhage or mass effect. 2. Occlusion of the right ICA. 3. Likely high grade stenosis of the right ACA distal A1 segment and anterior communicating artery. This could be confirmed with CTA if clinically indicated. 4. Chronic small vessel disease and volume loss. Electronically Signed   By: Ulyses Jarred M.D.   On: 05/14/2021 22:43   MR BRAIN WO CONTRAST  Result Date: 05/14/2021 CLINICAL DATA:  Left-sided numbness and tingling EXAM: MRI HEAD WITHOUT CONTRAST MRA HEAD WITHOUT CONTRAST TECHNIQUE: Multiplanar, multi-echo pulse sequences of the brain and surrounding structures were acquired without intravenous contrast. Angiographic images of the Circle of Willis were acquired using MRA technique without intravenous contrast. COMPARISON:  No pertinent prior exam. FINDINGS: MRI HEAD FINDINGS Brain: 8 diffusion abnormality in the right parietal white matter, possibly subacute ischemia. No other diffusion abnormality. Focus of chronic microhemorrhage in the right cerebellum. Hyperintense T2-weighted signal is moderately widespread throughout the white matter. Generalized volume loss without a clear lobar predilection. The midline structures are normal. Vascular: Abnormal right ICA flow void Skull and upper cervical spine: Normal calvarium and skull base. Visualized upper cervical spine and soft tissues are normal. Sinuses/Orbits:No paranasal sinus fluid levels or advanced mucosal thickening. No mastoid or middle ear effusion. Normal orbits. MRA HEAD FINDINGS POSTERIOR CIRCULATION: --Vertebral arteries: Normal --Inferior cerebellar arteries: Normal. --Basilar artery: Normal. --Superior cerebellar arteries: Normal. --Posterior cerebral arteries: Normal. ANTERIOR CIRCULATION: --Intracranial internal carotid arteries: The right ICA is occluded. --Anterior cerebral arteries (ACA): Anterior communicating  artery appears discontinuous, but flow related enhancement in the right MCA suggested is patent. Otherwise normal anterior cerebral arteries. --Middle cerebral arteries (MCA): Normal. ANATOMIC VARIANTS: None IMPRESSION: 1. Small, subcentimeter focus of acute/subacute ischemia in the right parietal white matter. No hemorrhage or mass effect. 2. Occlusion of the right ICA. 3. Likely high grade stenosis of the right ACA distal A1 segment and anterior communicating artery. This could be confirmed with CTA if clinically indicated. 4. Chronic small vessel disease and volume loss. Electronically Signed   By: Ulyses Jarred M.D.   On: 05/14/2021 22:43   ECHOCARDIOGRAM COMPLETE  Result Date: 05/15/2021    ECHOCARDIOGRAM REPORT   Patient Name:   TRESSIA GOLIS Date of Exam: 05/15/2021 Medical Rec #:  [id number]         Height:       62.0 in Accession #:    [id number]        Weight:       161.0 lb Date of Birth:  October 21, 1927         BSA:          1.743 m Patient Age:    35 years          BP:           103/83 mmHg Patient Gender: F                 HR:  70 bpm. Exam Location:  [facility] Procedure: 2D Echo, Cardiac Doppler and Color Doppler Indications:     Stroke I63.9  History:         Patient has no prior history of Echocardiogram examinations.                  Risk Factors:Hypertension and Diabetes.  Sonographer:     Alyse Low Roar Referring Phys:  Tok Diagnosing Phys: Ida Rogue MD IMPRESSIONS  1. Left ventricular ejection fraction, by estimation, is 60 to 65%. The left ventricle has normal function. The left ventricle has no regional wall motion abnormalities. There is mild left ventricular hypertrophy. Left ventricular diastolic parameters are consistent with Grade II diastolic dysfunction (pseudonormalization).  2. Right ventricular systolic function is normal. The right ventricular size is normal. There is mildly elevated pulmonary artery systolic pressure.  3. The mitral valve is normal in  structure. Mild mitral valve regurgitation. No evidence of mitral stenosis.  4. The aortic valve is normal in structure. Aortic valve regurgitation is not visualized. No aortic stenosis is present.  5. The inferior vena cava is normal in size with greater than 50% respiratory variability, suggesting right atrial pressure of 3 mmHg. FINDINGS  Left Ventricle: Left ventricular ejection fraction, by estimation, is 60 to 65%. The left ventricle has normal function. The left ventricle has no regional wall motion abnormalities. The left ventricular internal cavity size was normal in size. There is  mild left ventricular hypertrophy. Left ventricular diastolic parameters are consistent with Grade II diastolic dysfunction (pseudonormalization). Right Ventricle: The right ventricular size is normal. No increase in right ventricular wall thickness. Right ventricular systolic function is normal. There is mildly elevated pulmonary artery systolic pressure. The tricuspid regurgitant velocity is 2.93  m/s, and with an assumed right atrial pressure of 5 mmHg, the estimated right ventricular systolic pressure is 99991111 mmHg. Left Atrium: Left atrial size was normal in size. Right Atrium: Right atrial size was normal in size. Pericardium: There is no evidence of pericardial effusion. Mitral Valve: The mitral valve is normal in structure. Mild mitral valve regurgitation. No evidence of mitral valve stenosis. Tricuspid Valve: The tricuspid valve is normal in structure. Tricuspid valve regurgitation is mild . No evidence of tricuspid stenosis. Aortic Valve: The aortic valve is normal in structure. Aortic valve regurgitation is not visualized. No aortic stenosis is present. Aortic valve mean gradient measures 9.5 mmHg. Aortic valve peak gradient measures 21.4 mmHg. Aortic valve area, by VTI measures 1.51 cm. Pulmonic Valve: The pulmonic valve was normal in structure. Pulmonic valve regurgitation is not visualized. No evidence of pulmonic  stenosis. Aorta: The aortic root is normal in size and structure. Venous: The inferior vena cava is normal in size with greater than 50% respiratory variability, suggesting right atrial pressure of 3 mmHg. IAS/Shunts: The interatrial septum was not assessed.  LEFT VENTRICLE PLAX 2D LVIDd:         5.23 cm   Diastology LVIDs:         3.68 cm   LV e' medial:    4.79 cm/s LV PW:         1.15 cm   LV E/e' medial:  29.2 LV IVS:        1.15 cm   LV e' lateral:   5.77 cm/s LVOT diam:     1.90 cm   LV E/e' lateral: 24.3 LV SV:         75 LV SV Index:  43 LVOT Area:     2.84 cm  RIGHT VENTRICLE RV Basal diam:  3.33 cm RV Mid diam:    2.80 cm RV S prime:     10.90 cm/s TAPSE (M-mode): 2.1 cm LEFT ATRIUM             Index        RIGHT ATRIUM          Index LA diam:        3.70 cm 2.12 cm/m   RA Area:     9.53 cm LA Vol (A2C):   50.2 ml 28.80 ml/m  RA Volume:   16.50 ml 9.46 ml/m LA Vol (A4C):   39.0 ml 22.37 ml/m LA Biplane Vol: 44.9 ml 25.76 ml/m  AORTIC VALVE                     PULMONIC VALVE AV Area (Vmax):    1.41 cm      PV Vmax:        0.94 m/s AV Area (Vmean):   1.57 cm      PV Peak grad:   3.5 mmHg AV Area (VTI):     1.51 cm      RVOT Peak grad: 3 mmHg AV Vmax:           231.50 cm/s AV Vmean:          136.500 cm/s AV VTI:            0.492 m AV Peak Grad:      21.4 mmHg AV Mean Grad:      9.5 mmHg LVOT Vmax:         115.50 cm/s LVOT Vmean:        75.800 cm/s LVOT VTI:          0.263 m LVOT/AV VTI ratio: 0.53  AORTA Ao Root diam: 2.60 cm Ao Asc diam:  2.70 cm MITRAL VALVE                TRICUSPID VALVE MV Area (PHT): 3.34 cm     TR Peak grad:   34.3 mmHg MV Decel Time: 227 msec     TR Vmax:        293.00 cm/s MV E velocity: 140.00 cm/s MV A velocity: 124.00 cm/s  SHUNTS MV E/A ratio:  1.13         Systemic VTI:  0.26 m MV A Prime:    7.7 cm/s     Systemic Diam: 1.90 cm Ida Rogue MD Electronically signed by Ida Rogue MD Signature Date/Time: 05/15/2021/2:12:30 PM    Final     Microbiology: Results  for orders placed or performed during the hospital encounter of 05/14/21  Resp Panel by RT-PCR (Flu A&B, Covid) Nasopharyngeal Swab     Status: None   Collection Time: 05/14/21  9:17 PM   Specimen: Nasopharyngeal Swab; Nasopharyngeal(NP) swabs in vial transport medium  Result Value Ref Range Status   SARS Coronavirus 2 by RT PCR NEGATIVE NEGATIVE Final    Comment: (NOTE) SARS-CoV-2 target nucleic acids are NOT DETECTED.  The SARS-CoV-2 RNA is generally detectable in upper respiratory specimens during the acute phase of infection. The lowest concentration of SARS-CoV-2 viral copies this assay can detect is 138 copies/mL. A negative result does not preclude SARS-Cov-2 infection and should not be used as the sole basis for treatment or other patient management decisions. A negative result may occur with  improper specimen collection/handling, submission of specimen  other than nasopharyngeal swab, presence of viral mutation(s) within the areas targeted by this assay, and inadequate number of viral copies(<138 copies/mL). A negative result must be combined with clinical observations, patient history, and epidemiological information. The expected result is Negative.  Fact Sheet for Patients:  EntrepreneurPulse.com.au  Fact Sheet for Healthcare Providers:  IncredibleEmployment.be  This test is no t yet approved or cleared by the [place] FDA and  has been authorized for detection and/or diagnosis of SARS-CoV-2 by FDA under an Emergency Use Authorization (EUA). This EUA will remain  in effect (meaning this test can be used) for the duration of the COVID-19 declaration under Section 564(b)(1) of the Act, 21 U.S.C.section 360bbb-3(b)(1), unless the authorization is terminated  or revoked sooner.       Influenza A by PCR NEGATIVE NEGATIVE Final   Influenza B by PCR NEGATIVE NEGATIVE Final    Comment: (NOTE) The Xpert Xpress SARS-CoV-2/FLU/RSV plus  assay is intended as an aid in the diagnosis of influenza from Nasopharyngeal swab specimens and should not be used as a sole basis for treatment. Nasal washings and aspirates are unacceptable for Xpert Xpress SARS-CoV-2/FLU/RSV testing.  Fact Sheet for Patients: EntrepreneurPulse.com.au  Fact Sheet for Healthcare Providers: IncredibleEmployment.be  This test is not yet approved or cleared by the [place] FDA and has been authorized for detection and/or diagnosis of SARS-CoV-2 by FDA under an Emergency Use Authorization (EUA). This EUA will remain in effect (meaning this test can be used) for the duration of the COVID-19 declaration under Section 564(b)(1) of the Act, 21 U.S.C. section 360bbb-3(b)(1), unless the authorization is terminated or revoked.  Performed at [facility] [address] [place], [place] [address]     Labs: CBC: Recent Labs  Lab 05/14/21 1450 05/16/21 0629  WBC 10.0 7.9  NEUTROABS 8.0*  --   HGB 8.3* 7.5*  HCT 29.5* 26.7*  MCV 89.7 90.2  PLT 322 0000000   Basic Metabolic Panel: Recent Labs  Lab 05/14/21 1450 05/16/21 0629  NA 139 144  K 3.3* 4.1  CL 102 106  CO2 29 31  GLUCOSE 152* 88  BUN 25* 22  CREATININE 1.02* 1.00  CALCIUM 8.5* 8.3*  MG  --  2.2   Liver Function Tests: Recent Labs  Lab 05/14/21 1450  AST 12*  ALT 8  ALKPHOS 70  BILITOT 0.3  PROT 6.4*  ALBUMIN 3.0*   CBG: Recent Labs  Lab 05/14/21 1449 05/15/21 0151 05/15/21 1604 05/15/21 2032 05/16/21 0727  GLUCAP 116* 143* 143* 153* 89    Discharge time spent: greater than 30 minutes.  Signed: Patrecia Pour, MD [facility] 05/16/2021

## 2021-05-17 ENCOUNTER — Telehealth: Payer: Self-pay

## 2021-05-17 LAB — HEMOGLOBIN A1C
Hgb A1c MFr Bld: 6.8 % — ABNORMAL HIGH (ref 4.8–5.6)
Mean Plasma Glucose: 148 mg/dL

## 2021-05-17 NOTE — Telephone Encounter (Signed)
430 pm.  Phone call made to son to follow up on recent hospital discharge and home health services.  Judy Harvey was able to provide the name of [facility] who was providing an home aide.  Phone call made to [facility] and spoke with Angie to advise of patient's recent hospitalization and to confirm services.   Angie confirmed patient is active with them and is receiving PT/SN visits.  Advised of diabetes test strips being outdated from 2018 and that [name], NP has ordered new strips.  Discharge summary also faxed to agency for review.  Angie advised they will need to obtain orders for "resume care orders" in order to see patient again.  Their last visit was on 05/13/21. ? ?Update [name], NP on above.  ?

## 2021-05-18 ENCOUNTER — Encounter: Payer: Self-pay | Admitting: Emergency Medicine

## 2021-05-18 ENCOUNTER — Other Ambulatory Visit: Payer: Self-pay

## 2021-05-18 ENCOUNTER — Inpatient Hospital Stay
Admission: EM | Admit: 2021-05-18 | Discharge: 2021-05-22 | DRG: 178 | Disposition: A | Discharge status: Home Health | Payer: Medicare Other | Attending: Internal Medicine | Admitting: Internal Medicine

## 2021-05-18 DIAGNOSIS — Z7982 Long term (current) use of aspirin: Secondary | ICD-10-CM

## 2021-05-18 DIAGNOSIS — Z79899 Other long term (current) drug therapy: Secondary | ICD-10-CM

## 2021-05-18 DIAGNOSIS — Z9981 Dependence on supplemental oxygen: Secondary | ICD-10-CM

## 2021-05-18 DIAGNOSIS — K219 Gastro-esophageal reflux disease without esophagitis: Secondary | ICD-10-CM | POA: Diagnosis present

## 2021-05-18 DIAGNOSIS — I69398 Other sequelae of cerebral infarction: Secondary | ICD-10-CM

## 2021-05-18 DIAGNOSIS — E119 Type 2 diabetes mellitus without complications: Secondary | ICD-10-CM

## 2021-05-18 DIAGNOSIS — Z79890 Hormone replacement therapy (postmenopausal): Secondary | ICD-10-CM

## 2021-05-18 DIAGNOSIS — Z820 Family history of epilepsy and other diseases of the nervous system: Secondary | ICD-10-CM

## 2021-05-18 DIAGNOSIS — R1310 Dysphagia, unspecified: Secondary | ICD-10-CM | POA: Diagnosis present

## 2021-05-18 DIAGNOSIS — Z794 Long term (current) use of insulin: Secondary | ICD-10-CM

## 2021-05-18 DIAGNOSIS — D509 Iron deficiency anemia, unspecified: Secondary | ICD-10-CM | POA: Diagnosis present

## 2021-05-18 DIAGNOSIS — H3530 Unspecified macular degeneration: Secondary | ICD-10-CM | POA: Diagnosis present

## 2021-05-18 DIAGNOSIS — J690 Pneumonitis due to inhalation of food and vomit: Principal | ICD-10-CM | POA: Diagnosis present

## 2021-05-18 DIAGNOSIS — R7989 Other specified abnormal findings of blood chemistry: Secondary | ICD-10-CM

## 2021-05-18 DIAGNOSIS — Z6832 Body mass index (BMI) 32.0-32.9, adult: Secondary | ICD-10-CM

## 2021-05-18 DIAGNOSIS — R200 Anesthesia of skin: Secondary | ICD-10-CM | POA: Diagnosis present

## 2021-05-18 DIAGNOSIS — Z7902 Long term (current) use of antithrombotics/antiplatelets: Secondary | ICD-10-CM

## 2021-05-18 DIAGNOSIS — Z8673 Personal history of transient ischemic attack (TIA), and cerebral infarction without residual deficits: Secondary | ICD-10-CM

## 2021-05-18 DIAGNOSIS — Z8719 Personal history of other diseases of the digestive system: Secondary | ICD-10-CM

## 2021-05-18 DIAGNOSIS — J449 Chronic obstructive pulmonary disease, unspecified: Secondary | ICD-10-CM

## 2021-05-18 DIAGNOSIS — R778 Other specified abnormalities of plasma proteins: Secondary | ICD-10-CM

## 2021-05-18 DIAGNOSIS — J189 Pneumonia, unspecified organism: Secondary | ICD-10-CM

## 2021-05-18 DIAGNOSIS — Z833 Family history of diabetes mellitus: Secondary | ICD-10-CM

## 2021-05-18 DIAGNOSIS — E669 Obesity, unspecified: Secondary | ICD-10-CM | POA: Diagnosis present

## 2021-05-18 DIAGNOSIS — J9611 Chronic respiratory failure with hypoxia: Secondary | ICD-10-CM

## 2021-05-18 DIAGNOSIS — Z87891 Personal history of nicotine dependence: Secondary | ICD-10-CM

## 2021-05-18 DIAGNOSIS — H548 Legal blindness, as defined in USA: Secondary | ICD-10-CM | POA: Diagnosis present

## 2021-05-18 DIAGNOSIS — J984 Other disorders of lung: Secondary | ICD-10-CM

## 2021-05-18 DIAGNOSIS — E1142 Type 2 diabetes mellitus with diabetic polyneuropathy: Secondary | ICD-10-CM | POA: Diagnosis present

## 2021-05-18 DIAGNOSIS — E785 Hyperlipidemia, unspecified: Secondary | ICD-10-CM

## 2021-05-18 DIAGNOSIS — J440 Chronic obstructive pulmonary disease with acute lower respiratory infection: Secondary | ICD-10-CM | POA: Diagnosis present

## 2021-05-18 DIAGNOSIS — Z8616 Personal history of COVID-19: Secondary | ICD-10-CM

## 2021-05-18 DIAGNOSIS — I248 Other forms of acute ischemic heart disease: Secondary | ICD-10-CM | POA: Diagnosis present

## 2021-05-18 DIAGNOSIS — F32A Depression, unspecified: Secondary | ICD-10-CM

## 2021-05-18 DIAGNOSIS — R531 Weakness: Secondary | ICD-10-CM

## 2021-05-18 LAB — BASIC METABOLIC PANEL
Anion gap: 9 (ref 5–15)
BUN: 15 mg/dL (ref 8–23)
CO2: 32 mmol/L (ref 22–32)
Calcium: 8.6 mg/dL — ABNORMAL LOW (ref 8.9–10.3)
Chloride: 99 mmol/L (ref 98–111)
Creatinine, Ser: 0.94 mg/dL (ref 0.44–1.00)
GFR, Estimated: 57 mL/min — ABNORMAL LOW (ref >60)
Glucose, Bld: 121 mg/dL — ABNORMAL HIGH (ref 70–99)
Potassium: 4.3 mmol/L (ref 3.5–5.1)
Sodium: 140 mmol/L (ref 135–145)

## 2021-05-18 LAB — TROPONIN I (HIGH SENSITIVITY): Troponin I (High Sensitivity): 500 ng/L (ref <18)

## 2021-05-18 LAB — CBC
HCT: 29.1 % — ABNORMAL LOW (ref 36.0–46.0)
Hemoglobin: 8 g/dL — ABNORMAL LOW (ref 12.0–15.0)
MCH: 25.2 pg — ABNORMAL LOW (ref 26.0–34.0)
MCHC: 27.5 g/dL — ABNORMAL LOW (ref 30.0–36.0)
MCV: 91.5 fL (ref 80.0–100.0)
Platelets: 251 10*3/uL (ref 150–400)
RBC: 3.18 MIL/uL — ABNORMAL LOW (ref 3.87–5.11)
RDW: 23.4 % — ABNORMAL HIGH (ref 11.5–15.5)
WBC: 8.7 10*3/uL (ref 4.0–10.5)
nRBC: 0 % (ref 0.0–0.2)

## 2021-05-18 MED ORDER — SODIUM CHLORIDE 0.9 % IV BOLUS
1000.0000 mL | Freq: Once | INTRAVENOUS | Status: AC
  Administered 2021-05-19: 1000 mL via INTRAVENOUS

## 2021-05-18 NOTE — ED Provider Notes (Signed)
[facility] Provider Note    Event Date/Time   First MD Initiated Contact with Patient 05/18/21 2330     (approximate)   History   Weakness   HPI  Judy Harvey is a 88 y.o. female past medical history of diabetes, hypertension, hyperlipidemia, anemia, CVA who presents with generalized weakness.  Patient was recently admitted to the hospital after she presented with left-sided numbness.  She had an MRI that showed a right parietal infarct was found to have a right ICA occlusion and moderate severe right distal M1 stenosis.  Started on dual antiplatelet therapy.  During the hospitalization she was started on oxygen and was treated with bronchodilator.  There was no pneumonia on chest x-ray.  Supplemental oxygen was ordered for home.  Patient presents today because she was not eating and drinking at home.  She was advised for SNF but returned home is being cared for by her 3 year old son.  She notes that she has had no appetite and is just not wanting to get up.  Feeling generally weak.  She denies chest pain shortness of breath cough abdominal pain nausea vomiting fevers or chills.  Me that she    Past Medical History:  Diagnosis Date   Anemia    Depression    Diabetes mellitus without complication ([place])    GERD (gastroesophageal reflux disease)    History of COVID-19    Hyperlipidemia    Hypertension    Legally blind    Macular degeneration, bilateral    Peripheral neuropathy    Post herpetic neuralgia    located in her back   Stroke [facility])     Patient Active Problem List   Diagnosis Date Noted   Acute CVA (cerebrovascular accident) ([place]) 05/15/2021   Left sided numbness 05/14/2021   ARF (acute renal failure) ([place]) 03/12/2021   Hypotension due to hypovolemia    Acute respiratory failure with hypoxia ([place])    Community acquired pneumonia 10/25/2020   Major depressive 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 ([place]) 08/21/2018   Low vitamin B12 level  04/26/2018   Iron deficiency anemia 03/28/2018   Frequent PVCs 03/21/2018   PSVT (paroxysmal supraventricular tachycardia) ([place]) 03/21/2018   Normocytic anemia 02/28/2018   Benign essential HTN 02/20/2018   SOBOE (shortness of breath on exertion) 02/20/2018   Carotid stenosis, symptomatic w/o infarct, left 10/18/2017   TIA (transient ischemic attack) 06/16/2017   Bilateral carotid artery stenosis 06/16/2017   Hyperlipidemia 06/16/2017   Obesity (BMI 30.0-34.9) 05/02/2016   Vitamin D deficiency 03/25/2016   Type 2 diabetes mellitus without complication, with long-term current use of insulin ([place]) 03/24/2016   Old cerebrovascular accident (CVA) without late effect 03/24/2016   Legally blind 03/24/2016   Macular degeneration 03/24/2016   Constipation 03/24/2016   History of esophageal stricture 03/24/2016   Depression with anxiety 03/24/2016   Restless leg syndrome 03/24/2016   Postherpetic neuralgia 03/24/2016   Gait abnormality 03/24/2016     Physical Exam  Triage Vital Signs: ED Triage Vitals  Enc Vitals Group     BP 05/18/21 2117 (!) 169/62     Pulse Rate 05/18/21 2117 85     Resp 05/18/21 2117 20     Temp 05/18/21 2117 98.4 F (36.9 C)     Temp Source 05/18/21 2117 Oral     SpO2 05/18/21 2117 95 %     Weight 05/18/21 2108 160 lb 15 oz (73 kg)     Height 05/18/21 2108 5\' 2"  (1.575  m)     Head Circumference --      Peak Flow --      Pain Score 05/18/21 2108 5     Pain Loc --      Pain Edu? --      Excl. in [place]? --     Most recent vital signs: Vitals:   05/18/21 2117 05/19/21 0029  BP: (!) 169/62 131/82  Pulse: 85 88  Resp: 20 17  Temp: 98.4 F (36.9 C) 99.6 F (37.6 C)  SpO2: 95% 97%     General: Awake, no distress.  CV:  Good peripheral perfusion. No LE edema Resp:  Normal effort. No increased WOB, lungs are clear Abd:  No distention. Soft, non-tender Neuro:             Awake, Alert, Oriented x 3  Other:  Patient is hard of hearing, somewhat confused  but is able to carry on a conversation    ED Results / Procedures / Treatments  Labs (all labs ordered are listed, but only abnormal results are displayed) Labs Reviewed  BASIC METABOLIC PANEL - Abnormal; Notable for the following components:      Result Value   Glucose, Bld 121 (*)    Calcium 8.6 (*)    GFR, Estimated 57 (*)    All other components within normal limits  CBC - Abnormal; Notable for the following components:   RBC 3.18 (*)    Hemoglobin 8.0 (*)    HCT 29.1 (*)    MCH 25.2 (*)    MCHC 27.5 (*)    RDW 23.4 (*)    All other components within normal limits  BRAIN NATRIURETIC PEPTIDE - Abnormal; Notable for the following components:   B Natriuretic Peptide 400.4 (*)    All other components within normal limits  URINALYSIS, COMPLETE (UACMP) WITH MICROSCOPIC - Abnormal; Notable for the following components:   Color, Urine YELLOW (*)    APPearance CLEAR (*)    Ketones, ur 5 (*)    All other components within normal limits  HEPATIC FUNCTION PANEL - Abnormal; Notable for the following components:   Total Protein 6.1 (*)    Albumin 2.7 (*)    All other components within normal limits  APTT - Abnormal; Notable for the following components:   aPTT 37 (*)    All other components within normal limits  TROPONIN I (HIGH SENSITIVITY) - Abnormal; Notable for the following components:   Troponin I (High Sensitivity) 500 (*)    All other components within normal limits  RESP PANEL BY RT-PCR (FLU A&B, COVID) [id number]  PROTIME-INR  TROPONIN I (HIGH SENSITIVITY)     EKG  EKG interpreted by myself, normal sinus rhythm, right bundle branch block, left anterior fascicular block, similar to prior, no acute ischemic changes   RADIOLOGY I reviewed patient CT angio which is negative for pulmonary embolism, there are bilateral pleural effusions and mucous plugging   PROCEDURES:  Critical Care performed: Yes, see critical care procedure note(s)  .1-3 Lead EKG  Interpretation Performed by: Rada Hay, MD Authorized by: Rada Hay, MD     Interpretation: normal     ECG rate assessment: normal     Ectopy: none     Conduction: normal    The patient is on the cardiac monitor to evaluate for evidence of arrhythmia and/or significant heart rate changes.   MEDICATIONS ORDERED IN ED: Medications  furosemide (LASIX) injection 40 mg (has no administration in time range)  aspirin chewable tablet 324 mg (has no administration in time range)  sodium chloride 0.9 % bolus 1,000 mL (1,000 mLs Intravenous New Bag/Given 05/19/21 0025)  iohexol (OMNIPAQUE) 350 MG/ML injection 80 mL (80 mLs Intravenous Contrast Given 05/19/21 0050)     IMPRESSION / MDM / ASSESSMENT AND PLAN / ED COURSE  I reviewed the triage vital signs and the nursing notes.                              Differential diagnosis includes, but is not limited to, dehydration, AKI, metabolic abnormality, failure to thrive, ACS, infection, UTI  The patient is a 88 year old female who was recently diagnosed with a CVA and discharged from the hospital who presents with not eating at home generalized weakness.  Patient has no focal complaints.  Says that she was not eating and did not feel she could get up.  Initially telling me she felt like it was "time to go" but denies suicidal ideation.  She is mildly hypertensive.  She is requiring 2 L nasal cannula which she did while she was in the hospital as well which is new for her.  It was thought that she had some low level of hypoxemia at baseline and since she was not short of breath home oxygen was ordered.  Notably her troponin is 500.  Her CBC and BMP are reassuring.  Her EKG has a right bundle and left anterior fascicular block which is stable from prior and there is no acute ischemic change.  She has no active chest pain or dyspnea currently.  Certainly concerning given this new rise in troponin for potential ischemia.  Will evaluate for  infection obtain chest x-ray UA and urine sample see if this could potentially be demand related.  UA does not suggest infection.  Given patient's recent hypoxia with elevated troponin and BNP with concern for pulmonary embolism especially with the recent hospitalization.  CT angio obtained which is negative for PE.  There are bilateral pleural effusions and radiology comments on mucous plugging and groundglass opacities consistent with a pneumonitis but not with bacterial pneumonia.  She has no white count is not febrile will not treat with antibiotics at this point.  I reviewed her echo from this most recent hospitalization she does have grade 2 diastolic dysfunction with the pleural effusions and hypoxia they wonder if she is volume overloaded.  We will give her a dose of Lasix she is not on a diuretic at home.  Will admit to the hospital service.  Repeat troponin is pending.  If uptrending will consider heparin but given she is pain-free without ischemic EKG changes will hold off for now.      FINAL CLINICAL IMPRESSION(S) / ED DIAGNOSES   Final diagnoses:  Weakness  Elevated troponin     Rx / DC Orders   ED Discharge Orders     None        Note:  This document was prepared using Dragon voice recognition software and may include unintentional dictation errors.   Rada Hay, MD 05/19/21 [phone]

## 2021-05-18 NOTE — ED Triage Notes (Signed)
Pt to ED via EMS from home c/o generalized weakness, decreased appetite and not eating, and not getting out of bed today.  Pt was seen on Friday and evaluated for a Stroke and discharged Sunday, according to EMS was offered rehab but refused.  Pt reports she did go to rehab though for a couple days.  States just doesn't want to eat, denies SI/HI but states "just tired of everything medically".  States some neck pain but denies other pain or complaints.  Pt is alert and oriented, hard of hearing, legally blind, chest rise even and unlabored, in NAD at this time. ?

## 2021-05-19 ENCOUNTER — Emergency Department: Payer: Medicare Other

## 2021-05-19 ENCOUNTER — Inpatient Hospital Stay: Payer: Medicare Other

## 2021-05-19 ENCOUNTER — Inpatient Hospital Stay
Admit: 2021-05-19 | Discharge: 2021-05-19 | Disposition: A | Discharge status: Home / Self Care | Payer: Medicare Other | Attending: Internal Medicine | Admitting: Internal Medicine

## 2021-05-19 DIAGNOSIS — Z79899 Other long term (current) drug therapy: Secondary | ICD-10-CM | POA: Diagnosis not present

## 2021-05-19 DIAGNOSIS — Z87891 Personal history of nicotine dependence: Secondary | ICD-10-CM | POA: Diagnosis not present

## 2021-05-19 DIAGNOSIS — K219 Gastro-esophageal reflux disease without esophagitis: Secondary | ICD-10-CM | POA: Diagnosis present

## 2021-05-19 DIAGNOSIS — J440 Chronic obstructive pulmonary disease with acute lower respiratory infection: Secondary | ICD-10-CM | POA: Diagnosis present

## 2021-05-19 DIAGNOSIS — J9611 Chronic respiratory failure with hypoxia: Secondary | ICD-10-CM

## 2021-05-19 DIAGNOSIS — R200 Anesthesia of skin: Secondary | ICD-10-CM | POA: Diagnosis present

## 2021-05-19 DIAGNOSIS — J449 Chronic obstructive pulmonary disease, unspecified: Secondary | ICD-10-CM

## 2021-05-19 DIAGNOSIS — Z8673 Personal history of transient ischemic attack (TIA), and cerebral infarction without residual deficits: Secondary | ICD-10-CM

## 2021-05-19 DIAGNOSIS — H3530 Unspecified macular degeneration: Secondary | ICD-10-CM | POA: Diagnosis present

## 2021-05-19 DIAGNOSIS — D509 Iron deficiency anemia, unspecified: Secondary | ICD-10-CM | POA: Diagnosis present

## 2021-05-19 DIAGNOSIS — Z9981 Dependence on supplemental oxygen: Secondary | ICD-10-CM | POA: Diagnosis not present

## 2021-05-19 DIAGNOSIS — E1142 Type 2 diabetes mellitus with diabetic polyneuropathy: Secondary | ICD-10-CM | POA: Diagnosis present

## 2021-05-19 DIAGNOSIS — J690 Pneumonitis due to inhalation of food and vomit: Secondary | ICD-10-CM | POA: Diagnosis present

## 2021-05-19 DIAGNOSIS — Z7982 Long term (current) use of aspirin: Secondary | ICD-10-CM | POA: Diagnosis not present

## 2021-05-19 DIAGNOSIS — R778 Other specified abnormalities of plasma proteins: Secondary | ICD-10-CM | POA: Diagnosis not present

## 2021-05-19 DIAGNOSIS — F32A Depression, unspecified: Secondary | ICD-10-CM | POA: Diagnosis present

## 2021-05-19 DIAGNOSIS — Z794 Long term (current) use of insulin: Secondary | ICD-10-CM | POA: Diagnosis not present

## 2021-05-19 DIAGNOSIS — H548 Legal blindness, as defined in USA: Secondary | ICD-10-CM | POA: Diagnosis present

## 2021-05-19 DIAGNOSIS — R531 Weakness: Secondary | ICD-10-CM

## 2021-05-19 DIAGNOSIS — J189 Pneumonia, unspecified organism: Secondary | ICD-10-CM | POA: Diagnosis not present

## 2021-05-19 DIAGNOSIS — Z6832 Body mass index (BMI) 32.0-32.9, adult: Secondary | ICD-10-CM | POA: Diagnosis not present

## 2021-05-19 DIAGNOSIS — R1310 Dysphagia, unspecified: Secondary | ICD-10-CM | POA: Diagnosis present

## 2021-05-19 DIAGNOSIS — I248 Other forms of acute ischemic heart disease: Secondary | ICD-10-CM | POA: Diagnosis present

## 2021-05-19 DIAGNOSIS — I69398 Other sequelae of cerebral infarction: Secondary | ICD-10-CM | POA: Diagnosis not present

## 2021-05-19 DIAGNOSIS — Z79890 Hormone replacement therapy (postmenopausal): Secondary | ICD-10-CM | POA: Diagnosis not present

## 2021-05-19 DIAGNOSIS — Z8616 Personal history of COVID-19: Secondary | ICD-10-CM | POA: Diagnosis not present

## 2021-05-19 DIAGNOSIS — E669 Obesity, unspecified: Secondary | ICD-10-CM | POA: Diagnosis present

## 2021-05-19 DIAGNOSIS — Z7902 Long term (current) use of antithrombotics/antiplatelets: Secondary | ICD-10-CM | POA: Diagnosis not present

## 2021-05-19 LAB — GLUCOSE, CAPILLARY
Glucose-Capillary: 294 mg/dL — ABNORMAL HIGH (ref 70–99)
Glucose-Capillary: 93 mg/dL (ref 70–99)

## 2021-05-19 LAB — TROPONIN I (HIGH SENSITIVITY)
Troponin I (High Sensitivity): 689 ng/L (ref <18)
Troponin I (High Sensitivity): 607 ng/L (ref <18)
Troponin I (High Sensitivity): 695 ng/L (ref <18)
Troponin I (High Sensitivity): 696 ng/L (ref <18)

## 2021-05-19 LAB — ECHOCARDIOGRAM LIMITED
Calc EF: 69.6 %
Height: 62 in
Single Plane A2C EF: 59.7 %
Single Plane A4C EF: 76 %
Weight: 2576 oz

## 2021-05-19 LAB — HEPATIC FUNCTION PANEL
ALT: 8 U/L (ref 0–44)
AST: 15 U/L (ref 15–41)
Albumin: 2.7 g/dL — ABNORMAL LOW (ref 3.5–5.0)
Alkaline Phosphatase: 63 U/L (ref 38–126)
Bilirubin, Direct: <0.1 mg/dL (ref 0.0–0.2)
Total Bilirubin: 0.3 mg/dL (ref 0.3–1.2)
Total Protein: 6.1 g/dL — ABNORMAL LOW (ref 6.5–8.1)

## 2021-05-19 LAB — URINALYSIS, COMPLETE (UACMP) WITH MICROSCOPIC
Bacteria, UA: NONE SEEN
Bilirubin Urine: NEGATIVE
Glucose, UA: NEGATIVE mg/dL
Hgb urine dipstick: NEGATIVE
Ketones, ur: 5 mg/dL — AB
Leukocytes,Ua: NEGATIVE
Nitrite: NEGATIVE
Protein, ur: NEGATIVE mg/dL
Specific Gravity, Urine: 1.019 (ref 1.005–1.030)
WBC, UA: NONE SEEN WBC/hpf (ref 0–5)
pH: 5 (ref 5.0–8.0)

## 2021-05-19 LAB — CBG MONITORING, ED
Glucose-Capillary: 102 mg/dL — ABNORMAL HIGH (ref 70–99)
Glucose-Capillary: 108 mg/dL — ABNORMAL HIGH (ref 70–99)

## 2021-05-19 LAB — RESP PANEL BY RT-PCR (FLU A&B, COVID) ARPGX2
Influenza A by PCR: NEGATIVE
Influenza B by PCR: NEGATIVE
SARS Coronavirus 2 by RT PCR: NEGATIVE

## 2021-05-19 LAB — PROTIME-INR
INR: 1.1 (ref 0.8–1.2)
Prothrombin Time: 14 seconds (ref 11.4–15.2)

## 2021-05-19 LAB — APTT: aPTT: 37 seconds — ABNORMAL HIGH (ref 24–36)

## 2021-05-19 LAB — PROCALCITONIN: Procalcitonin: <0.1 ng/mL

## 2021-05-19 LAB — BRAIN NATRIURETIC PEPTIDE: B Natriuretic Peptide: 400.4 pg/mL — ABNORMAL HIGH (ref 0.0–100.0)

## 2021-05-19 MED ORDER — ACETAMINOPHEN 650 MG RE SUPP: 650.0000 mg | Freq: Four times a day (QID) | RECTAL | Status: DC | PRN

## 2021-05-19 MED ORDER — SODIUM CHLORIDE 0.9 % IV SOLN
3.0000 g | Freq: Four times a day (QID) | INTRAVENOUS | Status: DC
  Administered 2021-05-19 – 2021-05-20 (×5): 3 g via INTRAVENOUS
  Filled 2021-05-19 (×2): qty 8
  Filled 2021-05-19: qty 3
  Filled 2021-05-19 (×2): qty 8
  Filled 2021-05-19: qty 3
  Filled 2021-05-19 (×3): qty 8
  Filled 2021-05-19: qty 3

## 2021-05-19 MED ORDER — HEPARIN (PORCINE) 25000 UT/250ML-% IV SOLN: 850.0000 [IU]/h | INTRAVENOUS | Status: DC

## 2021-05-19 MED ORDER — ONDANSETRON HCL 4 MG/2ML IJ SOLN: 4.0000 mg | Freq: Four times a day (QID) | INTRAMUSCULAR | Status: DC | PRN

## 2021-05-19 MED ORDER — INSULIN ASPART 100 UNIT/ML IJ SOLN: 0.0000 [IU] | INTRAMUSCULAR | Status: DC

## 2021-05-19 MED ORDER — IPRATROPIUM-ALBUTEROL 0.5-2.5 (3) MG/3ML IN SOLN: 3.0000 mL | RESPIRATORY_TRACT | Status: DC | PRN

## 2021-05-19 MED ORDER — FUROSEMIDE 10 MG/ML IJ SOLN
40.0000 mg | Freq: Once | INTRAMUSCULAR | Status: AC
  Administered 2021-05-19: 40 mg via INTRAVENOUS
  Filled 2021-05-19: qty 4

## 2021-05-19 MED ORDER — ENOXAPARIN SODIUM 40 MG/0.4ML IJ SOSY: 40.0000 mg | PREFILLED_SYRINGE | INTRAMUSCULAR | Status: DC

## 2021-05-19 MED ORDER — ONDANSETRON HCL 4 MG PO TABS
4.0000 mg | ORAL_TABLET | Freq: Four times a day (QID) | ORAL | Status: DC | PRN
  Administered 2021-05-20 – 2021-05-21 (×2): 4 mg via ORAL
  Filled 2021-05-19 (×2): qty 1

## 2021-05-19 MED ORDER — METOPROLOL TARTRATE 25 MG PO TABS
12.5000 mg | ORAL_TABLET | Freq: Two times a day (BID) | ORAL | Status: DC
  Administered 2021-05-20 – 2021-05-22 (×5): 12.5 mg via ORAL
  Filled 2021-05-19 (×6): qty 1

## 2021-05-19 MED ORDER — ASPIRIN EC 81 MG PO TBEC
81.0000 mg | DELAYED_RELEASE_TABLET | Freq: Every day | ORAL | Status: DC
  Administered 2021-05-20 – 2021-05-22 (×3): 81 mg via ORAL
  Filled 2021-05-19 (×3): qty 1

## 2021-05-19 MED ORDER — PANTOPRAZOLE SODIUM 40 MG IV SOLR
40.0000 mg | INTRAVENOUS | Status: DC
  Administered 2021-05-19 – 2021-05-21 (×3): 40 mg via INTRAVENOUS
  Filled 2021-05-19 (×3): qty 10

## 2021-05-19 MED ORDER — ATORVASTATIN CALCIUM 20 MG PO TABS
80.0000 mg | ORAL_TABLET | Freq: Every day | ORAL | Status: DC
Start: 2021-05-19 — End: 2021-05-22
  Administered 2021-05-19 – 2021-05-22 (×4): 80 mg via ORAL
  Filled 2021-05-19: qty 4
  Filled 2021-05-19 (×2): qty 1
  Filled 2021-05-19: qty 4

## 2021-05-19 MED ORDER — PIPERACILLIN-TAZOBACTAM 3.375 G IVPB
3.3750 g | Freq: Three times a day (TID) | INTRAVENOUS | Status: DC
  Administered 2021-05-19: 3.375 g via INTRAVENOUS
  Filled 2021-05-19: qty 50

## 2021-05-19 MED ORDER — HEPARIN BOLUS VIA INFUSION
4000.0000 [IU] | Freq: Once | INTRAVENOUS | Status: DC
  Filled 2021-05-19: qty 4000

## 2021-05-19 MED ORDER — ENOXAPARIN SODIUM 40 MG/0.4ML IJ SOSY
40.0000 mg | PREFILLED_SYRINGE | Freq: Every day | INTRAMUSCULAR | Status: DC
  Administered 2021-05-19 – 2021-05-21 (×3): 40 mg via SUBCUTANEOUS
  Filled 2021-05-19 (×3): qty 0.4

## 2021-05-19 MED ORDER — IOHEXOL 350 MG/ML SOLN
80.0000 mL | Freq: Once | INTRAVENOUS | Status: AC | PRN
  Administered 2021-05-19: 80 mL via INTRAVENOUS

## 2021-05-19 MED ORDER — ASPIRIN 81 MG PO CHEW
324.0000 mg | CHEWABLE_TABLET | Freq: Once | ORAL | Status: AC
  Administered 2021-05-19: 324 mg via ORAL
  Filled 2021-05-19: qty 4

## 2021-05-19 MED ORDER — CLOPIDOGREL BISULFATE 75 MG PO TABS
75.0000 mg | ORAL_TABLET | Freq: Every day | ORAL | Status: DC
  Administered 2021-05-19 – 2021-05-22 (×4): 75 mg via ORAL
  Filled 2021-05-19 (×4): qty 1

## 2021-05-19 MED ORDER — ACETAMINOPHEN 325 MG PO TABS
650.0000 mg | ORAL_TABLET | Freq: Four times a day (QID) | ORAL | Status: DC | PRN
  Administered 2021-05-20 – 2021-05-21 (×3): 650 mg via ORAL
  Filled 2021-05-19 (×2): qty 2

## 2021-05-19 MED ORDER — LACTATED RINGERS IV SOLN: INTRAVENOUS | Status: DC

## 2021-05-19 NOTE — Progress Notes (Signed)
Pharmacy Antibiotic Note ? ?Judy Harvey is a 88 y.o. female admitted on 05/18/2021 with aspiration pneumonia.  Pharmacy has been consulted for Zosyn dosing. ? ?Plan: ?Zosyn 3.375g IV q8h (4 hr infusion) x 5 days per indication and renal fxn.  Pharmacy will continue to follow and will adjust dosing if warranted. ? ?Height: 5\' 2"  (157.5 cm) ?Weight: 73 kg (161 lb) ?IBW/kg (Calculated) : 50.1 ? ?Temp (24hrs), Avg:99 ?F (37.2 ?C), Min:98.4 ?F (36.9 ?C), Max:99.6 ?F (37.6 ?C) ? ?Recent Labs  ?Lab 05/14/21 ?1450 05/16/21 ?0629 05/18/21 ?2115  ?WBC 10.0 7.9 8.7  ?CREATININE 1.02* 1.00 0.94  ?  ?Estimated Creatinine Clearance: 35.7 mL/min (by C-G formula based on SCr of 0.94 mg/dL).   ? ?Allergies  ?Allergen Reactions  ? Adhesive [Tape] Other (See Comments)  ?  "pulls my skin off" paper tape ok  ? Lyrica [Pregabalin]   ?  Body spasms  ? ? ?Antimicrobials this admission: ?3/8 Zosyn >> x 5 days ? ?Microbiology results: ?No lab cx ordered or pending at this time. ? ?Thank you for allowing pharmacy to be a part of this patient?s care. ? ?5/8, PharmD, MBA ?05/19/2021 ?3:06 AM ? ? ?

## 2021-05-19 NOTE — Assessment & Plan Note (Addendum)
Uncertain etiology but some concern for aspiration pneumonitis given history of esophageal stricture and patient's complaints of dysphagia, in the setting of recent stroke ?Procalcitonin negative. ?Given concern for aspiration, IV Zosyn was switched over to Unasyn and completed roughly 3 days course overall.  Switched over to Augmentin at discharge to complete total 5 days treatment. ?Speech therapy evaluated and recommend regular consistency diet and thin liquids. ?

## 2021-05-19 NOTE — Assessment & Plan Note (Addendum)
Troponin 500 with no complaints of chest pain and nonacute EKG ?Possible elevation related to recent CVA a few days prior and current pneumonia. ?Cardiology consulted and indicated demand ischemia versus NSTEMI.  Troponins up to 695. ?CTA negative for PE. ?They discussed management options and final decision was made to pursue conservative approach with medical management.  Aggressive intervention was not felt appropriate given her advanced age, recent CVA and anemia.  ?Continuing aspirin, Plavix, statins and low-dose beta-blockers were added. ?Cardiology has signed off. ?TTE shows LVEF 55-60% with no LV wall motion abnormalities. ?

## 2021-05-19 NOTE — Progress Notes (Signed)
*  PRELIMINARY RESULTS* ?Echocardiogram ?2D Echocardiogram has been performed. ? ?Joanette Gula Lydiann Bonifas ?05/19/2021, 2:02 PM ?

## 2021-05-19 NOTE — ED Notes (Signed)
Pt resting calm and comfortably at this time. Normal rise and fall of chest. Call bell in reach.  ?

## 2021-05-19 NOTE — Progress Notes (Signed)
Clinical/Bedside Swallow Evaluation ?Patient Details  ?Name: Judy Harvey ?MRN: [id number] ?Date of Birth: [birth year] ? ?Today's Date: 05/19/2021 ?Time: SLP Start Time (ACUTE ONLY): 1110 SLP Stop Time (ACUTE ONLY): 1150 ?SLP Time Calculation (min) (ACUTE ONLY): 40 min ? ?Past Medical History:  ?Past Medical History:  ?Diagnosis Date  ? Anemia   ? Depression   ? Diabetes mellitus without complication ([place])   ? GERD (gastroesophageal reflux disease)   ? History of COVID-19   ? Hyperlipidemia   ? Hypertension   ? Legally blind   ? Macular degeneration, bilateral   ? Peripheral neuropathy   ? Post herpetic neuralgia   ? located in her back  ? Stroke [facility])   ? ?Past Surgical History:  ?Past Surgical History:  ?Procedure Laterality Date  ? ABDOMINAL HYSTERECTOMY    ? partial  ? APPENDECTOMY    ? BREAST SURGERY    ? reduction  ? CAROTID PTA/STENT INTERVENTION Left 10/18/2017  ? Procedure: CAROTID PTA/STENT INTERVENTION;  Surgeon: Algernon Huxley, MD;  Location: [facility];  Service: Cardiovascular;  Laterality: Left;  ? CATARACT EXTRACTION, BILATERAL    ? CESAREAN SECTION    ? x3  ? COLONOSCOPY    ? ESOPHAGEAL DILATION    ? ?HPI:  ?Patient is a 88 y.o. female who is legally blind with past medical history including CVA, obesity, esophageal stricture, GERD, HTN, HLD, anemia who presented with weakness, concern for NSTEMI. Recently hospitalized with CVA, discharged on 05/16/21 (MRI showed right parietal infarct, right ICA occlusion and moderate severe right distal M1 stenosis). CT Chest on 05/19/21 noted for "Bronchitis in the upper and lower lobes with bilateral lower lobe, mucous plugging and peripheral ground-glass infiltrates in the right upper and middle lobes most likely reflecting multilobar pneumonitis," and "moderate-sized hiatal hernia." During recent admission, pt passed [place] and was evaluated by SLP for cognition without further needs. Seen by SLP during another prior admission 11/25/19 and placed  on dysphagia 2/thin liquids due to history of esophageal dysphagia and extended time needed for mastication of solids (left partials at home). Pt reports EGD with dilation x2 in the past (>10 years ago); these records are not available in [place].  ?  ?Assessment / Plan / Recommendation  ?Clinical Impression ?  Patient presents with no s/sx of oropharyngeal dysphagia, however she does exhibit belching and report globus sensation mid-sternum, which is consistent with her history of esophageal dysphagia (stricture, GERD, hiatal hernia). When oriented to items on tray, patient was able to self-feed regular solids, puree and thin liquids (by cup and straw) without overt signs of aspiration. Vocal quality remained clear, vital signs stable across trials. Patient does have her bottom partial in place and exhibits effective mastication of regular solids (graham cracker with peanut butter) as well as appearance of timely oral transfer and swallow initiation, and complete oral clearance. Oral motor examination is WFL. Patient reports sensation of foods sticking in her esophagus and needing to belch frequently. Reports difficulty with solids, primarily, although acknowledges belching occurs with liquids. She demonstrates ability to self-select appropriate solids (however needs assist to read menu choices due to visual impairments), therefore I recommend regular diet with thin liquids, medications whole with liquid or whole in puree per pt preference. Patient at mildly increased risk for aspiration given history of reflux and esophageal dysphagia; recommend reflux precautions which were discussed with RN: position upright 90 degrees for meals, remain upright 30 min after meals, slow rate, small  bites and sips, and may benefit from alternating solids/liquids. Recommend follow-up with GI as outpatient. No further acute needs identified, SLP to s/o.  ? ? ? ? ?SLP Visit Diagnosis: Dysphagia, unspecified (R13.10) ?    ?Aspiration Risk ? Mild aspiration risk  ?  ?Diet Recommendation Regular;Thin liquid  ? ?Liquid Administration via: Cup;Straw ?Medication Administration: Whole meds with liquid ?Supervision: Comment (Assist with tray set up, positioning prior to meals) ?Compensations: Minimize environmental distractions;Slow rate;Small sips/bites;Follow solids with liquid ?Postural Changes: Seated upright at 90 degrees;Remain upright for at least 30 minutes after po intake  ?  ?Other  Recommendations Recommended Consults: Consider GI evaluation ?Oral Care Recommendations: Oral care BID   ? ?Recommendations for follow up therapy are one component of a multi-disciplinary discharge planning process, led by the attending physician.  Recommendations may be updated based on patient status, additional functional criteria and insurance authorization. ? ?Follow up Recommendations No SLP follow up  ? ? ?  ?Assistance Recommended at Discharge None  ?Functional Status Assessment Patient has had a recent decline in their functional status and demonstrates the ability to make significant improvements in function in a reasonable and predictable amount of time.  ?Frequency and Duration Other (Comment) (one time visit)  ?  ?  ?   ? ?Prognosis    ? ?  ? ?Swallow Study   ?General Date of Onset: 05/18/21 ?HPI: Patient is a 88 y.o. female who is legally blind with past medical history including CVA, obesity, esophageal stricture, GERD, HTN, HLD, anemia who presented with weakness, concern for NSTEMI. Recently hospitalized with CVA, discharged on 05/16/21 (MRI showed right parietal infarct, right ICA occlusion and moderate severe right distal M1 stenosis). 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 Seen by SLP during another prior admission 11/25/19 and placed on dysphagia 2/thin liquids due to history of esophageal dysphagia and extended time needed for mastication of solids (left partials at home). Pt reports EGD with dilation x2 in the past (>10 years ago); these records are not available in [place]. ?Type of Study: Bedside Swallow Evaluation ?Previous Swallow Assessment: see HPI ?Diet Prior to this Study: NPO ?Temperature Spikes Noted: No ?Respiratory Status: Nasal cannula (2L) ?History of Recent Intubation: No ?Behavior/Cognition: Alert;Cooperative;Pleasant mood ?Oral Cavity Assessment: Within Functional Limits ?Oral Care Completed by SLP: No ?Oral Cavity - Dentition: Other (Comment) (bottom partial) ?Vision:  (impaired, needs set up/orientation to items on tray) ?Self-Feeding Abilities: Able to feed self;Needs set up ?Patient Positioning: Upright in bed ?Baseline Vocal Quality: Normal ?Volitional Cough: Strong ?Volitional Swallow: Able to elicit  ?  ?Oral/Motor/Sensory Function Overall Oral Motor/Sensory Function: Within functional limits   ?Ice Chips Ice chips: Not tested   ?Thin Liquid Thin Liquid: Within functional limits ?Presentation: Cup;Straw;Self Fed  ?  ?Nectar Thick Nectar Thick Liquid: Not tested   ?Honey Thick Honey Thick Liquid: Not tested   ?Puree Puree: Within functional limits ?Presentation: Spoon   ?Solid ? ? ?  Solid: Within functional limits ?Presentation: Self Fed  ? ?  ?Deneise Lever, MS, CCC-SLP ?Speech-Language Pathologist ?[phone] ? ?Aliene Altes ?05/19/2021,12:30 PM ? ? ? ? ?

## 2021-05-19 NOTE — Consult Note (Addendum)
Cardiology Consult    Patient ID: ENDIYAH MCKAGUE MRN: [id number], DOB/AGE: [birth year]   Admit date: 05/18/2021 Date of Consult: 05/19/2021  Primary Physician: Leonel Ramsay, MD Primary Cardiologist: Kate Sable, MD - new Requesting Provider: A. Algis Liming, MD  Patient Profile    Judy Harvey is a 88 y.o. female with a history of recent CVA, carotid arterial/cerebrovascular dzs, HTN, HL, DMII, legal blindness, anemia, depression, and GERD, who is being seen today for the evaluation of NSTEMI at the request of Dr. Algis Liming.  Past Medical History   Past Medical History:  Diagnosis Date   Anemia    Depression    Diabetes mellitus without complication (HCC)    GERD (gastroesophageal reflux disease)    History of COVID-19    Hyperlipidemia    Hypertension    Legally blind    Macular degeneration, bilateral    Peripheral neuropathy    Post herpetic neuralgia    located in her back   Stroke [facility])     Past Surgical History:  Procedure Laterality Date   ABDOMINAL HYSTERECTOMY     partial   APPENDECTOMY     BREAST SURGERY     reduction   CAROTID PTA/STENT INTERVENTION Left 10/18/2017   Procedure: CAROTID PTA/STENT INTERVENTION;  Surgeon: Algernon Huxley, MD;  Location: [facility];  Service: Cardiovascular;  Laterality: Left;   CATARACT EXTRACTION, BILATERAL     CESAREAN SECTION     x3   COLONOSCOPY     ESOPHAGEAL DILATION       Allergies  Allergies  Allergen Reactions   Adhesive [Tape] Other (See Comments)    "pulls my skin off" paper tape ok   Lyrica [Pregabalin]     Body spasms    History of Present Illness    88 y.o. female with a history of recent CVA, carotid arterial/cerebrovascular dzs, HTN, HL, DMII, legal blindness, anemia, depression, and GERD.  She saw a cardiologist many years ago, though she doesn't remember why, and isn't aware of any significant cardiology history.  She reports having a poor appetite dating back to prior to  Christmas 2022.  She was admitted 03/12/2021 w/ weakness and falls.  She was found to be hypotensive and anemic (hgb 7.9).  AKI also noted w/ creat 3.7.  She was treated w/ IVF and 1u prbcs and then d/c'd 03/19/2021.  She says that she briefly went to rehab, but was then d/c'd to home.  At home, she was walking some w/ a walker but acitivy overall, was limited.  She was readmitted 3/3 after reporting L sided numbness @ hematology appt.  CT neg however, MR showed acute sub-cm R parietal infarct and MRA showed R ICA occlusion and hgih grade R ACA A1 segment and ant communicating artery stenoses (CTA head and neck did not show R A1 segment/ant communicating artery dzs, but did show mod/severe dzs in the distal R M1 segment of MCA).  Echo showed nl LV fxn @ 60-65% w/ GrII DD, nl RV fxn, and mild MR.  Tele was unremarkable.    She was d/c'd home w/ home health on 3/6 (initial rec was for SNF), but presented back to the ED late on 3/7 w/ a 2 day history of profound weakness, noting that she couldn't get up from her bed and even had trouble lifting her neck off of her pillow.  Here, H/H were stable @ 8.0/29.1, BNP was mildly elevated @ 400.4, and HsTrop was elevated @ 500.  HsTrop subsequently rose to 689  607  695.  ECG - RSR, 83, 1st deg AVB, LAD, LAFB, RBBB, LVH - no acute changes.  CTA chest was neg for PE but showed bronchitis w/ bilat lower lbe mucous plugging and peripheral ground-glass infiltrates in the RUL/RML, most likely reflecting multilobar pneumonitis.  She was initially given IVF, but subsequently IV lasix and zosyn.  She denies any recent or prior history of chest pain.  She has some degree of chronic DOE, though this hasn't changed recently.  She denies fever, chills, pnd, orthopnea, n, v, dizziness, syncope, edema, or early satiety.  Inpatient Medications     pantoprazole (PROTONIX) IV  40 mg Intravenous Q24H    Family History    Family History  Problem Relation Age of Onset   Healthy Mother     Diabetes Father    Alzheimer's disease Father    Multiple sclerosis Sister    Multiple sclerosis Brother    She indicated that her mother is deceased. She indicated that her father is deceased. She indicated that her sister is deceased. She indicated that her brother is deceased.   Social History    Social History   Socioeconomic History   Marital status: Widowed    Spouse name: Not on file   Number of children: 3   Years of education: some college   Highest education level: Not on file  Occupational History   Occupation: Disabled  Tobacco Use   Smoking status: Former    Packs/day: 3.00    Years: 50.00    Pack years: 150.00    Types: Cigarettes    Quit date: 2008    Years since quitting: 15.1   Smokeless tobacco: Never   Tobacco comments:    smoking cessation materials not required  Vaping Use   Vaping Use: Never used  Substance and Sexual Activity   Alcohol use: No   Drug use: No   Sexual activity: Not Currently  Other Topics Concern   Not on file  Social History Narrative   Pt lives with son [name]   Social Determinants of Health   Financial Resource Strain: Not on file  Food Insecurity: Not on file  Transportation Needs: Not on file  Physical Activity: Not on file  Stress: Not on file  Social Connections: Not on file  Intimate Partner Violence: Not on file     Review of Systems    General:  Generalized weakness since 3/5.  No chills, fever, night sweats or weight changes.  Cardiovascular:  No chest pain, +++ chronic dyspnea on exertion, no edema, orthopnea, palpitations, paroxysmal nocturnal dyspnea. Dermatological: No rash, lesions/masses Respiratory: No cough, +++ dyspnea Urologic: No hematuria, dysuria Abdominal:   No nausea, vomiting, diarrhea, bright red blood per rectum, melena, or hematemesis Neurologic:  legally blind generalized wkns w/ L sided numbness since CVA, no changes in mental status. All other systems reviewed and are otherwise  negative except as noted above.  Physical Exam    Blood pressure (!) 116/53, pulse 90, temperature 99.6 F (37.6 C), temperature source Rectal, resp. rate (!) 26, height 5\' 2"  (1.575 m), weight 73 kg, SpO2 97 %.  General: Pleasant, NAD Psych: Normal affect. Neuro: Alert and oriented X 3. Moves all extremities spontaneously. HEENT: HOH. Blind. Neck: Supple without bruits or JVD. Lungs:  Resp regular and unlabored, diminished breath sounds bilat bases. Heart: RRR no s3, s4, 2/6 SEM @ upper sternal borders. Abdomen: Soft, non-tender, non-distended, BS + x 4.  Extremities: No clubbing, cyanosis or edema. DP/PT1+, Radials 2+ and equal bilaterally.  Labs    Cardiac Enzymes Recent Labs  Lab 05/18/21 2115 05/19/21 0159 05/19/21 0330 05/19/21 0811  TROPONINIHS 500* 689* 607* 695*      Lab Results  Component Value Date   WBC 8.7 05/18/2021   HGB 8.0 (L) 05/18/2021   HCT 29.1 (L) 05/18/2021   MCV 91.5 05/18/2021   PLT 251 05/18/2021    Recent Labs  Lab 05/18/21 2115  NA 140  K 4.3  CL 99  CO2 32  BUN 15  CREATININE 0.94  CALCIUM 8.6*  PROT 6.1*  BILITOT 0.3  ALKPHOS 63  ALT 8  AST 15  GLUCOSE 121*   Lab Results  Component Value Date   CHOL 143 05/15/2021   HDL 36 (L) 05/15/2021   LDLCALC 92 05/15/2021   TRIG 73 05/15/2021     Radiology Studies    CT ANGIO HEAD NECK W WO CM  Result Date: 05/15/2021 CLINICAL DATA:  Provided history: Neuro deficit, acute, stroke suspected. Further eval of ACA stenosis seen on MRA. Numbness. EXAM: CT ANGIOGRAPHY HEAD AND NECK TECHNIQUE: Multidetector CT imaging of the head and neck was performed using the standard protocol during bolus administration of intravenous contrast. Multiplanar CT image reconstructions and MIPs were obtained to evaluate the vascular anatomy. Carotid stenosis measurements (when applicable) are obtained utilizing NASCET criteria, using the distal internal carotid diameter as the denominator. RADIATION DOSE  REDUCTION: This exam was performed according to the departmental dose-optimization program which includes automated exposure control, adjustment of the mA and/or kV according to patient size and/or use of iterative reconstruction technique. CONTRAST:  10mL OMNIPAQUE IOHEXOL 350 MG/ML SOLN COMPARISON:  MRA head and MRI brain 05/14/2021. FINDINGS: CT HEAD FINDINGS Brain: Mild generalized cerebral and cerebellar atrophy. A small acute/early subacute infarct within the right parietal lobe was better appreciated on the brain MRI of 05/14/2021. Redemonstrated small chronic cortical/subcortical infarct within the right parietal lobe (for instance as seen on series 4, image 24). Background moderate/advanced patchy and ill-defined hypoattenuation within the cerebral white matter, nonspecific but compatible with chronic small vessel ischemic disease. Known chronic lacunar infarct within the left thalamus, better appreciated on the prior MRI. There is no acute intracranial hemorrhage. No extra-axial fluid collection. No evidence of an intracranial mass. No midline shift. Vascular: No hyperdense vessel.  Atherosclerotic calcifications. Skull: Normal. Negative for fracture or focal lesion. Sinuses: Partial opacification of a posterior right ethmoid air cell. Orbits: No orbital mass or acute orbital finding. Review of the MIP images confirms the above findings CTA NECK FINDINGS Aortic arch: Standard aortic branching. Atherosclerotic plaque within the visualized aortic arch and proximal major branch vessels of the neck. No hemodynamically significant innominate or proximal subclavian artery stenosis. Right carotid system: The common carotid artery is patent within the neck. Atherosclerotic plaque about the carotid bifurcation. The right ICA is occluded beginning at its origin and remains occluded throughout the remainder of the neck. Left carotid system: CCA and ICA patent within the neck. A stent traverses the distal common  carotid artery and proximal internal carotid artery. Stent artifact limits evaluation for intra stent stenosis. However, the stent appears patent. No hemodynamically significant stenosis elsewhere within the left common carotid or internal carotid artery. Vertebral arteries: Vertebral arteries codominant and patent within the neck without hemodynamically significant stenosis. Mild atherosclerotic plaque within the left V1, left V2 and bilateral V3 vertebral arteries. Skeleton: Cervical spondylosis. No acute bony abnormality or  aggressive osseous lesion. Cervical spondylosis. Other neck: No neck mass or cervical lymphadenopathy. Upper chest: No consolidation within the imaged lung apices. Atelectasis within the dependent aspect of the right upper lobe. Review of the MIP images confirms the above findings CTA HEAD FINDINGS Anterior circulation: The right internal carotid artery remains occluded intracranially. The M1 middle cerebral arteries are patent. There is an apparent moderate/severe stenosis within the distal M1 right MCA (series 13, image 50). Atherosclerotic irregularity of the M2 and more distal middle cerebral arteries. No M2 proximal branch occlusion is identified. The anterior cerebral arteries are patent. There is no appreciable stenosis within the A1 right ACA or anterior communicating artery, as was questioned on the recent prior MRA head. No intracranial aneurysm is identified. Posterior circulation: The intracranial vertebral arteries are patent. The basilar artery is patent. The posterior cerebral arteries are patent. Posterior communicating arteries are diminutive or absent bilaterally. Venous sinuses: Within the limitations of contrast timing, no convincing thrombus. Anatomic variants: As described. Review of the MIP images confirms the above findings IMPRESSION: CT head: 1. A small acute/early subacute infarct within the right parietal lobe was better appreciated on the prior brain MRI of  05/14/2021. 2. Redemonstrated small chronic cortical/subcortical infarct within the right parietal lobe. 3. Background moderate/advanced chronic small vessel ischemic changes within the cerebral white matter. 4. Redemonstrated chronic lacunar infarct within the left thalamus. 5. Mild generalized parenchymal atrophy. 6. Right ethmoid sinusitis. CTA neck: 1. The right internal carotid artery is occluded beginning at its origin and remains occluded throughout the remainder of the neck. 2. A stent traverses the distal left common carotid artery and proximal left internal carotid artery. Stent artifact limits evaluation for intra-stent stenosis. However, the stent appears patent. No hemodynamically significant stenosis elsewhere within the left common or internal carotid arteries within the neck. 3. Vertebral arteries codominant and patent within the neck without hemodynamically significant stenosis. Mild atherosclerotic plaque within the left V1, left V2 and bilateral V3 vertebral arteries. CTA head: 1. The right internal carotid artery remains occluded intracranially. 2. Additional intracranial atherosclerotic disease, as described. Most notably, there is an apparent moderate/severe stenosis within the distal M1 segment of the right middle cerebral artery. 3. No appreciable stenosis within the right A1 segment or anterior communicating artery, as was questioned on the recent prior MRA head. Electronically Signed   By: Kellie Simmering D.O.   On: 05/15/2021 14:57   DG Chest 2 View  Result Date: 05/15/2021 CLINICAL DATA:  Respiratory failure. EXAM: CHEST - 2 VIEW COMPARISON:  October 25, 2020 FINDINGS: Evaluation is limited by rotation. The cardiomediastinal silhouette is unchanged and enlarged in contour. Small bilateral pleural effusions. No large pneumothorax. Mild peribronchial cuffing mild reticular prominence. Visualized abdomen is unremarkable. Remote RIGHT-sided rib fractures. Hiatal hernia. IMPRESSION:  Constellation of findings are favored to reflect mild pulmonary edema with small bilateral pleural effusions. Electronically Signed   By: Valentino Saxon M.D.   On: 05/15/2021 17:15   CT HEAD WO CONTRAST  Result Date: 05/14/2021 CLINICAL DATA:  Left-sided numbness and tingling since this morning, left leg weakness EXAM: CT HEAD WITHOUT CONTRAST TECHNIQUE: Contiguous axial images were obtained from the base of the skull through the vertex without intravenous contrast. RADIATION DOSE REDUCTION: This exam was performed according to the departmental dose-optimization program which includes automated exposure control, adjustment of the mA and/or kV according to patient size and/or use of iterative reconstruction technique. COMPARISON:  11/24/2019 FINDINGS: Brain: Stable hypodensities throughout the periventricular white matter  consistent with chronic small vessel ischemic change. No evidence of acute infarct or hemorrhage. Lateral ventricles and midline structures are unremarkable. No acute extra-axial fluid collections. No mass effect. Vascular: No hyperdense vessel or unexpected calcification. Stable atherosclerosis. Skull: Normal. Negative for fracture or focal lesion. Sinuses/Orbits: No acute finding. Other: None. IMPRESSION: 1. Stable head CT, no acute intracranial process. Electronically Signed   By: Randa Ngo M.D.   On: 05/14/2021 15:27   CT Angio Chest PE W and/or Wo Contrast  Result Date: 05/19/2021 CLINICAL DATA:  Pulmonary embolism suspected. Generalized weakness with decreased appetite. EXAM: CT ANGIOGRAPHY CHEST WITH CONTRAST TECHNIQUE: Multidetector CT imaging of the chest was performed using the standard protocol during bolus administration of intravenous contrast. Multiplanar CT image reconstructions and MIPs were obtained to evaluate the vascular anatomy. RADIATION DOSE REDUCTION: This exam was performed according to the departmental dose-optimization program which includes automated  exposure control, adjustment of the mA and/or kV according to patient size and/or use of iterative reconstruction technique. CONTRAST:  20mL OMNIPAQUE IOHEXOL 350 MG/ML SOLN COMPARISON:  Chest PA and lateral from earlier today, PA and lateral chest 05/15/2021, chest CTA 11/06/2019 FINDINGS: Cardiovascular: There is mild panchamber cardiomegaly with increased septal and left ventricular wall hypertrophy. The pulmonary veins are decompressed. There is moderate to heavy three-vessel calcific CAD. There is minimal superior recess pericardial effusion. Pulmonary arteries are normal in caliber without appreciable embolic filling defects. There is chronic lipomatous hypertrophy of the inter-atrial septum. No findings of acute right heart strain. Moderate aortic atherosclerosis is seen with aortic tortuosity and normal great vessel branching and opacification. There is no aneurysm or dissection. Mediastinum/Nodes: Moderate-sized hiatal hernia. Thyroid gland mildly prominent with dystrophic calcifications in the left lobe and several small nodules, but not well seen due to artifact from the patient's superimposed right arm and contrast in the left subclavian vein. There are slightly prominent right paratracheal lymph nodes, stable. There is no further intrathoracic adenopathy, no hilar or axillary adenopathy, no tracheal filling defects or focal esophageal mass. Lungs/Pleura: Central bronchial thickening again is noted in both upper and lower lobes. There is mucous plugging in several bilateral posterior basal lower lobe small bronchi. Peripheral hazy opacities noted in the right upper lobe base and right middle lobe laterally most likely representing mild pneumonitis. Other widespread tree-in-bud interstitial changes noted on the prior study are not seen today. No subpleural interstitial septal edema is seen. Reticular scarring at the apices is again shown. There are small bilateral layering pleural effusions symmetrically,  with adjacent compressive atelectasis in the lower lobes. Upper Abdomen: No acute abnormality. 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 There are subtle capsular nodular changes in the left lobe of the liver consistent with early cirrhosis but there is no splenomegaly, ascites or portal vein dilatation. Musculoskeletal: Osteopenia and degenerative change thoracic spine. There are healed fracture deformities of some of the anterolateral right ribs. There is a T12 upper plate anterior wedge compression fracture with 30% anterior and 15% posterior vertebral height loss with 4 mm posterosuperior cortical retropulsion and no paraspinal hematoma. This is new from 11/06/2019 but age indeterminate. Other vertebrae are normal in heights. The pedicles and posterior elements are intact. Review of the MIP images confirms the above findings. IMPRESSION: 1. No evidence of arterial dilatation or emboli. 2. Aortic atherosclerosis without aneurysm or dissection. Coronary artery atherosclerosis. 3. Cardiomegaly without venous distention or subpleural edema. Small pleural effusions. 4. Bronchitis in the upper  and lower lobes with bilateral lower lobe mucous plugging and peripheral ground-glass infiltrates in the right upper and middle lobes most likely reflecting multilobar pneumonitis. Follow-up as indicated. 5. Suspected early cirrhosis left lobe of the liver. No splenomegaly or portal dilatation. 6. Moderate-sized hiatal hernia. 7. Osteopenia with superior endplate anterior wedge compression fracture of T12 with retropulsion, new from 11/06/2019 but age indeterminate. No adjacent hematoma. 8. Additional chronic findings. Electronically Signed   By: Telford Nab M.D.   On: 05/19/2021 01:27   MR ANGIO HEAD WO CONTRAST  Result Date: 05/14/2021 CLINICAL DATA:  Left-sided numbness and tingling EXAM: MRI HEAD WITHOUT CONTRAST MRA HEAD WITHOUT CONTRAST TECHNIQUE: Multiplanar,  multi-echo pulse sequences of the brain and surrounding structures were acquired without intravenous contrast. Angiographic images of the Circle of Willis were acquired using MRA technique without intravenous contrast. COMPARISON:  No pertinent prior exam. FINDINGS: MRI HEAD FINDINGS Brain: 8 diffusion abnormality in the right parietal white matter, possibly subacute ischemia. No other diffusion abnormality. Focus of chronic microhemorrhage in the right cerebellum. Hyperintense T2-weighted signal is moderately widespread throughout the white matter. Generalized volume loss without a clear lobar predilection. The midline structures are normal. Vascular: Abnormal right ICA flow void Skull and upper cervical spine: Normal calvarium and skull base. Visualized upper cervical spine and soft tissues are normal. Sinuses/Orbits:No paranasal sinus fluid levels or advanced mucosal thickening. No mastoid or middle ear effusion. Normal orbits. MRA HEAD FINDINGS POSTERIOR CIRCULATION: --Vertebral arteries: Normal --Inferior cerebellar arteries: Normal. --Basilar artery: Normal. --Superior cerebellar arteries: Normal. --Posterior cerebral arteries: Normal. ANTERIOR CIRCULATION: --Intracranial internal carotid arteries: The right ICA is occluded. --Anterior cerebral arteries (ACA): Anterior communicating artery appears discontinuous, but flow related enhancement in the right MCA suggested is patent. Otherwise normal anterior cerebral arteries. --Middle cerebral arteries (MCA): Normal. ANATOMIC VARIANTS: None IMPRESSION: 1. Small, subcentimeter focus of acute/subacute ischemia in the right parietal white matter. No hemorrhage or mass effect. 2. Occlusion of the right ICA. 3. Likely high grade stenosis of the right ACA distal A1 segment and anterior communicating artery. This could be confirmed with CTA if clinically indicated. 4. Chronic small vessel disease and volume loss. Electronically Signed   By: Ulyses Jarred M.D.   On:  05/14/2021 22:43   MR BRAIN WO CONTRAST  Result Date: 05/14/2021 CLINICAL DATA:  Left-sided numbness and tingling EXAM: MRI HEAD WITHOUT CONTRAST MRA HEAD WITHOUT CONTRAST TECHNIQUE: Multiplanar, multi-echo pulse sequences of the brain and surrounding structures were acquired without intravenous contrast. Angiographic images of the Circle of Willis were acquired using MRA technique without intravenous contrast. COMPARISON:  No pertinent prior exam. FINDINGS: MRI HEAD FINDINGS Brain: 8 diffusion abnormality in the right parietal white matter, possibly subacute ischemia. No other diffusion abnormality. Focus of chronic microhemorrhage in the right cerebellum. Hyperintense T2-weighted signal is moderately widespread throughout the white matter. Generalized volume loss without a clear lobar predilection. The midline structures are normal. Vascular: Abnormal right ICA flow void Skull and upper cervical spine: Normal calvarium and skull base. Visualized upper cervical spine and soft tissues are normal. Sinuses/Orbits:No paranasal sinus fluid levels or advanced mucosal thickening. No mastoid or middle ear effusion. Normal orbits. MRA HEAD FINDINGS POSTERIOR CIRCULATION: --Vertebral arteries: Normal --Inferior cerebellar arteries: Normal. --Basilar artery: Normal. --Superior cerebellar arteries: Normal. --Posterior cerebral arteries: Normal. ANTERIOR CIRCULATION: --Intracranial internal carotid arteries: The right ICA is occluded. --Anterior cerebral arteries (ACA): Anterior communicating artery appears discontinuous, but flow related enhancement in the right MCA suggested is patent. Otherwise normal anterior cerebral  arteries. --Middle cerebral arteries (MCA): Normal. ANATOMIC VARIANTS: None IMPRESSION: 1. Small, subcentimeter focus of acute/subacute ischemia in the right parietal white matter. No hemorrhage or mass effect. 2. Occlusion of the right ICA. 3. Likely high grade stenosis of the right ACA distal A1 segment  and anterior communicating artery. This could be confirmed with CTA if clinically indicated. 4. Chronic small vessel disease and volume loss. Electronically Signed   By: Ulyses Jarred M.D.   On: 05/14/2021 22:43   DG Chest Portable 1 View  Result Date: 05/19/2021 CLINICAL DATA:  Generalized weakness. EXAM: PORTABLE CHEST 1 VIEW COMPARISON:  May 15, 2021 FINDINGS: The lungs are hyperinflated. Predominant stable, diffusely increased interstitial lung markings are noted. Mild atelectasis is seen within the bilateral lung bases. There is no evidence of a pleural effusion or pneumothorax. The cardiac silhouette is moderately enlarged and unchanged in size. Chronic right-sided rib fractures are seen. IMPRESSION: 1. Stable cardiomegaly with mild interstitial edema. 2. Mild, stable bibasilar atelectasis. Electronically Signed   By: Virgina Norfolk M.D.   On: 05/19/2021 00:34    ECG & Cardiac Imaging    RSR, 83, 1st deg AVB, LAD, LAFB, RBBB, LVH - no acute changes - personally reviewed.  Assessment & Plan    1.  Demand ischemia vs. NSTEMI:  Pt w/o prior cardiac hx but w/ history of CVD/carotid dzs, recent CVA, HTN, HL, and diabetes.  D/c'd from [facility] 3/5 after CVA admission and has since been very weak @ home - unable to get out of bed.  Appetite also poor.   Son brought her back to the ED on 3/7.  Here, HsTrop elevated @ 500  689  607  695.  ECG - RSR, 83, 1st deg AVB, LAD, LAFB, RBBB, LVH - no acute changes.  CTA neg for PE, though mod-sev 3V CAD noted.  EF nl on last admission.  She denies any history of chest pain or worsening of baseline degree of dyspnea prior to admission.  We discussed options for mgmt today and given her multiple comorbidities and advanced age, we collectively agreed to pursue a conservative approach w/ medical mgmt.  Cont home doses of asa/plavix/statin.  Add low-dose ? blocker.  2.  Essential HTN:  stable.    3.  HL:  LDL of 92 last week.  Adding statin in setting of #1/known  cerebrovascular dzs.  4.  DMII:  per IM.  5.  Normocytic anemia:  stable.  Follow on heparin.  6.  Recent CVA/Carotid & cerebrovascular dzs:  Residual left sided numbness.  Generalized wkns.  Cont asa/plavix.  Add statin.  PT eval - pt prev refused SNF.  Signed, Murray Hodgkins, NP 05/19/2021, 12:25 PM  For questions or updates, please contact   Please consult [email or web address] for contact info under Cardiology/STEMI.

## 2021-05-19 NOTE — ED Notes (Signed)
Pt to radiology at this time.

## 2021-05-19 NOTE — Progress Notes (Signed)
ANTICOAGULATION CONSULT NOTE ? ?Pharmacy Consult for heparin infusion ?Indication: ACS/STEMI ? ?Allergies  ?Allergen Reactions  ? Adhesive [Tape] Other (See Comments)  ?  "pulls my skin off" paper tape ok  ? Lyrica [Pregabalin]   ?  Body spasms  ? ? ?Patient Measurements: ?Height: 5\' 2"  (157.5 cm) ?Weight: 73 kg (161 lb) ?IBW/kg (Calculated) : 50.1 ?Heparin Dosing Weight: 65.7 kg ? ?Vital Signs: ?Temp: 99.6 ?F (37.6 ?C) (03/08 0029) ?Temp Source: Rectal (03/08 0029) ?BP: 126/80 (03/08 0230) ?Pulse Rate: 84 (03/08 0230) ? ?Labs: ?Recent Labs  ?  05/16/21 ?0629 05/18/21 ?2115 05/19/21 ?0002 05/19/21 ?0159  ?HGB 7.5* 8.0*  --   --   ?HCT 26.7* 29.1*  --   --   ?PLT 261 251  --   --   ?APTT  --   --  37*  --   ?LABPROT  --   --  14.0  --   ?INR  --   --  1.1  --   ?CREATININE 1.00 0.94  --   --   ?TROPONINIHS  --  500*  --  689*  ? ? ?Estimated Creatinine Clearance: 35.7 mL/min (by C-G formula based on SCr of 0.94 mg/dL). ? ? ?Medical History: ?Past Medical History:  ?Diagnosis Date  ? Anemia   ? Depression   ? Diabetes mellitus without complication (HCC)   ? GERD (gastroesophageal reflux disease)   ? History of COVID-19   ? Hyperlipidemia   ? Hypertension   ? Legally blind   ? Macular degeneration, bilateral   ? Peripheral neuropathy   ? Post herpetic neuralgia   ? located in her back  ? Stroke [facility])   ? ? ?Assessment: ?Pt is 88 yo female presenting to ED with generalized weakness, found with elevated troponin I, trending up. ?Goal of Therapy:  ?Heparin level 0.3-0.7 units/ml ?Monitor platelets by anticoagulation protocol: Yes ?  ?Plan:  ?Bolus 4000 units x 1 ?Start heparin infusion at 850 units/hr ?Check HL in 8 hr after start of infusion ?CBC daily while on heparin. ? ?99, PharmD, MBA ?05/19/2021 ?3:13 AM ? ? ? ?

## 2021-05-19 NOTE — Assessment & Plan Note (Addendum)
Continue aspirin, Plavix and statins. ?Therapies evaluated and recommend home with home health services ?

## 2021-05-19 NOTE — ED Notes (Signed)
Pt's purewick became dislodged, bed linen changed, new chux and brief placed as well as new purewick, pt educated to try and keep as still as possible to prevent dislodgment of purewick ?

## 2021-05-19 NOTE — Assessment & Plan Note (Addendum)
It appears that patient may have been on oxygen 2 L/min at SNF.  Per patient's son, she was at the facility for about 20 days. ?Son does indicate that patient has home oxygen but she does not use it regularly. ?Nursing just reassessed and patient does qualify for home oxygen. ?Although earlier patient declined home oxygen, currently agreeable if she has to use it. ?Last night, on ambulating to the bathroom and back without oxygen, her oxygen saturations was down to 63% which improved to 92% on 2 L/min oxygen. ?

## 2021-05-19 NOTE — H&P (Addendum)
History and Physical    Patient: Judy Harvey HGD:[id number] DOB: 1927/12/22 DOA: 05/18/2021 DOS: the patient was seen and examined on 05/19/2021 PCP: Mick Sell, MD  Patient coming from: SNF  Chief Complaint:  Chief Complaint  Patient presents with   Weakness    HPI: Judy Harvey is a 88 y.o. female with medical history significant for DM, anemia, HTN, history of esophageal stricture, hospitalized from 3/3 through 3/5 with acute CVA, chronic respiratory failure on home O2 at 2 L, legal blindness secondary to macular degeneration, iron deficiency anemia on Venofer, last administered on 3/5 for hemoglobin 7.5 who presents to the ED for generalized weakness.  Patient reports that she has been eating and drinking very little due to decreased appetite and feels like she has been having difficulty swallowing feeling like she is sometimes choking on her food.  She denies nausea, vomiting, abdominal pain or diarrhea and denies cough or shortness of breath. ED course: BP 169/62 with otherwise normal vitals saturating at 95% on room air Blood work: Hemoglobin 8.0 up from 7.5 during recent hospitalization.  Troponin 500, BNP 400.  Urinalysis unremarkable. EKG, personally viewed and interpreted: NSR at 85 with RBBB and LAFB CT angio PE protocol significant for: Bronchitis in the upper and lower lobes with bilateral lower lobe mucous plugging and peripheral ground-glass infiltrates in the right upper and middle lobes most likely reflecting multilobar pneumonitis. Follow-up as indicated Patient was initially given a fluid bolus as she appeared dehydrated.  Subsequently given a dose of Lasix, chewable aspirin and hospitalist consulted for admission.    Review of Systems: As mentioned in the history of present illness. All other systems reviewed and are negative. Past Medical History:  Diagnosis Date   Anemia    Depression    Diabetes mellitus without complication (HCC)    GERD  (gastroesophageal reflux disease)    History of COVID-19    Hyperlipidemia    Hypertension    Legally blind    Macular degeneration, bilateral    Peripheral neuropathy    Post herpetic neuralgia    located in her back   Stroke [facility])    Past Surgical History:  Procedure Laterality Date   ABDOMINAL HYSTERECTOMY     partial   APPENDECTOMY     BREAST SURGERY     reduction   CAROTID PTA/STENT INTERVENTION Left 10/18/2017   Procedure: CAROTID PTA/STENT INTERVENTION;  Surgeon: Annice Needy, MD;  Location: [facility] INVASIVE CV LAB;  Service: Cardiovascular;  Laterality: Left;   CATARACT EXTRACTION, BILATERAL     CESAREAN SECTION     x3   COLONOSCOPY     ESOPHAGEAL DILATION     Social History:  reports that she quit smoking about 15 years ago. Her smoking use included cigarettes. She has a 150.00 pack-year smoking history. She has never used smokeless tobacco. She reports that she does not drink alcohol and does not use drugs.  Allergies  Allergen Reactions   Adhesive [Tape] Other (See Comments)    "pulls my skin off" paper tape ok   Lyrica [Pregabalin]     Body spasms    Family History  Problem Relation Age of Onset   Healthy Mother    Diabetes Father    Alzheimer's disease Father    Multiple sclerosis Sister    Multiple sclerosis Brother     Prior to Admission medications   Medication Sig Start Date End Date Taking? Authorizing Provider  acetaminophen (TYLENOL) 325 MG tablet Take  650 mg by mouth every 6 (six) hours as needed for mild pain or fever.   Yes [provider]  albuterol (ACCUNEB) 1.25 MG/3ML nebulizer solution Take 3 mLs by nebulization every 6 (six) hours as needed for wheezing or shortness of breath. 11/12/19  Yes [provider]  albuterol (VENTOLIN HFA) 108 (90 Base) MCG/ACT inhaler Inhale 2 puffs into the lungs every 6 (six) hours as needed for shortness of breath or wheezing. 11/06/19  Yes [provider]  aspirin EC 81 MG tablet Take 81  mg by mouth daily.   Yes [provider]  clobetasol ointment (TEMOVATE) 0.05 % Apply 1 application topically 2 (two) times daily.   Yes [provider]  clopidogrel (PLAVIX) 75 MG tablet Take 1 tablet by mouth daily. TAKE 1 TABLET BY MOUTH EVERY DAY 02/12/21  Yes [provider]  DULoxetine (CYMBALTA) 30 MG capsule TAKE 1 CAPSULE BY MOUTH EVERY DAY Patient taking differently: Take 30 mg by mouth daily. 08/21/18  Yes Duanne Limerick, MD  gabapentin (NEURONTIN) 300 MG capsule Take 300 mg by mouth daily. For 90 days 05/04/21 08/02/21 Yes [provider]  insulin glargine (LANTUS) 100 UNIT/ML injection Inject 0.4 mLs (40 Units total) into the skin daily. INJECT 50 UNITS INTO THE SKIN DAILY AT 10 PM. 03/19/21  Yes Wouk, Wilfred Curtis, MD  Melatonin 3 MG CAPS Take by mouth. Take 3 mg by mouth at bedtime 01/28/21  Yes [provider]  metFORMIN (GLUCOPHAGE-XR) 500 MG 24 hr tablet Take 500 mg by mouth 2 (two) times daily. 03/23/21  Yes [provider]  nystatin (MYCOSTATIN/NYSTOP) powder Apply topically 2 (two) times daily. Apply topically 2 (two) times daily 01/29/21 01/29/22 Yes [provider]  ondansetron (ZOFRAN) 8 MG tablet Take 8 mg by mouth every 8 (eight) hours as needed for nausea or vomiting.    Yes [provider]  pantoprazole (PROTONIX) 40 MG tablet Take by mouth. Take 1 tablet (40 mg total) by mouth 2 (two) times daily before meals 02/23/21 02/23/22 Yes [provider]  promethazine (PHENERGAN) 25 MG tablet Take 25 mg by mouth every 8 (eight) hours as needed. 04/06/21  Yes [provider]  traZODone (DESYREL) 50 MG tablet Take 50 mg by mouth at bedtime. 05/04/21  Yes [provider]  atorvastatin (LIPITOR) 80 MG tablet TAKE 1 TABLET BY MOUTH EVERYDAY AT BEDTIME Patient taking differently: Take 80 mg by mouth at bedtime. 08/21/18   Duanne Limerick, MD  Cholecalciferol (VITAMIN D3) 5000 units CAPS Take 1  capsule (5,000 Units total) by mouth daily. 06/28/16   Plonk, Chrissie Noa, MD  ferrous sulfate 325 (65 FE) MG tablet Take 325 mg by mouth daily.    [provider]  Insulin Syringe-Needle U-100 (B-D INS SYR ULTRAFINE .3CC/31G) 31G X 5/16" 0.3 ML MISC Use to administer insulin daily to control diabetes. 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 08/21/18   Duanne Limerick, MD  mirtazapine (REMERON) 7.5 MG tablet Take by mouth. Take 1 tablet (7.5 mg total) by mouth at bedtime 02/25/21 02/25/22  [provider]    Physical Exam: Vitals:   05/18/21 2117 05/19/21 0029 05/19/21 0030 05/19/21 0230  BP: (!) 169/62 131/82 (!) 141/73 126/80  Pulse: 85 88 79 84  Resp: 20 17 17  (!) 26  Temp: 98.4 F (36.9 C) 99.6 F (37.6 C)    TempSrc: Oral Rectal    SpO2: 95% 97% 97% 100%  Weight: 73 kg  Height:  (1.575 m)      Physical Exam Vitals and nursing note reviewed.  Constitutional:      General: She is not in acute distress.    Appearance: She is obese.  HENT:     Head: Normocephalic and atraumatic.  Cardiovascular:     Rate and Rhythm: Normal rate and regular rhythm.     Pulses: Normal pulses.     Heart sounds: Normal heart sounds. No murmur heard. Pulmonary:     Effort: Pulmonary effort is normal.     Breath sounds: Normal breath sounds. No wheezing or rhonchi.  Abdominal:     General: Bowel sounds are normal.     Palpations: Abdomen is soft.     Tenderness: There is no abdominal tenderness.  Musculoskeletal:        General: No swelling or tenderness. Normal range of motion.     Cervical back: Normal range of motion and neck supple.  Skin:    General: Skin is warm and dry.  Neurological:     General: No focal deficit present.     Mental Status: She is alert. Mental status is at baseline.  Psychiatric:        Mood and Affect: Mood normal.        Behavior: Behavior normal.     Data Reviewed: Relevant notes from primary care and  specialist visits, past discharge summaries as available in EHR, including Care Everywhere. Prior diagnostic testing as pertinent to current admission diagnoses Updated medications and problem lists for reconciliation ED course, including vitals, labs, imaging, treatment and response to treatment Triage notes, nursing and pharmacy notes and ED provider's notes Notable results as noted in HPI   Assessment and Plan: * Generalized weakness Secondary to chronic physical deconditioning, recent stroke, acute illness Physical therapy eval  Elevated troponin Troponin 500 with no complaints of chest pain and nonacute EKG Possible elevation related to recent CVA a few days prior Continue to trend and if significant delta we will treat as NSTEMI and consult cardiology  Pneumonitis Uncertain etiology but some concern for aspiration pneumonitis given history of esophageal stricture and patient's complaints of dysphagia, in the setting of recent stroke We will get procalcitonin Rocephin for aspiration pending procalcitonin We will keep n.p.o. overnight Speech therapy for swallow eval in the a.m.  Recent cerebrovascular accident 05/14/21 (CVA) Resume aspirin, Plavix and atorvastatin in the a.m. pending swallow eval  Type 2 diabetes mellitus without complication, with long-term current use of insulin (HCC) Basal insulin with sliding scale coverage while n.p.o.  Iron deficiency anemia On Venofer Hemoglobin at 8 which is improved from 7.5 3 days prior  COPD (chronic obstructive pulmonary disease) (HCC) DuoNebs as needed  Chronic respiratory failure with hypoxia (HCC) Oxygen requirement at baseline.  Continue  History of esophageal stricture Speech therapy consult and can consider GI eval       Advance Care Planning:   Code Status: Full Code   Consults: none  Family Communication: none  Severity of Illness: The appropriate patient status for this patient is INPATIENT. Inpatient  status is judged to be reasonable and necessary in order to provide the required intensity of service to ensure the patient's safety. The patient's presenting symptoms, physical exam findings, and initial radiographic and laboratory data in the context of their chronic comorbidities is felt to place them at high risk for further clinical deterioration. Furthermore, it is not anticipated that the patient will be medically stable for discharge from the hospital  within 2 midnights of admission.   * I certify that at the point of admission it is my clinical judgment that the patient will require inpatient hospital care spanning beyond 2 midnights from the point of admission due to high intensity of service, high risk for further deterioration and high frequency of surveillance required.*  Author: Andris BaumannHazel V Jermarcus Mcfadyen, MD 05/19/2021 3:32 AM  For on call review [email or web address].

## 2021-05-19 NOTE — Consult Note (Addendum)
ANTICOAGULATION CONSULT NOTE ? ?Pharmacy Consult for Lovenox ?Indication: VTE prophylaxis ? ?Patient Measurements: ?Height: 5\' 2"  (157.5 cm) ?Weight: 73 kg (161 lb) ?IBW/kg (Calculated) : 50.1 ? ?Labs: ?Recent Labs  ?  05/18/21 ?2115 05/19/21 ?0002 05/19/21 ?0159 05/19/21 ?0330 05/19/21 ?[id number] 05/19/21 ?0945  ?HGB 8.0*  --   --   --   --   --   ?HCT 29.1*  --   --   --   --   --   ?PLT 251  --   --   --   --   --   ?APTT  --  37*  --   --   --   --   ?LABPROT  --  14.0  --   --   --   --   ?INR  --  1.1  --   --   --   --   ?CREATININE 0.94  --   --   --   --   --   ?TROPONINIHS 500*  --    < > 607* 695* 696*  ? < > = values in this interval not displayed.  ? ? ?Estimated Creatinine Clearance: 35.7 mL/min (by C-G formula based on SCr of 0.94 mg/dL). ? ? ?Medical History: ?Past Medical History:  ?Diagnosis Date  ? Anemia   ? Depression   ? Diabetes mellitus without complication ([place])   ? GERD (gastroesophageal reflux disease)   ? History of COVID-19   ? Hyperlipidemia   ? Hypertension   ? Legally blind   ? Macular degeneration, bilateral   ? Peripheral neuropathy   ? Post herpetic neuralgia   ? located in her back  ? Stroke [facility])   ? ? ?Medications:  ?Patient ordered IV heparin for elevated troponin / ACS work-up. Ultimately this was discontinued given anemia and decision for conservative management. Does not appear heparin was ever administered. ? ?Assessment: ?88 y/o F with medical history as above admitted for generalized weakness / ACS work-up / pneumonitis. Pharmacy consulted for Lovenox for DVT prophylaxis. ? ?Plan:  ?--Lovenox 40 mg HS ?--Scr / CBC per protocol ? ?Benita Gutter ?05/19/2021,7:07 PM ? ? ?

## 2021-05-19 NOTE — Assessment & Plan Note (Addendum)
Stable without clinical bronchospasm. ?

## 2021-05-19 NOTE — Assessment & Plan Note (Signed)
Generalized weakness.  No chest pain or acute EKG changes but troponin 500> 689 ?Chewable aspirin given ?Continue heparin drip ?Echo to evaluate for wall motion abnormality ?Cardiology consulted ?

## 2021-05-19 NOTE — Assessment & Plan Note (Addendum)
Secondary to chronic physical deconditioning, recent stroke, acute illness and advanced age ?Home with home health services ?Discussed in detail with patient's son today who expressed agreement. ?

## 2021-05-19 NOTE — Assessment & Plan Note (Addendum)
A1c 6.8 on 05/15/2021 ?CBGs quite reasonably controlled in the hospital without use of any insulins. ?Discontinued Lantus which she was on high-dose PTA but recommend continued close monitoring of CBG 3 times daily before meals and at bedtime.  If CBGs persistently high >180 then may need to resume Lantus. ?For now continue prior dose of metformin ?

## 2021-05-19 NOTE — Progress Notes (Signed)
PROGRESS NOTE   Judy Harvey  ZOX:[id number]    DOB: 14-Dec-1927    DOA: 05/18/2021  PCP: Mick SellFitzgerald, David P, MD   I have briefly reviewed patients [facility] medical [facility].  Chief Complaint  Patient presents with   Weakness    Hospital Course:  88 year old female with medical history significant for DM, anemia, HTN, esophageal stricture, hospitalized 3/3 - 3/5 with acute CVA, chronic respiratory failure on home oxygen chronic respiratory failure on home oxygen 2 L/min, legal blindness secondary to macular degeneration, hard of hearing using hearing aids, iron deficiency anemia, hospitalized 3/3 - 3/5 for acute CVA, presented to the ED for generalized weakness, poor oral intake, difficulty swallowing.  Admitted for generalized weakness, elevated troponin and pneumonitis.  Cardiology consulted.   Assessment & Plan:  Principal Problem:   Generalized weakness Active Problems:   Pneumonitis   Elevated troponin   Recent cerebrovascular accident 05/14/21 (CVA)   Type 2 diabetes mellitus without complication, with long-term current use of insulin (HCC)   Iron deficiency anemia   History of esophageal stricture   Chronic respiratory failure with hypoxia (HCC)   COPD (chronic obstructive pulmonary disease) (HCC)   Assessment and Plan: * Generalized weakness Secondary to chronic physical deconditioning, recent stroke, acute illness Physical therapy eval is still pending.  Elevated troponin Troponin 500 with no complaints of chest pain and nonacute EKG Possible elevation related to recent CVA a few days prior Cardiology consulted and indicated demand ischemia versus NSTEMI.  Troponins up to 695. CTA negative for PE. No anginal symptoms. They discussed management options and final decision was made to pursue conservative approach with medical management.  Continuing aspirin, Plavix, statins and low-dose beta-blockers were added.  Pneumonitis Uncertain etiology  but some concern for aspiration pneumonitis given history of esophageal stricture and patient's complaints of dysphagia, in the setting of recent stroke Procalcitonin negative. Given concern for aspiration, IV Zosyn was switched over to Unasyn. Speech therapy evaluated and recommend regular consistency diet and thin liquids.  Recent cerebrovascular accident 05/14/21 (CVA) Continue aspirin, Plavix and statins.  Therapies evaluation pending.  Type 2 diabetes mellitus without complication, with long-term current use of insulin (HCC) Continue basal insulin and SSI. Reasonable inpatient control  Iron deficiency anemia On Venofer Hemoglobin at 8 which is improved from 7.5 3 days prior Follow CBC in AM.  COPD (chronic obstructive pulmonary disease) (HCC) DuoNebs as needed.  No clinical bronchospasm.  Chronic respiratory failure with hypoxia (HCC) Oxygen requirement at baseline.  Continue 3 L/min [place] oxygen for  History of esophageal stricture As per speech therapy, regular consistency diet and thin liquids.  May consider outpatient GI evaluation       Body mass index is 29.45 kg/m.    DVT prophylaxis:   We will start Lovenox.   Code Status: Full Code:  Family Communication: None at bedside. Disposition:  Status is: Inpatient Remains inpatient appropriate because: Severity of presentation, IV antibiotics.    Consultants:   Cardiology.  Procedures:     Antimicrobials:    As above.  Subjective:  Seen this morning in the ED.  Indicates 2 days history of no oral intake.  Currently wants to have frosted flakes for dinner and ice cold water.  Denies dyspnea.  No chest pain.  Objective:   Vitals:   05/19/21 1325 05/19/21 1400 05/19/21 1557 05/19/21 1702  BP: (!) 105/42 122/84 (!) 103/55 (!) 105/92  Pulse: 73 64 80 75  Resp:  18 20  Temp:   98.1 F (36.7 C) 97.8 F (36.6 C)  TempSrc:   Oral Oral  SpO2:  96% 99% 100%  Weight:      Height:        General exam:  Elderly female, moderately built and nourished lying comfortably propped up in bed without distress.  Oral mucosa dry. Respiratory system: Clear to auscultation. Respiratory effort normal. Cardiovascular system: S1 & S2 heard, RRR. No JVD, murmurs, rubs, gallops or clicks. No pedal edema. Gastrointestinal system: Abdomen is nondistended, soft and nontender. No organomegaly or masses felt. Normal bowel sounds heard. Central nervous system: Alert and oriented. No focal neurological deficits. Extremities: Symmetric 5 x 5 power. Skin: No rashes, lesions or ulcers Psychiatry: Judgement and insight appear normal. Mood & affect appropriate.     Data Reviewed:   I have personally reviewed following labs and imaging studies   CBC: Recent Labs  Lab 05/14/21 1450 05/16/21 0629 05/18/21 2115  WBC 10.0 7.9 8.7  NEUTROABS 8.0*  --   --   HGB 8.3* 7.5* 8.0*  HCT 29.5* 26.7* 29.1*  MCV 89.7 90.2 91.5  PLT 322 261 251    Basic Metabolic Panel: Recent Labs  Lab 05/14/21 1450 05/16/21 0629 05/18/21 2115  NA 139 144 140  K 3.3* 4.1 4.3  CL 102 106 99  CO2 29 31 32  GLUCOSE 152* 88 121*  BUN 25* 22 15  CREATININE 1.02* 1.00 0.94  CALCIUM 8.5* 8.3* 8.6*  MG  --  2.2  --     Liver Function Tests: Recent Labs  Lab 05/14/21 1450 05/18/21 2115  AST 12* 15  ALT 8 8  ALKPHOS 70 63  BILITOT 0.3 0.3  PROT 6.4* 6.1*  ALBUMIN 3.0* 2.7*    CBG: Recent Labs  Lab 05/16/21 1628 05/19/21 0339 05/19/21 1406  GLUCAP 141* 102* 108*    Microbiology Studies:   Recent Results (from the past 240 hour(s))  Resp Panel by RT-PCR (Flu A&B, Covid) Nasopharyngeal Swab     Status: None   Collection Time: 05/14/21  9:17 PM   Specimen: Nasopharyngeal Swab; Nasopharyngeal(NP) swabs in vial transport medium  Result Value Ref Range Status   SARS Coronavirus 2 by RT PCR NEGATIVE NEGATIVE Final    Comment: (NOTE) SARS-CoV-2 target nucleic acids are NOT DETECTED.  The SARS-CoV-2 RNA is generally  detectable in upper respiratory specimens during the acute phase of infection. The lowest concentration of SARS-CoV-2 viral copies this assay can detect is 138 copies/mL. A negative result does not preclude SARS-Cov-2 infection and should not be used as the sole basis for treatment or other patient management decisions. A negative result may occur with  improper specimen collection/handling, submission of specimen other than nasopharyngeal swab, presence of viral mutation(s) within the areas targeted by this assay, and inadequate number of viral copies(<138 copies/mL). A negative result must be combined with clinical observations, patient history, and epidemiological information. The expected result is Negative.  Fact Sheet for Patients:  BloggerCourse.com  Fact Sheet for Healthcare Providers:  SeriousBroker.it  This test is no t yet approved or cleared by the [place] FDA and  has been authorized for detection and/or diagnosis of SARS-CoV-2 by FDA under an Emergency Use Authorization (EUA). This EUA will remain  in effect (meaning this test can be used) for the duration of the COVID-19 declaration under Section 564(b)(1) of the Act, 21 U.S.C.section 360bbb-3(b)(1), unless the authorization is terminated  or revoked sooner.  Influenza A by PCR NEGATIVE NEGATIVE Final   Influenza B by PCR NEGATIVE NEGATIVE Final    Comment: (NOTE) The Xpert Xpress SARS-CoV-2/FLU/RSV plus assay is intended as an aid in the diagnosis of influenza from Nasopharyngeal swab specimens and should not be used as a sole basis for treatment. Nasal washings and aspirates are unacceptable for Xpert Xpress SARS-CoV-2/FLU/RSV testing.  Fact Sheet for Patients: BloggerCourse.com  Fact Sheet for Healthcare Providers: SeriousBroker.it  This test is not yet approved or cleared by the [place] FDA  and has been authorized for detection and/or diagnosis of SARS-CoV-2 by FDA under an Emergency Use Authorization (EUA). This EUA will remain in effect (meaning this test can be used) for the duration of the COVID-19 declaration under Section 564(b)(1) of the Act, 21 U.S.C. section 360bbb-3(b)(1), unless the authorization is terminated or revoked.  Performed at [facility], [address] [place], [place] [address]   Resp Panel by RT-PCR (Flu A&B, Covid) Nasopharyngeal Swab     Status: None   Collection Time: 05/19/21 12:02 AM   Specimen: Nasopharyngeal Swab; Nasopharyngeal(NP) swabs in vial transport medium  Result Value Ref Range Status   SARS Coronavirus 2 by RT PCR NEGATIVE NEGATIVE Final    Comment: (NOTE) SARS-CoV-2 target nucleic acids are NOT DETECTED.  The SARS-CoV-2 RNA is generally detectable in upper respiratory specimens during the acute phase of infection. The lowest concentration of SARS-CoV-2 viral copies this assay can detect is 138 copies/mL. A negative result does not preclude SARS-Cov-2 infection and should not be used as the sole basis for treatment or other patient management decisions. A negative result may occur with  improper specimen collection/handling, submission of specimen other than nasopharyngeal swab, presence of viral mutation(s) within the areas targeted by this assay, and inadequate number of viral copies(<138 copies/mL). A negative result must be combined with clinical observations, patient history, and epidemiological information. The expected result is Negative.  Fact Sheet for Patients:  BloggerCourse.com  Fact Sheet for Healthcare Providers:  SeriousBroker.it  This test is no t yet approved or cleared by the [place] FDA and  has been authorized for detection and/or diagnosis of SARS-CoV-2 by FDA under an Emergency Use Authorization (EUA). This EUA will remain  in effect  (meaning this test can be used) for the duration of the COVID-19 declaration under Section 564(b)(1) of the Act, 21 U.S.C.section 360bbb-3(b)(1), unless the authorization is terminated  or revoked sooner.       Influenza A by PCR NEGATIVE NEGATIVE Final   Influenza B by PCR NEGATIVE NEGATIVE Final    Comment: (NOTE) The Xpert Xpress SARS-CoV-2/FLU/RSV plus assay is intended as an aid in the diagnosis of influenza from Nasopharyngeal swab specimens and should not be used as a sole basis for treatment. Nasal washings and aspirates are unacceptable for Xpert Xpress SARS-CoV-2/FLU/RSV testing.  Fact Sheet for Patients: BloggerCourse.com  Fact Sheet for Healthcare Providers: SeriousBroker.it  This test is not yet approved or cleared by the [place] FDA and has been authorized for detection and/or diagnosis of SARS-CoV-2 by FDA under an Emergency Use Authorization (EUA). This EUA will remain in effect (meaning this test can be used) for the duration of the COVID-19 declaration under Section 564(b)(1) of the Act, 21 U.S.C. section 360bbb-3(b)(1), unless the authorization is terminated or revoked.  Performed at [facility], [address] [place], [place] [address]     Radiology Studies:  CT Angio Chest PE W and/or Wo Contrast  Result Date:  05/19/2021 CLINICAL DATA:  Pulmonary embolism suspected. Generalized weakness with decreased appetite. EXAM: CT ANGIOGRAPHY CHEST WITH CONTRAST TECHNIQUE: Multidetector CT imaging of the chest was performed using the standard protocol during bolus administration of intravenous contrast. Multiplanar CT image reconstructions and MIPs were obtained to evaluate the vascular anatomy. RADIATION DOSE REDUCTION: This exam was performed according to the departmental dose-optimization program which includes automated exposure control, adjustment of the mA and/or kV according to patient size  and/or use of iterative reconstruction technique. CONTRAST:  15mL OMNIPAQUE IOHEXOL 350 MG/ML SOLN COMPARISON:  Chest PA and lateral from earlier today, PA and lateral chest 05/15/2021, chest CTA 11/06/2019 FINDINGS: Cardiovascular: There is mild panchamber cardiomegaly with increased septal and left ventricular wall hypertrophy. The pulmonary veins are decompressed. There is moderate to heavy three-vessel calcific CAD. There is minimal superior recess pericardial effusion. Pulmonary arteries are normal in caliber without appreciable embolic filling defects. There is chronic lipomatous hypertrophy of the inter-atrial septum. No findings of acute right heart strain. Moderate aortic atherosclerosis is seen with aortic tortuosity and normal great vessel branching and opacification. There is no aneurysm or dissection. Mediastinum/Nodes: Moderate-sized hiatal hernia. Thyroid gland mildly prominent with dystrophic calcifications in the left lobe and several small nodules, but not well seen due to artifact from the patient's superimposed right arm and contrast in the left subclavian vein. There are slightly prominent right paratracheal lymph nodes, stable. There is no further intrathoracic adenopathy, no hilar or axillary adenopathy, no tracheal filling defects or focal esophageal mass. Lungs/Pleura: Central bronchial thickening again is noted in both upper and lower lobes. There is mucous plugging in several bilateral posterior basal lower lobe small bronchi. Peripheral hazy opacities noted in the right upper lobe base and right middle lobe laterally most likely representing mild pneumonitis. Other widespread tree-in-bud interstitial changes noted on the prior study are not seen today. No subpleural interstitial septal edema is seen. Reticular scarring at the apices is again shown. There are small bilateral layering pleural effusions symmetrically, with adjacent compressive atelectasis in the lower lobes. Upper Abdomen:  No acute abnormality. 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 There are subtle capsular nodular changes in the left lobe of the liver consistent with early cirrhosis but there is no splenomegaly, ascites or portal vein dilatation. Musculoskeletal: Osteopenia and degenerative change thoracic spine. There are healed fracture deformities of some of the anterolateral right ribs. There is a T12 upper plate anterior wedge compression fracture with 30% anterior and 15% posterior vertebral height loss with 4 mm posterosuperior cortical retropulsion and no paraspinal hematoma. This is new from 11/06/2019 but age indeterminate. Other vertebrae are normal in heights. The pedicles and posterior elements are intact. Review of the MIP images confirms the above findings. IMPRESSION: 1. No evidence of arterial dilatation or emboli. 2. Aortic atherosclerosis without aneurysm or dissection. Coronary artery atherosclerosis. 3. Cardiomegaly without venous distention or subpleural edema. Small pleural effusions. 4. Bronchitis in the upper and lower lobes with bilateral lower lobe mucous plugging and peripheral ground-glass infiltrates in the right upper and middle lobes most likely reflecting multilobar pneumonitis. Follow-up as indicated. 5. Suspected early cirrhosis left lobe of the liver. No splenomegaly or portal dilatation. 6. Moderate-sized hiatal hernia. 7. Osteopenia with superior endplate anterior wedge compression fracture of T12 with retropulsion, new from 11/06/2019 but age indeterminate. No adjacent hematoma. 8. Additional chronic findings. Electronically Signed   By: Almira Bar M.D.   On: 05/19/2021 01:27  DG Chest Portable 1 View  Result Date: 05/19/2021 CLINICAL DATA:  Generalized weakness. EXAM: PORTABLE CHEST 1 VIEW COMPARISON:  May 15, 2021 FINDINGS: The lungs are hyperinflated. Predominant stable, diffusely increased interstitial lung markings are noted.  Mild atelectasis is seen within the bilateral lung bases. There is no evidence of a pleural effusion or pneumothorax. The cardiac silhouette is moderately enlarged and unchanged in size. Chronic right-sided rib fractures are seen. IMPRESSION: 1. Stable cardiomegaly with mild interstitial edema. 2. Mild, stable bibasilar atelectasis. Electronically Signed   By: Aram Candela M.D.   On: 05/19/2021 00:34   ECHOCARDIOGRAM LIMITED  Result Date: 05/19/2021    ECHOCARDIOGRAM LIMITED REPORT   Patient Name:   CHRISSY EALEY Date of Exam: 05/19/2021 Medical Rec #:  [id number]         Height:       62.0 in Accession #:    [id number]        Weight:       161.0 lb Date of Birth:  Jul 18, 1927         BSA:          1.743 m Patient Age:    88 years          BP:           116/53 mmHg Patient Gender: F                 HR:           72 bpm. Exam Location:  [facility] Procedure: Limited Echo, Limited Color Doppler and Cardiac Doppler Indications:     I21.4 NSTEMI  History:         Patient has prior history of Echocardiogram examinations, most                  recent 05/15/2021. Stroke; Risk Factors:Hypertension, Diabetes                  and Dyslipidemia.  Sonographer:     Humphrey Rolls Referring Phys:  [id number] Andris Baumann Diagnosing Phys: Sena Slate  Sonographer Comments: No parasternal window and suboptimal subcostal window. IMPRESSIONS  1. Left ventricular ejection fraction, by estimation, is 55 to 60%. The left ventricle has normal function. The left ventricle has no regional wall motion abnormalities.  2. Right ventricular systolic function is normal. The right ventricular size is normal.  3. The inferior vena cava is normal in size with greater than 50% respiratory variability, suggesting right atrial pressure of 3 mmHg. Conclusion(s)/Recommendation(s): INSUFFICIENT EXAM TO EVALUATE FOR VALVULAR PATHOLOGY. FINDINGS  Left Ventricle: Poor echocardiographic windows. Left ventricular ejection fraction, by estimation, is 55 to 60%. The  left ventricle has normal function. The left ventricle has no regional wall motion abnormalities. Right Ventricle: The right ventricular size is normal. Right ventricular systolic function is normal. Pericardium: There is no evidence of pericardial effusion. Venous: The inferior vena cava is normal in size with greater than 50% respiratory variability, suggesting right atrial pressure of 3 mmHg.  LV Volumes (MOD) LV vol d, MOD A2C: 77.9 ml LV vol d, MOD A4C: 68.7 ml LV vol s, MOD A2C: 31.4 ml LV vol s, MOD A4C: 16.5 ml LV SV MOD A2C:     46.5 ml LV SV MOD A4C:     68.7 ml LV SV MOD BP:      51.5 ml Sena Slate Electronically signed by Sena Slate Signature Date/Time: 05/19/2021/5:07:09 PM    Final     Scheduled  Meds:    [START ON 05/20/2021] aspirin EC  81 mg Oral Daily   atorvastatin  80 mg Oral Daily   clopidogrel  75 mg Oral Daily   metoprolol tartrate  12.5 mg Oral BID   pantoprazole (PROTONIX) IV  40 mg Intravenous Q24H    Continuous Infusions:    ampicillin-sulbactam (UNASYN) IV Stopped (05/19/21 1552)   lactated ringers 50 mL/hr at 05/19/21 1212     LOS: 0 days     Marcellus Scott, MD,  FACP, [facility], [facility], [facility] (Care Management Physician Certified) [facility] [place]  To contact the attending provider between 7A-7P or the covering provider during after hours 7P-7A, please log into the web site [email or web address] and access using universal [place] password for that web site. If you do not have the password, please call the hospital operator.  05/19/2021, 6:48 PM

## 2021-05-19 NOTE — Hospital Course (Addendum)
88 year old female with medical history significant for DM, anemia, HTN, esophageal stricture, hospitalized 3/3 - 3/5 with acute CVA, chronic respiratory failure on home oxygen chronic respiratory failure on home oxygen 2 L/min, legal blindness secondary to macular degeneration, hard of hearing using hearing aids, iron deficiency anemia, hospitalized 3/3 - 3/5 for acute CVA, presented to the ED for generalized weakness, poor oral intake, difficulty swallowing.  Admitted for generalized weakness, elevated troponin and pneumonitis.  Cardiology consulted, given advanced age, recent CVA and anemia, no invasive work-up planned and medical management advised.  Due to patient expressing thoughts of dying to nursing, suspecting depression, psychiatry consulted and input pending.  Therapies have evaluated and recommend home health services.  Psychiatry evaluated, no needs identified. ?

## 2021-05-19 NOTE — Assessment & Plan Note (Addendum)
As per speech therapy, regular consistency diet and thin liquids.  May consider outpatient GI evaluation ?

## 2021-05-19 NOTE — Assessment & Plan Note (Addendum)
On Venofer ?Stable in the high 7- low 8 g range. ?Periodically follow CBC as outpatient. ?

## 2021-05-20 LAB — CBC
HCT: 28 % — ABNORMAL LOW (ref 36.0–46.0)
Hemoglobin: 7.9 g/dL — ABNORMAL LOW (ref 12.0–15.0)
MCH: 25.6 pg — ABNORMAL LOW (ref 26.0–34.0)
MCHC: 28.2 g/dL — ABNORMAL LOW (ref 30.0–36.0)
MCV: 90.6 fL (ref 80.0–100.0)
Platelets: 233 10*3/uL (ref 150–400)
RBC: 3.09 MIL/uL — ABNORMAL LOW (ref 3.87–5.11)
RDW: 23.8 % — ABNORMAL HIGH (ref 11.5–15.5)
WBC: 8.2 10*3/uL (ref 4.0–10.5)
nRBC: 0 % (ref 0.0–0.2)

## 2021-05-20 LAB — BASIC METABOLIC PANEL
Anion gap: 10 (ref 5–15)
BUN: 14 mg/dL (ref 8–23)
CO2: 35 mmol/L — ABNORMAL HIGH (ref 22–32)
Calcium: 8.2 mg/dL — ABNORMAL LOW (ref 8.9–10.3)
Chloride: 97 mmol/L — ABNORMAL LOW (ref 98–111)
Creatinine, Ser: 1.12 mg/dL — ABNORMAL HIGH (ref 0.44–1.00)
GFR, Estimated: 46 mL/min — ABNORMAL LOW (ref >60)
Glucose, Bld: 136 mg/dL — ABNORMAL HIGH (ref 70–99)
Potassium: 3.7 mmol/L (ref 3.5–5.1)
Sodium: 142 mmol/L (ref 135–145)

## 2021-05-20 LAB — GLUCOSE, CAPILLARY
Glucose-Capillary: 134 mg/dL — ABNORMAL HIGH (ref 70–99)
Glucose-Capillary: 119 mg/dL — ABNORMAL HIGH (ref 70–99)
Glucose-Capillary: 159 mg/dL — ABNORMAL HIGH (ref 70–99)
Glucose-Capillary: 112 mg/dL — ABNORMAL HIGH (ref 70–99)
Glucose-Capillary: 157 mg/dL — ABNORMAL HIGH (ref 70–99)

## 2021-05-20 MED ORDER — SODIUM CHLORIDE 0.9 % IV SOLN
3.0000 g | Freq: Two times a day (BID) | INTRAVENOUS | Status: DC
  Administered 2021-05-20 – 2021-05-21 (×3): 3 g via INTRAVENOUS
  Filled 2021-05-20: qty 8
  Filled 2021-05-20: qty 3
  Filled 2021-05-20 (×2): qty 8
  Filled 2021-05-20: qty 3

## 2021-05-20 MED ORDER — POLYETHYLENE GLYCOL 3350 17 G PO PACK
17.0000 g | PACK | Freq: Every day | ORAL | Status: DC
Start: 2021-05-20 — End: 2021-05-22
  Administered 2021-05-20 – 2021-05-22 (×2): 17 g via ORAL
  Filled 2021-05-20 (×2): qty 1

## 2021-05-20 MED ORDER — BISACODYL 10 MG RE SUPP
10.0000 mg | Freq: Once | RECTAL | Status: DC
  Filled 2021-05-20: qty 1

## 2021-05-20 NOTE — Progress Notes (Signed)
PROGRESS NOTE   Judy Harvey  ZOX:[id number]    DOB: [birth year]    DOA: 05/18/2021  PCP: Mick Sell, MD   I have briefly reviewed patients previous medical records in [facility].  Chief Complaint  Patient presents with   Weakness    Hospital Course:  88 year old female with medical history significant for DM, anemia, HTN, esophageal stricture, hospitalized 3/3 - 3/5 with acute CVA, chronic respiratory failure on home oxygen chronic respiratory failure on home oxygen 2 L/min, legal blindness secondary to macular degeneration, hard of hearing using hearing aids, iron deficiency anemia, hospitalized 3/3 - 3/5 for acute CVA, presented to the ED for generalized weakness, poor oral intake, difficulty swallowing.  Admitted for generalized weakness, elevated troponin and pneumonitis.  Cardiology consulted.   Assessment & Plan:  Principal Problem:   Generalized weakness Active Problems:   Pneumonitis   Elevated troponin   Recent cerebrovascular accident 05/14/21 (CVA)   Type 2 diabetes mellitus without complication, with long-term current use of insulin (HCC)   Iron deficiency anemia   History of esophageal stricture   Depression   Chronic respiratory failure with hypoxia (HCC)   COPD (chronic obstructive pulmonary disease) (HCC)   Assessment and Plan: * Generalized weakness Secondary to chronic physical deconditioning, recent stroke, acute illness Physical therapy eval is still pending.  Elevated troponin Troponin 500 with no complaints of chest pain and nonacute EKG Possible elevation related to recent CVA a few days prior Cardiology consulted and indicated demand ischemia versus NSTEMI.  Troponins up to 695. CTA negative for PE. No anginal symptoms. They discussed management options and final decision was made to pursue conservative approach with medical management.  Continuing aspirin, Plavix, statins and low-dose beta-blockers were added. TTE shows LVEF 55-60%  with no LV wall motion abnormalities.  Pneumonitis Uncertain etiology but some concern for aspiration pneumonitis given history of esophageal stricture and patient's complaints of dysphagia, in the setting of recent stroke Procalcitonin negative. Given concern for aspiration, IV Zosyn was switched over to Unasyn. Speech therapy evaluated and recommend regular consistency diet and thin liquids.  Recent cerebrovascular accident 05/14/21 (CVA) Continue aspirin, Plavix and statins.  Therapies evaluation pending.  Type 2 diabetes mellitus without complication, with long-term current use of insulin (HCC) Continue basal insulin and SSI. Reasonable inpatient control  Iron deficiency anemia On Venofer Hemoglobin at 8 which is improved from 7.5 3 days prior Follow CBC in AM.  Stable.  COPD (chronic obstructive pulmonary disease) (HCC) DuoNebs as needed.  No clinical bronchospasm.  Chronic respiratory failure with hypoxia (HCC) Oxygen requirement at baseline.  Continue 3 L/min  oxygen for  Depression Patient reported to RN that she had stopped eating PTA for a couple days because she wanted to die/go up to the [place]. Denies specific suicidal ideations and nursing not concerned about this either. Psychiatry consulted.  History of esophageal stricture As per speech therapy, regular consistency diet and thin liquids.  May consider outpatient GI evaluation       Body mass index is 29.45 kg/m.    DVT prophylaxis: enoxaparin (LOVENOX) injection 40 mg Start: 05/19/21 2200  We will start Lovenox.   Code Status: Full Code:  Family Communication: None at bedside. Disposition:  Status is: Inpatient Remains inpatient appropriate because: Severity of presentation, IV antibiotics.    Consultants:   Cardiology.  Procedures:     Antimicrobials:    As above.  Subjective:  Hard of hearing.  Visually impaired.  Overall feels better.  Reports that she ate a banana and some thing else  for breakfast which was confirmed by nursing.  As per nursing, expressing thoughts of dying, thereby stopped eating PTA.  Objective:   Vitals:   05/20/21 0355 05/20/21 0723 05/20/21 1149 05/20/21 1630  BP: (!) 154/82 (!) 115/54 (!) 139/52 129/72  Pulse: 75 83 62 67  Resp: Temp: 97.7 F (36.5 C) 98.1 F (36.7 C) (!) 97.5 F (36.4 C) 97.7 F (36.5 C)  TempSrc: Oral     SpO2: 99% 100% 97% 97%  Weight:      Height:        General exam: Elderly female, moderately built and nourished lying comfortably propped up in bed without distress.  Oral mucosa moist Respiratory system: Clear to auscultation. Respiratory effort normal. Cardiovascular system: S1 & S2 heard, RRR. No JVD, murmurs, rubs, gallops or clicks. No pedal edema. Gastrointestinal system: Abdomen is nondistended, soft and nontender. No organomegaly or masses felt. Normal bowel sounds heard. Central nervous system: Alert and oriented. No focal neurological deficits.  Extremely hard of hearing making communication with her difficult despite her hearing aids. Extremities: Symmetric 5 x 5 power. Skin: No rashes, lesions or ulcers Psychiatry: Judgement and insight appear normal. Mood & affect appropriate.     Data Reviewed:   I have personally reviewed following labs and imaging studies   CBC: Recent Labs  Lab 05/14/21 1450 05/16/21 0629 05/18/21 2115 05/20/21 0433  WBC 10.0 7.9 8.7 8.2  NEUTROABS 8.0*  --   --   --   HGB 8.3* 7.5* 8.0* 7.9*  HCT 29.5* 26.7* 29.1* 28.0*  MCV 89.7 90.2 91.5 90.6  PLT 322 261 251 233    Basic Metabolic Panel: Recent Labs  Lab 05/14/21 1450 05/16/21 0629 05/18/21 2115 05/20/21 0433  NA 139 144 140 142  K 3.3* 4.1 4.3 3.7  CL 102 106 99 97*  CO2 29 31 32 35*  GLUCOSE 152* 88 121* 136*  BUN 25* CREATININE 1.02* 1.00 0.94 1.12*  CALCIUM 8.5* 8.3* 8.6* 8.2*  MG  --  2.2  --   --     Liver Function Tests: Recent Labs  Lab 05/14/21 1450 05/18/21 2115   AST 12* 15  ALT 8 8  ALKPHOS 70 63  BILITOT 0.3 0.3  PROT 6.4* 6.1*  ALBUMIN 3.0* 2.7*    CBG: Recent Labs  Lab 05/20/21 0727 05/20/21 1152 05/20/21 1631  GLUCAP 119* 159* 112*    Microbiology Studies:   Recent Results (from the past 240 hour(s))  Resp Panel by RT-PCR (Flu A&B, Covid) Nasopharyngeal Swab     Status: None   Collection Time: 05/14/21  9:17 PM   Specimen: Nasopharyngeal Swab; Nasopharyngeal(NP) swabs in vial transport medium  Result Value Ref Range Status   SARS Coronavirus 2 by RT PCR NEGATIVE NEGATIVE Final    Comment: (NOTE) SARS-CoV-2 target nucleic acids are NOT DETECTED.  The SARS-CoV-2 RNA is generally detectable in upper respiratory specimens during the acute phase of infection. The lowest concentration of SARS-CoV-2 viral copies this assay can detect is 138 copies/mL. A negative result does not preclude SARS-Cov-2 infection and should not be used as the sole basis for treatment or other patient management decisions. A negative result may occur with  improper specimen collection/handling, submission of specimen other than nasopharyngeal swab, presence of viral mutation(s) within the areas targeted by this assay, and inadequate number of viral copies(<138 copies/mL). A negative  result must be combined with clinical observations, patient history, and epidemiological information. The expected result is Negative.  Fact Sheet for Patients:  BloggerCourse.com  Fact Sheet for Healthcare Providers:  SeriousBroker.it  This test is no t yet approved or cleared by the [place] FDA and  has been authorized for detection and/or diagnosis of SARS-CoV-2 by FDA under an Emergency Use Authorization (EUA). This EUA will remain  in effect (meaning this test can be used) for the duration of the COVID-19 declaration under Section 564(b)(1) of the Act, 21 U.S.C.section 360bbb-3(b)(1), unless the authorization  is terminated  or revoked sooner.       Influenza A by PCR NEGATIVE NEGATIVE Final   Influenza B by PCR NEGATIVE NEGATIVE Final    Comment: (NOTE) The Xpert Xpress SARS-CoV-2/FLU/RSV plus assay is intended as an aid in the diagnosis of influenza from Nasopharyngeal swab specimens and should not be used as a sole basis for treatment. Nasal washings and aspirates are unacceptable for Xpert Xpress SARS-CoV-2/FLU/RSV testing.  Fact Sheet for Patients: BloggerCourse.com  Fact Sheet for Healthcare Providers: SeriousBroker.it  This test is not yet approved or cleared by the [place] FDA and has been authorized for detection and/or diagnosis of SARS-CoV-2 by FDA under an Emergency Use Authorization (EUA). This EUA will remain in effect (meaning this test can be used) for the duration of the COVID-19 declaration under Section 564(b)(1) of the Act, 21 U.S.C. section 360bbb-3(b)(1), unless the authorization is terminated or revoked.  Performed at [facility], [address] [place], [place] [address]   Resp Panel by RT-PCR (Flu A&B, Covid) Nasopharyngeal Swab     Status: None   Collection Time: 05/19/21 12:02 AM   Specimen: Nasopharyngeal Swab; Nasopharyngeal(NP) swabs in vial transport medium  Result Value Ref Range Status   SARS Coronavirus 2 by RT PCR NEGATIVE NEGATIVE Final    Comment: (NOTE) SARS-CoV-2 target nucleic acids are NOT DETECTED.  The SARS-CoV-2 RNA is generally detectable in upper respiratory specimens during the acute phase of infection. The lowest concentration of SARS-CoV-2 viral copies this assay can detect is 138 copies/mL. A negative result does not preclude SARS-Cov-2 infection and should not be used as the sole basis for treatment or other patient management decisions. A negative result may occur with  improper specimen collection/handling, submission of specimen other than nasopharyngeal  swab, presence of viral mutation(s) within the areas targeted by this assay, and inadequate number of viral copies(<138 copies/mL). A negative result must be combined with clinical observations, patient history, and epidemiological information. The expected result is Negative.  Fact Sheet for Patients:  BloggerCourse.com  Fact Sheet for Healthcare Providers:  SeriousBroker.it  This test is no t yet approved or cleared by the [place] FDA and  has been authorized for detection and/or diagnosis of SARS-CoV-2 by FDA under an Emergency Use Authorization (EUA). This EUA will remain  in effect (meaning this test can be used) for the duration of the COVID-19 declaration under Section 564(b)(1) of the Act, 21 U.S.C.section 360bbb-3(b)(1), unless the authorization is terminated  or revoked sooner.       Influenza A by PCR NEGATIVE NEGATIVE Final   Influenza B by PCR NEGATIVE NEGATIVE Final    Comment: (NOTE) The Xpert Xpress SARS-CoV-2/FLU/RSV plus assay is intended as an aid in the diagnosis of influenza from Nasopharyngeal swab specimens and should not be used as a sole basis for treatment. Nasal washings and aspirates are unacceptable for Xpert Xpress SARS-CoV-2/FLU/RSV testing.  Fact  Sheet for Patients: BloggerCourse.com  Fact Sheet for Healthcare Providers: SeriousBroker.it  This test is not yet approved or cleared by the [place] FDA and has been authorized for detection and/or diagnosis of SARS-CoV-2 by FDA under an Emergency Use Authorization (EUA). This EUA will remain in effect (meaning this test can be used) for the duration of the COVID-19 declaration under Section 564(b)(1) of the Act, 21 U.S.C. section 360bbb-3(b)(1), unless the authorization is terminated or revoked.  Performed at [facility], [address], [place], [place] [address]      Radiology Studies:  CT Angio Chest PE W and/or Wo Contrast  Result Date: 05/19/2021 CLINICAL DATA:  Pulmonary embolism suspected. Generalized weakness with decreased appetite. EXAM: CT ANGIOGRAPHY CHEST WITH CONTRAST TECHNIQUE: Multidetector CT imaging of the chest was performed using the standard protocol during bolus administration of intravenous contrast. Multiplanar CT image reconstructions and MIPs were obtained to evaluate the vascular anatomy. RADIATION DOSE REDUCTION: This exam was performed according to the departmental dose-optimization program which includes automated exposure control, adjustment of the mA and/or kV according to patient size and/or use of iterative reconstruction technique. CONTRAST:  51mL OMNIPAQUE IOHEXOL 350 MG/ML SOLN COMPARISON:  Chest PA and lateral from earlier today, PA and lateral chest 05/15/2021, chest CTA 11/06/2019 FINDINGS: Cardiovascular: There is mild panchamber cardiomegaly with increased septal and left ventricular wall hypertrophy. The pulmonary veins are decompressed. There is moderate to heavy three-vessel calcific CAD. There is minimal superior recess pericardial effusion. Pulmonary arteries are normal in caliber without appreciable embolic filling defects. There is chronic lipomatous hypertrophy of the inter-atrial septum. No findings of acute right heart strain. Moderate aortic atherosclerosis is seen with aortic tortuosity and normal great vessel branching and opacification. There is no aneurysm or dissection. Mediastinum/Nodes: Moderate-sized hiatal hernia. Thyroid gland mildly prominent with dystrophic calcifications in the left lobe and several small nodules, but not well seen due to artifact from the patient's superimposed right arm and contrast in the left subclavian vein. There are slightly prominent right paratracheal lymph nodes, stable. There is no further intrathoracic adenopathy, no hilar or axillary adenopathy, no tracheal filling defects or  focal esophageal mass. Lungs/Pleura: Central bronchial thickening again is noted in both upper and lower lobes. There is mucous plugging in several bilateral posterior basal lower lobe small bronchi. Peripheral hazy opacities noted in the right upper lobe base and right middle lobe laterally most likely representing mild pneumonitis. Other widespread tree-in-bud interstitial changes noted on the prior study are not seen today. No subpleural interstitial septal edema is seen. Reticular scarring at the apices is again shown. There are small bilateral layering pleural effusions symmetrically, with adjacent compressive atelectasis in the lower lobes. Upper Abdomen: No acute abnormality. 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 There are subtle capsular nodular changes in the left lobe of the liver consistent with early cirrhosis but there is no splenomegaly, ascites or portal vein dilatation. Musculoskeletal: Osteopenia and degenerative change thoracic spine. There are healed fracture deformities of some of the anterolateral right ribs. There is a T12 upper plate anterior wedge compression fracture with 30% anterior and 15% posterior vertebral height loss with 4 mm posterosuperior cortical retropulsion and no paraspinal hematoma. This is new from 11/06/2019 but age indeterminate. Other vertebrae are normal in heights. The pedicles and posterior elements are intact. Review of the MIP images confirms the above findings. IMPRESSION: 1. No evidence of arterial dilatation or emboli. 2. Aortic atherosclerosis without  aneurysm or dissection. Coronary artery atherosclerosis. 3. Cardiomegaly without venous distention or subpleural edema. Small pleural effusions. 4. Bronchitis in the upper and lower lobes with bilateral lower lobe mucous plugging and peripheral ground-glass infiltrates in the right upper and middle lobes most likely reflecting multilobar pneumonitis. Follow-up  as indicated. 5. Suspected early cirrhosis left lobe of the liver. No splenomegaly or portal dilatation. 6. Moderate-sized hiatal hernia. 7. Osteopenia with superior endplate anterior wedge compression fracture of T12 with retropulsion, new from 11/06/2019 but age indeterminate. No adjacent hematoma. 8. Additional chronic findings. Electronically Signed   By: Almira BarKeith  Chesser M.D.   On: 05/19/2021 01:27   DG Chest Portable 1 View  Result Date: 05/19/2021 CLINICAL DATA:  Generalized weakness. EXAM: PORTABLE CHEST 1 VIEW COMPARISON:  May 15, 2021 FINDINGS: The lungs are hyperinflated. Predominant stable, diffusely increased interstitial lung markings are noted. Mild atelectasis is seen within the bilateral lung bases. There is no evidence of a pleural effusion or pneumothorax. The cardiac silhouette is moderately enlarged and unchanged in size. Chronic right-sided rib fractures are seen. IMPRESSION: 1. Stable cardiomegaly with mild interstitial edema. 2. Mild, stable bibasilar atelectasis. Electronically Signed   By: Aram Candelahaddeus  Houston M.D.   On: 05/19/2021 00:34   ECHOCARDIOGRAM LIMITED  Result Date: 05/19/2021    ECHOCARDIOGRAM LIMITED REPORT   Patient Name:   Judy Harvey Date of Exam: 05/19/2021 Medical Rec #:  [id number]         Height:       62.0 in Accession #:    [id number]        Weight:       161.0 lb Date of Birth:  01-11-1928         BSA:          1.743 m Patient Age:    89 years          BP:           116/53 mmHg Patient Gender: F                 HR:           72 bpm. Exam Location:  [facility] Procedure: Limited Echo, Limited Color Doppler and Cardiac Doppler Indications:     I21.4 NSTEMI  History:         Patient has prior history of Echocardiogram examinations, most                  recent 05/15/2021. Stroke; Risk Factors:Hypertension, Diabetes                  and Dyslipidemia.  Sonographer:     Humphrey RollsJoan Heiss Referring Phys:  [id number] Andris BaumannHAZEL V DUNCAN Diagnosing Phys: Sena Slateyan Orgel  Sonographer Comments: No  parasternal window and suboptimal subcostal window. IMPRESSIONS  1. Left ventricular ejection fraction, by estimation, is 55 to 60%. The left ventricle has normal function. The left ventricle has no regional wall motion abnormalities.  2. Right ventricular systolic function is normal. The right ventricular size is normal.  3. The inferior vena cava is normal in size with greater than 50% respiratory variability, suggesting right atrial pressure of 3 mmHg. Conclusion(s)/Recommendation(s): INSUFFICIENT EXAM TO EVALUATE FOR VALVULAR PATHOLOGY. FINDINGS  Left Ventricle: Poor echocardiographic windows. Left ventricular ejection fraction, by estimation, is 55 to 60%. The left ventricle has normal function. The left ventricle has no regional wall motion abnormalities. Right Ventricle: The right ventricular size is normal. Right ventricular systolic function is normal. Pericardium: There is  no evidence of pericardial effusion. Venous: The inferior vena cava is normal in size with greater than 50% respiratory variability, suggesting right atrial pressure of 3 mmHg.  LV Volumes (MOD) LV vol d, MOD A2C: 77.9 ml LV vol d, MOD A4C: 68.7 ml LV vol s, MOD A2C: 31.4 ml LV vol s, MOD A4C: 16.5 ml LV SV MOD A2C:     46.5 ml LV SV MOD A4C:     68.7 ml LV SV MOD BP:      51.5 ml Sena Slate Electronically signed by Sena Slate Signature Date/Time: 05/19/2021/5:07:09 PM    Final     Scheduled Meds:    aspirin EC  81 mg Oral Daily   atorvastatin  80 mg Oral Daily   bisacodyl  10 mg Rectal Once   clopidogrel  75 mg Oral Daily   enoxaparin (LOVENOX) injection  40 mg Subcutaneous QHS   metoprolol tartrate  12.5 mg Oral BID   pantoprazole (PROTONIX) IV  40 mg Intravenous Q24H   polyethylene glycol  17 g Oral Daily    Continuous Infusions:    ampicillin-sulbactam (UNASYN) IV       LOS: 1 day     Marcellus Scott, MD,  FACP, [facility], [facility], [facility] (Care Management Physician Certified) [facility]  To contact the attending provider between 7A-7P or the covering provider during after hours 7P-7A, please log into the web site [email or web address] and access using universal [place] password for that web site. If you do not have the password, please call the hospital operator.  05/20/2021, 6:15 PM

## 2021-05-20 NOTE — Progress Notes (Signed)
PHARMACY NOTE:  ANTIMICROBIAL RENAL DOSAGE ADJUSTMENT ? ?Current antimicrobial regimen includes a mismatch between antimicrobial dosage and estimated renal function.  As per policy approved by the Pharmacy & Therapeutics and Medical Executive Committees, the antimicrobial dosage will be adjusted accordingly. ? ?Current antimicrobial dosage:  Unasyn 3gm q 6hrs ? ?Indication: Aspiration pneumonia ? ?Renal Function: ? ?Estimated Creatinine Clearance: 30 mL/min (A) (by C-G formula based on SCr of 1.12 mg/dL (H)). ?[]      On intermittent HD, scheduled: ?[]      On CRRT ?   ?Antimicrobial dosage has been changed to:  Unasyn 3gm q 12 hours ? ?Additional comments: ? ? ?Thank you for allowing pharmacy to be a part of this patient's care. ? ?Pollyanna Levay Rodriguez-Guzman PharmD, BCPS ?05/20/2021 4:31 PM ? ? ? ?  ? ?

## 2021-05-20 NOTE — TOC CM/SW Note (Signed)
CSW acknowledges SNF consult. Please enter PT/OT orders when appropriate. Per chart review, patient was at [facility] 1/6-1/22 (about 16 days). Patient has about 4 days left that insurance would cover at 100% before 20% daily copay would begin. This will not start over until patient has been out of the hospital/facility for a 60 day wellness period. ? ?Judy Harvey, [place] ?[phone] ? ?

## 2021-05-20 NOTE — Assessment & Plan Note (Addendum)
Patient reported to RN that she had stopped eating PTA for a couple days because she wanted to die/"go up to the [place]". ?Denies specific suicidal ideations and nursing not concerned about this either. ?Resumed prior home dose of Cymbalta 30 mg daily, trazodone 50 Mg at bedtime and melatonin 3 mg at bedtime for sleep.  Patient indicates that she has been on these medications for some time and yesterday she specifically asked why she was not on them and to be resumed. ?Psychiatry was consulted and their input much appreciated.  They indicate that she has no active symptoms of depression and did not recommend any treatment for depression.  However since she has been on Cymbalta and trazodone for some time now, continued these at discharge and this can be further addressed by PCP during outpatient follow-up. ?

## 2021-05-21 ENCOUNTER — Inpatient Hospital Stay: Payer: Medicare Other

## 2021-05-21 DIAGNOSIS — F32A Depression, unspecified: Secondary | ICD-10-CM

## 2021-05-21 LAB — GLUCOSE, CAPILLARY
Glucose-Capillary: 116 mg/dL — ABNORMAL HIGH (ref 70–99)
Glucose-Capillary: 119 mg/dL — ABNORMAL HIGH (ref 70–99)
Glucose-Capillary: 129 mg/dL — ABNORMAL HIGH (ref 70–99)
Glucose-Capillary: 154 mg/dL — ABNORMAL HIGH (ref 70–99)
Glucose-Capillary: 175 mg/dL — ABNORMAL HIGH (ref 70–99)
Glucose-Capillary: 214 mg/dL — ABNORMAL HIGH (ref 70–99)

## 2021-05-21 MED ORDER — GABAPENTIN 300 MG PO CAPS
300.0000 mg | ORAL_CAPSULE | Freq: Every day | ORAL | Status: DC
  Administered 2021-05-21 (×2): 300 mg via ORAL
  Filled 2021-05-21 (×2): qty 1

## 2021-05-21 MED ORDER — TRAZODONE HCL 50 MG PO TABS
50.0000 mg | ORAL_TABLET | Freq: Every day | ORAL | Status: DC
  Administered 2021-05-21: 50 mg via ORAL
  Filled 2021-05-21: qty 1

## 2021-05-21 MED ORDER — VITAMIN D 25 MCG (1000 UNIT) PO TABS
5000.0000 [IU] | ORAL_TABLET | Freq: Every day | ORAL | Status: DC
  Administered 2021-05-21: 5000 [IU] via ORAL
  Filled 2021-05-21 (×2): qty 5

## 2021-05-21 MED ORDER — PANTOPRAZOLE SODIUM 40 MG PO TBEC
40.0000 mg | DELAYED_RELEASE_TABLET | Freq: Every day | ORAL | Status: DC
Start: 2021-05-22 — End: 2021-05-22
  Administered 2021-05-22: 40 mg via ORAL
  Filled 2021-05-21: qty 1

## 2021-05-21 MED ORDER — DULOXETINE HCL 30 MG PO CPEP
30.0000 mg | ORAL_CAPSULE | Freq: Every day | ORAL | Status: DC
  Administered 2021-05-21 – 2021-05-22 (×2): 30 mg via ORAL
  Filled 2021-05-21 (×2): qty 1

## 2021-05-21 MED ORDER — MELATONIN 5 MG PO TABS
2.5000 mg | ORAL_TABLET | Freq: Every day | ORAL | Status: DC
  Administered 2021-05-21: 2.5 mg via ORAL
  Filled 2021-05-21: qty 1

## 2021-05-21 MED ORDER — FERROUS SULFATE 325 (65 FE) MG PO TABS
325.0000 mg | ORAL_TABLET | Freq: Every day | ORAL | Status: DC
  Administered 2021-05-21 – 2021-05-22 (×2): 325 mg via ORAL
  Filled 2021-05-21 (×2): qty 1

## 2021-05-21 NOTE — Consult Note (Signed)
[facility] Face-to-Face Psychiatry Consult   Reason for Consult: Consult for 88 year old woman in the hospital with multiple medical problems.  Concern raised at 1 point about her saying that she might have wanted to die. Referring Physician:  Hongalgi Patient Identification: Judy Harvey MRN:  [id number] Principal Diagnosis: Generalized weakness Diagnosis:  Principal Problem:   Generalized weakness Active Problems:   Type 2 diabetes mellitus without complication, with long-term current use of insulin (HCC)   Recent cerebrovascular accident 05/14/21 (CVA)   History of esophageal stricture   Depression   Iron deficiency anemia   Pneumonitis   Elevated troponin   Chronic respiratory failure with hypoxia (HCC)   COPD (chronic obstructive pulmonary disease) (HCC)   Total Time spent with patient: 1 hour  Subjective:   Judy Harvey is a 88 y.o. female patient admitted with "it was not that I wanted to, I just could not eat".  HPI: Patient seen and chart reviewed.  88 year old woman came into the hospital with multiple medical problems including having been off her food for a while.  Patient reports that food had been tasting bad to her recently and that she just felt like she could not eat and had no appetite.  She denies that she was trying to starve herself.  She tells me that at one point the thought occurred to her that if her body was just not eating perhaps it was God's will that she passed away and that she was pleased that she would get to see God.  She denies that she was ever intentionally trying to starve herself or do anything else to harm herself.  Patient states that her mood today is feeling good and indeed she is quite upbeat and chipper.  Very happy to talk.  Denies feeling depressed.  Appears optimistic about the future with multiple positive things in her life that she is enjoying including her family and the idea of being able to go back home.  Past Psychiatric History: No  past history of any psychiatric illness or treatment  Risk to Self:   Risk to Others:   Prior Inpatient Therapy:   Prior Outpatient Therapy:    Past Medical History:  Past Medical History:  Diagnosis Date   Anemia    Depression    Diabetes mellitus without complication (HCC)    GERD (gastroesophageal reflux disease)    History of COVID-19    Hyperlipidemia    Hypertension    Legally blind    Macular degeneration, bilateral    Peripheral neuropathy    Post herpetic neuralgia    located in her back   Stroke [facility])     Past Surgical History:  Procedure Laterality Date   ABDOMINAL HYSTERECTOMY     partial   APPENDECTOMY     BREAST SURGERY     reduction   CAROTID PTA/STENT INTERVENTION Left 10/18/2017   Procedure: CAROTID PTA/STENT INTERVENTION;  Surgeon: Annice Needy, MD;  Location: [facility] INVASIVE CV LAB;  Service: Cardiovascular;  Laterality: Left;   CATARACT EXTRACTION, BILATERAL     CESAREAN SECTION     x3   COLONOSCOPY     ESOPHAGEAL DILATION     Family History:  Family History  Problem Relation Age of Onset   Healthy Mother    Diabetes Father    Alzheimer's disease Father    Multiple sclerosis Sister    Multiple sclerosis Brother    Family Psychiatric  History: None reported Social History:  Social History  Substance and Sexual Activity  Alcohol Use No     Social History   Substance and Sexual Activity  Drug Use No    Social History   Socioeconomic History   Marital status: Widowed    Spouse name: Not on file   Number of children: 3   Years of education: some college   Highest education level: Not on file  Occupational History   Occupation: Disabled  Tobacco Use   Smoking status: Former    Packs/day: 3.00    Years: 50.00    Pack years: 150.00    Types: Cigarettes    Quit date: 2008    Years since quitting: 15.1   Smokeless tobacco: Never   Tobacco comments:    smoking cessation materials not required  Vaping Use   Vaping Use: Never used   Substance and Sexual Activity   Alcohol use: No   Drug use: No   Sexual activity: Not Currently  Other Topics Concern   Not on file  Social History Narrative   Pt lives with son Loraine Leriche   Social Determinants of Health   Financial Resource Strain: Not on file  Food Insecurity: Not on file  Transportation Needs: Not on file  Physical Activity: Not on file  Stress: Not on file  Social Connections: Not on file   Additional Social History:    Allergies:   Allergies  Allergen Reactions   Adhesive [Tape] Other (See Comments)    "pulls my skin off" paper tape ok   Lyrica [Pregabalin]     Body spasms    Labs:  Results for orders placed or performed during the hospital encounter of 05/18/21 (from the past 48 hour(s))  Glucose, capillary     Status: Abnormal   Collection Time: 05/19/21  7:40 PM  Result Value Ref Range   Glucose-Capillary 294 (H) 70 - 99 mg/dL    Comment: Glucose reference range applies only to samples taken after fasting for at least 8 hours.  Glucose, capillary     Status: None   Collection Time: 05/19/21 11:57 PM  Result Value Ref Range   Glucose-Capillary 93 70 - 99 mg/dL    Comment: Glucose reference range applies only to samples taken after fasting for at least 8 hours.  Glucose, capillary     Status: Abnormal   Collection Time: 05/20/21  3:55 AM  Result Value Ref Range   Glucose-Capillary 134 (H) 70 - 99 mg/dL    Comment: Glucose reference range applies only to samples taken after fasting for at least 8 hours.  CBC     Status: Abnormal   Collection Time: 05/20/21  4:33 AM  Result Value Ref Range   WBC 8.2 4.0 - 10.5 K/uL   RBC 3.09 (L) 3.87 - 5.11 MIL/uL   Hemoglobin 7.9 (L) 12.0 - 15.0 g/dL   HCT 47.8 (L) 29.5 - 62.1 %   MCV 90.6 80.0 - 100.0 fL   MCH 25.6 (L) 26.0 - 34.0 pg   MCHC 28.2 (L) 30.0 - 36.0 g/dL   RDW 30.8 (H) 65.7 - 84.6 %   Platelets 233 150 - 400 K/uL   nRBC 0.0 0.0 - 0.2 %    Comment: Performed at [facility], [address], [place], [place] [address]  Basic metabolic panel     Status: Abnormal   Collection Time: 05/20/21  4:33 AM  Result Value Ref Range   Sodium 142 135 - 145 mmol/L   Potassium 3.7 3.5 -  5.1 mmol/L   Chloride 97 (L) 98 - 111 mmol/L   CO2 35 (H) 22 - 32 mmol/L   Glucose, Bld 136 (H) 70 - 99 mg/dL    Comment: Glucose reference range applies only to samples taken after fasting for at least 8 hours.   BUN 14 8 - 23 mg/dL   Creatinine, Ser 4.01 (H) 0.44 - 1.00 mg/dL   Calcium 8.2 (L) 8.9 - 10.3 mg/dL   GFR, Estimated 46 (L) >60 mL/min    Comment: (NOTE) Calculated using the CKD-EPI Creatinine Equation (2021)    Anion gap 10 5 - 15    Comment: Performed at [facility], [address] [place], [place] [address]  Glucose, capillary     Status: Abnormal   Collection Time: 05/20/21  7:27 AM  Result Value Ref Range   Glucose-Capillary 119 (H) 70 - 99 mg/dL    Comment: Glucose reference range applies only to samples taken after fasting for at least 8 hours.  Glucose, capillary     Status: Abnormal   Collection Time: 05/20/21 11:52 AM  Result Value Ref Range   Glucose-Capillary 159 (H) 70 - 99 mg/dL    Comment: Glucose reference range applies only to samples taken after fasting for at least 8 hours.  Glucose, capillary     Status: Abnormal   Collection Time: 05/20/21  4:31 PM  Result Value Ref Range   Glucose-Capillary 112 (H) 70 - 99 mg/dL    Comment: Glucose reference range applies only to samples taken after fasting for at least 8 hours.  Glucose, capillary     Status: Abnormal   Collection Time: 05/20/21  8:33 PM  Result Value Ref Range   Glucose-Capillary 157 (H) 70 - 99 mg/dL    Comment: Glucose reference range applies only to samples taken after fasting for at least 8 hours.  Glucose, capillary     Status: Abnormal   Collection Time: 05/21/21 12:06 AM  Result Value Ref Range   Glucose-Capillary 129 (H) 70 - 99 mg/dL    Comment: Glucose reference range applies  only to samples taken after fasting for at least 8 hours.  Glucose, capillary     Status: Abnormal   Collection Time: 05/21/21  4:22 AM  Result Value Ref Range   Glucose-Capillary 116 (H) 70 - 99 mg/dL    Comment: Glucose reference range applies only to samples taken after fasting for at least 8 hours.  Glucose, capillary     Status: Abnormal   Collection Time: 05/21/21  7:41 AM  Result Value Ref Range   Glucose-Capillary 119 (H) 70 - 99 mg/dL    Comment: Glucose reference range applies only to samples taken after fasting for at least 8 hours.  Glucose, capillary     Status: Abnormal   Collection Time: 05/21/21 11:46 AM  Result Value Ref Range   Glucose-Capillary 154 (H) 70 - 99 mg/dL    Comment: Glucose reference range applies only to samples taken after fasting for at least 8 hours.  Glucose, capillary     Status: Abnormal   Collection Time: 05/21/21  4:37 PM  Result Value Ref Range   Glucose-Capillary 175 (H) 70 - 99 mg/dL    Comment: Glucose reference range applies only to samples taken after fasting for at least 8 hours.    Current Facility-Administered Medications  Medication Dose Route Frequency Provider Last Rate Last Admin   acetaminophen (TYLENOL) tablet 650 mg  650 mg Oral Q6H PRN Lindajo Royal  V, MD   650 mg at 05/21/21 0002   Or   acetaminophen (TYLENOL) suppository 650 mg  650 mg Rectal Q6H PRN Andris Baumannuncan, Hazel V, MD       Ampicillin-Sulbactam (UNASYN) 3 g in sodium chloride 0.9 % 100 mL IVPB  3 g Intravenous Q12H Elease EtienneHongalgi, Anand D, MD   Stopped at 05/21/21 0910   aspirin EC tablet 81 mg  81 mg Oral Daily Creig HinesBerge, Christopher Ronald, NP   81 mg at 05/21/21 [id number]   atorvastatin (LIPITOR) tablet 80 mg  80 mg Oral Daily Creig HinesBerge, Christopher Ronald, NP   80 mg at 05/21/21 [id number]   cholecalciferol (VITAMIN D3) tablet 5,000 Units  5,000 Units Oral Daily Elease EtienneHongalgi, Anand D, MD   5,000 Units at 05/21/21 1718   clopidogrel (PLAVIX) tablet 75 mg  75 mg Oral Daily Creig HinesBerge, Christopher Ronald,  NP   75 mg at 05/21/21 [id number]   DULoxetine (CYMBALTA) DR capsule 30 mg  30 mg Oral Daily Marcellus ScottHongalgi, Anand D, MD   30 mg at 05/21/21 1718   enoxaparin (LOVENOX) injection 40 mg  40 mg Subcutaneous QHS Tressie EllisChappell, Alex B, RPH   40 mg at 05/20/21 2109   ferrous sulfate tablet 325 mg  325 mg Oral Daily Marcellus ScottHongalgi, Anand D, MD   325 mg at 05/21/21 1718   gabapentin (NEURONTIN) capsule 300 mg  300 mg Oral QHS Manuela SchwartzMorrison, Brenda, NP   300 mg at 05/21/21 0138   ipratropium-albuterol (DUONEB) 0.5-2.5 (3) MG/3ML nebulizer solution 3 mL  3 mL Nebulization Q4H PRN Andris Baumannuncan, Hazel V, MD       melatonin tablet 2.5 mg  2.5 mg Oral QHS Hongalgi, Anand D, MD       metoprolol tartrate (LOPRESSOR) tablet 12.5 mg  12.5 mg Oral BID Creig HinesBerge, Christopher Ronald, NP   12.5 mg at 05/21/21 [id number]   ondansetron (ZOFRAN) tablet 4 mg  4 mg Oral Q6H PRN Andris Baumannuncan, Hazel V, MD   4 mg at 05/21/21 0411   Or   ondansetron (ZOFRAN) injection 4 mg  4 mg Intravenous Q6H PRN Andris Baumannuncan, Hazel V, MD       [START ON 05/22/2021] pantoprazole (PROTONIX) EC tablet 40 mg  40 mg Oral Daily Hongalgi, Anand D, MD       polyethylene glycol (MIRALAX / GLYCOLAX) packet 17 g  17 g Oral Daily Elease EtienneHongalgi, Anand D, MD   17 g at 05/20/21 1352   traZODone (DESYREL) tablet 50 mg  50 mg Oral QHS Hongalgi, Maximino GreenlandAnand D, MD        Musculoskeletal: Strength & Muscle Tone: decreased Gait & Station: unsteady Patient leans: N/A            Psychiatric Specialty Exam:  Presentation  General Appearance: No data recorded Eye Contact:No data recorded Speech:No data recorded Speech Volume:No data recorded Handedness:No data recorded  Mood and Affect  Mood:No data recorded Affect:No data recorded  Thought Process  Thought Processes:No data recorded Descriptions of Associations:No data recorded Orientation:No data recorded Thought Content:No data recorded History of Schizophrenia/Schizoaffective disorder:No data recorded Duration of Psychotic Symptoms:No data  recorded Hallucinations:No data recorded Ideas of Reference:No data recorded Suicidal Thoughts:No data recorded Homicidal Thoughts:No data recorded  Sensorium  Memory:No data recorded Judgment:No data recorded Insight:No data recorded  Executive Functions  Concentration:No data recorded Attention Span:No data recorded Recall:No data recorded Fund of Knowledge:No data recorded Language:No data recorded  Psychomotor Activity  Psychomotor Activity:No data recorded  Assets  Assets:No data recorded  Sleep  Sleep:No data recorded  Physical Exam: Physical Exam Vitals and nursing note reviewed.  Constitutional:      Appearance: Normal appearance.  HENT:     Head: Normocephalic and atraumatic.     Mouth/Throat:     Pharynx: Oropharynx is clear.  Eyes:     Pupils: Pupils are equal, round, and reactive to light.  Cardiovascular:     Rate and Rhythm: Normal rate and regular rhythm.  Pulmonary:     Effort: Pulmonary effort is normal.     Breath sounds: Normal breath sounds.  Abdominal:     General: Abdomen is flat.     Palpations: Abdomen is soft.  Musculoskeletal:        General: Normal range of motion.  Skin:    General: Skin is warm and dry.  Neurological:     General: No focal deficit present.     Mental Status: She is alert. Mental status is at baseline.  Psychiatric:        Attention and Perception: Attention normal.        Mood and Affect: Mood normal.        Speech: Speech normal.        Behavior: Behavior is cooperative.        Thought Content: Thought content normal.        Cognition and Memory: Memory is impaired.        Judgment: Judgment normal.   Review of Systems  Constitutional: Negative.   HENT: Negative.    Eyes: Negative.   Respiratory: Negative.    Cardiovascular: Negative.   Gastrointestinal: Negative.   Musculoskeletal: Negative.   Skin: Negative.   Neurological: Negative.   Psychiatric/Behavioral:  Positive for memory loss. Negative  for depression, hallucinations, substance abuse and suicidal ideas. The patient is not nervous/anxious and does not have insomnia.   Blood pressure 130/67, pulse 63, temperature 98.3 F (36.8 C), temperature source Oral, resp. rate 19, height 5\' 2"  (1.575 m), weight 73 kg, SpO2 98 %. Body mass index is 29.45 kg/m.  Treatment Plan Summary: Plan 88 year old woman who has no active symptoms of depression.  Not delirious and not psychotic.  I did not do formal cognitive testing.  She clearly gets a little bit confused with some memory impairment.  About halfway through our conversation she started to think that I was the minister visiting her and I had to gently correct her but otherwise she was on track and appropriate.  Patient is not in any elevated risk of suicide or self-injury.  Does not need any treatment for depression.  I offered her a lot of encouragement and empathy around the difficulty that she is having.  No change recommended treatment plan  Disposition: No evidence of imminent risk to self or others at present.   Patient does not meet criteria for psychiatric inpatient admission. Supportive therapy provided about ongoing stressors.  99, MD 05/21/2021 6:49 PM

## 2021-05-21 NOTE — Care Management Important Message (Signed)
Important Message ? ?Patient Details  ?Name: Judy Harvey ?MRN: [id number] ?Date of Birth: [birth year] ? ? ?Medicare Important Message Given:  Yes ? ? ? ? ?[name] ?05/21/2021, 12:43 PM ?

## 2021-05-21 NOTE — Evaluation (Signed)
Occupational Therapy Evaluation Patient Details Name: Judy Harvey MRN: [id number] DOB: 06-23-1927 Today's Date: 05/21/2021   History of Present Illness 88yo female with PMHx including depression, anxiety, HLD, insulin dependent DM, CAD, restless leg, stroke, macular degeneration, peripheral neuropathy, post herpetic neuralgia, legally blind, HOH, major depressive disorder, TIA, and hospitalized 3/3 - 3/5 for acute CVA presented to [facility] ED on 3/7 with generalized weakness, poor oral intake, and difficulty swallowing. Admitted for weakness, elevated troponins, and pneumonitis.   Clinical Impression   Pt was seen for OT evaluation this date. Prior to hospital admission, pt was living with her son and son's girlfriend in 2 story home (living on main floor). Pt reports since last admission, she has requiring a little more assist with ADL than previous, and has been using a 2WW for mobility. Currently pt demonstrates impairments as described below (See OT problem list) which functionally limit her ability to perform ADL/self-care tasks. Pt able to manage socks while seated EOB using figure 4 technique and supv, CGA-MIN A for ADL transfers, MIN A for LB ADL otherwise, set up and PRN MIN A for seated UB ADL. Pt educated in strategies and set up for meal using high contrast to support access given low vision. Pt eager to return home with family, does not want rehab. Given low vision deficits and functional ability at time of evaluation, OT recommending return home with [facility] services and increased [facility] aide hours to support ADL/IADL and minimize caregiver burden. Care team notified.      Recommendations for follow up therapy are one component of a multi-disciplinary discharge planning process, led by the attending physician.  Recommendations may be updated based on patient status, additional functional criteria and insurance authorization.   Follow Up Recommendations  Home health OT (Also recommend increased  [facility] aide hours)    Assistance Recommended at Discharge Intermittent Supervision/Assistance  Patient can return home with the following A little help with walking and/or transfers;A little help with bathing/dressing/bathroom;Direct supervision/assist for medications management;Assistance with cooking/housework;Assist for transportation    Functional Status Assessment  Patient has had a recent decline in their functional status and demonstrates the ability to make significant improvements in function in a reasonable and predictable amount of time.  Equipment Recommendations  BSC/3in1;Tub/shower seat    Recommendations for Other Services       Precautions / Restrictions Precautions Precautions: Fall Precaution Comments: legally blind, HOH Restrictions Weight Bearing Restrictions: No      Mobility Bed Mobility Overal bed mobility: Needs Assistance Bed Mobility: Supine to Sit     Supine to sit: Min assist     General bed mobility comments: MIN A for trunk elevation    Transfers Overall transfer level: Needs assistance Equipment used: Rolling walker (2 wheels) Transfers: Sit to/from Stand Sit to Stand: Min guard, Min assist           General transfer comment: VC to push from bed      Balance Overall balance assessment: Needs assistance Sitting-balance support: Feet supported, No upper extremity supported Sitting balance-Leahy Scale: Good     Standing balance support: Bilateral upper extremity supported, During functional activity Standing balance-Leahy Scale: Fair                             ADL either performed or assessed with clinical judgement   ADL  General ADL Comments: Pt able to manage socks while seated EOB using figure 4 technique and supv, CGA-MIN A for ADL transfers, MIN A for LB ADL otherwise, set up and PRN MIN A for seated UB ADL. Set up for meal using high contrast to support  access     Vision         Perception     Praxis      Pertinent Vitals/Pain Pain Assessment Pain Assessment: No/denies pain     Hand Dominance Right   Extremity/Trunk Assessment Upper Extremity Assessment Upper Extremity Assessment: Generalized weakness   Lower Extremity Assessment Lower Extremity Assessment: Generalized weakness;LLE deficits/detail LLE Deficits / Details: pt c/o decreased sensation to LLE but functionally not notable impact LLE Sensation: decreased light touch       Communication Communication Communication: HOH   Cognition Arousal/Alertness: Awake/alert Behavior During Therapy: WFL for tasks assessed/performed Overall Cognitive Status: Within Functional Limits for tasks assessed                                 General Comments: no VC for safety needed, PRN VC to support low vision     General Comments       Exercises Other Exercises Other Exercises: Pt educated in low vision strategies for tray set up to help her access items, instruction in ADL transfers with RW   Shoulder Instructions      Home Living Family/patient expects to be discharged to:: Private residence Living Arrangements: Children Available Help at Discharge: Family;Personal care attendant (PCA 1-5pm 4 days/week) Type of Home: House Home Access: Stairs to enter [employer] of Steps: 2 Entrance Stairs-Rails: None Home Layout: Two level;Able to live on main level with bedroom/bathroom     Bathroom Shower/Tub: Walk-in shower         Home Equipment: Conservation officer, nature (2 wheels)   Additional Comments: Pt reports she's ambulatory with RW but does endorse 3 falls in the past 6 months.  Lives With: Family    Prior Functioning/Environment Prior Level of Function : Independent/Modified Independent;Needs assist       Physical Assist : ADLs (physical)   ADLs (physical): Bathing;Dressing;IADLs Mobility Comments: Ambulatory with RW ADLs Comments:  aide help 4x/wk for bathing/dressing and laundry. Family assist with IADLs. Pt reports needing a little more assist since recent admission but was able to use microwave to reheat foot.        OT Problem List: Decreased strength;Decreased activity tolerance;Impaired balance (sitting and/or standing);Decreased knowledge of use of DME or AE;Impaired vision/perception      OT Treatment/Interventions: Self-care/ADL training;Therapeutic exercise;DME and/or AE instruction;Therapeutic activities;Neuromuscular education;Balance training    OT Goals(Current goals can be found in the care plan section) Acute Rehab OT Goals Patient Stated Goal: go back home wiht family OT Goal Formulation: With patient Time For Goal Achievement: 06/04/21 Potential to Achieve Goals: Good ADL Goals Pt Will Perform Lower Body Dressing: with set-up;with supervision;sitting/lateral leans Pt Will Transfer to Toilet: with supervision;ambulating (LRAD, BSC over toilet) Additional ADL Goal #1: Pt will verbalize plan to implement at least 1 learned falls prevention/toileting strategy to minimize falls risk.  OT Frequency: Min 2X/week    Co-evaluation              AM-PAC OT "6 Clicks" Daily Activity     Outcome Measure Help from another person eating meals?: None Help from another person taking care of personal grooming?: A Little Help from  another person toileting, which includes using toliet, bedpan, or urinal?: A Little Help from another person bathing (including washing, rinsing, drying)?: A Little Help from another person to put on and taking off regular upper body clothing?: A Little Help from another person to put on and taking off regular lower body clothing?: A Little 6 Click Score: 19   End of Session Equipment Utilized During Treatment: Rolling walker (2 wheels);Gait belt;Oxygen Nurse Communication: Mobility status;Other (comment) (needs chair alarm)  Activity Tolerance: Patient tolerated treatment  well Patient left: in chair;with call bell/phone within reach;with nursing/sitter in room  OT Visit Diagnosis: Unsteadiness on feet (R26.81);Muscle weakness (generalized) (M62.81)                Time: [id number] OT Time Calculation (min): 36 min Charges:  OT General Charges $OT Visit: 1 Visit OT Evaluation $OT Eval Moderate Complexity: 1 Mod OT Treatments $Self Care/Home Management : 8-22 mins  Ardeth Perfect., MPH, MS, OTR/L ascom [phone] 05/21/21, 10:03 AM

## 2021-05-21 NOTE — Progress Notes (Signed)
PHARMACIST - PHYSICIAN COMMUNICATION ? ?DR:   Waymon Amato ? ?CONCERNING: IV to Oral Route Change Policy ? ?RECOMMENDATION: ?This patient is receiving Pantoprazole by the intravenous route.  Based on criteria approved by the Pharmacy and Therapeutics Committee, the intravenous medication(s) is/are being converted to the equivalent oral dose form(s). ? ? ?DESCRIPTION: ?These criteria include: ?The patient is eating (either orally or via tube) and/or has been taking other orally administered medications for a least 24 hours ?The patient has no evidence of active gastrointestinal bleeding or impaired GI absorption (gastrectomy, short bowel, patient on TNA or NPO). ? ?If you have questions about this conversion, please contact the Pharmacy Department  ?[]   [phone] )  ( [phone] ?[x]   [phone] )  [facility] ?[]   [phone] )  [facility] ?[]   [phone] )  [facility] ?[]   [phone] )  [facility]  ? ?Alphons Burgert Rodriguez-Guzman PharmD, BCPS ?05/21/2021 3:07 PM ? ? ?

## 2021-05-21 NOTE — Progress Notes (Addendum)
PROGRESS NOTE   AMRITA RADU  WEX:[id number]    DOB: 1927-07-07    DOA: 05/18/2021  PCP: Mick Sell, MD   I have briefly reviewed patients previous medical records in [facility].  Chief Complaint  Patient presents with   Weakness    Hospital Course:  88 year old female with medical history significant for DM, anemia, HTN, esophageal stricture, hospitalized 3/3 - 3/5 with acute CVA, chronic respiratory failure on home oxygen chronic respiratory failure on home oxygen 2 L/min, legal blindness secondary to macular degeneration, hard of hearing using hearing aids, iron deficiency anemia, hospitalized 3/3 - 3/5 for acute CVA, presented to the ED for generalized weakness, poor oral intake, difficulty swallowing.  Admitted for generalized weakness, elevated troponin and pneumonitis.  Cardiology consulted, given advanced age, recent CVA and anemia, no invasive work-up planned and medical management advised.  Due to patient expressing thoughts of dying to nursing, suspecting depression, psychiatry consulted and input pending.  Therapies have evaluated and recommend home health services.  DC pending psychiatry input.   Assessment & Plan:  Principal Problem:   Generalized weakness Active Problems:   Pneumonitis   Elevated troponin   Recent cerebrovascular accident 05/14/21 (CVA)   Type 2 diabetes mellitus without complication, with long-term current use of insulin (HCC)   Iron deficiency anemia   History of esophageal stricture   Depression   Chronic respiratory failure with hypoxia (HCC)   COPD (chronic obstructive pulmonary disease) (HCC)   Assessment and Plan: * Generalized weakness Secondary to chronic physical deconditioning, recent stroke, acute illness Home with home health services  Elevated troponin Troponin 500 with no complaints of chest pain and nonacute EKG Possible elevation related to recent CVA a few days prior Cardiology consulted and indicated demand  ischemia versus NSTEMI.  Troponins up to 695. CTA negative for PE. They discussed management options and final decision was made to pursue conservative approach with medical management.  Aggressive intervention was not felt appropriate given her advanced age, recent CVA and anemia.  Continuing aspirin, Plavix, statins and low-dose beta-blockers were added. Cardiology has signed off. TTE shows LVEF 55-60% with no LV wall motion abnormalities.  Pneumonitis Uncertain etiology but some concern for aspiration pneumonitis given history of esophageal stricture and patient's complaints of dysphagia, in the setting of recent stroke Procalcitonin negative. Given concern for aspiration, IV Zosyn was switched over to Unasyn and has completed roughly 3 days course overall.  Plan to switch over to Augmentin in AM. Speech therapy evaluated and recommend regular consistency diet and thin liquids.  Recent cerebrovascular accident 05/14/21 (CVA) Continue aspirin, Plavix and statins. Therapies evaluated and recommend home with home health services  Type 2 diabetes mellitus without complication, with long-term current use of insulin (HCC) Continue basal insulin and SSI. Reasonable inpatient control  Iron deficiency anemia On Venofer Stable.  COPD (chronic obstructive pulmonary disease) (HCC) DuoNebs as needed.  No clinical bronchospasm.  Chronic respiratory failure with hypoxia (HCC) Prior records indicate chronic oxygen use at 2 L/min.  However patient and family today indicated that she was not on oxygen. Wean oxygen off for saturations >92%. Evaluate for home oxygen needs prior to discharge but patient declining oxygen use.  Depression Patient reported to RN that she had stopped eating PTA for a couple days because she wanted to die/go up to the [place]. Denies specific suicidal ideations and nursing not concerned about this either. Resumed prior home dose of Cymbalta 30 mg daily, trazodone 50 Mg at  bedtime and melatonin 3 mg at bedtime for sleep Psychiatry consulted 3/9 and 3/10- awaiting evaluation.  History of esophageal stricture As per speech therapy, regular consistency diet and thin liquids.  May consider outpatient GI evaluation       Body mass index is 29.45 kg/m.    DVT prophylaxis: enoxaparin (LOVENOX) injection 40 mg Start: 05/19/21 2200  We will start Lovenox.   Code Status: Full Code:  Family Communication: None at bedside.  Attempted to reach patient's son via phone but unable and also mailbox is full and unable to leave a voicemail message. Disposition:  Status is: Inpatient Remains inpatient appropriate because: Severity of presentation, IV antibiotics.    Consultants:   Cardiology.  Procedures:     Antimicrobials:    As above.  Subjective:  Reports that her appetite is improved.  Denies dyspnea or chest pain.  Asking for her home peripheral neuropathy and sleep medications to be resumed.  Asking when she can go home.  Objective:   Vitals:   05/21/21 0742 05/21/21 1146 05/21/21 1400 05/21/21 1500  BP: (!) 143/69 123/68  130/67  Pulse: 67 61  63  Resp: 19 18  19   Temp: 98 F (36.7 C) 98.2 F (36.8 C)  98.3 F (36.8 C)  TempSrc: Oral   Oral  SpO2: 99% 98% 98% 98%  Weight:      Height:        General exam: Elderly female, moderately built and nourished sitting up comfortably in reclining chair. Respiratory system: Clear to auscultation. Respiratory effort normal. Cardiovascular system: S1 & S2 heard, RRR. No JVD, murmurs, rubs, gallops or clicks. No pedal edema.  Telemetry personally reviewed: Sinus rhythm. Gastrointestinal system: Abdomen is nondistended, soft and nontender. No organomegaly or masses felt. Normal bowel sounds heard. Central nervous system: Alert and oriented. No focal neurological deficits.  Extremely hard of hearing making communication with her difficult despite her hearing aids. Extremities: Symmetric 5 x 5  power. Skin: No rashes, lesions or ulcers Psychiatry: Judgement and insight appear normal. Mood & affect appropriate.     Data Reviewed:   I have personally reviewed following labs and imaging studies   CBC: Recent Labs  Lab 05/16/21 0629 05/18/21 2115 05/20/21 0433  WBC 7.9 8.7 8.2  HGB 7.5* 8.0* 7.9*  HCT 26.7* 29.1* 28.0*  MCV 90.2 91.5 90.6  PLT 261 251 233    Basic Metabolic Panel: Recent Labs  Lab 05/16/21 0629 05/18/21 2115 05/20/21 0433  NA 144 140 142  K 4.1 4.3 3.7  CL 106 99 97*  CO2 31 32 35*  GLUCOSE 88 121* 136*  BUN 22 15 14   CREATININE 1.00 0.94 1.12*  CALCIUM 8.3* 8.6* 8.2*  MG 2.2  --   --     Liver Function Tests: Recent Labs  Lab 05/18/21 2115  AST 15  ALT 8  ALKPHOS 63  BILITOT 0.3  PROT 6.1*  ALBUMIN 2.7*    CBG: Recent Labs  Lab 05/21/21 0741 05/21/21 1146 05/21/21 1637  GLUCAP 119* 154* 175*    Microbiology Studies:   Recent Results (from the past 240 hour(s))  Resp Panel by RT-PCR (Flu A&B, Covid) Nasopharyngeal Swab     Status: None   Collection Time: 05/14/21  9:17 PM   Specimen: Nasopharyngeal Swab; Nasopharyngeal(NP) swabs in vial transport medium  Result Value Ref Range Status   SARS Coronavirus 2 by RT PCR NEGATIVE NEGATIVE Final    Comment: (NOTE) SARS-CoV-2 target nucleic acids are NOT  DETECTED.  The SARS-CoV-2 RNA is generally detectable in upper respiratory specimens during the acute phase of infection. The lowest concentration of SARS-CoV-2 viral copies this assay can detect is 138 copies/mL. A negative result does not preclude SARS-Cov-2 infection and should not be used as the sole basis for treatment or other patient management decisions. A negative result may occur with  improper specimen collection/handling, submission of specimen other than nasopharyngeal swab, presence of viral mutation(s) within the areas targeted by this assay, and inadequate number of viral copies(<138 copies/mL). A negative  result must be combined with clinical observations, patient history, and epidemiological information. The expected result is Negative.  Fact Sheet for Patients:  BloggerCourse.comhttps://www.fda.gov/media/152166/download  Fact Sheet for Healthcare Providers:  SeriousBroker.ithttps://www.fda.gov/media/152162/download  This test is no t yet approved or cleared by the [place] FDA and  has been authorized for detection and/or diagnosis of SARS-CoV-2 by FDA under an Emergency Use Authorization (EUA). This EUA will remain  in effect (meaning this test can be used) for the duration of the COVID-19 declaration under Section 564(b)(1) of the Act, 21 U.S.C.section 360bbb-3(b)(1), unless the authorization is terminated  or revoked sooner.       Influenza A by PCR NEGATIVE NEGATIVE Final   Influenza B by PCR NEGATIVE NEGATIVE Final    Comment: (NOTE) The Xpert Xpress SARS-CoV-2/FLU/RSV plus assay is intended as an aid in the diagnosis of influenza from Nasopharyngeal swab specimens and should not be used as a sole basis for treatment. Nasal washings and aspirates are unacceptable for Xpert Xpress SARS-CoV-2/FLU/RSV testing.  Fact Sheet for Patients: BloggerCourse.comhttps://www.fda.gov/media/152166/download  Fact Sheet for Healthcare Providers: SeriousBroker.ithttps://www.fda.gov/media/152162/download  This test is not yet approved or cleared by the [place] FDA and has been authorized for detection and/or diagnosis of SARS-CoV-2 by FDA under an Emergency Use Authorization (EUA). This EUA will remain in effect (meaning this test can be used) for the duration of the COVID-19 declaration under Section 564(b)(1) of the Act, 21 U.S.C. section 360bbb-3(b)(1), unless the authorization is terminated or revoked.  Performed at [facility], [address] [place], [place] [address]   Resp Panel by RT-PCR (Flu A&B, Covid) Nasopharyngeal Swab     Status: None   Collection Time: 05/19/21 12:02 AM   Specimen: Nasopharyngeal Swab;  Nasopharyngeal(NP) swabs in vial transport medium  Result Value Ref Range Status   SARS Coronavirus 2 by RT PCR NEGATIVE NEGATIVE Final    Comment: (NOTE) SARS-CoV-2 target nucleic acids are NOT DETECTED.  The SARS-CoV-2 RNA is generally detectable in upper respiratory specimens during the acute phase of infection. The lowest concentration of SARS-CoV-2 viral copies this assay can detect is 138 copies/mL. A negative result does not preclude SARS-Cov-2 infection and should not be used as the sole basis for treatment or other patient management decisions. A negative result may occur with  improper specimen collection/handling, submission of specimen other than nasopharyngeal swab, presence of viral mutation(s) within the areas targeted by this assay, and inadequate number of viral copies(<138 copies/mL). A negative result must be combined with clinical observations, patient history, and epidemiological information. The expected result is Negative.  Fact Sheet for Patients:  BloggerCourse.comhttps://www.fda.gov/media/152166/download  Fact Sheet for Healthcare Providers:  SeriousBroker.ithttps://www.fda.gov/media/152162/download  This test is no t yet approved or cleared by the [place] FDA and  has been authorized for detection and/or diagnosis of SARS-CoV-2 by FDA under an Emergency Use Authorization (EUA). This EUA will remain  in effect (meaning this test can be used) for the duration  of the COVID-19 declaration under Section 564(b)(1) of the Act, 21 U.S.C.section 360bbb-3(b)(1), unless the authorization is terminated  or revoked sooner.       Influenza A by PCR NEGATIVE NEGATIVE Final   Influenza B by PCR NEGATIVE NEGATIVE Final    Comment: (NOTE) The Xpert Xpress SARS-CoV-2/FLU/RSV plus assay is intended as an aid in the diagnosis of influenza from Nasopharyngeal swab specimens and should not be used as a sole basis for treatment. Nasal washings and aspirates are unacceptable for Xpert Xpress  SARS-CoV-2/FLU/RSV testing.  Fact Sheet for Patients: BloggerCourse.com  Fact Sheet for Healthcare Providers: SeriousBroker.it  This test is not yet approved or cleared by the [place] FDA and has been authorized for detection and/or diagnosis of SARS-CoV-2 by FDA under an Emergency Use Authorization (EUA). This EUA will remain in effect (meaning this test can be used) for the duration of the COVID-19 declaration under Section 564(b)(1) of the Act, 21 U.S.C. section 360bbb-3(b)(1), unless the authorization is terminated or revoked.  Performed at [facility], [address] [place], [place] [address]     Radiology Studies:  No results found.  Scheduled Meds:    aspirin EC  81 mg Oral Daily   atorvastatin  80 mg Oral Daily   clopidogrel  75 mg Oral Daily   DULoxetine  30 mg Oral Daily   enoxaparin (LOVENOX) injection  40 mg Subcutaneous QHS   ferrous sulfate  325 mg Oral Daily   gabapentin  300 mg Oral QHS   Melatonin  3 mg Oral QHS   metoprolol tartrate  12.5 mg Oral BID   [START ON 05/22/2021] pantoprazole  40 mg Oral Daily   polyethylene glycol  17 g Oral Daily   traZODone  50 mg Oral QHS   Vitamin D3  1 capsule Oral Daily    Continuous Infusions:    ampicillin-sulbactam (UNASYN) IV Stopped (05/21/21 0910)     LOS: 2 days     Marcellus Scott, MD,  FACP, [facility], [facility], [facility] (Care Management Physician Certified) [facility] [place]  To contact the attending provider between 7A-7P or the covering provider during after hours 7P-7A, please log into the web site [email or web address] and access using universal [place] password for that web site. If you do not have the password, please call the hospital operator.  05/21/2021, 4:42 PM

## 2021-05-21 NOTE — Evaluation (Addendum)
Physical Therapy Evaluation ?Patient Details ?Name: Judy Harvey ?MRN: [id number] ?DOB: [birth year] ?Today's Date: 05/21/2021 ? ?History of Present Illness ? 88yo female with PMHx including depression, anxiety, HLD, insulin dependent DM, CAD, restless leg, stroke, macular degeneration, peripheral neuropathy, post herpetic neuralgia, legally blind, HOH, major depressive disorder, TIA, and hospitalized 3/3 - 3/5 for acute CVA presented to [facility] ED on 3/7 with generalized weakness, poor oral intake, and difficulty swallowing. Admitted for weakness, elevated troponins, and pneumonitis. ?  ?Clinical Impression ? Pt awake, sitting in recliner upon PT entrance into room today for evaluation. Pt is A&Ox4 and denies any c/o pain at rest. She reports she lives in a 2-story house w/ her son and his girlfriend, but stays mainly on the first floor. Pt reports she does have an aide that comes 4x a week for 4hrs/day to help w/ some ADLs (bathing, dressing, etc), but reports she is modI w/ ADLs the rest of the time.  ? ?Pt was able to perform sit to stand w/ CGA using RW. Verbal cues were provided but not necessarily required, to emphasize pushing up from recliner arm-rest to help stand up. She was able to ambulate ~147ft (2 bouts of 88ft, w/ a PT led rest break due to increased fatigue) w/ CGA using RW. Pt left in recliner w/ all needs w/in reach prior to PT exiting room. Pt will benefit from continued skilled PT in order to increase LE strength, improve mobility/gait, and restore PLOF. Current discharge recommendation to HHPT is appropriate due to the level of assistance required by the patient to ensure safety and improve overall function. ?   ?   ? ?Recommendations for follow up therapy are one component of a multi-disciplinary discharge planning process, led by the attending physician.  Recommendations may be updated based on patient status, additional functional criteria and insurance authorization. ? ?Follow Up  Recommendations Home health PT ? ?  ?Assistance Recommended at Discharge Frequent or constant Supervision/Assistance  ?Patient can return home with the following ? A little help with walking and/or transfers;A little help with bathing/dressing/bathroom;Assistance with cooking/housework;Assistance with feeding;Direct supervision/assist for medications management;Help with stairs or ramp for entrance;Direct supervision/assist for financial management;Assist for transportation ? ?  ?Equipment Recommendations Rolling walker (2 wheels)  ?Recommendations for Other Services ?    ?  ?Functional Status Assessment Patient has had a recent decline in their functional status and demonstrates the ability to make significant improvements in function in a reasonable and predictable amount of time.  ? ?  ?Precautions / Restrictions Precautions ?Precautions: Fall ?Precaution Comments: legally blind, HOH ?Restrictions ?Weight Bearing Restrictions: No  ? ?  ? ?Mobility ? Bed Mobility ?  ?  ?  ?  ?  ?  ?  ?General bed mobility comments: n/a received in recliner ?  ? ?Transfers ?Overall transfer level: Needs assistance ?Equipment used: Rolling walker (2 wheels) ?Transfers: Sit to/from Stand ?Sit to Stand: Min guard ?  ?  ?  ?  ?  ?  ?  ? ?Ambulation/Gait ?Ambulation/Gait assistance: Min guard ?Gait Distance (Feet): 160 Feet (2 bouts of 30ft) ?Assistive device: Rolling walker (2 wheels) ?Gait Pattern/deviations: Decreased step length - right, Decreased dorsiflexion - left, Decreased step length - left, Step-through pattern ?Gait velocity: decreased ?  ?  ?  ? ?Stairs ?  ?  ?  ?  ?  ? ?Wheelchair Mobility ?  ? ?Modified Rankin (Stroke Patients Only) ?  ? ?  ? ?Balance Overall balance assessment: Needs assistance ?  Sitting-balance support: Feet supported, No upper extremity supported ?Sitting balance-Leahy Scale: Good ?  ?  ?Standing balance support: Bilateral upper extremity supported, During functional activity ?Standing balance-Leahy  Scale: Fair ?  ?  ?  ?  ?  ?  ?  ?  ?  ?  ?  ?  ?   ? ? ? ?Pertinent Vitals/Pain Pain Assessment ?Pain Assessment: No/denies pain  ? ? ?Home Living Family/patient expects to be discharged to:: Private residence ?Living Arrangements: Children ?Available Help at Discharge: Family;Personal care attendant ?Type of Home: House ?Home Access: Stairs to enter ?Entrance Stairs-Rails: None ?Entrance Stairs-Number of Steps: 2 ?  ?Home Layout: Two level;Able to live on main level with bedroom/bathroom ?Home Equipment: Conservation officer, nature (2 wheels) ?Additional Comments: Per OT "Pt reports she's ambulatory with RW but does endorse 3 falls in the past 6 months."  ?  ?Prior Function Prior Level of Function : Independent/Modified Independent;Needs assist ?  ?  ?  ?Physical Assist : ADLs (physical) ?  ?ADLs (physical): Bathing;Dressing;IADLs ?Mobility Comments: Ambulatory with RW ?ADLs Comments: Per OT "aide help 4x/wk for bathing/dressing and laundry. Family assist with IADLs. Pt reports needing a little more assist since recent admission but was able to use microwave to reheat foot." ?  ? ? ?Hand Dominance  ? Dominant Hand: Right ? ?  ?Extremity/Trunk Assessment  ? Upper Extremity Assessment ?Upper Extremity Assessment: Generalized weakness ?  ? ?Lower Extremity Assessment ?Lower Extremity Assessment: Generalized weakness ?LLE Deficits / Details: pt c/o decreased sensation to LLE but functionally not notable impact ?LLE Sensation: decreased light touch ?  ? ?   ?Communication  ? Communication: HOH  ?Cognition Arousal/Alertness: Awake/alert ?Behavior During Therapy: [facility] for tasks assessed/performed ?Overall Cognitive Status: Within Functional Limits for tasks assessed ?  ?  ?  ?  ?  ?  ?  ?  ?  ?  ?  ?  ?  ?  ?  ?  ?General Comments: no VC for safety needed, PRN VC to support low vision ?  ?  ? ?  ?General Comments   ? ?  ?Exercises    ? ?Assessment/Plan  ?  ?PT Assessment Patient needs continued PT services  ?PT Problem List Decreased  strength;Decreased coordination;Cardiopulmonary status limiting activity;Decreased activity tolerance;Impaired sensation;Decreased balance;Decreased safety awareness;Decreased mobility;Decreased knowledge of use of DME ? ?   ?  ?PT Treatment Interventions DME instruction;Therapeutic exercise;Gait training;Balance training;Stair training;Neuromuscular re-education;Functional mobility training;Therapeutic activities;Patient/family education;Cognitive remediation   ? ?PT Goals (Current goals can be found in the Care Plan section)  ?Acute Rehab PT Goals ?Patient Stated Goal: get better to go home ?PT Goal Formulation: With patient ?Time For Goal Achievement: 06/04/21 ?Potential to Achieve Goals: Good ? ?  ?Frequency Min 2X/week ?  ? ? ?Co-evaluation   ?  ?  ?  ?  ? ? ?  ?AM-PAC PT "6 Clicks" Mobility  ?Outcome Measure Help needed turning from your back to your side while in a flat bed without using bedrails?: None ?Help needed moving from lying on your back to sitting on the side of a flat bed without using bedrails?: A Little ?Help needed moving to and from a bed to a chair (including a wheelchair)?: A Little ?Help needed standing up from a chair using your arms (e.g., wheelchair or bedside chair)?: A Little ?Help needed to walk in hospital room?: A Little ?Help needed climbing 3-5 steps with a railing? : A Lot ?6 Click Score: 18 ? ?  ?End  of Session Equipment Utilized During Treatment: Gait belt;Oxygen ?Activity Tolerance: Patient tolerated treatment well ?Patient left: in chair;with chair alarm set;with call bell/phone within reach ?Nurse Communication: Mobility status ?PT Visit Diagnosis: Unsteadiness on feet (R26.81);Muscle weakness (generalized) (M62.81);Difficulty in walking, not elsewhere classified (R26.2);History of falling (Z91.81);Hemiplegia and hemiparesis ?Hemiplegia - Right/Left: Left ?  ? ?Time: [id number] ?PT Time Calculation (min) (ACUTE ONLY): 36 min ? ? ?Charges:     ?  ?  ?   ? ?Jonnie Kind,  SPT ?05/21/2021, 10:40 AM ? ?

## 2021-05-21 NOTE — Progress Notes (Signed)
Patient ambulated to bathroom and back without O2 on. Checked spO2 on arrival back to bed, found to be 63% and holding. Placed on 2L O2, after several minutes spO2 came up to 92%. Resting on 2L O2 at this time.  ?

## 2021-05-21 NOTE — TOC Initial Note (Addendum)
Transition of Care (TOC) - Initial/Assessment Note  ? ? ?Patient Details  ?Name: Judy Harvey ?MRN: [id number] ?Date of Birth: 1927/10/21 ? ?Transition of Care (TOC) CM/SW Contact:    ?Candie Chroman, LCSW ?Phone Number: ?05/21/2021, 1:12 PM ? ?Clinical Narrative:   CSW met with patient. No supports at bedside. Son had just stepped out per patient. She said she has a walker at home but it is too wide for her to navigate in her home. She recommended asking her son if it is a standard or bariatric walker. She does not have a 3-in-1 but is agreeable to one at discharge. She is not on oxygen at home. Currently on 2 L. She said she was previously on it at home but it was dangerous because she kept tripping on the cords. She has a personal care aide that comes 16 hours per week but she thinks she needs more hours so her son doesn't have to care for her as much. Her grandchildren came into the room so left the room so they could visit. No further concerns. CSW encouraged patient to contact CSW as needed. CSW will continue to follow patient for support and facilitate return home when stable.      ? ?2:01 pm: Ordered RW and 3-in-1 through [employer].      ? ?4:45 pm: Faxed home health orders to [facility] at [place].    ? ?Expected Discharge Plan: [place] ?Barriers to Discharge: Continued Medical Work up ? ? ?Patient Goals and CMS Choice ?  ?  ?Choice offered to / list presented to : NA ? ?Expected Discharge Plan and Services ?Expected Discharge Plan: [place] ?  ?  ?Post Acute Care Choice: Durable Medical Equipment, Resumption of Svcs/PTA Provider ?Living arrangements for the past 2 months: [place] ?                ?  ?  ?  ?  ?  ?HH Arranged: RN, PT, OT, Nurse's Aide ?[place] Agency: Other - See comment ([place]) ?Date HH Agency Contacted: 05/21/21 ?  ?Representative spoke with at [place]: [place] ? ?Prior Living Arrangements/Services ?Living arrangements for the  past 2 months: [place] ?Lives with:: Adult Children ?Patient language and need for interpreter reviewed:: Yes ?Do you feel safe going back to the place where you live?: Yes      ?Need for Family Participation in Patient Care: Yes (Comment) ?Care giver support system in place?: Yes (comment) ?Current home services: DME, Home OT, Home PT, Home RN, Homehealth aide ?Criminal Activity/Legal Involvement Pertinent to Current Situation/Hospitalization: No - Comment as needed ? ?Activities of Daily Living ?Home Assistive Devices/Equipment: Hearing aid, Gilford Rile (specify type), Wheelchair ?ADL Screening (condition at time of admission) ?Patient's cognitive ability adequate to safely complete daily activities?: Yes ?Is the patient deaf or have difficulty hearing?: Yes ?Does the patient have difficulty seeing, even when wearing glasses/contacts?: Yes ?Does the patient have difficulty concentrating, remembering, or making decisions?: Yes ?Patient able to express need for assistance with ADLs?: Yes ?Does the patient have difficulty dressing or bathing?: Yes ?Independently performs ADLs?: No ?Communication: Independent ?Dressing (OT): Needs assistance ?Is this a change from baseline?: Pre-admission baseline ?Grooming: Needs assistance ?Is this a change from baseline?: Pre-admission baseline ?Feeding: Independent with device (comment) ?Bathing: Needs assistance ?Is this a change from baseline?: Pre-admission baseline ?Toileting: Needs assistance ?Is this a change from baseline?: Pre-admission baseline ?In/Out Bed: Needs assistance ?  Is this a change from baseline?: Pre-admission baseline ?Walks in Home: Independent with device (comment) ?Does the patient have difficulty walking or climbing stairs?: Yes ?Weakness of Legs: Both ?Weakness of Arms/Hands: None ? ?Permission Sought/Granted ?Permission sought to share information with : Facility Sport and exercise psychologist, Family Supports ?Permission granted to share information with  : Yes, Verbal Permission Granted ? Share Information with NAME: Billie Trager ? Permission granted to share info w AGENCY: [facility] ? Permission granted to share info w Relationship: Son ? Permission granted to share info w Contact Information: [phone] ? ?Emotional Assessment ?Appearance:: Appears stated age ?Attitude/Demeanor/Rapport: Engaged, Gracious ?Affect (typically observed): Accepting, Appropriate, Calm, Pleasant ?Orientation: : Oriented to Self, Oriented to Place, Oriented to  Time, Oriented to Situation ?Alcohol / Substance Use: Not Applicable ?Psych Involvement: No (comment) ? ?Admission diagnosis:  Weakness [R53.1] ?Elevated troponin [R77.8] ?Generalized weakness [R53.1] ?Patient Active Problem List  ? Diagnosis Date Noted  ? Generalized weakness 05/19/2021  ? Elevated troponin 05/19/2021  ? Chronic respiratory failure with hypoxia ([place]) 05/19/2021  ? COPD (chronic obstructive pulmonary disease) ([place]) 05/19/2021  ? Acute CVA (cerebrovascular accident) ([place]) 05/15/2021  ? Left sided numbness 05/14/2021  ? ARF (acute renal failure) ([place]) 03/12/2021  ? Hypotension due to hypovolemia   ? Acute respiratory failure with hypoxia ([place])   ? Pneumonitis 10/25/2020  ? Major depressive 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 ([place]) 08/21/2018  ? Low vitamin B12 level 04/26/2018  ? Iron deficiency anemia 03/28/2018  ? Frequent PVCs 03/21/2018  ? PSVT (paroxysmal supraventricular tachycardia) ([place]) 03/21/2018  ? Normocytic anemia 02/28/2018  ? Benign essential HTN 02/20/2018  ? SOBOE (shortness of breath on exertion) 02/20/2018  ? Carotid stenosis, symptomatic w/o infarct, left 10/18/2017  ? TIA (transient ischemic attack) 06/16/2017  ? Bilateral carotid artery stenosis 06/16/2017  ? Hyperlipidemia 06/16/2017  ? Obesity (BMI 30.0-34.9) 05/02/2016  ? Vitamin D deficiency 03/25/2016  ? Type 2 diabetes mellitus without complication, with long-term current use of insulin ([place]) 03/24/2016  ? Recent cerebrovascular  accident 05/14/21 ([facility]) 03/24/2016  ? Legally blind 03/24/2016  ? Macular degeneration 03/24/2016  ? Constipation 03/24/2016  ? History of esophageal stricture 03/24/2016  ? Depression 03/24/2016  ? Restless leg syndrome 03/24/2016  ? Postherpetic neuralgia 03/24/2016  ? Gait abnormality 03/24/2016  ? ?PCP:  Leonel Ramsay, MD ?Pharmacy:   ?[id number] [place] [id number]?Phone: [phone]: [phone]? ? ? ? ?Social Determinants of Health (SDOH) Interventions ?  ? ?Readmission Risk Interventions ?No flowsheet data found. ? ? ?

## 2021-05-21 NOTE — TOC CM/SW Note (Addendum)
Patient is active with [facility] for PT, OT, RN. They are able to add aide. ? ?Charlynn Court, CSW ?[phone] ? ?11:58 am: Therapy recommending a RW and 3-in-1. Tried calling son. Unable to leave a voicemail. Will try again later. ? ?Charlynn Court, CSW ?[phone] ? ?

## 2021-05-22 DIAGNOSIS — R778 Other specified abnormalities of plasma proteins: Secondary | ICD-10-CM

## 2021-05-22 DIAGNOSIS — J9611 Chronic respiratory failure with hypoxia: Secondary | ICD-10-CM

## 2021-05-22 DIAGNOSIS — J189 Pneumonia, unspecified organism: Secondary | ICD-10-CM

## 2021-05-22 LAB — GLUCOSE, CAPILLARY
Glucose-Capillary: 108 mg/dL — ABNORMAL HIGH (ref 70–99)
Glucose-Capillary: 120 mg/dL — ABNORMAL HIGH (ref 70–99)
Glucose-Capillary: 137 mg/dL — ABNORMAL HIGH (ref 70–99)
Glucose-Capillary: 146 mg/dL — ABNORMAL HIGH (ref 70–99)

## 2021-05-22 MED ORDER — ATORVASTATIN CALCIUM 80 MG PO TABS: 80.0000 mg | ORAL_TABLET | Freq: Every day | ORAL | 1 refills | Status: AC

## 2021-05-22 MED ORDER — METOPROLOL TARTRATE 25 MG PO TABS
12.5000 mg | ORAL_TABLET | Freq: Two times a day (BID) | ORAL | 0 refills | Status: AC
Start: 2021-05-22 — End: ?

## 2021-05-22 MED ORDER — POLYETHYLENE GLYCOL 3350 17 G PO PACK: 17.0000 g | PACK | Freq: Every day | ORAL | 0 refills | Status: DC

## 2021-05-22 MED ORDER — AMOXICILLIN-POT CLAVULANATE 500-125 MG PO TABS: 1.0000 | ORAL_TABLET | Freq: Two times a day (BID) | ORAL | 0 refills | Status: DC

## 2021-05-22 MED ORDER — AMOXICILLIN-POT CLAVULANATE 500-125 MG PO TABS
1.0000 | ORAL_TABLET | Freq: Two times a day (BID) | ORAL | Status: DC
  Administered 2021-05-22: 500 mg via ORAL
  Filled 2021-05-22 (×2): qty 1

## 2021-05-22 NOTE — Discharge Summary (Signed)
Physician Discharge Summary  Judy Harvey WUJ:[id number] DOB: 1927/07/26  PCP: Mick SellFitzgerald, David P, MD  Admitted from: SNF Discharged to: Home  Admit date: 05/18/2021 Discharge date: 05/22/2021  Recommendations for Outpatient Follow-up:    Follow-up Information     Mick SellFitzgerald, David P, MD. Schedule an appointment as soon as possible for a visit in 1 week(s).   Specialty: Infectious Diseases Why: To be seen with repeat labs (CBC & BMP). Contact information: [address] [place] [id number] [phone]                  Home Health: Home Health Orders (From admission, onward)     Start     Ordered   05/21/21 1350  Home Health  At discharge       Question Answer Comment  To provide the following care/treatments PT   To provide the following care/treatments OT   To provide the following care/treatments RN   To provide the following care/treatments Home Health Aide      05/21/21 1351             Equipment/Devices:     Durable Medical Equipment  (From admission, onward)           Start     Ordered   05/22/21 1208  For home use only DME oxygen  Once       Question Answer Comment  Length of Need 6 Months   Mode or (Route) Nasal cannula   Liters per Minute 2   Frequency Continuous (stationary and portable oxygen unit needed)   Oxygen conserving device Yes   Oxygen delivery system Gas      05/22/21 1207   05/21/21 1352  For home use only DME Walker rolling  Once       Comments: 2 wheels  Question Answer Comment  Walker: With 5 Inch Wheels   Patient needs a walker to treat with the following condition Physical deconditioning      05/21/21 1351   05/21/21 1351  For home use only DME 3 n 1  Once        05/21/21 1351             Discharge Condition: Improved and stable.   Code Status: Full Code Diet recommendation:  Discharge Diet Orders (From admission, onward)     Start     Ordered   05/22/21 0000  Diet - low sodium heart  healthy        05/22/21 1148   05/22/21 0000  Diet Carb Modified        05/22/21 1148             Discharge Diagnoses:  Principal Problem:   Generalized weakness Active Problems:   Pneumonitis   Elevated troponin   Recent cerebrovascular accident 05/14/21 (CVA)   Type 2 diabetes mellitus without complication, with long-term current use of insulin (HCC)   Iron deficiency anemia   History of esophageal stricture   Depression   Chronic respiratory failure with hypoxia (HCC)   COPD (chronic obstructive pulmonary disease) (HCC)   Brief Summary: 88 year old female with medical history significant for DM, anemia, HTN, esophageal stricture, hospitalized 3/3 - 3/5 with acute CVA, chronic respiratory failure on home oxygen chronic respiratory failure on home oxygen 2 L/min, legal blindness secondary to macular degeneration, hard of hearing using hearing aids, iron deficiency anemia, hospitalized 3/3 - 3/5 for acute CVA, presented to the ED for generalized weakness, poor oral intake, difficulty  swallowing.  Admitted for generalized weakness, elevated troponin and pneumonitis.  Cardiology consulted, given advanced age, recent CVA and anemia, no invasive work-up planned and medical management advised.  Due to patient expressing thoughts of dying to nursing, suspecting depression, psychiatry consulted and input pending.  Therapies have evaluated and recommend home health services.  Psychiatry evaluated, no needs identified.  Assessment and Plan: * Generalized weakness Secondary to chronic physical deconditioning, recent stroke, acute illness and advanced age Home with home health services Discussed in detail with patient's son today who expressed agreement.  Elevated troponin Troponin 500 with no complaints of chest pain and nonacute EKG Possible elevation related to recent CVA a few days prior and current pneumonia. Cardiology consulted and indicated demand ischemia versus NSTEMI.  Troponins  up to 695. CTA negative for PE. They discussed management options and final decision was made to pursue conservative approach with medical management.  Aggressive intervention was not felt appropriate given her advanced age, recent CVA and anemia.  Continuing aspirin, Plavix, statins and low-dose beta-blockers were added. Cardiology has signed off. TTE shows LVEF 55-60% with no LV wall motion abnormalities.  Pneumonitis Uncertain etiology but some concern for aspiration pneumonitis given history of esophageal stricture and patient's complaints of dysphagia, in the setting of recent stroke Procalcitonin negative. Given concern for aspiration, IV Zosyn was switched over to Unasyn and completed roughly 3 days course overall.  Switched over to Augmentin at discharge to complete total 5 days treatment. Speech therapy evaluated and recommend regular consistency diet and thin liquids.  Recent cerebrovascular accident 05/14/21 (CVA) Continue aspirin, Plavix and statins. Therapies evaluated and recommend home with home health services  Type 2 diabetes mellitus without complication, with long-term current use of insulin (HCC) A1c 6.8 on 05/15/2021 CBGs quite reasonably controlled in the hospital without use of any insulins. Discontinued Lantus which she was on high-dose PTA but recommend continued close monitoring of CBG 3 times daily before meals and at bedtime.  If CBGs persistently high >180 then may need to resume Lantus. For now continue prior dose of metformin  Iron deficiency anemia On Venofer Stable in the high 7- low 8 g range. Periodically follow CBC as outpatient.  COPD (chronic obstructive pulmonary disease) (HCC) Stable without clinical bronchospasm.  Chronic respiratory failure with hypoxia (HCC) It appears that patient may have been on oxygen 2 L/min at SNF.  Per patient's son, she was at the facility for about 20 days. Son does indicate that patient has home oxygen but she does not  use it regularly. Nursing just reassessed and patient does qualify for home oxygen. Although earlier patient declined home oxygen, currently agreeable if she has to use it. Last night, on ambulating to the bathroom and back without oxygen, her oxygen saturations was down to 63% which improved to 92% on 2 L/min oxygen.  Depression Patient reported to RN that she had stopped eating PTA for a couple days because she wanted to die/"go up to the Lord". Denies specific suicidal ideations and nursing not concerned about this either. Resumed prior home dose of Cymbalta 30 mg daily, trazodone 50 Mg at bedtime and melatonin 3 mg at bedtime for sleep.  Patient indicates that she has been on these medications for some time and yesterday she specifically asked why she was not on them and to be resumed. Psychiatry was consulted and their input much appreciated.  They indicate that she has no active symptoms of depression and did not recommend any treatment for depression.  However since she has been on Cymbalta and trazodone for some time now, continued these at discharge and this can be further addressed by PCP during outpatient follow-up.  History of esophageal stricture As per speech therapy, regular consistency diet and thin liquids.  May consider outpatient GI evaluation    Body mass index is 32.02 kg/m.  Consultations: Psychiatry  Procedures: None   Discharge Instructions  Discharge Instructions     Call MD for:  difficulty breathing, headache or visual disturbances   Complete by: As directed    Call MD for:  extreme fatigue   Complete by: As directed    Call MD for:  persistant dizziness or light-headedness   Complete by: As directed    Call MD for:  persistant nausea and vomiting   Complete by: As directed    Call MD for:  severe uncontrolled pain   Complete by: As directed    Call MD for:  temperature >100.4   Complete by: As directed    Diet - low sodium heart healthy   Complete  by: As directed    Diet Carb Modified   Complete by: As directed    Discharge instructions   Complete by: As directed    Continue to monitor fingerstick blood glucose 4 times daily before each meal 3 times a day and at bedtime.  If her blood sugars consistently greater than 180, follow-up with family physician regarding consideration for reinitiation of insulins.   Increase activity slowly   Complete by: As directed         Medication List     STOP taking these medications    clobetasol ointment 0.05 % Commonly known as: TEMOVATE   insulin glargine 100 UNIT/ML injection Commonly known as: Lantus   Insulin Syringe-Needle U-100 31G X 5/16" 0.3 ML Misc Commonly known as: B-D INS SYR ULTRAFINE .3CC/31G   mirtazapine 7.5 MG tablet Commonly known as: REMERON   nystatin powder Commonly known as: MYCOSTATIN/NYSTOP   promethazine 25 MG tablet Commonly known as: PHENERGAN       TAKE these medications    acetaminophen 325 MG tablet Commonly known as: TYLENOL Take 650 mg by mouth every 6 (six) hours as needed for mild pain or fever.   albuterol 108 (90 Base) MCG/ACT inhaler Commonly known as: VENTOLIN HFA Inhale 2 puffs into the lungs every 6 (six) hours as needed for shortness of breath or wheezing.   albuterol 1.25 MG/3ML nebulizer solution Commonly known as: ACCUNEB Take 3 mLs by nebulization every 6 (six) hours as needed for wheezing or shortness of breath.   amoxicillin-clavulanate 500-125 MG tablet Commonly known as: AUGMENTIN Take 1 tablet (500 mg total) by mouth 2 (two) times daily for 3 doses.   aspirin EC 81 MG tablet Take 81 mg by mouth daily.   atorvastatin 80 MG tablet Commonly known as: LIPITOR Take 1 tablet (80 mg total) by mouth at bedtime. What changed:  how much to take how to take this when to take this additional instructions   clopidogrel 75 MG tablet Commonly known as: PLAVIX Take 1 tablet by mouth daily. TAKE 1 TABLET BY MOUTH EVERY  DAY   DULoxetine 30 MG capsule Commonly known as: CYMBALTA TAKE 1 CAPSULE BY MOUTH EVERY DAY What changed:  how much to take how to take this when to take this additional instructions   ferrous sulfate 325 (65 FE) MG tablet Take 325 mg by mouth daily.   gabapentin 300 MG capsule Commonly known  as: NEURONTIN Take 300 mg by mouth daily. For 90 days   Melatonin 3 MG Caps Take by mouth. Take 3 mg by mouth at bedtime   metFORMIN 500 MG 24 hr tablet Commonly known as: GLUCOPHAGE-XR Take 500 mg by mouth 2 (two) times daily.   metoprolol tartrate 25 MG tablet Commonly known as: LOPRESSOR Take 0.5 tablets (12.5 mg total) by mouth 2 (two) times daily.   ondansetron 8 MG tablet Commonly known as: ZOFRAN Take 8 mg by mouth every 8 (eight) hours as needed for nausea or vomiting.   pantoprazole 40 MG tablet Commonly known as: PROTONIX Take by mouth. Take 1 tablet (40 mg total) by mouth 2 (two) times daily before meals   polyethylene glycol 17 g packet Commonly known as: MIRALAX / GLYCOLAX Take 17 g by mouth daily. Start taking on: May 23, 2021   traZODone 50 MG tablet Commonly known as: DESYREL Take 50 mg by mouth at bedtime.   Vitamin D3 125 MCG (5000 UT) Caps Take 1 capsule (5,000 Units total) by mouth daily.       Allergies  Allergen Reactions   Adhesive [Tape] Other (See Comments)    "pulls my skin off" paper tape ok   Lyrica [Pregabalin]     Body spasms      Procedures/Studies:  CT Angio Chest PE W and/or Wo Contrast  Result Date: 05/19/2021 CLINICAL DATA:  Pulmonary embolism suspected. Generalized weakness with decreased appetite. EXAM: CT ANGIOGRAPHY CHEST WITH CONTRAST TECHNIQUE: Multidetector CT imaging of the chest was performed using the standard protocol during bolus administration of intravenous contrast. Multiplanar CT image reconstructions and MIPs were obtained to evaluate the vascular anatomy. RADIATION DOSE REDUCTION: This exam was performed  according to the departmental dose-optimization program which includes automated exposure control, adjustment of the mA and/or kV according to patient size and/or use of iterative reconstruction technique. CONTRAST:  80mL OMNIPAQUE IOHEXOL 350 MG/ML SOLN COMPARISON:  Chest PA and lateral from earlier today, PA and lateral chest 05/15/2021, chest CTA 11/06/2019 FINDINGS: Cardiovascular: There is mild panchamber cardiomegaly with increased septal and left ventricular wall hypertrophy. The pulmonary veins are decompressed. There is moderate to heavy three-vessel calcific CAD. There is minimal superior recess pericardial effusion. Pulmonary arteries are normal in caliber without appreciable embolic filling defects. There is chronic lipomatous hypertrophy of the inter-atrial septum. No findings of acute right heart strain. Moderate aortic atherosclerosis is seen with aortic tortuosity and normal great vessel branching and opacification. There is no aneurysm or dissection. Mediastinum/Nodes: Moderate-sized hiatal hernia. Thyroid gland mildly prominent with dystrophic calcifications in the left lobe and several small nodules, but not well seen due to artifact from the patient's superimposed right arm and contrast in the left subclavian vein. There are slightly prominent right paratracheal lymph nodes, stable. There is no further intrathoracic adenopathy, no hilar or axillary adenopathy, no tracheal filling defects or focal esophageal mass. Lungs/Pleura: Central bronchial thickening again is noted in both upper and lower lobes. There is mucous plugging in several bilateral posterior basal lower lobe small bronchi. Peripheral hazy opacities noted in the right upper lobe base and right middle lobe laterally most likely representing mild pneumonitis. Other widespread tree-in-bud interstitial changes noted on the prior study are not seen today. No subpleural interstitial septal edema is seen. Reticular scarring at the apices is  again shown. There are small bilateral layering pleural effusions symmetrically, with adjacent compressive atelectasis in the lower lobes. Upper Abdomen: No acute abnormality. 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 There are subtle capsular nodular changes in the left lobe of the liver consistent with early cirrhosis but there is no splenomegaly, ascites or portal vein dilatation. Musculoskeletal: Osteopenia and degenerative change thoracic spine. There are healed fracture deformities of some of the anterolateral right ribs. There is a T12 upper plate anterior wedge compression fracture with 30% anterior and 15% posterior vertebral height loss with 4 mm posterosuperior cortical retropulsion and no paraspinal hematoma. This is new from 11/06/2019 but age indeterminate. Other vertebrae are normal in heights. The pedicles and posterior elements are intact. Review of the MIP images confirms the above findings. IMPRESSION: 1. No evidence of arterial dilatation or emboli. 2. Aortic atherosclerosis without aneurysm or dissection. Coronary artery atherosclerosis. 3. Cardiomegaly without venous distention or subpleural edema. Small pleural effusions. 4. Bronchitis in the upper and lower lobes with bilateral lower lobe mucous plugging and peripheral ground-glass infiltrates in the right upper and middle lobes most likely reflecting multilobar pneumonitis. Follow-up as indicated. 5. Suspected early cirrhosis left lobe of the liver. No splenomegaly or portal dilatation. 6. Moderate-sized hiatal hernia. 7. Osteopenia with superior endplate anterior wedge compression fracture of T12 with retropulsion, new from 11/06/2019 but age indeterminate. No adjacent hematoma. 8. Additional chronic findings. Electronically Signed   By: Almira Bar M.D.   On: 05/19/2021 01:27   DG Chest Portable 1 View  Result Date: 05/19/2021 CLINICAL DATA:  Generalized weakness. EXAM: PORTABLE  CHEST 1 VIEW COMPARISON:  May 15, 2021 FINDINGS: The lungs are hyperinflated. Predominant stable, diffusely increased interstitial lung markings are noted. Mild atelectasis is seen within the bilateral lung bases. There is no evidence of a pleural effusion or pneumothorax. The cardiac silhouette is moderately enlarged and unchanged in size. Chronic right-sided rib fractures are seen. IMPRESSION: 1. Stable cardiomegaly with mild interstitial edema. 2. Mild, stable bibasilar atelectasis. Electronically Signed   By: Aram Candela M.D.   On: 05/19/2021 00:34   ECHOCARDIOGRAM LIMITED  Result Date: 05/19/2021    ECHOCARDIOGRAM LIMITED REPORT   Patient Name:   Judy Harvey Date of Exam: 05/19/2021 Medical Rec #:  [id number]         Height:       62.0 in Accession #:    [id number]        Weight:       161.0 lb Date of Birth:  1927/05/08         BSA:          1.743 m Patient Age:    88 years          BP:           116/53 mmHg Patient Gender: F                 HR:           72 bpm. Exam Location:  [facility] Procedure: Limited Echo, Limited Color Doppler and Cardiac Doppler Indications:     I21.4 NSTEMI  History:         Patient has prior history of Echocardiogram examinations, most                  recent 05/15/2021. Stroke; Risk Factors:Hypertension, Diabetes                  and Dyslipidemia.  Sonographer:     Humphrey Rolls Referring Phys:  [id number] Andris Baumann Diagnosing Phys: Sena Slate  Sonographer Comments: No parasternal  window and suboptimal subcostal window. IMPRESSIONS  1. Left ventricular ejection fraction, by estimation, is 55 to 60%. The left ventricle has normal function. The left ventricle has no regional wall motion abnormalities.  2. Right ventricular systolic function is normal. The right ventricular size is normal.  3. The inferior vena cava is normal in size with greater than 50% respiratory variability, suggesting right atrial pressure of 3 mmHg. Conclusion(s)/Recommendation(s): INSUFFICIENT EXAM TO  EVALUATE FOR VALVULAR PATHOLOGY. FINDINGS  Left Ventricle: Poor echocardiographic windows. Left ventricular ejection fraction, by estimation, is 55 to 60%. The left ventricle has normal function. The left ventricle has no regional wall motion abnormalities. Right Ventricle: The right ventricular size is normal. Right ventricular systolic function is normal. Pericardium: There is no evidence of pericardial effusion. Venous: The inferior vena cava is normal in size with greater than 50% respiratory variability, suggesting right atrial pressure of 3 mmHg.  LV Volumes (MOD) LV vol d, Sena Slate: 77.9 ml LV vol d, MOD A4C: 68.7 ml LV vol s, MOD A2C: 31.4 ml LV vol s, MOD A4C: 16.5 ml LV SV MOD A2C:     46.5 ml LV SV MOD A4C:     68.7 ml LV SV MOD BP:      51.5 ml Ryan Orgel Electronically signed by Sena Slate Signature Date/Time: 05/19/2021/5:07:09 PM    Final       Subjective: Denies complaints.  No dyspnea, cough or chest pain reported.  As per RN, no acute issues reported.  Discharge Exam:  Vitals:   05/21/21 2108 05/22/21 0016 05/22/21 0556 05/22/21 0759  BP: (!) 185/73 (!) 152/59 124/61 (!) 173/60  Pulse: 71 62 63 70  Resp: Temp: 97.7 F (36.5 C) 97.8 F (36.6 C) 97.9 F (36.6 C) 98 F (36.7 C)  TempSrc: Oral  Oral   SpO2: 100% 100% 100% 100%  Weight: 79.4 kg     Height:  (1.575 m)       General exam: Elderly female, moderately built and nourished lying comfortably supine in bed without distress.  She is visually impaired and extremely hard of hearing despite hearing aids. Respiratory system: Clear to auscultation. Respiratory effort normal. Cardiovascular system: S1 & S2 heard, RRR. No JVD, murmurs, rubs, gallops or clicks. No pedal edema.   Gastrointestinal system: Abdomen is nondistended, soft and nontender. No organomegaly or masses felt. Normal bowel sounds heard. Central nervous system: Alert and oriented. No focal neurological deficits.  Extremely hard of hearing  making communication with her difficult despite her hearing aids. Extremities: Symmetric 5 x 5 power. Skin: No rashes, lesions or ulcers Psychiatry: Judgement and insight appear normal. Mood & affect appropriate.     The results of significant diagnostics from this hospitalization (including imaging, microbiology, ancillary and laboratory) are listed below for reference.     Microbiology: Recent Results (from the past 240 hour(s))  Resp Panel by RT-PCR (Flu A&B, Covid) Nasopharyngeal Swab     Status: None   Collection Time: 05/14/21  9:17 PM   Specimen: Nasopharyngeal Swab; Nasopharyngeal(NP) swabs in vial transport medium  Result Value Ref Range Status   SARS Coronavirus 2 by RT PCR NEGATIVE NEGATIVE Final    Comment: (NOTE) SARS-CoV-2 target nucleic acids are NOT DETECTED.  The SARS-CoV-2 RNA is generally detectable in upper respiratory specimens during the acute phase of infection. The lowest concentration of SARS-CoV-2 viral copies this assay can detect is 138 copies/mL. A negative result does not preclude SARS-Cov-2 infection  and should not be used as the sole basis for treatment or other patient management decisions. A negative result may occur with  improper specimen collection/handling, submission of specimen other than nasopharyngeal swab, presence of viral mutation(s) within the areas targeted by this assay, and inadequate number of viral copies(<138 copies/mL). A negative result must be combined with clinical observations, patient history, and epidemiological information. The expected result is Negative.  Fact Sheet for Patients:  BloggerCourse.com  Fact Sheet for Healthcare Providers:  SeriousBroker.it  This test is no t yet approved or cleared by the [place] FDA and  has been authorized for detection and/or diagnosis of SARS-CoV-2 by FDA under an Emergency Use Authorization (EUA). This EUA will remain  in  effect (meaning this test can be used) for the duration of the COVID-19 declaration under Section 564(b)(1) of the Act, 21 U.S.C.section 360bbb-3(b)(1), unless the authorization is terminated  or revoked sooner.       Influenza A by PCR NEGATIVE NEGATIVE Final   Influenza B by PCR NEGATIVE NEGATIVE Final    Comment: (NOTE) The Xpert Xpress SARS-CoV-2/FLU/RSV plus assay is intended as an aid in the diagnosis of influenza from Nasopharyngeal swab specimens and should not be used as a sole basis for treatment. Nasal washings and aspirates are unacceptable for Xpert Xpress SARS-CoV-2/FLU/RSV testing.  Fact Sheet for Patients: BloggerCourse.com  Fact Sheet for Healthcare Providers: SeriousBroker.it  This test is not yet approved or cleared by the [place] FDA and has been authorized for detection and/or diagnosis of SARS-CoV-2 by FDA under an Emergency Use Authorization (EUA). This EUA will remain in effect (meaning this test can be used) for the duration of the COVID-19 declaration under Section 564(b)(1) of the Act, 21 U.S.C. section 360bbb-3(b)(1), unless the authorization is terminated or revoked.  Performed at [facility], [address] [place], [place] [address]   Resp Panel by RT-PCR (Flu A&B, Covid) Nasopharyngeal Swab     Status: None   Collection Time: 05/19/21 12:02 AM   Specimen: Nasopharyngeal Swab; Nasopharyngeal(NP) swabs in vial transport medium  Result Value Ref Range Status   SARS Coronavirus 2 by RT PCR NEGATIVE NEGATIVE Final    Comment: (NOTE) SARS-CoV-2 target nucleic acids are NOT DETECTED.  The SARS-CoV-2 RNA is generally detectable in upper respiratory specimens during the acute phase of infection. The lowest concentration of SARS-CoV-2 viral copies this assay can detect is 138 copies/mL. A negative result does not preclude SARS-Cov-2 infection and should not be used as the sole basis  for treatment or other patient management decisions. A negative result may occur with  improper specimen collection/handling, submission of specimen other than nasopharyngeal swab, presence of viral mutation(s) within the areas targeted by this assay, and inadequate number of viral copies(<138 copies/mL). A negative result must be combined with clinical observations, patient history, and epidemiological information. The expected result is Negative.  Fact Sheet for Patients:  BloggerCourse.com  Fact Sheet for Healthcare Providers:  SeriousBroker.it  This test is no t yet approved or cleared by the [place] FDA and  has been authorized for detection and/or diagnosis of SARS-CoV-2 by FDA under an Emergency Use Authorization (EUA). This EUA will remain  in effect (meaning this test can be used) for the duration of the COVID-19 declaration under Section 564(b)(1) of the Act, 21 U.S.C.section 360bbb-3(b)(1), unless the authorization is terminated  or revoked sooner.       Influenza A by PCR NEGATIVE NEGATIVE Final   Influenza B by  PCR NEGATIVE NEGATIVE Final    Comment: (NOTE) The Xpert Xpress SARS-CoV-2/FLU/RSV plus assay is intended as an aid in the diagnosis of influenza from Nasopharyngeal swab specimens and should not be used as a sole basis for treatment. Nasal washings and aspirates are unacceptable for Xpert Xpress SARS-CoV-2/FLU/RSV testing.  Fact Sheet for Patients: BloggerCourse.com  Fact Sheet for Healthcare Providers: SeriousBroker.it  This test is not yet approved or cleared by the [place] FDA and has been authorized for detection and/or diagnosis of SARS-CoV-2 by FDA under an Emergency Use Authorization (EUA). This EUA will remain in effect (meaning this test can be used) for the duration of the COVID-19 declaration under Section 564(b)(1) of the Act, 21  U.S.C. section 360bbb-3(b)(1), unless the authorization is terminated or revoked.  Performed at [facility], [address] [place], [place] [address]      Labs: CBC: Recent Labs  Lab 05/16/21 0629 05/18/21 2115 05/20/21 0433  WBC 7.9 8.7 8.2  HGB 7.5* 8.0* 7.9*  HCT 26.7* 29.1* 28.0*  MCV 90.2 91.5 90.6  PLT 261 251 233    Basic Metabolic Panel: Recent Labs  Lab 05/16/21 0629 05/18/21 2115 05/20/21 0433  NA 144 140 142  K 4.1 4.3 3.7  CL 106 99 97*  CO2 31 32 35*  GLUCOSE 88 121* 136*  BUN 22 15 14   CREATININE 1.00 0.94 1.12*  CALCIUM 8.3* 8.6* 8.2*  MG 2.2  --   --     Liver Function Tests: Recent Labs  Lab 05/18/21 2115  AST 15  ALT 8  ALKPHOS 63  BILITOT 0.3  PROT 6.1*  ALBUMIN 2.7*    CBG: Recent Labs  Lab 05/21/21 1941 05/22/21 0013 05/22/21 0556 05/22/21 0801 05/22/21 1206  GLUCAP 214* 146* 120* 108* 137*    Urinalysis    Component Value Date/Time   COLORURINE YELLOW (A) 05/19/2021 0002   APPEARANCEUR CLEAR (A) 05/19/2021 0002   LABSPEC 1.019 05/19/2021 0002   PHURINE 5.0 05/19/2021 0002   GLUCOSEU NEGATIVE 05/19/2021 0002   HGBUR NEGATIVE 05/19/2021 0002   BILIRUBINUR NEGATIVE 05/19/2021 0002   BILIRUBINUR negative 02/19/2018 1557   KETONESUR 5 (A) 05/19/2021 0002   PROTEINUR NEGATIVE 05/19/2021 0002   UROBILINOGEN 0.2 02/19/2018 1557   NITRITE NEGATIVE 05/19/2021 0002   LEUKOCYTESUR NEGATIVE 05/19/2021 0002    Gust in detail with patient's son via phone, updated care and answered all questions.  Time coordinating discharge: 40 minutes  SIGNED:  07/19/2021, MD,  FACP, [facility], [facility], [facility] (Care Management Physician Certified). [facility]  To contact the attending provider between 7A-7P or the covering provider during after hours 7P-7A, please log into the web site [email or web address] and access using universal [place] password for that web site. If you do not have the password,  please call the hospital operator.

## 2021-05-22 NOTE — Progress Notes (Signed)
SATURATION QUALIFICATIONS: (This note is used to comply with regulatory documentation for home oxygen)  Patient Saturations on Room Air at Rest = 91%  Patient Saturations on Room Air while Ambulating = 86%  Patient Saturations on 2 Liters of oxygen while Ambulating = 93%  Please briefly explain why patient needs home oxygen: 

## 2021-05-22 NOTE — TOC Transition Note (Addendum)
Transition of Care (TOC) - CM/SW Discharge Note ? ? ?Patient Details  ?Name: Judy Harvey ?MRN: [id number] ?Date of Birth: 1927-10-04 ? ?Transition of Care (TOC) CM/SW Contact:  ?Sherica Paternostro E Ziyan Hillmer, LCSW ?Phone Number: ?05/22/2021, 1:00 PM ? ? ?Clinical Narrative:   Patient to DC home today. ?Notified by RN patient needs home o2 arranged. ?Home o2 ordered through [employer], she is aware patient is to DC home today.  ?Called [facility] and notified Rosey Bath of patient being DC today.  ? ?3:24- Called Jasmine with [employer] to check status of o2. Jasmine stated patient already has o2 set up through them last month.  ?Spoke with patient's son who confirmed patient has home o2. Advised he can get a tank from home to use for transport home.  ? ?Final next level of care: Home w Home Health Services ?Barriers to Discharge: Barriers Resolved ? ? ?Patient Goals and CMS Choice ?  ?  ?Choice offered to / list presented to : NA ? ?Discharge Placement ?  ?           ?  ?  ?  ?  ? ?Discharge Plan and Services ?  ?  ?Post Acute Care Choice: Durable Medical Equipment, Resumption of Svcs/PTA Provider          ?DME Arranged: Oxygen ?DME Agency: [employer] ?Date DME Agency Contacted: 05/22/21 ?  ?Representative spoke with at DME Agency: Leavy Cella ?HH Arranged: RN, PT, OT, Nurse's Aide ?HH Agency: Other - See comment ([facility]) ?Date HH Agency Contacted: 05/22/21 ?  ?Representative spoke with at [facility] Agency: sent fax ? ?Social Determinants of Health (SDOH) Interventions ?  ? ? ?Readmission Risk Interventions ?No flowsheet data found. ? ? ? ? ?

## 2021-05-22 NOTE — Progress Notes (Signed)
Mobility Specialist - Progress Note ? ? ? 05/22/21 1200  ?Mobility  ?Activity Refused mobility  ? ? ?Pt sleeping upon arrival using 2L. Pt refuses session d/t ambulating with PT earlier today --- despite max encouragement and education. Will attempt at another date and time. ? ?Merrily Brittle ?Mobility Specialist ?05/22/21, 12:02 PM ? ? ? ? ?

## 2021-05-22 NOTE — Discharge Instructions (Signed)

## 2021-05-24 ENCOUNTER — Emergency Department: Payer: Medicare Other

## 2021-05-24 ENCOUNTER — Other Ambulatory Visit: Payer: Self-pay

## 2021-05-24 ENCOUNTER — Observation Stay
Admission: EM | Admit: 2021-05-24 | Discharge: 2021-05-25 | Disposition: A | Discharge status: Home Health | Payer: Medicare Other | Attending: Internal Medicine | Admitting: Internal Medicine

## 2021-05-24 ENCOUNTER — Encounter: Payer: Self-pay | Admitting: Emergency Medicine

## 2021-05-24 DIAGNOSIS — Z87891 Personal history of nicotine dependence: Secondary | ICD-10-CM | POA: Diagnosis not present

## 2021-05-24 DIAGNOSIS — Z8673 Personal history of transient ischemic attack (TIA), and cerebral infarction without residual deficits: Secondary | ICD-10-CM

## 2021-05-24 DIAGNOSIS — E1122 Type 2 diabetes mellitus with diabetic chronic kidney disease: Secondary | ICD-10-CM | POA: Diagnosis not present

## 2021-05-24 DIAGNOSIS — Z7984 Long term (current) use of oral hypoglycemic drugs: Secondary | ICD-10-CM | POA: Diagnosis not present

## 2021-05-24 DIAGNOSIS — R778 Other specified abnormalities of plasma proteins: Secondary | ICD-10-CM | POA: Diagnosis present

## 2021-05-24 DIAGNOSIS — J189 Pneumonia, unspecified organism: Secondary | ICD-10-CM | POA: Diagnosis present

## 2021-05-24 DIAGNOSIS — Z79899 Other long term (current) drug therapy: Secondary | ICD-10-CM | POA: Insufficient documentation

## 2021-05-24 DIAGNOSIS — E11649 Type 2 diabetes mellitus with hypoglycemia without coma: Principal | ICD-10-CM | POA: Insufficient documentation

## 2021-05-24 DIAGNOSIS — Z20822 Contact with and (suspected) exposure to covid-19: Secondary | ICD-10-CM | POA: Insufficient documentation

## 2021-05-24 DIAGNOSIS — N1831 Chronic kidney disease, stage 3a: Secondary | ICD-10-CM | POA: Diagnosis not present

## 2021-05-24 DIAGNOSIS — E785 Hyperlipidemia, unspecified: Secondary | ICD-10-CM | POA: Diagnosis present

## 2021-05-24 DIAGNOSIS — E162 Hypoglycemia, unspecified: Secondary | ICD-10-CM | POA: Diagnosis present

## 2021-05-24 DIAGNOSIS — Z7982 Long term (current) use of aspirin: Secondary | ICD-10-CM | POA: Insufficient documentation

## 2021-05-24 DIAGNOSIS — J449 Chronic obstructive pulmonary disease, unspecified: Secondary | ICD-10-CM | POA: Diagnosis present

## 2021-05-24 DIAGNOSIS — F32A Depression, unspecified: Secondary | ICD-10-CM | POA: Diagnosis present

## 2021-05-24 DIAGNOSIS — I10 Essential (primary) hypertension: Secondary | ICD-10-CM | POA: Diagnosis present

## 2021-05-24 DIAGNOSIS — I129 Hypertensive chronic kidney disease with stage 1 through stage 4 chronic kidney disease, or unspecified chronic kidney disease: Secondary | ICD-10-CM | POA: Insufficient documentation

## 2021-05-24 DIAGNOSIS — E1129 Type 2 diabetes mellitus with other diabetic kidney complication: Secondary | ICD-10-CM | POA: Diagnosis present

## 2021-05-24 DIAGNOSIS — J984 Other disorders of lung: Secondary | ICD-10-CM | POA: Diagnosis present

## 2021-05-24 DIAGNOSIS — R531 Weakness: Secondary | ICD-10-CM

## 2021-05-24 DIAGNOSIS — H548 Legal blindness, as defined in USA: Secondary | ICD-10-CM | POA: Diagnosis present

## 2021-05-24 DIAGNOSIS — Z8616 Personal history of COVID-19: Secondary | ICD-10-CM | POA: Insufficient documentation

## 2021-05-24 DIAGNOSIS — R7989 Other specified abnormal findings of blood chemistry: Secondary | ICD-10-CM | POA: Diagnosis present

## 2021-05-24 DIAGNOSIS — D509 Iron deficiency anemia, unspecified: Secondary | ICD-10-CM | POA: Diagnosis present

## 2021-05-24 LAB — COMPREHENSIVE METABOLIC PANEL
ALT: 11 U/L (ref 0–44)
AST: 17 U/L (ref 15–41)
Albumin: 2.8 g/dL — ABNORMAL LOW (ref 3.5–5.0)
Alkaline Phosphatase: 63 U/L (ref 38–126)
Anion gap: 4 — ABNORMAL LOW (ref 5–15)
BUN: 16 mg/dL (ref 8–23)
CO2: 37 mmol/L — ABNORMAL HIGH (ref 22–32)
Calcium: 8.2 mg/dL — ABNORMAL LOW (ref 8.9–10.3)
Chloride: 92 mmol/L — ABNORMAL LOW (ref 98–111)
Creatinine, Ser: 1.04 mg/dL — ABNORMAL HIGH (ref 0.44–1.00)
GFR, Estimated: 50 mL/min — ABNORMAL LOW (ref >60)
Glucose, Bld: 352 mg/dL — ABNORMAL HIGH (ref 70–99)
Potassium: 4.1 mmol/L (ref 3.5–5.1)
Sodium: 133 mmol/L — ABNORMAL LOW (ref 135–145)
Total Bilirubin: 0.5 mg/dL (ref 0.3–1.2)
Total Protein: 6.2 g/dL — ABNORMAL LOW (ref 6.5–8.1)

## 2021-05-24 LAB — GLUCOSE, CAPILLARY
Glucose-Capillary: 64 mg/dL — ABNORMAL LOW (ref 70–99)
Glucose-Capillary: 101 mg/dL — ABNORMAL HIGH (ref 70–99)
Glucose-Capillary: 63 mg/dL — ABNORMAL LOW (ref 70–99)
Glucose-Capillary: 66 mg/dL — ABNORMAL LOW (ref 70–99)
Glucose-Capillary: 65 mg/dL — ABNORMAL LOW (ref 70–99)

## 2021-05-24 LAB — CBC
HCT: 30.9 % — ABNORMAL LOW (ref 36.0–46.0)
Hemoglobin: 8.6 g/dL — ABNORMAL LOW (ref 12.0–15.0)
MCH: 25.7 pg — ABNORMAL LOW (ref 26.0–34.0)
MCHC: 27.8 g/dL — ABNORMAL LOW (ref 30.0–36.0)
MCV: 92.5 fL (ref 80.0–100.0)
Platelets: 243 10*3/uL (ref 150–400)
RBC: 3.34 MIL/uL — ABNORMAL LOW (ref 3.87–5.11)
RDW: 23.9 % — ABNORMAL HIGH (ref 11.5–15.5)
WBC: 7 10*3/uL (ref 4.0–10.5)
nRBC: 0 % (ref 0.0–0.2)

## 2021-05-24 LAB — CBG MONITORING, ED
Glucose-Capillary: 108 mg/dL — ABNORMAL HIGH (ref 70–99)
Glucose-Capillary: 29 mg/dL — CL (ref 70–99)
Glucose-Capillary: 166 mg/dL — ABNORMAL HIGH (ref 70–99)
Glucose-Capillary: 134 mg/dL — ABNORMAL HIGH (ref 70–99)
Glucose-Capillary: 109 mg/dL — ABNORMAL HIGH (ref 70–99)

## 2021-05-24 LAB — RESP PANEL BY RT-PCR (FLU A&B, COVID) ARPGX2
Influenza A by PCR: NEGATIVE
Influenza B by PCR: NEGATIVE
SARS Coronavirus 2 by RT PCR: NEGATIVE

## 2021-05-24 LAB — TROPONIN I (HIGH SENSITIVITY)
Troponin I (High Sensitivity): 104 ng/L (ref <18)
Troponin I (High Sensitivity): 97 ng/L — ABNORMAL HIGH (ref <18)
Troponin I (High Sensitivity): 127 ng/L (ref <18)

## 2021-05-24 LAB — LACTIC ACID, PLASMA: Lactic Acid, Venous: 1.1 mmol/L (ref 0.5–1.9)

## 2021-05-24 MED ORDER — DULOXETINE HCL 30 MG PO CPEP
30.0000 mg | ORAL_CAPSULE | Freq: Every day | ORAL | Status: DC
  Administered 2021-05-25: 30 mg via ORAL
  Filled 2021-05-24 (×3): qty 1

## 2021-05-24 MED ORDER — AMOXICILLIN-POT CLAVULANATE 500-125 MG PO TABS
1.0000 | ORAL_TABLET | Freq: Two times a day (BID) | ORAL | Status: AC
  Administered 2021-05-24 (×2): 500 mg via ORAL
  Filled 2021-05-24 (×2): qty 1

## 2021-05-24 MED ORDER — TRAZODONE HCL 50 MG PO TABS
50.0000 mg | ORAL_TABLET | Freq: Every day | ORAL | Status: DC
  Administered 2021-05-24: 50 mg via ORAL
  Filled 2021-05-24: qty 1

## 2021-05-24 MED ORDER — INSULIN ASPART 100 UNIT/ML IJ SOLN: 0.0000 [IU] | Freq: Every day | INTRAMUSCULAR | Status: DC

## 2021-05-24 MED ORDER — ALBUTEROL SULFATE (2.5 MG/3ML) 0.083% IN NEBU
3.0000 mL | INHALATION_SOLUTION | RESPIRATORY_TRACT | Status: DC | PRN
Start: 2021-05-24 — End: 2021-05-25

## 2021-05-24 MED ORDER — HYDRALAZINE HCL 20 MG/ML IJ SOLN
5.0000 mg | INTRAMUSCULAR | Status: DC | PRN
  Administered 2021-05-25: 5 mg via INTRAVENOUS
  Filled 2021-05-24: qty 1

## 2021-05-24 MED ORDER — GABAPENTIN 300 MG PO CAPS
300.0000 mg | ORAL_CAPSULE | Freq: Every day | ORAL | Status: DC
  Administered 2021-05-24 – 2021-05-25 (×2): 300 mg via ORAL
  Filled 2021-05-24 (×2): qty 1

## 2021-05-24 MED ORDER — ASPIRIN EC 81 MG PO TBEC
81.0000 mg | DELAYED_RELEASE_TABLET | Freq: Every day | ORAL | Status: DC
  Administered 2021-05-24 – 2021-05-25 (×2): 81 mg via ORAL
  Filled 2021-05-24 (×2): qty 1

## 2021-05-24 MED ORDER — VITAMIN D 25 MCG (1000 UNIT) PO TABS
5000.0000 [IU] | ORAL_TABLET | Freq: Every day | ORAL | Status: DC
  Administered 2021-05-24 – 2021-05-25 (×2): 5000 [IU] via ORAL
  Filled 2021-05-24 (×2): qty 5

## 2021-05-24 MED ORDER — DEXTROSE 50 % IV SOLN
50.0000 mL | INTRAVENOUS | Status: DC | PRN
Start: 2021-05-24 — End: 2021-05-25

## 2021-05-24 MED ORDER — FERROUS SULFATE 325 (65 FE) MG PO TABS
325.0000 mg | ORAL_TABLET | Freq: Every day | ORAL | Status: DC
  Administered 2021-05-24 – 2021-05-25 (×2): 325 mg via ORAL
  Filled 2021-05-24 (×2): qty 1

## 2021-05-24 MED ORDER — INSULIN ASPART 100 UNIT/ML IJ SOLN: 0.0000 [IU] | Freq: Three times a day (TID) | INTRAMUSCULAR | Status: DC

## 2021-05-24 MED ORDER — MELATONIN 5 MG PO TABS
2.5000 mg | ORAL_TABLET | Freq: Every day | ORAL | Status: DC
  Administered 2021-05-24: 2.5 mg via ORAL
  Filled 2021-05-24: qty 1

## 2021-05-24 MED ORDER — DM-GUAIFENESIN ER 30-600 MG PO TB12
1.0000 | ORAL_TABLET | Freq: Two times a day (BID) | ORAL | Status: DC | PRN
Start: 2021-05-24 — End: 2021-05-25

## 2021-05-24 MED ORDER — PANTOPRAZOLE SODIUM 40 MG PO TBEC
40.0000 mg | DELAYED_RELEASE_TABLET | Freq: Two times a day (BID) | ORAL | Status: DC
  Administered 2021-05-24 – 2021-05-25 (×3): 40 mg via ORAL
  Filled 2021-05-24 (×3): qty 1

## 2021-05-24 MED ORDER — ENOXAPARIN SODIUM 40 MG/0.4ML IJ SOSY
40.0000 mg | PREFILLED_SYRINGE | Freq: Every day | INTRAMUSCULAR | Status: DC
  Administered 2021-05-24: 40 mg via SUBCUTANEOUS
  Filled 2021-05-24: qty 0.4

## 2021-05-24 MED ORDER — ONDANSETRON HCL 4 MG/2ML IJ SOLN
4.0000 mg | Freq: Three times a day (TID) | INTRAMUSCULAR | Status: DC | PRN
Start: 2021-05-24 — End: 2021-05-25

## 2021-05-24 MED ORDER — ATORVASTATIN CALCIUM 20 MG PO TABS
80.0000 mg | ORAL_TABLET | Freq: Every day | ORAL | Status: DC
  Administered 2021-05-24: 80 mg via ORAL
  Filled 2021-05-24: qty 4

## 2021-05-24 MED ORDER — CLOPIDOGREL BISULFATE 75 MG PO TABS
75.0000 mg | ORAL_TABLET | Freq: Every day | ORAL | Status: DC
  Administered 2021-05-24 – 2021-05-25 (×2): 75 mg via ORAL
  Filled 2021-05-24 (×2): qty 1

## 2021-05-24 MED ORDER — ACETAMINOPHEN 325 MG PO TABS
650.0000 mg | ORAL_TABLET | Freq: Four times a day (QID) | ORAL | Status: DC | PRN
  Administered 2021-05-24: 650 mg via ORAL
  Filled 2021-05-24: qty 2

## 2021-05-24 MED ORDER — DEXTROSE 50 % IV SOLN
INTRAVENOUS | Status: AC
  Administered 2021-05-24: 50 mL
  Filled 2021-05-24: qty 50

## 2021-05-24 NOTE — ED Notes (Signed)
Report to Kelly, RN.

## 2021-05-24 NOTE — H&P (Addendum)
History and Physical    Judy Harvey [id number] DOB: 02/18/1928 DOA: 05/24/2021  Referring MD/NP/PA:   PCP: Leonel Ramsay, MD   Patient coming from:  The patient is coming from home.    Chief Complaint: Weakness  HPI: Judy Harvey is a 88 y.o. female with medical history significant of hypertension, hyperlipidemia, diabetes mellitus, COPD, recent stroke 3/70/23, GERD, depression, anemia, neuropathy, legally blind, RLS, bilateral carotid artery stenosis, PVC, PSVT, CKD-3A, who presents with weakness.   Patient stated that she has gone through a lot.  She had stroke on 05/14/2021.  She was admitted to hospital again 3/7 - 3/17 due to generalized weakness and pneumonitis.  She was discharged on Augmentin, supposed to take it from 3/11 - 3/13.  She states that she has not completely recovered yet.  She has poor appetite and decreased oral intake.  She feels very weak last night.  Per EMS report, patient was found to have hypoglycemia with sugar down to 29 and treated with D10 and D50.  Patient does not have chest pain, cough, shortness breath with no fever or chills.  No nausea vomiting, diarrhea or abdominal pain.  No symptoms of UTI. Of note, patient used to be on Lantus which was discontinued at her recent discharge, but she was not aware that the Lantus was discontinued. Pt states that her son gave her Lantus 47 units yesterday. Our diabetic educator Mathis Bud did a great job in finding out this information.  Data Reviewed and ED Course: pt was found to have WBC 7.0, pending COVID PCR, stable renal function, temperature normal, blood pressure 188/86, 124/57, heart rate 48, 54, RR 21, 17, oxygen saturation 91-93% on room air.  Chest x-ray showed vascular congestion.  Patient is placed on telemetry bed for observation.   EKG: I have personally reviewed.  Sinus rhythm, QTc 502, bifascicular block, poor R wave progression   Review of Systems:   General: no fevers, chills, no  body weight gain, has poor appetite, has fatigue HEENT: legally blind, no hearing changes or sore throat Respiratory: no dyspnea, coughing, wheezing CV: no chest pain, no palpitations GI: no nausea, vomiting, abdominal pain, diarrhea, constipation GU: no dysuria, burning on urination, increased urinary frequency, hematuria  Ext: has trace leg edema Neuro: no unilateral weakness, numbness, or tingling, no vision change or hearing loss Skin: no rash, no skin tear. MSK: No muscle spasm, no deformity, no limitation of range of movement in spin Heme: No easy bruising.  Travel history: No recent long distant travel.   Allergy:  Allergies  Allergen Reactions   Adhesive [Tape] Other (See Comments)    "pulls my skin off" paper tape ok   Lyrica [Pregabalin]     Body spasms    Past Medical History:  Diagnosis Date   Anemia    Depression    Diabetes mellitus without complication (HCC)    GERD (gastroesophageal reflux disease)    History of COVID-19    Hyperlipidemia    Hypertension    Legally blind    Macular degeneration, bilateral    Peripheral neuropathy    Post herpetic neuralgia    located in her back   Stroke [facility])     Past Surgical History:  Procedure Laterality Date   ABDOMINAL HYSTERECTOMY     partial   APPENDECTOMY     BREAST SURGERY     reduction   CAROTID PTA/STENT INTERVENTION Left 10/18/2017   Procedure: CAROTID PTA/STENT INTERVENTION;  Surgeon: Leotis Pain  S, MD;  Location: [facility];  Service: Cardiovascular;  Laterality: Left;   CATARACT EXTRACTION, BILATERAL     CESAREAN SECTION     x3   COLONOSCOPY     ESOPHAGEAL DILATION      Social History:  reports that she quit smoking about 15 years ago. Her smoking use included cigarettes. She has a 150.00 pack-year smoking history. She has never used smokeless tobacco. She reports that she does not drink alcohol and does not use drugs.  Family History:  Family History  Problem Relation Age of Onset    Healthy Mother    Diabetes Father    Alzheimer's disease Father    Multiple sclerosis Sister    Multiple sclerosis Brother      Prior to Admission medications   Medication Sig Start Date End Date Taking? Authorizing Provider  acetaminophen (TYLENOL) 325 MG tablet Take 650 mg by mouth every 6 (six) hours as needed for mild pain or fever.    [provider]  albuterol (ACCUNEB) 1.25 MG/3ML nebulizer solution Take 3 mLs by nebulization every 6 (six) hours as needed for wheezing or shortness of breath. 11/12/19   [provider]  albuterol (VENTOLIN HFA) 108 (90 Base) MCG/ACT inhaler Inhale 2 puffs into the lungs every 6 (six) hours as needed for shortness of breath or wheezing. 11/06/19   [provider]  amoxicillin-clavulanate (AUGMENTIN) 500-125 MG tablet Take 1 tablet (500 mg total) by mouth 2 (two) times daily for 3 doses. 05/22/21 05/24/21  Hongalgi, Lenis Dickinson, MD  aspirin EC 81 MG tablet Take 81 mg by mouth daily.    [provider]  atorvastatin (LIPITOR) 80 MG tablet Take 1 tablet (80 mg total) by mouth at bedtime. 05/22/21   Hongalgi, Lenis Dickinson, MD  Cholecalciferol (VITAMIN D3) 5000 units CAPS Take 1 capsule (5,000 Units total) by mouth daily. 06/28/16   Plonk, Gwyndolyn Saxon, MD  clopidogrel (PLAVIX) 75 MG tablet Take 1 tablet by mouth daily. TAKE 1 TABLET BY MOUTH EVERY DAY 02/12/21   [provider]  DULoxetine (CYMBALTA) 30 MG capsule TAKE 1 CAPSULE BY MOUTH EVERY DAY Patient taking differently: Take 30 mg by mouth daily. 08/21/18   Juline Patch, MD  ferrous sulfate 325 (65 FE) MG tablet Take 325 mg by mouth daily.    [provider]  gabapentin (NEURONTIN) 300 MG capsule Take 300 mg by mouth daily. For 90 days 05/04/21 08/02/21  [provider]  Melatonin 3 MG CAPS Take by mouth. Take 3 mg by mouth at bedtime 01/28/21   [provider]  metFORMIN (GLUCOPHAGE-XR) 500 MG 24 hr tablet Take 500 mg by mouth 2 (two) times daily. 03/23/21    [provider]  metoprolol tartrate (LOPRESSOR) 25 MG tablet Take 0.5 tablets (12.5 mg total) by mouth 2 (two) times daily. 05/22/21   Hongalgi, Lenis Dickinson, MD  ondansetron (ZOFRAN) 8 MG tablet Take 8 mg by mouth every 8 (eight) hours as needed for nausea or vomiting.     [provider]  pantoprazole (PROTONIX) 40 MG tablet Take by mouth. Take 1 tablet (40 mg total) by mouth 2 (two) times daily before meals 02/23/21 02/23/22  [provider]  polyethylene glycol (MIRALAX / GLYCOLAX) 17 g packet Take 17 g by mouth daily. 05/23/21   Hongalgi, Lenis Dickinson, MD  traZODone (DESYREL) 50 MG tablet Take 50 mg by mouth at bedtime. 05/04/21   [provider]    Physical Exam: Vitals:  05/24/21 0830 05/24/21 0900 05/24/21 1000 05/24/21 1200  BP: (!) 113/54 (!) 124/57 (!) 144/64 (!) 149/122  Pulse: (!) 48 (!) 48 (!) 55 68  Resp: 16 17 17  (!) 27  Temp:      TempSrc:      SpO2: 92% 93% 92% 92%  Weight:      Height:       General: Not in acute distress HEENT:       Eyes: PERRL, EOMI, no scleral icterus.       ENT: No discharge from the ears and nose, no pharynx injection, no tonsillar enlargement.        Neck: No JVD, no bruit, no mass felt. Heme: No neck lymph node enlargement. Cardiac: S1/S2, RRR, No murmurs, No gallops or rubs. Respiratory: No rales, wheezing, rhonchi or rubs. GI: Soft, nondistended, nontender, no rebound pain, no organomegaly, BS present. GU: No hematuria Ext: has trace leg edema bilaterally. 1+DP/PT pulse bilaterally. Musculoskeletal: No joint deformities, No joint redness or warmth, no limitation of ROM in spin. Skin: No rashes.  Neuro: Alert, oriented X3, cranial nerves II-XII grossly intact except for very poor vision (legally blind), moves all extremities normally. Psych: Patient is not psychotic, no suicidal or hemocidal ideation.  Labs on Admission: I have personally reviewed following labs and imaging studies  CBC: Recent Labs  Lab  05/18/21 2115 05/20/21 0433 05/24/21 0702  WBC 8.7 8.2 7.0  HGB 8.0* 7.9* 8.6*  HCT 29.1* 28.0* 30.9*  MCV 91.5 90.6 92.5  PLT 251 233 0000000   Basic Metabolic Panel: Recent Labs  Lab 05/18/21 2115 05/20/21 0433 05/24/21 0702  NA 140 142 133*  K 4.3 3.7 4.1  CL 99 97* 92*  CO2 32 35* 37*  GLUCOSE 121* 136* 352*  BUN 15 14 16   CREATININE 0.94 1.12* 1.04*  CALCIUM 8.6* 8.2* 8.2*   GFR: Estimated Creatinine Clearance: 33.7 mL/min (A) (by C-G formula based on SCr of 1.04 mg/dL (H)). Liver Function Tests: Recent Labs  Lab 05/18/21 2115 05/24/21 0702  AST 15 17  ALT 8 11  ALKPHOS 63 63  BILITOT 0.3 0.5  PROT 6.1* 6.2*  ALBUMIN 2.7* 2.8*   No results for input(s): LIPASE, AMYLASE in the last 168 hours. No results for input(s): AMMONIA in the last 168 hours. Coagulation Profile: Recent Labs  Lab 05/19/21 0002  INR 1.1   Cardiac Enzymes: No results for input(s): CKTOTAL, CKMB, CKMBINDEX, TROPONINI in the last 168 hours. BNP (last 3 results) No results for input(s): PROBNP in the last 8760 hours. HbA1C: No results for input(s): HGBA1C in the last 72 hours. CBG: Recent Labs  Lab 05/24/21 0649 05/24/21 0710 05/24/21 0801 05/24/21 0946 05/24/21 1205  GLUCAP 29* 108* 166* 134* 109*   Lipid Profile: No results for input(s): CHOL, HDL, LDLCALC, TRIG, CHOLHDL, LDLDIRECT in the last 72 hours. Thyroid Function Tests: No results for input(s): TSH, T4TOTAL, FREET4, T3FREE, THYROIDAB in the last 72 hours. Anemia Panel: No results for input(s): VITAMINB12, FOLATE, FERRITIN, TIBC, IRON, RETICCTPCT in the last 72 hours. Urine analysis:    Component Value Date/Time   COLORURINE YELLOW (A) 05/19/2021 0002   APPEARANCEUR CLEAR (A) 05/19/2021 0002   LABSPEC 1.019 05/19/2021 0002   PHURINE 5.0 05/19/2021 0002   GLUCOSEU NEGATIVE 05/19/2021 0002   HGBUR NEGATIVE 05/19/2021 0002   BILIRUBINUR NEGATIVE 05/19/2021 0002   BILIRUBINUR negative 02/19/2018 1557   KETONESUR 5  (A) 05/19/2021 0002   PROTEINUR NEGATIVE 05/19/2021 0002   UROBILINOGEN 0.2  02/19/2018 1557   NITRITE NEGATIVE 05/19/2021 0002   LEUKOCYTESUR NEGATIVE 05/19/2021 0002   Sepsis Labs: @LABRCNTIP (procalcitonin:4,lacticidven:4) ) Recent Results (from the past 240 hour(s))  Resp Panel by RT-PCR (Flu A&B, Covid) Nasopharyngeal Swab     Status: None   Collection Time: 05/14/21  9:17 PM   Specimen: Nasopharyngeal Swab; Nasopharyngeal(NP) swabs in vial transport medium  Result Value Ref Range Status   SARS Coronavirus 2 by RT PCR NEGATIVE NEGATIVE Final    Comment: (NOTE) SARS-CoV-2 target nucleic acids are NOT DETECTED.  The SARS-CoV-2 RNA is generally detectable in upper respiratory specimens during the acute phase of infection. The lowest concentration of SARS-CoV-2 viral copies this assay can detect is 138 copies/mL. A negative result does not preclude SARS-Cov-2 infection and should not be used as the sole basis for treatment or other patient management decisions. A negative result may occur with  improper specimen collection/handling, submission of specimen other than nasopharyngeal swab, presence of viral mutation(s) within the areas targeted by this assay, and inadequate number of viral copies(<138 copies/mL). A negative result must be combined with clinical observations, patient history, and epidemiological information. The expected result is Negative.  Fact Sheet for Patients:  EntrepreneurPulse.com.au  Fact Sheet for Healthcare Providers:  IncredibleEmployment.be  This test is no t yet approved or cleared by the [place] FDA and  has been authorized for detection and/or diagnosis of SARS-CoV-2 by FDA under an Emergency Use Authorization (EUA). This EUA will remain  in effect (meaning this test can be used) for the duration of the COVID-19 declaration under Section 564(b)(1) of the Act, 21 U.S.C.section 360bbb-3(b)(1), unless the  authorization is terminated  or revoked sooner.       Influenza A by PCR NEGATIVE NEGATIVE Final   Influenza B by PCR NEGATIVE NEGATIVE Final    Comment: (NOTE) The Xpert Xpress SARS-CoV-2/FLU/RSV plus assay is intended as an aid in the diagnosis of influenza from Nasopharyngeal swab specimens and should not be used as a sole basis for treatment. Nasal washings and aspirates are unacceptable for Xpert Xpress SARS-CoV-2/FLU/RSV testing.  Fact Sheet for Patients: EntrepreneurPulse.com.au  Fact Sheet for Healthcare Providers: IncredibleEmployment.be  This test is not yet approved or cleared by the [place] FDA and has been authorized for detection and/or diagnosis of SARS-CoV-2 by FDA under an Emergency Use Authorization (EUA). This EUA will remain in effect (meaning this test can be used) for the duration of the COVID-19 declaration under Section 564(b)(1) of the Act, 21 U.S.C. section 360bbb-3(b)(1), unless the authorization is terminated or revoked.  Performed at [facility], [address] [place], [place] [address]   Resp Panel by RT-PCR (Flu A&B, Covid) Nasopharyngeal Swab     Status: None   Collection Time: 05/19/21 12:02 AM   Specimen: Nasopharyngeal Swab; Nasopharyngeal(NP) swabs in vial transport medium  Result Value Ref Range Status   SARS Coronavirus 2 by RT PCR NEGATIVE NEGATIVE Final    Comment: (NOTE) SARS-CoV-2 target nucleic acids are NOT DETECTED.  The SARS-CoV-2 RNA is generally detectable in upper respiratory specimens during the acute phase of infection. The lowest concentration of SARS-CoV-2 viral copies this assay can detect is 138 copies/mL. A negative result does not preclude SARS-Cov-2 infection and should not be used as the sole basis for treatment or other patient management decisions. A negative result may occur with  improper specimen collection/handling, submission of specimen other than  nasopharyngeal swab, presence of viral mutation(s) within the areas targeted by this assay, and  inadequate number of viral copies(<138 copies/mL). A negative result must be combined with clinical observations, patient history, and epidemiological information. The expected result is Negative.  Fact Sheet for Patients:  EntrepreneurPulse.com.au  Fact Sheet for Healthcare Providers:  IncredibleEmployment.be  This test is no t yet approved or cleared by the [place] FDA and  has been authorized for detection and/or diagnosis of SARS-CoV-2 by FDA under an Emergency Use Authorization (EUA). This EUA will remain  in effect (meaning this test can be used) for the duration of the COVID-19 declaration under Section 564(b)(1) of the Act, 21 U.S.C.section 360bbb-3(b)(1), unless the authorization is terminated  or revoked sooner.       Influenza A by PCR NEGATIVE NEGATIVE Final   Influenza B by PCR NEGATIVE NEGATIVE Final    Comment: (NOTE) The Xpert Xpress SARS-CoV-2/FLU/RSV plus assay is intended as an aid in the diagnosis of influenza from Nasopharyngeal swab specimens and should not be used as a sole basis for treatment. Nasal washings and aspirates are unacceptable for Xpert Xpress SARS-CoV-2/FLU/RSV testing.  Fact Sheet for Patients: EntrepreneurPulse.com.au  Fact Sheet for Healthcare Providers: IncredibleEmployment.be  This test is not yet approved or cleared by the [place] FDA and has been authorized for detection and/or diagnosis of SARS-CoV-2 by FDA under an Emergency Use Authorization (EUA). This EUA will remain in effect (meaning this test can be used) for the duration of the COVID-19 declaration under Section 564(b)(1) of the Act, 21 U.S.C. section 360bbb-3(b)(1), unless the authorization is terminated or revoked.  Performed at [facility], [address], [place], [place]  [address]      Radiological Exams on Admission: DG Chest [facility] 1 View  Result Date: 05/24/2021 CLINICAL DATA:  Weakness. EXAM: PORTABLE CHEST 1 VIEW COMPARISON:  05/19/2021 FINDINGS: Stable cardiomediastinal contours. Diffuse pulmonary vascular congestion is identified and appears increased from previous exam. Chronic right rib deformities are again noted and appear unchanged. IMPRESSION: Increased pulmonary vascular congestion compatible with CHF. Electronically Signed   By: Kerby Moors M.D.   On: 05/24/2021 08:01      Assessment/Plan Principal Problem:   Hypoglycemia Active Problems:   Pneumonitis   Generalized weakness   Recent cerebrovascular accident 05/14/21 (CVA)   Iron deficiency anemia   Legally blind   Depression   Hyperlipidemia   COPD (chronic obstructive pulmonary disease) (HCC)   Chronic kidney disease, stage 3a (HCC)   HTN (hypertension)   Type II diabetes mellitus with renal manifestations ([place])   Hypoglycemia: Blood sugar down to 29, has been improving, currently 103 on last CBG check.  This is likely due to misuse of her Lantus which was discontinued.  -Placed telemetry bed for observation -Check CBG every hour x3 -D50 as needed  Type II diabetes mellitus with renal manifestations: Recent A1c 6.8.  She was Lantus and metformin which will discontinued recently -Sliding scale insulin sliding scale insulin  Recent pneumonitis: No fever or chills.  No leukocytosis.  Denies chest pain or shortness breath. -That patient finish Augmentin today for 2 doses  Generalized weakness: Most likely due to hypoglycemia -PT/OT  Recent cerebrovascular accident 05/14/21 (CVA) -Lipitor, ASA and plavix  Iron deficiency anemia: Hemoglobin stable 8.6 (7.9 on 05/20/2021) -Continue iron supplement  Legally blind -Fall precaution  Depression -Cymbalta,  Hyperlipidemia -Lipitor  COPD (chronic obstructive pulmonary disease) ([place]): Stable -As needed albuterol  Chronic kidney  disease, stage 3a ([place]): Stable -Follow-up with BMP  HTN (hypertension) -Hold metoprolol due to bradycardia -IV hydralazine as needed  Elevated trop: trop 104. Trending down from 696 on 05/19/21. No CP -on ASA and lipitor -trend trop       DVT ppx: SQ Lovenox  Code Status: DNR per her son  Family Communication:    Yes, patient's son   at bed side.     Disposition Plan:  Anticipate discharge back to previous environment  Consults called:  none  Admission status and Level of care: Telemetry Medical:    for obs     Severity of Illness:  The appropriate patient status for this patient is OBSERVATION. Observation status is judged to be reasonable and necessary in order to provide the required intensity of service to ensure the patient's safety. The patient's presenting symptoms, physical exam findings, and initial radiographic and laboratory data in the context of their medical condition is felt to place them at decreased risk for further clinical deterioration. Furthermore, it is anticipated that the patient will be medically stable for discharge from the hospital within 2 midnights of admission.        Date of Service 05/24/2021    Ivor Costa [facility]   If 7PM-7AM, please contact night-coverage [email or web address] 05/24/2021, 1:07 PM

## 2021-05-24 NOTE — ED Triage Notes (Signed)
Pt arrives via [facility], C/O weakness and unable to get out of bed since 0000 last night.  When EMS arrived BG was40, D10 given, BG then 85.  BG 29 when rechecked upon arrival, D50 given.  Pt states that she is a diabetic and did not eat dinner, had only soup for breakfast and lunch.  States that the food will not go down and that she has no appetite. ?

## 2021-05-24 NOTE — Progress Notes (Signed)
PT Cancellation Note ? ?Patient Details ?Name: Judy Harvey ?MRN: [id number] ?DOB: [birth year] ? ? ?Cancelled Treatment:    Reason Eval/Treat Not Completed: Fatigue/lethargy limiting ability to participate.  PT consult received.  Chart reviewed.  Pt resting in bed upon PT arrival.  Pt reporting being too tired to participate in therapy today (pt declined PT session).  Will re-attempt PT evaluation tomorrow. ? ?Hendricks Limes, PT ?05/24/21, 4:59 PM ? ?

## 2021-05-24 NOTE — Progress Notes (Addendum)
Inpatient Diabetes Program Recommendations ? ?AACE/ADA: New Consensus Statement on Inpatient Glycemic Control ? ?Target Ranges:  Prepandial:   less than 140 mg/dL ?     Peak postprandial:   less than 180 mg/dL (1-2 hours) ?     Critically ill patients:  140 - 180 mg/dL  ? ? Latest Reference Range & Units 05/24/21 06:49 05/24/21 07:10  ?Glucose-Capillary 70 - 99 mg/dL 29 (LL) 809 (H)  ? ?Review of Glycemic Control ? ?Diabetes history: DM2 ?Outpatient Diabetes medications: Metformin XR 500 mg BID (states she was taken off Metformin months ago), Lantus 47 units QHS ?Current orders for Inpatient glycemic control: CBGs ? ?Inpatient Diabetes Program Recommendations:   ? ?Inulin: If admitted, please consider ordering CBGs AC&HS with Novolog 0-9 units TID with meals and Novolog 0-5 units QHS. ? ?Outpatient: Patient reports that she was still taking Lantus and that her son gave her 67 units yesterday. ? ?NOTE: Per chart, patient in ED with hypoglycemia, weakness. Noted patient was recently inpatient 05/19/21-05/22/21 and per discharge summary, patient had been on Lantus 50 units QHS and Lantus was discontinued at discharge on 05/22/21.  ? ?Addendum 05/24/21@11 :40-Spoke with patient at bedside regarding DM control and outpatient DM medications. Patient states that her son gives her all her medications and that he gave her Lantus 47 units yesterday. Patient reports that she was taken off Metformin months ago. Patient confirms that she was recently inpatient 05/19/21-05/22/21. Informed patient that per the discharge summary on 05/22/21, Lantus was discontinued. Patient states that she was not aware that the Lantus was discontinued but she notes that her son was giving her a little less Lantus because she was not eating much (giving 47 units instead of 50 units). Patient reports that she nor her son check her glucose at home but she has a nurse that comes once a week and they check her glucose. Patient states she does not recall what  glucose has been when they checked it. Discussed hypoglycemia and acute dangers of hypoglycemia. Patient states that I could call her son to discuss her DM medications since he gives her all her medications. Informed patient that she and her son will need to pay close attention to her discharge paperwork as Lantus will likely be discontinued. Asked patient to start checking her glucose 2-3 times a day so her PCP can see how her glucose is trending and decide if she needs any other changes with DM medications. Patient states she can not see well enough to check her glucose so her son would have to check it for her. Patient verbalized understanding of information and has no questions at this time. Tried to call patient's son Judy Harvey [phone]) but no answer and voice mail is full so not able to leave a message. Sent communication to Judy Harvey that patient was still taking Lantus which lead to hypoglycemia.  ? ?Addendum 05/24/21@14 :24-Received return call from patient's son Judy Harvey. Judy Harvey reports that he did give his mother Lantus but it was 44 units last night. He states that he has not been checking his mother's glucose at home. Discussed that during last hospital admission, at time of discharge per the summary the Lantus was to be stopped. Judy Harvey states that he did not know that the Lantus was stopped so he has continued to give it to his mother. He states that she is no longer on Metformin and currently the only DM medication she has at home to take is the Lantus. Discussed that given hypoglycemia  and that she is 88 years old, her glucose goals will not be as stringent and that her PCP may be good with target glucose of 150 mg/dl. Explained that during last hospitalization, she did not receive any insulin and glucose trending fairly well. Explained importance of checking glucose and asked that it be checked at least once a day. Informed Judy Harvey that Lantus will likely be discontinued and asked that he pay attention to  discharge summary to be sure. Explained that if discharging provider wants patient to take any DM medication it will be listed on discharge summary. Judy Harvey verbalized understanding of information discussed and states he has no questions at this time. ? ?Thanks, ?Judy Penner, Judy Harvey, Judy Harvey, Judy Harvey ?Diabetes Coordinator ?Inpatient Diabetes Program ?[phone] (Team Pager from 8am to 5pm) ? ?

## 2021-05-24 NOTE — ED Notes (Signed)
Pt given decaf coffee with milk per request. Pt able to hold drink, table is at bedside for use.  ?

## 2021-05-24 NOTE — ED Notes (Signed)
Pt given lunch tray and this RN helped cut meat up to make it easier for pt to consume, this RN also assisted with condiment.  ? ?AC CBG 109 ?

## 2021-05-24 NOTE — ED Notes (Signed)
D50 amp given 0654 correction to MAR ?

## 2021-05-24 NOTE — ED Notes (Signed)
Lab called to run lactic acid that was sent with initial labs.  ?

## 2021-05-24 NOTE — ED Provider Notes (Signed)
? ?[facility] ?Provider Note ? ? ? Event Date/Time  ? First MD Initiated Contact with Patient 05/24/21 0701   ?  (approximate) ? ? ?History  ? ?Weakness (hypoglycemia) ? ? ?HPI ? ?Judy Harvey is a 88 y.o. female with a history that includes type 2 diabetes with insulin use, legal blindness, COPD, hypertension, CVA who presents with weakness.  Patient arrives via EMS, has been unable to get out of bed on her own.  EMS reports glucose was 40 on arrival, they did give D10 which improved her glucose to 85. ? ?Upon arrival to the ED glucose was 29, D50 was given here.  Patient denies focal weakness.  No headache, no chest pain.  No shortness of breath.  No cough ?  ? ? ?Physical Exam  ? ?Triage Vital Signs: ?ED Triage Vitals  ?Enc Vitals Group  ?   BP 05/24/21 0658 (!) 188/86  ?   Pulse Rate 05/24/21 0658 (!) 54  ?   Resp 05/24/21 0658 (!) 21  ?   Temp 05/24/21 0734 97.8 ?F (36.6 ?C)  ?   Temp Source 05/24/21 0734 Oral  ?   SpO2 05/24/21 0704 93 %  ?   Weight 05/24/21 0700 79.4 kg (175 lb 0.7 oz)  ?   Height 05/24/21 0700 1.575 m (5\' 2" )  ?   Head Circumference --   ?   Peak Flow --   ?   Pain Score 05/24/21 0659 0  ?   Pain Loc --   ?   Pain Edu? --   ?   Excl. in GC? --   ? ? ?Most recent vital signs: ?Vitals:  ? 05/24/21 0900 05/24/21 1000  ?BP: (!) 124/57 (!) 144/64  ?Pulse: (!) 48 (!) 55  ?Resp: 17 17  ?Temp:    ?SpO2: 93% 92%  ? ? ? ?General: Awake, no distress.  ?CV:  Good peripheral perfusion.  Regular rate and rhythm ?Resp:  Normal effort.  Clear to auscultation bilaterally ?Abd:  No distention.  No tenderness palpation ?Other:  Cranial nerves II through XII are normal, normal strength in all extremities ? ? ?ED Results / Procedures / Treatments  ? ?Labs ?(all labs ordered are listed, but only abnormal results are displayed) ?Labs Reviewed  ?CBC - Abnormal; Notable for the following components:  ?    Result Value  ? RBC 3.34 (*)   ? Hemoglobin 8.6 (*)   ? HCT 30.9 (*)   ? MCH 25.7  (*)   ? MCHC 27.8 (*)   ? RDW 23.9 (*)   ? All other components within normal limits  ?COMPREHENSIVE METABOLIC PANEL - Abnormal; Notable for the following components:  ? Sodium 133 (*)   ? Chloride 92 (*)   ? CO2 37 (*)   ? Glucose, Bld 352 (*)   ? Creatinine, Ser 1.04 (*)   ? Calcium 8.2 (*)   ? Total Protein 6.2 (*)   ? Albumin 2.8 (*)   ? GFR, Estimated 50 (*)   ? Anion gap 4 (*)   ? All other components within normal limits  ?CBG MONITORING, ED - Abnormal; Notable for the following components:  ? Glucose-Capillary 29 (*)   ? All other components within normal limits  ?CBG MONITORING, ED - Abnormal; Notable for the following components:  ? Glucose-Capillary 108 (*)   ? All other components within normal limits  ?CBG MONITORING, ED - Abnormal; Notable for the following components:  ?  Glucose-Capillary 166 (*)   ? All other components within normal limits  ?CBG MONITORING, ED - Abnormal; Notable for the following components:  ? Glucose-Capillary 134 (*)   ? All other components within normal limits  ?RESP PANEL BY RT-PCR (FLU A&B, COVID) [id number]  ?LACTIC ACID, PLASMA  ?URINALYSIS, COMPLETE (UACMP) WITH MICROSCOPIC  ?TROPONIN I (HIGH SENSITIVITY)  ? ? ? ?EKG ? ?ED ECG REPORT ?I, Jene Every, the attending physician, personally viewed and interpreted this ECG. ? ?Date: 05/24/2021 ? ?Rhythm: Junctional rhythm ?QRS Axis: normal ?Intervals: Right bundle branch block ?ST/T Wave abnormalities: normal ?Narrative Interpretation: no evidence of acute ischemia ? ? ? ?RADIOLOGY ?Chest x-ray viewed interpreted by me, no acute abnormality ? ? ? ?PROCEDURES: ? ?Critical Care performed: yes ?CRITICAL CARE ?Performed by: Jene Every ? ? ?Total critical care time: 30 minutes ? ?Critical care time was exclusive of separately billable procedures and treating other patients. ? ?Critical care was necessary to treat or prevent imminent or life-threatening deterioration. ? ?Critical care was time spent personally by me on the following  activities: development of treatment plan with patient and/or surrogate as well as nursing, discussions with consultants, evaluation of patient's response to treatment, examination of patient, obtaining history from patient or surrogate, ordering and performing treatments and interventions, ordering and review of laboratory studies, ordering and review of radiographic studies, pulse oximetry and re-evaluation of patient's condition. ? ? ?Procedures ? ? ?MEDICATIONS ORDERED IN ED: ?Medications  ?dextrose 50 % solution 50 mL (has no administration in time range)  ?ondansetron (ZOFRAN) injection 4 mg (has no administration in time range)  ?acetaminophen (TYLENOL) tablet 650 mg (has no administration in time range)  ?hydrALAZINE (APRESOLINE) injection 5 mg (has no administration in time range)  ?albuterol (PROVENTIL) (2.5 MG/3ML) 0.083% nebulizer solution 3 mL (has no administration in time range)  ?dextromethorphan-guaiFENesin (MUCINEX DM) 30-600 MG per 12 hr tablet 1 tablet (has no administration in time range)  ?enoxaparin (LOVENOX) injection 40 mg (has no administration in time range)  ?dextrose 50 % solution (50 mLs  Given 05/24/21 0708)  ? ? ? ?IMPRESSION / MDM / ASSESSMENT AND PLAN / ED COURSE  ?I reviewed the triage vital signs and the nursing notes. ? ? ?Patient presents with weakness, found to be significantly hypoglycemic.  Given D50 here in the emergency department IV. ? ?She is improved after D50.  Vital signs demonstrate hypertension, afebrile oxygen saturation 93%, I believe she is on 2 L nasal cannula at home ? ?Pending labs to evaluate for infectious cause of hypoglycemia. ? ?Patient was recently discharged from the hospital 2 days ago, record reviewed ? ?Lab work demonstrates reassuring white blood cell count, hemoglobin unchanged from prior.  Lactic acid is normal. ? ?Patient's glucose continues to decline, have discussed with the hospitalist for admission ? ? ?  ? ? ?FINAL CLINICAL IMPRESSION(S) / ED  DIAGNOSES  ? ?Final diagnoses:  ?Hypoglycemia  ? ? ? ?Rx / DC Orders  ? ?ED Discharge Orders   ? ? None  ? ?  ? ? ? ?Note:  This document was prepared using Dragon voice recognition software and may include unintentional dictation errors. ?  ?Jene Every, MD ?05/24/21 1142 ? ?

## 2021-05-24 NOTE — ED Notes (Signed)
Pt provided warm blankets and placed on monitoring system at this time.  ?

## 2021-05-24 NOTE — ED Notes (Signed)
Pt awake and alert at this time. Repeat CBG 108. [place] placed.  ?

## 2021-05-25 DIAGNOSIS — R531 Weakness: Secondary | ICD-10-CM | POA: Diagnosis not present

## 2021-05-25 DIAGNOSIS — E162 Hypoglycemia, unspecified: Secondary | ICD-10-CM | POA: Diagnosis not present

## 2021-05-25 DIAGNOSIS — J449 Chronic obstructive pulmonary disease, unspecified: Secondary | ICD-10-CM | POA: Diagnosis not present

## 2021-05-25 DIAGNOSIS — F32A Depression, unspecified: Secondary | ICD-10-CM | POA: Diagnosis not present

## 2021-05-25 LAB — URINALYSIS, COMPLETE (UACMP) WITH MICROSCOPIC
Bilirubin Urine: NEGATIVE
Glucose, UA: 50 mg/dL — AB
Ketones, ur: NEGATIVE mg/dL
Leukocytes,Ua: NEGATIVE
Nitrite: NEGATIVE
Protein, ur: NEGATIVE mg/dL
Specific Gravity, Urine: 1.015 (ref 1.005–1.030)
pH: 7 (ref 5.0–8.0)

## 2021-05-25 LAB — TROPONIN I (HIGH SENSITIVITY)
Troponin I (High Sensitivity): 129 ng/L (ref <18)
Troponin I (High Sensitivity): 137 ng/L (ref <18)

## 2021-05-25 LAB — GLUCOSE, CAPILLARY
Glucose-Capillary: 103 mg/dL — ABNORMAL HIGH (ref 70–99)
Glucose-Capillary: 84 mg/dL (ref 70–99)
Glucose-Capillary: 86 mg/dL (ref 70–99)
Glucose-Capillary: 97 mg/dL (ref 70–99)

## 2021-05-25 MED ORDER — INSULIN GLARGINE 100 UNIT/ML ~~LOC~~ SOLN: 10.0000 [IU] | Freq: Every day | SUBCUTANEOUS | 11 refills | Status: AC

## 2021-05-25 NOTE — Evaluation (Signed)
Occupational Therapy Evaluation ?Patient Details ?Name: Judy Harvey ?MRN: [id number] ?DOB: 25-Apr-1927 ?Today's Date: 05/25/2021 ? ? ?History of Present Illness 88yo female with PMHx including depression, anxiety, HLD, insulin dependent DM, CAD, restless leg, stroke, macular degeneration, peripheral neuropathy, post herpetic neuralgia, legally blind, HOH, major depressive disorder, TIA, hospitalized 3/3-3/5 for acute CVA, 3/7-3/11 for weakness and pneumonitis, returned to ED on 3/13 unable to get out of bed and admitted for hypoglycemia.  ? ?Clinical Impression ?  ?Pt seen for OT evaluation this date, recently seen during last admission by this therapist. Pt presents functionally comparable, requiring grossly MIN A for bathing, dressing, and toileting, supv for bed mobility with VC to support participation due to low vision deficits in unfamiliar environment. Recommend return home with [facility] services and increased aide hours to support ADL/IADL.   ?   ? ?Recommendations for follow up therapy are one component of a multi-disciplinary discharge planning process, led by the attending physician.  Recommendations may be updated based on patient status, additional functional criteria and insurance authorization.  ? ?Follow Up Recommendations ? Home health OT (also recommend increased HH aide hours)  ?  ?Assistance Recommended at Discharge Frequent or constant Supervision/Assistance  ?Patient can return home with the following A little help with walking and/or transfers;A little help with bathing/dressing/bathroom;Direct supervision/assist for medications management;Assistance with cooking/housework;Assist for transportation;Help with stairs or ramp for entrance ? ?  ?Functional Status Assessment ? Patient has had a recent decline in their functional status and demonstrates the ability to make significant improvements in function in a reasonable and predictable amount of time.  ?Equipment Recommendations ?  BSC/3in1;Tub/shower seat  ?  ?Recommendations for Other Services   ? ? ?  ?Precautions / Restrictions Precautions ?Precautions: Fall ?Precaution Comments: legally blind, HOH ?Restrictions ?Weight Bearing Restrictions: No  ? ?  ? ?Mobility Bed Mobility ?Overal bed mobility: Needs Assistance ?Bed Mobility: Rolling, Supine to Sit, Sit to Supine ?Rolling: Supervision ?  ?Supine to sit: Min assist ?Sit to supine: Min assist ?  ?  ?  ? ?Transfers ?  ?  ?  ?  ?  ?  ?  ?  ?  ?  ?  ? ?  ?Balance Overall balance assessment: Needs assistance ?Sitting-balance support: Feet supported, No upper extremity supported ?Sitting balance-Leahy Scale: Fair ?  ?  ?  ?  ?  ?  ?  ?  ?  ?  ?  ?  ?  ?  ?  ?  ?   ? ?ADL either performed or assessed with clinical judgement  ? ?ADL Overall ADL's : Needs assistance/impaired ?  ?  ?  ?  ?  ?  ?  ?  ?  ?  ?  ?  ?  ?  ?  ?  ?  ?  ?  ?General ADL Comments: Pt requires MIN A for LB ADL tasks from seated position, set up with use of low vision strategies for table top ADL (self feeding, grooming), MOD A for pericare at bed level at start of session.  ? ? ? ?Vision   ?   ?   ?Perception   ?  ?Praxis   ?  ? ?Pertinent Vitals/Pain Pain Assessment ?Pain Assessment: No/denies pain  ? ? ? ?Hand Dominance Right ?  ?Extremity/Trunk Assessment Upper Extremity Assessment ?Upper Extremity Assessment: Generalized weakness ?  ?Lower Extremity Assessment ?Lower Extremity Assessment: Generalized weakness ?  ?  ?  ?Communication Communication ?Communication: HOH ?  ?  Cognition Arousal/Alertness: Awake/alert ?Behavior During Therapy: [facility] for tasks assessed/performed ?Overall Cognitive Status: Within Functional Limits for tasks assessed ?  ?  ?  ?  ?  ?  ?  ?  ?  ?  ?  ?  ?  ?  ?  ?  ?  ?  ?  ?General Comments    ? ?  ?Exercises   ?  ?Shoulder Instructions    ? ? ?Home Living Family/patient expects to be discharged to:: Private residence ?Living Arrangements: Children ?Available Help at Discharge: Family;Personal care  attendant ?Type of Home: House ?Home Access: Stairs to enter ?Entrance Stairs-Number of Steps: 2 ?Entrance Stairs-Rails: None ?Home Layout: Two level;Able to live on main level with bedroom/bathroom ?  ?  ?Bathroom Shower/Tub: Walk-in shower ?  ?  ?  ?  ?Home Equipment: Agricultural consultant (2 wheels) ?  ?Additional Comments: Per OT "Pt reports she's ambulatory with RW but does endorse 3 falls in the past 6 months." ? Lives With: Family ? ?  ?Prior Functioning/Environment Prior Level of Function : Independent/Modified Independent;Needs assist ?  ?  ?  ?Physical Assist : ADLs (physical) ?  ?ADLs (physical): Bathing;Dressing;IADLs ?Mobility Comments: Ambulatory with RW ?ADLs Comments: Per OT "aide help 4x/wk for bathing/dressing and laundry. Family assist with IADLs. Pt reports needing a little more assist since recent admission but was able to use microwave to reheat foot." ?  ? ?  ?  ?OT Problem List: Decreased strength;Decreased activity tolerance;Impaired balance (sitting and/or standing);Decreased knowledge of use of DME or AE;Impaired vision/perception ?  ?   ?OT Treatment/Interventions: Self-care/ADL training;Therapeutic exercise;DME and/or AE instruction;Therapeutic activities;Neuromuscular education;Balance training  ?  ?OT Goals(Current goals can be found in the care plan section) Acute Rehab OT Goals ?Patient Stated Goal: go home ?OT Goal Formulation: With patient ?Time For Goal Achievement: 06/08/21 ?Potential to Achieve Goals: Good ?ADL Goals ?Pt Will Perform Eating: sitting;with set-up (set up with low vision strategies) ?Pt Will Transfer to Toilet: with min guard assist;stand pivot transfer;bedside commode (LRAD) ?Pt Will Perform Toileting - Clothing Manipulation and hygiene: with supervision;sitting/lateral leans;with set-up ?Additional ADL Goal #1: Pt will complete bed mobility with supervision and PRN VC for sequencing due to low vision deficits.  ?OT Frequency: Min 2X/week ?  ? ?Co-evaluation   ?  ?  ?  ?   ? ?  ?AM-PAC OT "6 Clicks" Daily Activity     ?Outcome Measure Help from another person eating meals?: None ?Help from another person taking care of personal grooming?: A Little ?Help from another person toileting, which includes using toliet, bedpan, or urinal?: A Little ?Help from another person bathing (including washing, rinsing, drying)?: A Little ?Help from another person to put on and taking off regular upper body clothing?: A Little ?Help from another person to put on and taking off regular lower body clothing?: A Little ?6 Click Score: 19 ?  ?End of Session   ? ?Activity Tolerance: Patient tolerated treatment well ?Patient left: in bed;with call bell/phone within reach;with bed alarm set ? ?OT Visit Diagnosis: Unsteadiness on feet (R26.81);Muscle weakness (generalized) (M62.81)  ?              ?Time: 1610-9604 ?OT Time Calculation (min): 25 min ?Charges:  OT General Charges ?$OT Visit: 1 Visit ?OT Evaluation ?$OT Eval Low Complexity: 1 Low ?OT Treatments ?$Self Care/Home Management : 8-22 mins ? ?Arman Filter., MPH, MS, OTR/L ?ascom [phone] ?05/25/21, 2:19 PM ?

## 2021-05-25 NOTE — Consult Note (Addendum)
? ?                                                                                ?Consultation Note ?Date: 05/25/2021  ? ?Patient Name: Judy Harvey  ?DOB: 03-01-1928  MRN: [id number]  Age / Sex: 88 y.o., female  ?PCP: Leonel Ramsay, MD ?Referring Physician: Lorella Nimrod, MD ? ?Reason for Consultation: Establishing goals of care ? ?HPI/Patient Profile: 88 y.o. female  with past medical history of stroke (05/14/2021), macular degeneration-legally blind, HTN, HLD, type 2 diabetes, COPD, GERD, depression, neuropathy, bilateral carotid artery stenosis, CKD (3A), former smoker, iron deficiency anemia admitted on 05/24/2021 with weakness and hypoglycemia.   ? ?Patient was recently treated for pneumonitis and CVA with hospitalization from 3/3 - 3/11.  ? ?Clinical Assessment and Goals of Care: ?I have reviewed medical records including EPIC notes, labs and imaging, assessed the patient and then met with patient at bedside to discuss diagnosis prognosis, GOC, EOL wishes, disposition and options. ? ?I introduced Palliative Medicine as specialized medical care for people living with serious illness. It focuses on providing relief from the symptoms and stress of a serious illness. The goal is to improve quality of life for both the patient and the family. ? ?We discussed a brief life review of the patient.  Patient was married for 71 years (husband died in 05/28/03).  She has 3 children [name], Remo Lipps, and [place]).  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 Once her macular degeneration worsened, she enjoys listening to books on tape that her granddaughter sends to her. She endorses staying in bed all day while at home.  ? ?As far as functional and nutritional status PTA patient says she knows how to walk and talk and eat but has been slowed down by her recent CVA. She endorses chronic nausea since Dec 2022. She reports her PCP gave her medication that helped it go away but she cannot recall name of  medicine at this time. Pt states she isn't hungry and feels nauseous after eating a few bites of anything. But, she says she knows she needs to eat and tries to sip on broth or take bites of food frequently throughout the day.  ? ?We discussed patient's current illness and what it means in the larger context of patient's on-going co-morbidities. I attempted to elicit values and goals of care important to the patient.  She says she wants to go home and stay at home. SHe does not ever want to go to a SNF/LTC. ? ?She shares she discouraged her son from calling EMS at her previous hospitalization since she is tired. She says she is at peace and ready to go whenever that time comes. Pt said she called EMS this time only to ask a few questions but they ended up sending an ambulance anyway. Pt denies suicidal or homicidal thoughts but says she is fine to "go on" whenever her time comes. ? ?Advance directives, concepts specific to code status, artificial feeding and hydration, and rehospitalization were considered and discussed. Pt was clear she would like to allow a natural death and does not want CPR or aggressive  means to sustain her life. DNR remains.  ?  ?Discussed with patient/family the importance of continued conversation with family and the medical providers regarding overall plan of care and treatment options, ensuring decisions are within the context of the patient?s values and GOCs.   ? ?Questions and concerns were addressed. The family was encouraged to call with questions or concerns.  ? ?Primary Decision Maker ?PATIENT ? ?Code Status/Advance Care Planning: ?DNR ? ?Prognosis:   ?Unable to determine ? ?Discharge Planning: Home with Home Health ? ?Primary Diagnoses: ?Present on Admission: ? Hypoglycemia ? Iron deficiency anemia ? Depression ? Hyperlipidemia ? COPD (chronic obstructive pulmonary disease) ([place]) ? Chronic kidney disease, stage 3a ([facility]) ? HTN (hypertension) ? Legally blind ? Pneumonitis ? Type II  diabetes mellitus with renal manifestations ([place]) ? Elevated troponin ? ? ?Physical Exam ?Vitals and nursing note reviewed.  ?Constitutional:   ?   General: She is not in acute distress. ?   Appearance: Normal appearance. She is not toxic-appearing.  ?HENT:  ?   Head: Normocephalic and atraumatic.  ?   Mouth/Throat:  ?   Mouth: Mucous membranes are moist.  ?Eyes:  ?   Pupils: Pupils are equal, round, and reactive to light.  ?Cardiovascular:  ?   Rate and Rhythm: Normal rate.  ?   Pulses: Normal pulses.  ?Pulmonary:  ?   Effort: Pulmonary effort is normal.  ?Abdominal:  ?   Palpations: Abdomen is soft.  ?Musculoskeletal:  ?   Comments: Generalized weakness  ?Skin: ?   General: Skin is warm and dry.  ?   Comments: Bilateral bruising of hands/wrists - appears to be from lab/blood draws  ?Neurological:  ?   Mental Status: She is alert and oriented to person, place, and time.  ?Psychiatric:     ?   Mood and Affect: Mood normal.     ?   Behavior: Behavior normal.     ?   Thought Content: Thought content normal.     ?   Judgment: Judgment normal.  ? ? ?Palliative Assessment/Data: 40% ? ? ? ? ?I discussed this patient's plan of care with patient, nurse tech. ? ?Thank you for this consult. Palliative medicine will continue to follow and assist holistically.  ? ?Time Total: 75 minutes ?Greater than 50%  of this time was spent counseling and coordinating care related to the above assessment and plan. ? ?Signed by: ?Jordan Hawks, DNP, FNP-BC ?Palliative Medicine ? ?  ?Please contact Palliative Medicine Team phone at [phone] for questions and concerns.  ?For individual provider: See Amion ? ? ? ? ? ? ? ? ? ? ? ? ?  ?

## 2021-05-25 NOTE — Discharge Summary (Signed)
?Physician Discharge Summary ?  ?Patient: Judy Harvey MRN: [id number] DOB: Jul 23, 1927  ?Admit date:     05/24/2021  ?Discharge date: 05/25/21  ?Discharge Physician: Lorella Nimrod  ? ?PCP: Leonel Ramsay, MD  ? ?Recommendations at discharge:  ?Please monitor blood glucose level closely as we decrease the dose of Lantus. ?Avoid hypoglycemia. ? ?Discharge Diagnoses: ?Principal Problem: ?  Hypoglycemia ?Active Problems: ?  Pneumonitis ?  Elevated troponin ?  Generalized weakness ?  Recent cerebrovascular accident 05/14/21 (CVA) ?  Iron deficiency anemia ?  Legally blind ?  Depression ?  Hyperlipidemia ?  COPD (chronic obstructive pulmonary disease) ([place]) ?  Chronic kidney disease, stage 3a ([place]) ?  HTN (hypertension) ?  Type II diabetes mellitus with renal manifestations ([place]) ? ? ?Hospital Course: ? Judy Harvey is a 88 y.o. female with medical history significant of hypertension, hyperlipidemia, diabetes mellitus, COPD, recent stroke 3/70/23, GERD, depression, anemia, neuropathy, legally blind, RLS, bilateral carotid artery stenosis, PVC, PSVT, CKD-3A, who presents with weakness. ?  ?  ?Patient stated that she has gone through a lot.  She had stroke on 05/14/2021.  She was admitted to hospital again 3/7 - 3/17 due to generalized weakness and pneumonitis.  She was discharged on Augmentin, supposed to take it from 3/11 - 3/13.  She states that she has not completely recovered yet.  She has poor appetite and decreased oral intake.  She feels very weak last night.  Per EMS report, patient was found to have hypoglycemia with sugar down to 29 and treated with D10 and D50.  ?Blood glucose level remained stable since then.  We decreased the home dose of Lantus to 10 units at bedtime and she needs a close follow-up with PCP for further management.  Mild hyperglycemia will be better than hypoglycemia in her case. ? ?Patient wants to stay at home despite having multiple hospitalizations recently.  We also consulted  palliative care and she can continue with them as an outpatient.  She will resume her home health services on discharge. ? ?Patient will continue the rest of her home medications and follow-up with her providers. ? ? ?Consultants: None ?Procedures performed: None ?Disposition: Home health ?Diet recommendation:  ?Discharge Diet Orders (From admission, onward)  ? ?  Start     Ordered  ? 05/25/21 0000  Diet - low sodium heart healthy       ? 05/25/21 1355  ? ?  ?  ? ?  ? ?Cardiac and Carb modified diet ?DISCHARGE MEDICATION: ?Allergies as of 05/25/2021   ? ?   Reactions  ? Adhesive [tape] Other (See Comments)  ? "pulls my skin off" paper tape ok  ? Lyrica [pregabalin]   ? Body spasms  ? ?  ? ?  ?Medication List  ?  ? ?STOP taking these medications   ? ?amoxicillin-clavulanate 500-125 MG tablet ?Commonly known as: AUGMENTIN ?  ? ?  ? ?TAKE these medications   ? ?acetaminophen 325 MG tablet ?Commonly known as: TYLENOL ?Take 650 mg by mouth every 6 (six) hours as needed for mild pain or fever. ?  ?albuterol 108 (90 Base) MCG/ACT inhaler ?Commonly known as: VENTOLIN HFA ?Inhale 2 puffs into the lungs every 6 (six) hours as needed for shortness of breath or wheezing. ?  ?albuterol 1.25 MG/3ML nebulizer solution ?Commonly known as: ACCUNEB ?Take 3 mLs by nebulization every 6 (six) hours as needed for wheezing or shortness of breath. ?  ?aspirin EC 81 MG tablet ?Take  81 mg by mouth daily. ?  ?atorvastatin 80 MG tablet ?Commonly known as: LIPITOR ?Take 1 tablet (80 mg total) by mouth at bedtime. ?  ?clopidogrel 75 MG tablet ?Commonly known as: PLAVIX ?Take 75 mg by mouth daily. ?  ?DULoxetine 30 MG capsule ?Commonly known as: CYMBALTA ?TAKE 1 CAPSULE BY MOUTH EVERY DAY ?What changed:  ?how much to take ?how to take this ?when to take this ?additional instructions ?  ?ferrous sulfate 325 (65 FE) MG tablet ?Take 325 mg by mouth daily. ?  ?gabapentin 300 MG capsule ?Commonly known as: NEURONTIN ?Take 300 mg by mouth daily. ?   ?insulin glargine 100 UNIT/ML injection ?Commonly known as: LANTUS ?Inject 0.1 mLs (10 Units total) into the skin at bedtime. ?What changed: how much to take ?  ?Melatonin 3 MG Caps ?Take 3 mg by mouth at bedtime. ?  ?metFORMIN 500 MG 24 hr tablet ?Commonly known as: GLUCOPHAGE-XR ?Take 500 mg by mouth 2 (two) times daily. ?  ?metoprolol tartrate 25 MG tablet ?Commonly known as: LOPRESSOR ?Take 0.5 tablets (12.5 mg total) by mouth 2 (two) times daily. ?  ?ondansetron 8 MG tablet ?Commonly known as: ZOFRAN ?Take 8 mg by mouth every 8 (eight) hours as needed for nausea or vomiting. ?  ?pantoprazole 40 MG tablet ?Commonly known as: PROTONIX ?Take 40 mg by mouth 2 (two) times daily. ?  ?polyethylene glycol 17 g packet ?Commonly known as: MIRALAX / GLYCOLAX ?Take 17 g by mouth daily. ?  ?traZODone 50 MG tablet ?Commonly known as: DESYREL ?Take 50 mg by mouth at bedtime. ?  ?Vitamin D3 125 MCG (5000 UT) Caps ?Take 1 capsule (5,000 Units total) by mouth daily. ?  ? ?  ? ? Follow-up Information   ? ? Leonel Ramsay, MD. Schedule an appointment as soon as possible for a visit in 1 week(s).   ?Specialty: Infectious Diseases ?Contact information: ?[address] ?[place] [address] ?[phone] ? ? ?  ?  ? ?  ?  ? ?  ? ?Discharge Exam: ?Filed Weights  ? 05/24/21 0700  ?Weight: 79.4 kg  ? ?General.     In no acute distress. ?Pulmonary.  Lungs clear bilaterally, normal respiratory effort. ?CV.  Regular rate and rhythm, no JVD, rub or murmur. ?Abdomen.  Soft, nontender, nondistended, BS positive. ?CNS.  Alert and oriented .  No focal neurologic deficit. ?Extremities.  No edema, no cyanosis, pulses intact and symmetrical. ?Psychiatry.  Judgment and insight appears normal.  ? ?Condition at discharge: stable ? ?The results of significant diagnostics from this hospitalization (including imaging, microbiology, ancillary and laboratory) are listed below for reference.  ? ?Imaging Studies: ?CT ANGIO HEAD NECK W WO  CM ? ?Result Date: 05/15/2021 ?CLINICAL DATA:  Provided history: Neuro deficit, acute, stroke suspected. Further eval of ACA stenosis seen on MRA. Numbness. EXAM: CT ANGIOGRAPHY HEAD AND NECK TECHNIQUE: Multidetector CT imaging of the head and neck was performed using the standard protocol during bolus administration of intravenous contrast. Multiplanar CT image reconstructions and MIPs were obtained to evaluate the vascular anatomy. Carotid stenosis measurements (when applicable) are obtained utilizing NASCET criteria, using the distal internal carotid diameter as the denominator. RADIATION DOSE REDUCTION: This exam was performed according to the departmental dose-optimization program which includes automated exposure control, adjustment of the mA and/or kV according to patient size and/or use of iterative reconstruction technique. CONTRAST:  71mL OMNIPAQUE IOHEXOL 350 MG/ML SOLN COMPARISON:  MRA head and MRI brain 05/14/2021. FINDINGS: CT HEAD  FINDINGS Brain: Mild generalized cerebral and cerebellar atrophy. A small acute/early subacute infarct within the right parietal lobe was better appreciated on the brain MRI of 05/14/2021. Redemonstrated small chronic cortical/subcortical infarct within the right parietal lobe (for instance as seen on series 4, image 24). Background moderate/advanced patchy and ill-defined hypoattenuation within the cerebral white matter, nonspecific but compatible with chronic small vessel ischemic disease. Known chronic lacunar infarct within the left thalamus, better appreciated on the prior MRI. There is no acute intracranial hemorrhage. No extra-axial fluid collection. No evidence of an intracranial mass. No midline shift. Vascular: No hyperdense vessel.  Atherosclerotic calcifications. Skull: Normal. Negative for fracture or focal lesion. Sinuses: Partial opacification of a posterior right ethmoid air cell. Orbits: No orbital mass or acute orbital finding. Review of the MIP images  confirms the above findings CTA NECK FINDINGS Aortic arch: Standard aortic branching. Atherosclerotic plaque within the visualized aortic arch and proximal major branch vessels of the neck. No hemodynamically significant

## 2021-05-25 NOTE — TOC Progression Note (Signed)
Transition of Care (TOC) - Progression Note  ? ? ?Patient Details  ?Name: [name] ?MRN: [id number] ?Date of Birth: 12-02-1927 ? ?Transition of Care (TOC) CM/SW Contact  ?Davianna Deutschman A Meda Dudzinski, LCSW ?Phone Number: ?05/25/2021, 2:36 PM ? ?Clinical Narrative:   CSW spoke with [facility] and they are aware pt is dc today. Orders are in. Orders faxed to [facility] [phone]) ? ? ? ?Expected Discharge Plan: [place] ?Barriers to Discharge: No Barriers Identified ? ?Expected Discharge Plan and Services ?Expected Discharge Plan: [place] ?In-house Referral: Clinical Social Work ?  ?Post Acute Care Choice: Home Health ?Living arrangements for the past 2 months: [place] ?Expected Discharge Date: 05/25/21               ?  ?  ?  ?  ?  ?  ?  ?  ?  ?  ? ? ?Social Determinants of Health (SDOH) Interventions ?  ? ?Readmission Risk Interventions ?No flowsheet data found. ? ?

## 2021-05-25 NOTE — Progress Notes (Addendum)
Date and time results received:  05/24/2021 2254 ? ?Test: Troponin ?Critical Value: 127 ? ?Name of Provider Notified: Gonzella Lex, NP ? ?Orders Received? Or Actions Taken?: New order placed by on call provider to trend troponin until it peaks. Pt asymptomatic; denies pain/CP. ?

## 2021-05-25 NOTE — Evaluation (Signed)
Physical Therapy Evaluation ?Patient Details ?Name: Judy Harvey ?MRN: [id number] ?DOB: Jul 07, 1927 ?Today's Date: 05/25/2021 ? ?History of Present Illness ? Pt is a 88 y.o. female presenting to hospital 3/13 with c/o weakness (unable to get OOB on own); glucose 40 on EMS arrival.  Pt admitted with hypoglycemia, DM with renal manifestations, recent pneumonitis, generalized weakness, recent CVA 05/14/21, and iron deficiency anemia.  PMH includes DM, legal blindness, COPD, htn, CVA, neuropathy, RLS, PVC, PSVT, CKD.  Recent stroke 05/14/2021 and hospitalization 3/7-3/11 for generalized weakness and pneumonitis.  ?Clinical Impression ? Prior to hospital admission, pt was modified independent ambulating with RW within home; lives with family; 2 L home O2 use.  Currently pt is SBA with bed mobility; CGA with transfers using RW; and CGA ambulating 40 feet with RW (limited distance ambulating d/t pt needing to toilet).  Occasional additional assist required d/t pt's impaired vision in unfamiliar environment.  Pt would benefit from skilled PT to address noted impairments and functional limitations (see below for any additional details).  Upon hospital discharge, pt would benefit from HHPT.    ? ?Recommendations for follow up therapy are one component of a multi-disciplinary discharge planning process, led by the attending physician.  Recommendations may be updated based on patient status, additional functional criteria and insurance authorization. ? ?Follow Up Recommendations Home health PT ? ?  ?Assistance Recommended at Discharge Frequent or constant Supervision/Assistance  ?Patient can return home with the following ? A little help with walking and/or transfers;A little help with bathing/dressing/bathroom;Assistance with cooking/housework;Assistance with feeding;Direct supervision/assist for medications management;Direct supervision/assist for financial management;Assist for transportation;Help with stairs or ramp for  entrance ? ?  ?Equipment Recommendations Rolling walker (2 wheels)  ?Recommendations for Other Services ?    ?  ?Functional Status Assessment Patient has had a recent decline in their functional status and demonstrates the ability to make significant improvements in function in a reasonable and predictable amount of time.  ? ?  ?Precautions / Restrictions Precautions ?Precautions: Fall ?Precaution Comments: legally blind; HOH ?Restrictions ?Weight Bearing Restrictions: No  ? ?  ? ?Mobility ? Bed Mobility ?Overal bed mobility: Needs Assistance ?Bed Mobility: Supine to Sit, Sit to Supine ?  ?  ?Supine to sit: Supervision, HOB elevated ?Sit to supine: Supervision, HOB elevated ?  ?General bed mobility comments: mild increased effort to perform on own ?  ? ?Transfers ?Overall transfer level: Needs assistance ?Equipment used: Rolling walker (2 wheels) ?Transfers: Sit to/from Stand, Bed to chair/wheelchair/BSC ?Sit to Stand: Min guard ?  ?Step pivot transfers: Min guard (stand step turn bed to/from [facility] with RW use) ?  ?  ?  ?General transfer comment: x2 trials from bed and x1 trial from [facility] ?  ? ?Ambulation/Gait ?Ambulation/Gait assistance: Min guard ?Gait Distance (Feet): 40 Feet (limited distance ambulating d/t pt needing to toilet) ?Assistive device: Rolling walker (2 wheels) ?  ?Gait velocity: decreased ?  ?  ?General Gait Details: partial step through gait pattern; steady with RW use; PT managing O2 lines for safety ? ?Stairs ?  ?  ?  ?  ?  ? ?Wheelchair Mobility ?  ? ?Modified Rankin (Stroke Patients Only) ?  ? ?  ? ?Balance Overall balance assessment: Needs assistance ?Sitting-balance support: No upper extremity supported, Feet supported ?Sitting balance-Leahy Scale: Good ?Sitting balance - Comments: steady sitting reaching within BOS ?  ?Standing balance support: Bilateral upper extremity supported, During functional activity ?Standing balance-Leahy Scale: Fair ?Standing balance comment: no loss of balance  ambulating with RW use ?  ?  ?  ?  ?  ?  ?  ?  ?  ?  ?  ?   ? ? ? ?Pertinent Vitals/Pain Pain Assessment ?Pain Assessment: No/denies pain ?Vitals (HR and O2 on 2 L via nasal cannula) stable and WFL throughout treatment session.  ? ? ?Home Living Family/patient expects to be discharged to:: Private residence ?Living Arrangements: Children (pt's son and girlfriend) ?Available Help at Discharge: Family;Personal care attendant ?Type of Home: House ?Home Access: Stairs to enter ?Entrance Stairs-Rails: None ?Entrance Stairs-Number of Steps: 2 ?Alternate Level Stairs-Number of Steps: 2 ?Home Layout: Two level;Able to live on main level with bedroom/bathroom ?Home Equipment: Agricultural consultant (2 wheels) ?   ?  ?Prior Function Prior Level of Function : Independent/Modified Independent;Needs assist ?  ?  ?  ?Physical Assist : ADLs (physical) ?  ?ADLs (physical): Bathing;Dressing;IADLs ?Mobility Comments: Ambulatory with RW ?ADLs Comments: Has an aide 4x/week for 4 hours/day for ADL's ?  ? ? ?Hand Dominance  ? Dominant Hand: Right ? ?  ?Extremity/Trunk Assessment  ? Upper Extremity Assessment ?Upper Extremity Assessment: Generalized weakness ?  ? ?Lower Extremity Assessment ?Lower Extremity Assessment: Generalized weakness ?  ? ?Cervical / Trunk Assessment ?Cervical / Trunk Assessment: Other exceptions ?Cervical / Trunk Exceptions: forward head/shoulders  ?Communication  ? Communication: HOH  ?Cognition Arousal/Alertness: Awake/alert ?Behavior During Therapy: [place] for tasks assessed/performed ?Overall Cognitive Status: Within Functional Limits for tasks assessed ?  ?  ?  ?  ?  ?  ?  ?  ?  ?  ?  ?  ?  ?  ?  ?  ?  ?  ?  ? ?  ?General Comments  Pt agreeable to PT session. ? ?  ?Exercises  Transfers and ambulation  ? ?Assessment/Plan  ?  ?PT Assessment Patient needs continued PT services  ?PT Problem List Decreased strength;Decreased activity tolerance;Decreased balance;Decreased mobility ? ?   ?  ?PT Treatment Interventions DME  instruction;Gait training;Stair training;Functional mobility training;Therapeutic activities;Therapeutic exercise;Balance training;Patient/family education   ? ?PT Goals (Current goals can be found in the Care Plan section)  ?Acute Rehab PT Goals ?Patient Stated Goal: to improve strength and mobility ?PT Goal Formulation: With patient ?Time For Goal Achievement: 06/08/21 ?Potential to Achieve Goals: Good ? ?  ?Frequency Min 2X/week ?  ? ? ?Co-evaluation   ?  ?  ?  ?  ? ? ?  ?AM-PAC PT "6 Clicks" Mobility  ?Outcome Measure Help needed turning from your back to your side while in a flat bed without using bedrails?: None ?Help needed moving from lying on your back to sitting on the side of a flat bed without using bedrails?: A Little ?Help needed moving to and from a bed to a chair (including a wheelchair)?: A Little ?Help needed standing up from a chair using your arms (e.g., wheelchair or bedside chair)?: A Little ?Help needed to walk in hospital room?: A Little ?Help needed climbing 3-5 steps with a railing? : A Lot ?6 Click Score: 18 ? ?  ?End of Session Equipment Utilized During Treatment: Gait belt;Oxygen ?Activity Tolerance: Patient tolerated treatment well ?Patient left: in bed;with call bell/phone within reach;with bed alarm set ?Nurse Communication: Mobility status;Precautions (via white board) ?PT Visit Diagnosis: Muscle weakness (generalized) (M62.81);History of falling (Z91.81);Other abnormalities of gait and mobility (R26.89) ?  ? ?Time: 4166-0630 ?PT Time Calculation (min) (ACUTE ONLY): 24 min ? ? ?Charges:   PT Evaluation ?$PT Eval Low  Complexity: 1 Low ?PT Treatments ?$Therapeutic Activity: 8-22 mins ?  ?   ? ?Hendricks LimesEmily Niguel Moure, PT ?05/25/21, 6:21 PM ? ? ?

## 2021-05-25 NOTE — TOC Initial Note (Signed)
Transition of Care (TOC) - Initial/Assessment Note  ? ? ?Patient Details  ?Name: Judy Harvey ?MRN: [id number] ?Date of Birth: [birth year] ? ?Transition of Care (TOC) CM/SW Contact:    ?Lambros Cerro A Shaketta Rill, LCSW ?Phone Number: ?05/25/2021, 10:59 AM ? ?Clinical Narrative:    CSW spoke with pt at bedside and pt states she lives at home and was getting services prior to admission. Pt states she will NOT go to SNF. Pt ask that CSW contact her son for more information. ?CSW spoke with pt's son and he states that pt lives with him. Pt's son confirmed pt has [place] through [facility]. Pt's son confirmed pt has 02, potty chair, and walker at home. Pt's son could not think of any other DME pt would need at home. Pt's son is aware pt will potentially dc today and states he will pick pt up at d/c.  ? ?CSW to reach out to [facility] to notify resumption of services.              ? ? ?Expected Discharge Plan: [place] ?Barriers to Discharge: No Barriers Identified ? ? ?Patient Goals and CMS Choice ?Patient states their goals for this hospitalization and ongoing recovery are:: to return home ?  ?  ? ?Expected Discharge Plan and Services ?Expected Discharge Plan: [place] ?In-house Referral: Clinical Social Work ?  ?Post Acute Care Choice: Home Health ?Living arrangements for the past 2 months: [place] ?                ?  ?  ?  ?  ?  ?  ?  ?  ?  ?  ? ?Prior Living Arrangements/Services ?Living arrangements for the past 2 months: [place] ?Lives with:: Adult Children ?Patient language and need for interpreter reviewed:: Yes ?Do you feel safe going back to the place where you live?: Yes      ?Need for Family Participation in Patient Care: Yes (Comment) ?Care giver support system in place?: Yes (comment) ?  ?Criminal Activity/Legal Involvement Pertinent to Current Situation/Hospitalization: No - Comment as needed ? ?Activities of Daily Living ?Home Assistive  Devices/Equipment: Hearing aid, Gilford Rile (specify type), Wheelchair ?ADL Screening (condition at time of admission) ?Patient's cognitive ability adequate to safely complete daily activities?: Yes ?Is the patient deaf or have difficulty hearing?: Yes ?Does the patient have difficulty seeing, even when wearing glasses/contacts?: No ?Does the patient have difficulty concentrating, remembering, or making decisions?: Yes ?Patient able to express need for assistance with ADLs?: Yes ?Does the patient have difficulty dressing or bathing?: Yes ?Independently performs ADLs?: No ?Communication: Independent ?Dressing (OT): Needs assistance ?Is this a change from baseline?: Pre-admission baseline ?Grooming: Needs assistance ?Is this a change from baseline?: Pre-admission baseline ?Feeding: Independent with device (comment) ?Is this a change from baseline?: Pre-admission baseline ?Bathing: Needs assistance ?Is this a change from baseline?: Pre-admission baseline ?Toileting: Needs assistance ?Is this a change from baseline?: Pre-admission baseline ?In/Out Bed: Needs assistance ?Is this a change from baseline?: Pre-admission baseline ?Walks in Home: Independent with device (comment) ?Does the patient have difficulty walking or climbing stairs?: Yes ?Weakness of Legs: Both ?Weakness of Arms/Hands: None ? ?Permission Sought/Granted ?Permission sought to share information with : Family Supports ?Permission granted to share information with : Yes, Verbal Permission Granted ? Share Information with NAME: [name] ? Permission granted to share info w AGENCY: [place] ? Permission granted to share info w  Relationship: son ?   ? ?Emotional Assessment ?Appearance:: Appears stated age ?Attitude/Demeanor/Rapport: Engaged ?Affect (typically observed): Accepting ?Orientation: : Oriented to Self, Oriented to Place, Oriented to  Time, Oriented to Situation ?Alcohol / Substance Use: Not Applicable ?Psych Involvement: No  (comment) ? ?Admission diagnosis:  Hypoglycemia [E16.2] ?Patient Active Problem List  ? Diagnosis Date Noted  ? Hypoglycemia 05/24/2021  ? Chronic kidney disease, stage 3a ([place]) 05/24/2021  ? HTN (hypertension) 05/24/2021  ? Type II diabetes mellitus with renal manifestations ([place]) 05/24/2021  ? Generalized weakness 05/19/2021  ? Elevated troponin 05/19/2021  ? Chronic respiratory failure with hypoxia ([place]) 05/19/2021  ? COPD (chronic obstructive pulmonary disease) ([place]) 05/19/2021  ? Acute CVA (cerebrovascular accident) ([place]) 05/15/2021  ? Left sided numbness 05/14/2021  ? ARF (acute renal failure) ([place]) 03/12/2021  ? Hypotension due to hypovolemia   ? Acute respiratory failure with hypoxia ([place])   ? Pneumonitis 10/25/2020  ? Major depressive 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 ([place]) 08/21/2018  ? Low vitamin B12 level 04/26/2018  ? Iron deficiency anemia 03/28/2018  ? Frequent PVCs 03/21/2018  ? PSVT (paroxysmal supraventricular tachycardia) ([place]) 03/21/2018  ? Normocytic anemia 02/28/2018  ? Benign essential HTN 02/20/2018  ? SOBOE (shortness of breath on exertion) 02/20/2018  ? Carotid stenosis, symptomatic w/o infarct, left 10/18/2017  ? TIA (transient ischemic attack) 06/16/2017  ? Bilateral carotid artery stenosis 06/16/2017  ? Hyperlipidemia 06/16/2017  ? Obesity (BMI 30.0-34.9) 05/02/2016  ? Vitamin D deficiency 03/25/2016  ? Type 2 diabetes mellitus without complication, with long-term current use of insulin ([place]) 03/24/2016  ? Recent cerebrovascular accident 05/14/21 (CVA) 03/24/2016  ? Legally blind 03/24/2016  ? Macular degeneration 03/24/2016  ? Constipation 03/24/2016  ? History of esophageal stricture 03/24/2016  ? Depression 03/24/2016  ? Restless leg syndrome 03/24/2016  ? Postherpetic neuralgia 03/24/2016  ? Gait abnormality 03/24/2016  ? ?PCP:  Leonel Ramsay, MD ?Pharmacy:   ?[id number] - [place], [place] ?[address] [place] [address] ?Phone: [phone] Fax:  [phone] ? ? ? ? ?Social Determinants of Health (SDOH) Interventions ?  ? ?Readmission Risk Interventions ?No flowsheet data found. ? ? ?

## 2021-05-25 NOTE — Care Management Obs Status (Signed)
MEDICARE OBSERVATION STATUS NOTIFICATION ? ? ?Patient Details  ?Name: Judy Harvey ?MRN: [id number] ?Date of Birth: 23-Aug-1927 ? ? ?Medicare Observation Status Notification Given:  Yes ? ? ? ?Mardell Suttles A Romona Murdy, LCSW ?05/25/2021, 10:45 AM ?

## 2021-05-26 ENCOUNTER — Inpatient Hospital Stay: Payer: Medicare Other

## 2021-05-28 ENCOUNTER — Inpatient Hospital Stay: Payer: Medicare Other

## 2021-06-23 ENCOUNTER — Telehealth: Payer: Self-pay | Admitting: Oncology

## 2021-06-23 NOTE — Telephone Encounter (Signed)
Son Judy Harvey stating that pt in the hospital, needs to cancell appt. Will call back to resch.Cherylann Banas  ?

## 2021-06-24 ENCOUNTER — Other Ambulatory Visit: Payer: Medicare Other | Admitting: Primary Care

## 2021-06-28 ENCOUNTER — Non-Acute Institutional Stay: Payer: Medicare Other | Admitting: Primary Care

## 2021-06-28 ENCOUNTER — Other Ambulatory Visit: Payer: Medicare Other

## 2021-06-28 DIAGNOSIS — Z515 Encounter for palliative care: Secondary | ICD-10-CM

## 2021-06-28 DIAGNOSIS — J9611 Chronic respiratory failure with hypoxia: Secondary | ICD-10-CM

## 2021-06-28 DIAGNOSIS — Z8673 Personal history of transient ischemic attack (TIA), and cerebral infarction without residual deficits: Secondary | ICD-10-CM

## 2021-06-28 DIAGNOSIS — N1831 Chronic kidney disease, stage 3a: Secondary | ICD-10-CM

## 2021-06-28 DIAGNOSIS — R531 Weakness: Secondary | ICD-10-CM

## 2021-06-28 DIAGNOSIS — Z794 Long term (current) use of insulin: Secondary | ICD-10-CM

## 2021-06-28 DIAGNOSIS — E119 Type 2 diabetes mellitus without complications: Secondary | ICD-10-CM

## 2021-06-28 NOTE — Progress Notes (Signed)
? ? ?Manufacturing engineer ?Community Palliative Care Consult Note ?Telephone: [phone]  ?Fax: [phone]  ? ? ?Date of encounter: 06/28/21 ?3:06 PM ?PATIENT NAME: Judy Harvey ?[place] [place] [address]   ?[phone] (home)  ?DOB: August 14, 1927 ?MRN: [id number] ?PRIMARY CARE PROVIDER:    ?Leonel Ramsay, MD,  ?@PCPADD @ ?[phone] ? ?REFERRING PROVIDER:   ?Leonel Ramsay, MD ?[address] ?[place],  [place] [address] ?[phone] ? ?RESPONSIBLE PARTY:    ?Contact Information   ? ? Name Relation Home Work Mobile  ? [place], [place] Son [phone]  [phone]  ? ?  ? ? ? ?I met face to face with patient in [facility]. Palliative Care was asked to follow this patient by consultation request of  Leonel Ramsay, MD to address advance care planning and complex medical decision making. This is a follow up visit. ? ?                                 ASSESSMENT AND PLAN / RECOMMENDATIONS:  ? ?Advance Care Planning/Goals of Care: Goals include to maximize quality of life and symptom management. Patient/health care surrogate gave his/her permission to discuss.Our advance care planning conversation included a discussion about:    ? ?Experiences with loved ones who have been seriously ill or have died  ?Exploration of personal, cultural or spiritual beliefs that might influence medical decisions  ?Exploration of goals of care in the event of a sudden injury or illness  ?Identification of a healthcare agent - son [name] ?Review of an  advance directive document . ?CODE STATUS: DNR ? ?I reviewed a MOST form today. The patient and family outlined their wishes for the following treatment decisions: ? ?Cardiopulmonary Resuscitation: Do Not Attempt Resuscitation (DNR/No CPR)  ?Medical Interventions: Comfort Measures: Keep clean, warm, and dry. Use medication by any route, positioning, wound care, and other measures to relieve pain and suffering. Use oxygen, suction and manual  treatment of airway obstruction as needed for comfort. Do not transfer to the hospital unless comfort needs cannot be met in current location.  ?Antibiotics: Determine use of limitation of antibiotics when infection occurs  ?IV Fluids: IV fluids for a defined trial period  ?Feeding Tube: No feeding tube  ? ? ?Symptom Management/Plan: ? ?Mobility:  OOB turning to get to chair/bed with assistance. Is able to ambulate but blind and therefore unsafe. [place] and sustained serious head trauma, prior to coming to SNF. ? ?Intake:  Pt reports fair to good intake. Eating  50-75%, weight is 189 lbs. Recent albumin however was 2.8.  ? ?Mood: Endorses frustration at blindness. I put on some music on her phone at her request. She cannot see to do this. Pls send activities to help with some adaptative devices, OT could benefit too.  ? ?Care Needs: Discussed with SNF LPN RE needing to have items within her reach, needing services for the blind, activities for blind and shampoo to wash out residual blood from fall. ? ?Follow up Palliative Care Visit: Palliative care will continue to follow for complex medical decision making, advance care planning, and clarification of goals. Return 1-2 weeks or prn. ? ?I spent 35 minutes providing this consultation. More than 50% of the time in this consultation was spent in counseling and care coordination. ? ?PPS: 40% ? ?HOSPICE ELIGIBILITY/DIAGNOSIS: TBD ? ?Chief Complaint: debility, immobility ? ?HISTORY OF PRESENT ILLNESS:  Judy Harvey is a  88 y.o. year old female  with chronic resp failure, DM insulin dependent,  self care deficits due to blindness, fall risk, recent traumatic head injury/laceration to face.Was admitted at home on hospice, and fell. Went to ED and revoked hospice to pursue rehab . Patient seen today to review palliative care needs to include medical decision making and advance care planning as appropriate.  ? ?History obtained from review of EMR, discussion with primary  team, and interview with family, facility staff/caregiver and/or Judy Harvey.  ?I reviewed available labs, medications, imaging, studies and related documents from the EMR.  Records reviewed and summarized above.  ? ?ROS ? ? ?General: NAD ?EYES: denies vision changes , endorses blindness ?ENMT: denies dysphagia ?Cardiovascular: denies chest pain, denies DOE ?Pulmonary: denies cough, denies increased SOB ?Abdomen: endorses good appetite, denies constipation, endorses continence of bowel ?GU: denies dysuria, endorses continence of urine ?MSK:  endorses  increased weakness,  no falls reported at snf ?Skin: denies rashes , endorses facial wounds ?Neurological: denies pain, denies insomnia ?Psych: Endorses positive mood ? ?Physical Exam: ?Current and past weights: 189 lbs ?Constitutional: NAD ?General: frail appearing ?EYES: anicteric sclera, lids intact, no discharge , blindness ?ENMT: intact hearing, oral mucous membranes moist, dentition intact ?CV: S1S2, RRR, no LE edema ?Pulmonary: LCTA, no increased work of breathing, no cough, supplemental oxygen ?Abdomen: intake 75%, soft and non tender, no ascites ?MSK: + sarcopenia, moves all extremities, ambulatory with help ?Skin: warm and dry, no rashes on visible skin, Forehead laceration healing ?Neuro:  +generalized weakness,  no cognitive impairment, non-anxious affect ? ? ?Thank you for the opportunity to participate in the care of Judy Harvey.  The palliative care team will continue to follow. Please call our office at [phone] if we can be of additional assistance.  ? ?Jason Coop, NP DNP, AGPCNP-BC ? ?COVID-19 PATIENT SCREENING TOOL ?Asked and negative response unless otherwise noted:  ? ?Have you had symptoms of covid, tested positive or been in contact with someone with symptoms/positive test in the past 5-10 days?  ? ?

## 2021-06-29 ENCOUNTER — Ambulatory Visit: Payer: Medicare Other | Admitting: Oncology

## 2021-06-29 ENCOUNTER — Ambulatory Visit: Payer: Medicare Other

## 2021-07-22 ENCOUNTER — Telehealth: Payer: Self-pay | Admitting: Primary Care

## 2021-07-22 NOTE — Telephone Encounter (Signed)
Attempted to contact patient's son [name], to schedule Palliative Consult, no answer and unable to leave a message due to mailbox was full. ? ?I then called and spoke with patient and discussed the Palliative referral/services and all questions were answered and she was in agreement with Palliative services in the home.  I have scheduled an In-home Consult for 08/03/21 @ 9 AM. ?

## 2021-08-03 ENCOUNTER — Other Ambulatory Visit: Payer: Medicare Other | Admitting: Primary Care

## 2021-08-03 VITALS — BP 142/80 | HR 83 | Temp 97.4°F | Resp 22

## 2021-08-03 DIAGNOSIS — R531 Weakness: Secondary | ICD-10-CM

## 2021-08-03 DIAGNOSIS — E1122 Type 2 diabetes mellitus with diabetic chronic kidney disease: Secondary | ICD-10-CM

## 2021-08-03 DIAGNOSIS — N1831 Chronic kidney disease, stage 3a: Secondary | ICD-10-CM

## 2021-08-03 DIAGNOSIS — J449 Chronic obstructive pulmonary disease, unspecified: Secondary | ICD-10-CM

## 2021-08-03 DIAGNOSIS — Z515 Encounter for palliative care: Secondary | ICD-10-CM

## 2021-08-03 NOTE — Progress Notes (Signed)
[facility] Consult Note Telephone: [phone]  Fax: [phone]    Date of encounter: 08/03/21 9:02 AM PATIENT NAME: Judy Harvey [address] [place] [address]   [phone] (home)  DOB: 08/19/1927 MRN: [id number] PRIMARY CARE PROVIDER:    Leonel Ramsay, MD,  [place] [address] [phone]  REFERRING PROVIDER:   Leonel Ramsay, MD [place],  [place] [address] [phone]  RESPONSIBLE PARTY:    Contact Information     Name Relation Home Work [place] Son [phone]  [phone]       I connected with  Judy Harvey on 08/03/21 by a video enabled telemedicine application and verified that I am speaking with the correct person using two identifiers.   I discussed the limitations of evaluation and management by telemedicine. The patient expressed understanding and agreed to proceed.   I met face to face with patient and family in home connecting virtually with Judy Gather, RN. Palliative Care was asked to follow this patient by consultation request of  Leonel Ramsay, MD to address advance care planning and complex medical decision making. This is a follow up visit.                                   ASSESSMENT AND PLAN / RECOMMENDATIONS:   Advance Care Planning/Goals of Care: Goals include to maximize quality of life and symptom management. Patient/health care surrogate gave his/her permission to discuss. Our advance care planning conversation included a discussion about:    The value and importance of advance care planning  Exploration of personal, cultural or spiritual beliefs that might influence medical decisions  Exploration of goals of care in the event of a sudden injury or illness  Identification of a healthcare agent- Judy Harvey and Judy Harvey Review and creation of an  advance directive document . Decision for Do Not Rescuitate,  patient initially states she would like to try if they could guarantee a good outcome. CPR was discussed, with risks of not knowing outcome discussed. Pt then decided for DNR, no CPR. Left document in home. CODE STATUS:  DNR  Symptom Management/Plan:   Glucose management: I met with patient by video with RN in the home. We discussed her glucose levels. Nurse was able to find the glucose meter in the home which still has the strips from 2018. At this point I would just advocate obtaining a whole new set up. The machine may not even be calibrated and the strips could also potentially not be manufactured anymore. Encourage son to purchase a newer glucometer kit.    Meanwhile, due to A1c of 5.1 in March and 5.3 in April I discontinued her two units of Lantus insulin. Son insisted this was the current dose. According to the medication list at one point she had been on Lantus with a sliding scale at meal times.  I asked him to follow up with primary to make him aware. Due to this low A1c and her many falls I would recommend a target A1c of at least 7%.   Mobility: Patient appears to be functioning at her baseline in the home;  she has hearing as well as sight  deficits. Son stated she was using her walker when she remembered. Fall prevention is an utmost goal due to her falling and sustaining a head injury  4 to 6 weeks ago.    Medication Compliance: We reviewed her medication and they are currently in blister packs from the hospital. At a prior visit and today the son reported he is not creating a medication box. They may want to consider pre-filled blister packs from a local pharmacy for ease of dosing, and patient could possibly even self administer. Patient has amlodipine 5 mg she is taking daily (medication listed on discharge from [facility]).    Follow up Palliative Care Visit: Palliative care will continue to follow for complex medical decision making, advance care planning, and clarification of goals.  Return 8 weeks or prn.  This visit was coded based on medical decision making (MDM).  PPS: 40%  HOSPICE ELIGIBILITY/DIAGNOSIS: TBD  Chief Complaint: self care deficits, immobility, debility  HISTORY OF PRESENT ILLNESS:  CIJI BOSTON is a 88 y.o. year old female  with low vision, debility, immobility, DM, frequent falls . Patient seen today to review palliative care needs to include medical decision making and advance care planning as appropriate.   History obtained from review of EMR, discussion with primary team, and interview with family, facility staff/caregiver and/or Judy Harvey.  I reviewed available labs, medications, imaging, studies and related documents from the EMR.  Records reviewed and summarized above.   ROS   General: NAD EYES: denies vision changes, low vision ENMT: denies dysphagia Cardiovascular: denies chest pain, denies DOE Pulmonary: denies cough, denies increased SOB Abdomen: endorses good appetite, denies constipation, endorses continence of bowel GU: denies dysuria, endorses continence of urine MSK:  endorses increased weakness,  no falls reported since home from SNF Skin: denies rashes or wounds, healing facial abrasions Neurological: denies pain, denies insomnia Psych: Endorses positive mood Heme/lymph/immuno: denies bruises, abnormal bleeding  Physical Exam: Current and past weights: 155 lbs reported Constitutional: NAD General: frail appearing EYES: anicteric sclera, lids intact, no discharge  ENMT: hard of  hearing, oral mucous membranes moist Pulmonary: no increased work of breathing, no cough, room air MSK: mod  sarcopenia, moves all extremities, ambulatory-rolling walker Skin: warm and dry, no rashes or wounds on visible skin, healing facial lacerations Neuro:  + generalized weakness,  mild cognitive impairment Psych: non-anxious affect, A and O x 2-3 Hem/lymph/immuno: no widespread bruising   Thank you for the opportunity to  participate in the care of Judy Harvey.  The palliative care team will continue to follow. Please call our office at [phone] if we can be of additional assistance.   Lorenza Burton, RN  Jason Coop DNP, MPH, AGPCNP-BC, [facility]   COVID-19 PATIENT SCREENING TOOL Asked and negative response unless otherwise noted:   Have you had symptoms of covid, tested positive or been in contact with someone with symptoms/positive test in the past 5-10 days?

## 2021-08-11 ENCOUNTER — Emergency Department: Payer: Medicare Other

## 2021-08-11 ENCOUNTER — Observation Stay
Admission: EM | Admit: 2021-08-11 | Discharge: 2021-08-12 | Disposition: A | Discharge status: Home / Self Care | Payer: Medicare Other | Attending: Internal Medicine | Admitting: Internal Medicine

## 2021-08-11 DIAGNOSIS — I9589 Other hypotension: Secondary | ICD-10-CM

## 2021-08-11 DIAGNOSIS — Z794 Long term (current) use of insulin: Secondary | ICD-10-CM | POA: Insufficient documentation

## 2021-08-11 DIAGNOSIS — I10 Essential (primary) hypertension: Secondary | ICD-10-CM | POA: Diagnosis not present

## 2021-08-11 DIAGNOSIS — E1129 Type 2 diabetes mellitus with other diabetic kidney complication: Secondary | ICD-10-CM | POA: Diagnosis present

## 2021-08-11 DIAGNOSIS — Z87891 Personal history of nicotine dependence: Secondary | ICD-10-CM | POA: Insufficient documentation

## 2021-08-11 DIAGNOSIS — E1142 Type 2 diabetes mellitus with diabetic polyneuropathy: Secondary | ICD-10-CM

## 2021-08-11 DIAGNOSIS — R531 Weakness: Secondary | ICD-10-CM | POA: Diagnosis present

## 2021-08-11 DIAGNOSIS — Z8673 Personal history of transient ischemic attack (TIA), and cerebral infarction without residual deficits: Secondary | ICD-10-CM | POA: Insufficient documentation

## 2021-08-11 DIAGNOSIS — I959 Hypotension, unspecified: Secondary | ICD-10-CM

## 2021-08-11 DIAGNOSIS — Z7902 Long term (current) use of antithrombotics/antiplatelets: Secondary | ICD-10-CM | POA: Diagnosis not present

## 2021-08-11 DIAGNOSIS — E1140 Type 2 diabetes mellitus with diabetic neuropathy, unspecified: Secondary | ICD-10-CM | POA: Diagnosis not present

## 2021-08-11 DIAGNOSIS — E1121 Type 2 diabetes mellitus with diabetic nephropathy: Secondary | ICD-10-CM

## 2021-08-11 DIAGNOSIS — E785 Hyperlipidemia, unspecified: Secondary | ICD-10-CM

## 2021-08-11 DIAGNOSIS — Z79899 Other long term (current) drug therapy: Secondary | ICD-10-CM | POA: Insufficient documentation

## 2021-08-11 DIAGNOSIS — Z955 Presence of coronary angioplasty implant and graft: Secondary | ICD-10-CM | POA: Diagnosis not present

## 2021-08-11 DIAGNOSIS — Z7982 Long term (current) use of aspirin: Secondary | ICD-10-CM | POA: Insufficient documentation

## 2021-08-11 DIAGNOSIS — Z7984 Long term (current) use of oral hypoglycemic drugs: Secondary | ICD-10-CM | POA: Insufficient documentation

## 2021-08-11 DIAGNOSIS — D649 Anemia, unspecified: Secondary | ICD-10-CM | POA: Diagnosis not present

## 2021-08-11 DIAGNOSIS — E861 Hypovolemia: Secondary | ICD-10-CM

## 2021-08-11 LAB — PROTIME-INR
INR: 1 (ref 0.8–1.2)
Prothrombin Time: 13.5 seconds (ref 11.4–15.2)

## 2021-08-11 LAB — PREPARE RBC (CROSSMATCH)

## 2021-08-11 LAB — RETICULOCYTES
Immature Retic Fract: 7 % (ref 2.3–15.9)
RBC.: 2.75 MIL/uL — ABNORMAL LOW (ref 3.87–5.11)
Retic Count, Absolute: 39.9 10*3/uL (ref 19.0–186.0)
Retic Ct Pct: 1.5 % (ref 0.4–3.1)

## 2021-08-11 LAB — COMPREHENSIVE METABOLIC PANEL
ALT: 8 U/L (ref 0–44)
AST: 14 U/L — ABNORMAL LOW (ref 15–41)
Albumin: 3.1 g/dL — ABNORMAL LOW (ref 3.5–5.0)
Alkaline Phosphatase: 67 U/L (ref 38–126)
Anion gap: 9 (ref 5–15)
BUN: 17 mg/dL (ref 8–23)
CO2: 26 mmol/L (ref 22–32)
Calcium: 8.4 mg/dL — ABNORMAL LOW (ref 8.9–10.3)
Chloride: 100 mmol/L (ref 98–111)
Creatinine, Ser: 1.14 mg/dL — ABNORMAL HIGH (ref 0.44–1.00)
GFR, Estimated: 45 mL/min — ABNORMAL LOW (ref >60)
Glucose, Bld: 295 mg/dL — ABNORMAL HIGH (ref 70–99)
Potassium: 4 mmol/L (ref 3.5–5.1)
Sodium: 135 mmol/L (ref 135–145)
Total Bilirubin: 0.5 mg/dL (ref 0.3–1.2)
Total Protein: 6.6 g/dL (ref 6.5–8.1)

## 2021-08-11 LAB — CBG MONITORING, ED: Glucose-Capillary: 274 mg/dL — ABNORMAL HIGH (ref 70–99)

## 2021-08-11 LAB — TROPONIN I (HIGH SENSITIVITY): Troponin I (High Sensitivity): 11 ng/L (ref <18)

## 2021-08-11 LAB — LACTATE DEHYDROGENASE: LDH: 118 U/L (ref 98–192)

## 2021-08-11 LAB — CBC
HCT: 21.1 % — ABNORMAL LOW (ref 36.0–46.0)
Hemoglobin: 6.1 g/dL — ABNORMAL LOW (ref 12.0–15.0)
MCH: 24.9 pg — ABNORMAL LOW (ref 26.0–34.0)
MCHC: 28.9 g/dL — ABNORMAL LOW (ref 30.0–36.0)
MCV: 86.1 fL (ref 80.0–100.0)
Platelets: 308 10*3/uL (ref 150–400)
RBC: 2.45 MIL/uL — ABNORMAL LOW (ref 3.87–5.11)
RDW: 17.6 % — ABNORMAL HIGH (ref 11.5–15.5)
WBC: 11.1 10*3/uL — ABNORMAL HIGH (ref 4.0–10.5)
nRBC: 0 % (ref 0.0–0.2)

## 2021-08-11 LAB — FOLATE: Folate: 6.2 ng/mL (ref >5.9)

## 2021-08-11 LAB — IRON AND TIBC
Iron: 60 ug/dL (ref 28–170)
Saturation Ratios: 15 % (ref 10.4–31.8)
TIBC: 391 ug/dL (ref 250–450)
UIBC: 331 ug/dL

## 2021-08-11 LAB — MAGNESIUM: Magnesium: 2 mg/dL (ref 1.7–2.4)

## 2021-08-11 LAB — FERRITIN: Ferritin: 6 ng/mL — ABNORMAL LOW (ref 11–307)

## 2021-08-11 MED ORDER — ACETAMINOPHEN 650 MG RE SUPP: 650.0000 mg | Freq: Four times a day (QID) | RECTAL | Status: DC | PRN

## 2021-08-11 MED ORDER — CLOPIDOGREL BISULFATE 75 MG PO TABS: 75.0000 mg | ORAL_TABLET | Freq: Every day | ORAL | Status: DC

## 2021-08-11 MED ORDER — ONDANSETRON HCL 4 MG PO TABS: 8.0000 mg | ORAL_TABLET | Freq: Three times a day (TID) | ORAL | Status: DC | PRN

## 2021-08-11 MED ORDER — SODIUM CHLORIDE 0.9 % IV SOLN: 10.0000 mL/h | Freq: Once | INTRAVENOUS | Status: DC

## 2021-08-11 MED ORDER — LACTATED RINGERS IV BOLUS
1000.0000 mL | Freq: Once | INTRAVENOUS | Status: AC
  Administered 2021-08-11: 1000 mL via INTRAVENOUS

## 2021-08-11 MED ORDER — MELATONIN 5 MG PO TABS
2.5000 mg | ORAL_TABLET | Freq: Every day | ORAL | Status: DC
  Administered 2021-08-11: 2.5 mg via ORAL
  Filled 2021-08-11: qty 1

## 2021-08-11 MED ORDER — ACETAMINOPHEN 325 MG PO TABS: 650.0000 mg | ORAL_TABLET | Freq: Four times a day (QID) | ORAL | Status: DC | PRN

## 2021-08-11 MED ORDER — FERROUS SULFATE 325 (65 FE) MG PO TABS
325.0000 mg | ORAL_TABLET | Freq: Every day | ORAL | Status: DC
  Administered 2021-08-12: 325 mg via ORAL
  Filled 2021-08-11: qty 1

## 2021-08-11 MED ORDER — ATORVASTATIN CALCIUM 20 MG PO TABS
80.0000 mg | ORAL_TABLET | Freq: Every day | ORAL | Status: DC
  Administered 2021-08-11: 80 mg via ORAL
  Filled 2021-08-11: qty 4

## 2021-08-11 MED ORDER — METOPROLOL TARTRATE 25 MG PO TABS
12.5000 mg | ORAL_TABLET | Freq: Two times a day (BID) | ORAL | Status: DC
  Administered 2021-08-11 – 2021-08-12 (×2): 12.5 mg via ORAL
  Filled 2021-08-11 (×2): qty 1

## 2021-08-11 MED ORDER — SODIUM CHLORIDE 0.9 % IV SOLN: INTRAVENOUS | Status: DC

## 2021-08-11 MED ORDER — ONDANSETRON HCL 4 MG PO TABS
4.0000 mg | ORAL_TABLET | Freq: Four times a day (QID) | ORAL | Status: DC | PRN
  Administered 2021-08-12: 4 mg via ORAL
  Filled 2021-08-11: qty 1

## 2021-08-11 MED ORDER — INSULIN GLARGINE-YFGN 100 UNIT/ML ~~LOC~~ SOLN
10.0000 [IU] | Freq: Every day | SUBCUTANEOUS | Status: DC
  Administered 2021-08-11: 10 [IU] via SUBCUTANEOUS
  Filled 2021-08-11 (×2): qty 0.1

## 2021-08-11 MED ORDER — ASPIRIN 81 MG PO TBEC
81.0000 mg | DELAYED_RELEASE_TABLET | Freq: Every day | ORAL | Status: DC
Start: 2021-08-11 — End: 2021-08-11

## 2021-08-11 MED ORDER — GABAPENTIN 300 MG PO CAPS
300.0000 mg | ORAL_CAPSULE | Freq: Every day | ORAL | Status: DC
  Administered 2021-08-12: 300 mg via ORAL
  Filled 2021-08-11: qty 1

## 2021-08-11 MED ORDER — ALBUTEROL SULFATE (2.5 MG/3ML) 0.083% IN NEBU: 3.0000 mL | INHALATION_SOLUTION | Freq: Four times a day (QID) | RESPIRATORY_TRACT | Status: DC | PRN

## 2021-08-11 MED ORDER — VITAMIN D 25 MCG (1000 UNIT) PO TABS
5000.0000 [IU] | ORAL_TABLET | Freq: Every day | ORAL | Status: DC
  Administered 2021-08-12: 5000 [IU] via ORAL
  Filled 2021-08-11: qty 5

## 2021-08-11 MED ORDER — ONDANSETRON HCL 4 MG/2ML IJ SOLN: 4.0000 mg | Freq: Four times a day (QID) | INTRAMUSCULAR | Status: DC | PRN

## 2021-08-11 MED ORDER — ENOXAPARIN SODIUM 30 MG/0.3ML IJ SOSY
30.0000 mg | PREFILLED_SYRINGE | INTRAMUSCULAR | Status: DC
  Administered 2021-08-11: 30 mg via SUBCUTANEOUS
  Filled 2021-08-11: qty 0.3

## 2021-08-11 MED ORDER — TRAZODONE HCL 50 MG PO TABS
50.0000 mg | ORAL_TABLET | Freq: Every day | ORAL | Status: DC
  Administered 2021-08-11: 50 mg via ORAL
  Filled 2021-08-11: qty 1

## 2021-08-11 MED ORDER — TRAZODONE HCL 50 MG PO TABS: 25.0000 mg | ORAL_TABLET | Freq: Every evening | ORAL | Status: DC | PRN

## 2021-08-11 MED ORDER — DULOXETINE HCL 30 MG PO CPEP
30.0000 mg | ORAL_CAPSULE | Freq: Every day | ORAL | Status: DC
  Administered 2021-08-12: 30 mg via ORAL
  Filled 2021-08-11: qty 1

## 2021-08-11 MED ORDER — PANTOPRAZOLE SODIUM 40 MG PO TBEC
40.0000 mg | DELAYED_RELEASE_TABLET | Freq: Two times a day (BID) | ORAL | Status: DC
  Administered 2021-08-11 – 2021-08-12 (×2): 40 mg via ORAL
  Filled 2021-08-11 (×2): qty 1

## 2021-08-11 NOTE — Assessment & Plan Note (Signed)
-   We will continue her amlodipine and Lopressor.

## 2021-08-11 NOTE — H&P (Incomplete Revision)
[place]   PATIENT NAME: Judy Judy    MR#:  [id number]  DATE OF BIRTH:  [birth year]  DATE OF ADMISSION:  08/11/2021  PRIMARY CARE PHYSICIAN: Mick Sell, MD   Patient is coming from: Home  REQUESTING/REFERRING PHYSICIAN: Willy Eddy, MD  CHIEF COMPLAINT:   Chief Complaint  Patient presents with   Weakness   Altered Mental Status    Family reports increasing weakness and "slowed" speech; Patient was orthostatic with EMS and received 500 bolus; States that she feels dizzy when she stands up    HISTORY OF PRESENT ILLNESS:  Judy Judy is a 88 y.o. Caucasian female with medical history significant for type 2 diabetes mellitus, depression, hypertension, dyslipidemia, peripheral neuropathy, CVA and macular degeneration, who presented to the emergency room with acute onset of generalized weakness with lightheadedness.  The patient was orthostatic with EMS and was given 500 mL IV normal saline bolus.  She admits to dizziness especially when she stands up.  No melena or bright red then per rectum.  No other bleeding diathesis.  She has been having nausea without vomiting but admits to mild diarrhea.  She denies any abdominal pain.  No fever or chills.  No dysuria, oliguria or hematuria or flank pain.  She denied any chest pain or palpitations.  No cough or wheezing or dyspnea.  ED Course: Upon presentation to the emergency room, BP was 142/80 with temperature 97.4 with respiratory rate of 22 with otherwise normal vital signs. Labs revealed blood glucose of 295 and a creatinine 1.14 with albumin of 3.1 and total protein 6.6 and otherwise normal CMP.  CBC showed hemoglobin of 6.1 and hematocrit 21.6 compared to hemoglobin of 8.6 and hematocrit of 30.9.  WBC was 11.1 and platelets 308. EKG as reviewed by me : EKG showed normal sinus rhythm with a rate of 78 and PACs with borderline prolonged PR interval, right bundle branch block and left anterior fascicular  block. Imaging: Portable chest x-ray showed chronic course and interstitial markings with no active disease.  The patient was given 1 L bolus of IV lactated Ringer.  He will be admitted to a medical telemetry bed for further evaluation and management. PAST MEDICAL HISTORY:   Past Medical History:  Diagnosis Date   Anemia    Depression    Diabetes mellitus without complication (HCC)    GERD (gastroesophageal reflux disease)    History of COVID-19    Hyperlipidemia    Hypertension    Legally blind    Macular degeneration, bilateral    Peripheral neuropathy    Post herpetic neuralgia    located in her back   Stroke [employer])     PAST SURGICAL HISTORY:   Past Surgical History:  Procedure Laterality Date   ABDOMINAL HYSTERECTOMY     partial   APPENDECTOMY     BREAST SURGERY     reduction   CAROTID PTA/STENT INTERVENTION Left 10/18/2017   Procedure: CAROTID PTA/STENT INTERVENTION;  Surgeon: Annice Needy, MD;  Location: [facility] INVASIVE CV LAB;  Service: Cardiovascular;  Laterality: Left;   CATARACT EXTRACTION, BILATERAL     CESAREAN SECTION     x3   COLONOSCOPY     ESOPHAGEAL DILATION      SOCIAL HISTORY:   Social History   Tobacco Use   Smoking status: Former    Packs/day: 3.00    Years: 50.00    Pack years: 150.00    Types: Cigarettes  Quit date: 2008    Years since quitting: 15.4   Smokeless tobacco: Never   Tobacco comments:    smoking cessation materials not required  Substance Use Topics   Alcohol use: No    FAMILY HISTORY:   Family History  Problem Relation Age of Onset   Healthy Mother    Diabetes Father    Alzheimer's disease Father    Multiple sclerosis Sister    Multiple sclerosis Brother     DRUG ALLERGIES:   Allergies  Allergen Reactions   Adhesive [Tape] Other (See Comments)    "pulls my skin off" paper tape ok   Lyrica [Pregabalin]     Body spasms    REVIEW OF SYSTEMS:   ROS As per history of present illness. All pertinent systems  were reviewed above. Constitutional, HEENT, cardiovascular, respiratory, GI, GU, musculoskeletal, neuro, psychiatric, endocrine, integumentary and hematologic systems were reviewed and are otherwise negative/unremarkable except for positive findings mentioned above in the HPI.   MEDICATIONS AT HOME:   Prior to Admission medications   Medication Sig Start Date End Date Taking? Authorizing Provider  acetaminophen (TYLENOL) 325 MG tablet Take 650 mg by mouth every 6 (six) hours as needed for mild pain or fever.    [provider]  albuterol (ACCUNEB) 1.25 MG/3ML nebulizer solution Take 3 mLs by nebulization every 6 (six) hours as needed for wheezing or shortness of breath. 11/12/19   [provider]  albuterol (VENTOLIN HFA) 108 (90 Base) MCG/ACT inhaler Inhale 2 puffs into the lungs every 6 (six) hours as needed for shortness of breath or wheezing. 11/06/19   [provider]  aspirin EC 81 MG tablet Take 81 mg by mouth daily.    [provider]  atorvastatin (LIPITOR) 80 MG tablet Take 1 tablet (80 mg total) by mouth at bedtime. 05/22/21   Hongalgi, Maximino Greenland, MD  Cholecalciferol (VITAMIN D3) 5000 units CAPS Take 1 capsule (5,000 Units total) by mouth daily. 06/28/16   Plonk, Chrissie Noa, MD  clopidogrel (PLAVIX) 75 MG tablet Take 75 mg by mouth daily.    [provider]  DULoxetine (CYMBALTA) 30 MG capsule TAKE 1 CAPSULE BY MOUTH EVERY DAY Patient taking differently: Take 30 mg by mouth daily. 08/21/18   Duanne Limerick, MD  ferrous sulfate 325 (65 FE) MG tablet Take 325 mg by mouth daily.    [provider]  gabapentin (NEURONTIN) 300 MG capsule Take 300 mg by mouth daily.    [provider]  insulin glargine (LANTUS) 100 UNIT/ML injection Inject 0.1 mLs (10 Units total) into the skin at bedtime. Patient not taking: Reported on 08/03/2021 05/25/21   Arnetha Courser, MD  Melatonin 3 MG CAPS Take 3 mg by mouth at bedtime.    [provider]   metFORMIN (GLUCOPHAGE-XR) 500 MG 24 hr tablet Take 500 mg by mouth 2 (two) times daily. Patient not taking: Reported on 08/03/2021 03/23/21   [provider]  metoprolol tartrate (LOPRESSOR) 25 MG tablet Take 0.5 tablets (12.5 mg total) by mouth 2 (two) times daily. 05/22/21   Hongalgi, Maximino Greenland, MD  ondansetron (ZOFRAN) 8 MG tablet Take 8 mg by mouth every 8 (eight) hours as needed for nausea or vomiting.     [provider]  pantoprazole (PROTONIX) 40 MG tablet Take 40 mg by mouth 2 (two) times daily.    [provider]  polyethylene glycol (MIRALAX / GLYCOLAX) 17 g packet Take 17 g by mouth daily. Patient not taking:  Reported on 08/03/2021 05/23/21   Elease EtienneHongalgi, Anand D, MD  traZODone (DESYREL) 50 MG tablet Take 50 mg by mouth at bedtime. 05/04/21   [provider]      VITAL SIGNS:  Blood pressure 110/89, pulse 62, temperature 98.2 F (36.8 C), temperature source Oral, resp. rate 20, height 5\' 2"  (1.575 m), weight 71.9 kg, SpO2 100 %.  PHYSICAL EXAMINATION:  Physical Exam  GENERAL:  88 y.o.-year-old Caucasian female patient lying in the bed with no acute distress.  EYES: Pupils equal, round, reactive to light and accommodation. No scleral icterus.  Positive pallor.  Extraocular muscles intact.  HEENT: Head atraumatic, normocephalic. Oropharynx and nasopharynx clear.  NECK:  Supple, no jugular venous distention. No thyroid enlargement, no tenderness.  LUNGS: Normal breath sounds bilaterally, no wheezing, rales,rhonchi or crepitation. No use of accessory muscles of respiration.  CARDIOVASCULAR: Regular rate and rhythm, S1, S2 normal. No murmurs, rubs, or gallops.  ABDOMEN: Soft, nondistended, nontender. Bowel sounds present. No organomegaly or mass.  EXTREMITIES: No pedal edema, cyanosis, or clubbing.  NEUROLOGIC: Cranial nerves II through XII are intact. Muscle strength 5/5 in all extremities. Sensation intact. Gait not checked.  PSYCHIATRIC: The patient is  alert and oriented x 3.  Normal affect and good eye contact. SKIN: No obvious rash, lesion, or ulcer.   LABORATORY PANEL:   CBC Recent Labs  Lab 08/11/21 1700  WBC 11.1*  HGB 6.1*  HCT 21.1*  PLT 308   ------------------------------------------------------------------------------------------------------------------  Chemistries  Recent Labs  Lab 08/11/21 1700 08/11/21 1710  NA 135  --   K 4.0  --   CL 100  --   CO2 26  --   GLUCOSE 295*  --   BUN 17  --   CREATININE 1.14*  --   CALCIUM 8.4*  --   MG  --  2.0  AST 14*  --   ALT 8  --   ALKPHOS 67  --   BILITOT 0.5  --    ------------------------------------------------------------------------------------------------------------------  Cardiac Enzymes No results for input(s): TROPONINI in the last 168 hours. ------------------------------------------------------------------------------------------------------------------  RADIOLOGY:  CT HEAD WO CONTRAST (5MM)  Result Date: 08/11/2021 CLINICAL DATA:  Mental status change. EXAM: CT HEAD WITHOUT CONTRAST TECHNIQUE: Contiguous axial images were obtained from the base of the skull through the vertex without intravenous contrast. RADIATION DOSE REDUCTION: This exam was performed according to the departmental dose-optimization program which includes automated exposure control, adjustment of the mA and/or kV according to patient size and/or use of iterative reconstruction technique. COMPARISON:  CT head 05/15/2021 FINDINGS: Brain: Generalized atrophy. Moderate white matter changes consistent with chronic microvascular ischemia. Small chronic infarct left thalamus. Negative for acute infarct, hemorrhage, mass Vascular: Negative for hyperdense vessel Skull: Negative Sinuses/Orbits: Mild mucosal edema right ethmoid sinus. Remaining sinuses clear. Mastoid clear. Bilateral cataract extraction Other: None IMPRESSION: No acute abnormality. Atrophy and moderate chronic microvascular  ischemia. Stable from prior CT. Electronically Signed   By: Marlan Palauharles  Clark M.D.   On: 08/11/2021 18:32   DG Chest Portable 1 View  Result Date: 08/11/2021 CLINICAL DATA:  weakness, eval for edema EXAM: PORTABLE CHEST 1 VIEW.  Patient is rotated. COMPARISON:  Chest x-ray 05/24/2021, CT chest 05/19/2021. chest x-ray 10/25/2020 FINDINGS: The heart and mediastinal contours are unchanged. Aortic calcification. No definite focal consolidation. Chronic coarsened interstitial markings with no overt pulmonary edema. No pleural effusion. No pneumothorax. No acute osseous abnormality. IMPRESSION: No active disease in a patient with chronic coarsened interstitial markings. Electronically Signed  By: Tish Frederickson M.D.   On: 08/11/2021 18:42      IMPRESSION AND PLAN:  Assessment and Plan: * Symptomatic anemia - The patient be admitted to a medical telemetry observation bed. - She was typed and crossed medicine will be transfused units of packed red blood cells. - We will follow posttransfusion H&H as well as serial levels. - Stool Hemoccult was ordered but she has bowel movement. - She had a rectal exam by the ER physician that showed no melena or BRBPR.  Hypotension - This is likely hypotension as a manifestation of orthostatic hypotension from volume depletion. - The patient will be hydrated with IV normal saline and will follow orthostatics with PRBCs transfusion.  Diabetic neuropathy (HCC) - We will continue Neurontin.  Essential hypertension - We will continue her amlodipine and Lopressor.  Dyslipidemia -We will continue statin therapy.  Type II diabetes mellitus with renal manifestations (HCC) - The patient will be placed on supplemental coverage with NovoLog. - We will hold off his metformin.   DVT prophylaxis: SCDs. Advanced Care Planning:  Code Status: The patient is DNR/DNI.  This was discussed with her. Family Communication:  The plan of care was discussed in details with the  patient (and family). I answered all questions. The patient agreed to proceed with the above mentioned plan. Further management will depend upon hospital course. Disposition Plan: Back to previous home environment Consults called: none. All the records are reviewed and case discussed with ED provider.  Status is: Observation                     I certify that at the time of admission, it is my clinical judgment that the patient will require hospital care extending less than 2 midnights.                            Dispo: The patient is from: Home              Anticipated d/c is to: Home              Patient currently is not medically stable to d/c.              Difficult to place patient: No  Hannah Beat M.D on 08/11/2021 at 9:50 PM  [facility]   From 7 PM-7 AM, contact night-coverage [email or web address]  CC: Primary care physician; Mick Sell, MD

## 2021-08-11 NOTE — ED Provider Notes (Signed)
[facility] Provider Note    Event Date/Time   First MD Initiated Contact with Patient 08/11/21 1712     (approximate)   History   Weakness and Altered Mental Status (Family reports increasing weakness and "slowed" speech; Patient was orthostatic with EMS and received 500 bolus; States that she feels dizzy when she stands up)   HPI  Judy Harvey is a 88 y.o. female extensive past medical history including GERD, diabetes, anemia presents to the ER for evaluation of weakness.  The symptoms have been worsening over the past few days.  Feeling very dizzy and lightheaded when she stands up.  She denies any abdominal pain.  No melena no hematochezia.  No vomiting.  Denies any chest pain or pressure.  Family reports that her speech was slower than normal.  She is found to be orthostatic.  Given IV fluids in route and brought to the ER     Physical Exam   Triage Vital Signs: ED Triage Vitals  Enc Vitals Group     BP 08/11/21 1642 105/71     Pulse Rate 08/11/21 1642 79     Resp 08/11/21 1642 19     Temp 08/11/21 1642 97.8 F (36.6 C)     Temp Source 08/11/21 1642 Oral     SpO2 08/11/21 1636 97 %     Weight 08/11/21 1644 158 lb 8.2 oz (71.9 kg)     Height 08/11/21 1644 5\' 2"  (1.575 m)     Head Circumference --      Peak Flow --      Pain Score --      Pain Loc --      Pain Edu? --      Excl. in [place]? --     Most recent vital signs: Vitals:   08/11/21 2115 08/11/21 2206  BP: 110/89 127/85  Pulse: 62 (!) 110  Resp: 20 20  Temp:  98 F (36.7 C)  SpO2: 100% 100%     Constitutional: Alert, pale appearing Eyes: Conjunctivae are normal.  Head: Atraumatic. Nose: No congestion/rhinnorhea. Mouth/Throat: Mucous membranes are moist.   Neck: Painless ROM.  Cardiovascular:   Good peripheral circulation. Respiratory: Normal respiratory effort.  No retractions.  Gastrointestinal: Soft and nontender.  Musculoskeletal:  no deformity Neurologic:  MAE  spontaneously. No gross focal neurologic deficits are appreciated.  Skin:  Skin is warm, dry and intact. No rash noted. Psychiatric: Mood and affect are normal. Speech and behavior are normal.    ED Results / Procedures / Treatments   Labs (all labs ordered are listed, but only abnormal results are displayed) Labs Reviewed  COMPREHENSIVE METABOLIC PANEL - Abnormal; Notable for the following components:      Result Value   Glucose, Bld 295 (*)    Creatinine, Ser 1.14 (*)    Calcium 8.4 (*)    Albumin 3.1 (*)    AST 14 (*)    GFR, Estimated 45 (*)    All other components within normal limits  CBC - Abnormal; Notable for the following components:   WBC 11.1 (*)    RBC 2.45 (*)    Hemoglobin 6.1 (*)    HCT 21.1 (*)    MCH 24.9 (*)    MCHC 28.9 (*)    RDW 17.6 (*)    All other components within normal limits  CBG MONITORING, ED - Abnormal; Notable for the following components:   Glucose-Capillary 274 (*)    All other  components within normal limits  PROTIME-INR  MAGNESIUM  OCCULT BLOOD X 1 CARD TO LAB, STOOL  BASIC METABOLIC PANEL  CBC  FERRITIN  IRON AND TIBC  VITAMIN B12  FOLATE  LACTATE DEHYDROGENASE  RETICULOCYTES  PREPARE RBC (CROSSMATCH)  TYPE AND SCREEN  TROPONIN I (HIGH SENSITIVITY)     EKG  ED ECG REPORT I, Merlyn Lot, the attending physician, personally viewed and interpreted this ECG.   Date: 08/11/2021  EKG Time: 16:38  Rate: 80  Rhythm: sinus  Axis: normal  Intervals:  normal  ST&T Change: no stemi, no depressions    RADIOLOGY Please see ED Course for my review and interpretation.  I personally reviewed all radiographic images ordered to evaluate for the above acute complaints and reviewed radiology reports and findings.  These findings were personally discussed with the patient.  Please see medical record for radiology report.    PROCEDURES:  Critical Care performed: Yes, see critical care procedure note(s)  .Critical  Care Performed by: Merlyn Lot, MD Authorized by: Merlyn Lot, MD   Critical care provider statement:    Critical care time (minutes):  35   Critical care was necessary to treat or prevent imminent or life-threatening deterioration of the following conditions:  Circulatory failure   Critical care was time spent personally by me on the following activities:  Ordering and performing treatments and interventions, ordering and review of laboratory studies, ordering and review of radiographic studies, pulse oximetry, re-evaluation of patient's condition, review of old charts, obtaining history from patient or surrogate, examination of patient, evaluation of patient's response to treatment, discussions with primary provider, discussions with consultants and development of treatment plan with patient or surrogate   MEDICATIONS ORDERED IN ED: Medications  0.9 %  sodium chloride infusion (0 mL/hr Intravenous Hold 08/11/21 1802)  atorvastatin (LIPITOR) tablet 80 mg (has no administration in time range)  metoprolol tartrate (LOPRESSOR) tablet 12.5 mg (has no administration in time range)  DULoxetine (CYMBALTA) DR capsule 30 mg (has no administration in time range)  traZODone (DESYREL) tablet 50 mg (has no administration in time range)  insulin glargine-yfgn (SEMGLEE) injection 10 Units (has no administration in time range)  pantoprazole (PROTONIX) EC tablet 40 mg (has no administration in time range)  ferrous sulfate tablet 325 mg (has no administration in time range)  melatonin tablet 2.5 mg (has no administration in time range)  gabapentin (NEURONTIN) capsule 300 mg (has no administration in time range)  cholecalciferol (VITAMIN D3) tablet 5,000 Units (has no administration in time range)  albuterol (PROVENTIL) (2.5 MG/3ML) 0.083% nebulizer solution 3 mL (has no administration in time range)  enoxaparin (LOVENOX) injection 30 mg (has no administration in time range)  0.9 %  sodium chloride  infusion (has no administration in time range)  acetaminophen (TYLENOL) tablet 650 mg (has no administration in time range)    Or  acetaminophen (TYLENOL) suppository 650 mg (has no administration in time range)  traZODone (DESYREL) tablet 25 mg (has no administration in time range)  ondansetron (ZOFRAN) tablet 4 mg (has no administration in time range)    Or  ondansetron (ZOFRAN) injection 4 mg (has no administration in time range)  lactated ringers bolus 1,000 mL (0 mLs Intravenous Stopped 08/11/21 2022)     IMPRESSION / MDM / [place] / ED COURSE  I reviewed the triage vital signs and the nursing notes.  Differential diagnosis includes, but is not limited to, anemia, dehydration, sepsis, GIB, electrolyte abnormality, cva  Patient presented to the ER for evaluation of weakness and symptoms as described above.  This presenting complaint could reflect a potentially life-threatening illness therefore the patient will be placed on continuous pulse oximetry and telemetry for monitoring.  Laboratory evaluation will be sent to evaluate for the above complaints.      Clinical Course as of 08/11/21 2209  Wed Aug 11, 2021  1819 Chest x-ray on my interpretation without pneumothorax will await formal radiology report. [PR]  [id number] No melena or hematochezia on DRE.  She is hypertensive with anemia given her frailty factors will transfuse to feel she will require hospitalization. [PR]  2012 Blood pressure improved with IV fluids.  She is receiving PRBCs.  Have consulted with hospitalist who agreed up admit patient to their service. [PR]    Clinical Course User Index [PR] Merlyn Lot, MD     FINAL CLINICAL IMPRESSION(S) / ED DIAGNOSES   Final diagnoses:  Symptomatic anemia     Rx / DC Orders   ED Discharge Orders     None        Note:  This document was prepared using Dragon voice recognition software and may include unintentional  dictation errors.    Merlyn Lot, MD 08/11/21 2209

## 2021-08-11 NOTE — Assessment & Plan Note (Addendum)
-   The patient be admitted to a medical telemetry observation bed. - She was typed and crossed medicine will be transfused units of packed red blood cells. - We will follow posttransfusion H&H as well as serial levels. - Stool Hemoccult was ordered but she has bowel movement. - We will obtain anemia work-up. - She had a rectal exam by the ER physician that showed no melena or BRBPR.

## 2021-08-11 NOTE — Assessment & Plan Note (Addendum)
-   This is likely hypotension as a manifestation of orthostatic hypotension from volume depletion. - The patient will be hydrated with IV normal saline and will follow orthostatics with PRBCs transfusion.

## 2021-08-11 NOTE — Assessment & Plan Note (Signed)
-   The patient will be placed on supplemental coverage with NovoLog. - We will hold off his metformin.

## 2021-08-11 NOTE — ED Triage Notes (Signed)
Family reports increasing weakness and "slowed" speech; Patient was orthostatic with EMS and received 500 bolus; States that she feels dizzy when she stands up

## 2021-08-11 NOTE — Assessment & Plan Note (Signed)
-   We will continue Neurontin. ?

## 2021-08-11 NOTE — Assessment & Plan Note (Signed)
-   We will continue statin therapy. 

## 2021-08-11 NOTE — H&P (Signed)
[place]   PATIENT NAME: Judy Judy    MR#:  [id number]  DATE OF BIRTH:  [birth year]  DATE OF ADMISSION:  08/11/2021  PRIMARY CARE PHYSICIAN: Mick Sell, MD   Patient is coming from: Home  REQUESTING/REFERRING PHYSICIAN: Willy Eddy, MD  CHIEF COMPLAINT:   Chief Complaint  Patient presents with   Weakness   Altered Mental Status    Family reports increasing weakness and "slowed" speech; Patient was orthostatic with EMS and received 500 bolus; States that she feels dizzy when she stands up    HISTORY OF PRESENT ILLNESS:  Judy Judy is a 88 y.o. Caucasian female with medical history significant for type 2 diabetes mellitus, depression, hypertension, dyslipidemia, peripheral neuropathy, CVA and macular degeneration, who presented to the emergency room with acute onset of generalized weakness with lightheadedness.  The patient was orthostatic with EMS and was given 500 mL IV normal saline bolus.  She admits to dizziness especially when she stands up.  No melena or bright red then per rectum.  No other bleeding diathesis.  She has been having nausea without vomiting but admits to mild diarrhea.  She denies any abdominal pain.  No fever or chills.  No dysuria, oliguria or hematuria or flank pain.  She denied any chest pain or palpitations.  No cough or wheezing or dyspnea.  ED Course: Upon presentation to the emergency room, BP was 142/80 with temperature 97.4 with respiratory rate of 22 with otherwise normal vital signs. Labs revealed blood glucose of 295 and a creatinine 1.14 with albumin of 3.1 and total protein 6.6 and otherwise normal CMP.  CBC showed hemoglobin of 6.1 and hematocrit 21.6 compared to hemoglobin of 8.6 and hematocrit of 30.9.  WBC was 11.1 and platelets 308. EKG as reviewed by me : EKG showed normal sinus rhythm with a rate of 78 and PACs with borderline prolonged PR interval, right bundle branch block and left anterior fascicular  block. Imaging: Portable chest x-ray showed chronic course and interstitial markings with no active disease.  The patient was given 1 L bolus of IV lactated Ringer.  He will be admitted to a medical telemetry bed for further evaluation and management. PAST MEDICAL HISTORY:   Past Medical History:  Diagnosis Date   Anemia    Depression    Diabetes mellitus without complication (HCC)    GERD (gastroesophageal reflux disease)    History of COVID-19    Hyperlipidemia    Hypertension    Legally blind    Macular degeneration, bilateral    Peripheral neuropathy    Post herpetic neuralgia    located in her back   Stroke [employer])     PAST SURGICAL HISTORY:   Past Surgical History:  Procedure Laterality Date   ABDOMINAL HYSTERECTOMY     partial   APPENDECTOMY     BREAST SURGERY     reduction   CAROTID PTA/STENT INTERVENTION Left 10/18/2017   Procedure: CAROTID PTA/STENT INTERVENTION;  Surgeon: Annice Needy, MD;  Location: [facility] INVASIVE CV LAB;  Service: Cardiovascular;  Laterality: Left;   CATARACT EXTRACTION, BILATERAL     CESAREAN SECTION     x3   COLONOSCOPY     ESOPHAGEAL DILATION      SOCIAL HISTORY:   Social History   Tobacco Use   Smoking status: Former    Packs/day: 3.00    Years: 50.00    Pack years: 150.00    Types: Cigarettes  Quit date: 2008    Years since quitting: 15.4   Smokeless tobacco: Never   Tobacco comments:    smoking cessation materials not required  Substance Use Topics   Alcohol use: No    FAMILY HISTORY:   Family History  Problem Relation Age of Onset   Healthy Mother    Diabetes Father    Alzheimer's disease Father    Multiple sclerosis Sister    Multiple sclerosis Brother     DRUG ALLERGIES:   Allergies  Allergen Reactions   Adhesive [Tape] Other (See Comments)    "pulls my skin off" paper tape ok   Lyrica [Pregabalin]     Body spasms    REVIEW OF SYSTEMS:   ROS As per history of present illness. All pertinent systems  were reviewed above. Constitutional, HEENT, cardiovascular, respiratory, GI, GU, musculoskeletal, neuro, psychiatric, endocrine, integumentary and hematologic systems were reviewed and are otherwise negative/unremarkable except for positive findings mentioned above in the HPI.   MEDICATIONS AT HOME:   Prior to Admission medications   Medication Sig Start Date End Date Taking? Authorizing Provider  acetaminophen (TYLENOL) 325 MG tablet Take 650 mg by mouth every 6 (six) hours as needed for mild pain or fever.    [provider]  albuterol (ACCUNEB) 1.25 MG/3ML nebulizer solution Take 3 mLs by nebulization every 6 (six) hours as needed for wheezing or shortness of breath. 11/12/19   [provider]  albuterol (VENTOLIN HFA) 108 (90 Base) MCG/ACT inhaler Inhale 2 puffs into the lungs every 6 (six) hours as needed for shortness of breath or wheezing. 11/06/19   [provider]  aspirin EC 81 MG tablet Take 81 mg by mouth daily.    [provider]  atorvastatin (LIPITOR) 80 MG tablet Take 1 tablet (80 mg total) by mouth at bedtime. 05/22/21   Hongalgi, Maximino Greenland, MD  Cholecalciferol (VITAMIN D3) 5000 units CAPS Take 1 capsule (5,000 Units total) by mouth daily. 06/28/16   Plonk, Chrissie Noa, MD  clopidogrel (PLAVIX) 75 MG tablet Take 75 mg by mouth daily.    [provider]  DULoxetine (CYMBALTA) 30 MG capsule TAKE 1 CAPSULE BY MOUTH EVERY DAY Patient taking differently: Take 30 mg by mouth daily. 08/21/18   Duanne Limerick, MD  ferrous sulfate 325 (65 FE) MG tablet Take 325 mg by mouth daily.    [provider]  gabapentin (NEURONTIN) 300 MG capsule Take 300 mg by mouth daily.    [provider]  insulin glargine (LANTUS) 100 UNIT/ML injection Inject 0.1 mLs (10 Units total) into the skin at bedtime. Patient not taking: Reported on 08/03/2021 05/25/21   Arnetha Courser, MD  Melatonin 3 MG CAPS Take 3 mg by mouth at bedtime.    [provider]   metFORMIN (GLUCOPHAGE-XR) 500 MG 24 hr tablet Take 500 mg by mouth 2 (two) times daily. Patient not taking: Reported on 08/03/2021 03/23/21   [provider]  metoprolol tartrate (LOPRESSOR) 25 MG tablet Take 0.5 tablets (12.5 mg total) by mouth 2 (two) times daily. 05/22/21   Hongalgi, Maximino Greenland, MD  ondansetron (ZOFRAN) 8 MG tablet Take 8 mg by mouth every 8 (eight) hours as needed for nausea or vomiting.     [provider]  pantoprazole (PROTONIX) 40 MG tablet Take 40 mg by mouth 2 (two) times daily.    [provider]  polyethylene glycol (MIRALAX / GLYCOLAX) 17 g packet Take 17 g by mouth daily. Patient not taking:  Reported on 08/03/2021 05/23/21   Elease EtienneHongalgi, Anand D, MD  traZODone (DESYREL) 50 MG tablet Take 50 mg by mouth at bedtime. 05/04/21   [provider]      VITAL SIGNS:  Blood pressure 110/89, pulse 62, temperature 98.2 F (36.8 C), temperature source Oral, resp. rate 20, height 5\' 2"  (1.575 m), weight 71.9 kg, SpO2 100 %.  PHYSICAL EXAMINATION:  Physical Exam  GENERAL:  88 y.o.-year-old Caucasian female patient lying in the bed with no acute distress.  EYES: Pupils equal, round, reactive to light and accommodation. No scleral icterus.  Positive pallor.  Extraocular muscles intact.  HEENT: Head atraumatic, normocephalic. Oropharynx and nasopharynx clear.  NECK:  Supple, no jugular venous distention. No thyroid enlargement, no tenderness.  LUNGS: Normal breath sounds bilaterally, no wheezing, rales,rhonchi or crepitation. No use of accessory muscles of respiration.  CARDIOVASCULAR: Regular rate and rhythm, S1, S2 normal. No murmurs, rubs, or gallops.  ABDOMEN: Soft, nondistended, nontender. Bowel sounds present. No organomegaly or mass.  EXTREMITIES: No pedal edema, cyanosis, or clubbing.  NEUROLOGIC: Cranial nerves II through XII are intact. Muscle strength 5/5 in all extremities. Sensation intact. Gait not checked.  PSYCHIATRIC: The patient is  alert and oriented x 3.  Normal affect and good eye contact. SKIN: No obvious rash, lesion, or ulcer.   LABORATORY PANEL:   CBC Recent Labs  Lab 08/11/21 1700  WBC 11.1*  HGB 6.1*  HCT 21.1*  PLT 308   ------------------------------------------------------------------------------------------------------------------  Chemistries  Recent Labs  Lab 08/11/21 1700 08/11/21 1710  NA 135  --   K 4.0  --   CL 100  --   CO2 26  --   GLUCOSE 295*  --   BUN 17  --   CREATININE 1.14*  --   CALCIUM 8.4*  --   MG  --  2.0  AST 14*  --   ALT 8  --   ALKPHOS 67  --   BILITOT 0.5  --    ------------------------------------------------------------------------------------------------------------------  Cardiac Enzymes No results for input(s): TROPONINI in the last 168 hours. ------------------------------------------------------------------------------------------------------------------  RADIOLOGY:  CT HEAD WO CONTRAST (5MM)  Result Date: 08/11/2021 CLINICAL DATA:  Mental status change. EXAM: CT HEAD WITHOUT CONTRAST TECHNIQUE: Contiguous axial images were obtained from the base of the skull through the vertex without intravenous contrast. RADIATION DOSE REDUCTION: This exam was performed according to the departmental dose-optimization program which includes automated exposure control, adjustment of the mA and/or kV according to patient size and/or use of iterative reconstruction technique. COMPARISON:  CT head 05/15/2021 FINDINGS: Brain: Generalized atrophy. Moderate white matter changes consistent with chronic microvascular ischemia. Small chronic infarct left thalamus. Negative for acute infarct, hemorrhage, mass Vascular: Negative for hyperdense vessel Skull: Negative Sinuses/Orbits: Mild mucosal edema right ethmoid sinus. Remaining sinuses clear. Mastoid clear. Bilateral cataract extraction Other: None IMPRESSION: No acute abnormality. Atrophy and moderate chronic microvascular  ischemia. Stable from prior CT. Electronically Signed   By: Marlan Palauharles  Clark M.D.   On: 08/11/2021 18:32   DG Chest Portable 1 View  Result Date: 08/11/2021 CLINICAL DATA:  weakness, eval for edema EXAM: PORTABLE CHEST 1 VIEW.  Patient is rotated. COMPARISON:  Chest x-ray 05/24/2021, CT chest 05/19/2021. chest x-ray 10/25/2020 FINDINGS: The heart and mediastinal contours are unchanged. Aortic calcification. No definite focal consolidation. Chronic coarsened interstitial markings with no overt pulmonary edema. No pleural effusion. No pneumothorax. No acute osseous abnormality. IMPRESSION: No active disease in a patient with chronic coarsened interstitial markings. Electronically Signed  By: Morgane  Naveau M.D.   On: 08/11/2021 18:42      IMPRESSION AND PLAN:  Assessment and Plan: * Symptomatic anemia - The patient be admitted to a medical telemetry observation bed. - She was typed and crossed medicine will be transfused units of packed red blood cells. - We will follow posttransfusion H&H as well as serial levels. - Stool Hemoccult was ordered but she has bowel movement. - She had a rectal exam by the ER physician that showed no melena or BRBPR.  Hypotension - This is likely hypotension as a manifestation of orthostatic hypotension from volume depletion. - The patient will be hydrated with IV normal saline and will follow orthostatics with PRBCs transfusion.  Diabetic neuropathy (HCC) - We will continue Neurontin.  Essential hypertension - We will continue her amlodipine and Lopressor.  Dyslipidemia -We will continue statin therapy.  Type II diabetes mellitus with renal manifestations (HCC) - The patient will be placed on supplemental coverage with NovoLog. - We will hold off his metformin.   DVT prophylaxis: SCDs. Advanced Care Planning:  Code Status: The patient is DNR/DNI.  This was discussed with her. Family Communication:  The plan of care was discussed in details with the  patient (and family). I answered all questions. The patient agreed to proceed with the above mentioned plan. Further management will depend upon hospital course. Disposition Plan: Back to previous home environment Consults called: none. All the records are reviewed and case discussed with ED provider.  Status is: Observation                     I certify that at the time of admission, it is my clinical judgment that the patient will require hospital care extending less than 2 midnights.                            Dispo: The patient is from: Home              Anticipated d/c is to: Home              Patient currently is not medically stable to d/c.              Difficult to place patient: No  Weber Monnier A Oria Klimas M.D on 08/11/2021 at 9:50 PM  [facility]   From 7 PM-7 AM, contact night-coverage [email or web address]  CC: Primary care physician; Fitzgerald, David P, MD  

## 2021-08-12 DIAGNOSIS — D649 Anemia, unspecified: Secondary | ICD-10-CM | POA: Diagnosis not present

## 2021-08-12 LAB — BASIC METABOLIC PANEL
Anion gap: 5 (ref 5–15)
BUN: 16 mg/dL (ref 8–23)
CO2: 30 mmol/L (ref 22–32)
Calcium: 8 mg/dL — ABNORMAL LOW (ref 8.9–10.3)
Chloride: 104 mmol/L (ref 98–111)
Creatinine, Ser: 1.03 mg/dL — ABNORMAL HIGH (ref 0.44–1.00)
GFR, Estimated: 51 mL/min — ABNORMAL LOW (ref >60)
Glucose, Bld: 169 mg/dL — ABNORMAL HIGH (ref 70–99)
Potassium: 3.5 mmol/L (ref 3.5–5.1)
Sodium: 139 mmol/L (ref 135–145)

## 2021-08-12 LAB — CBC
HCT: 22.2 % — ABNORMAL LOW (ref 36.0–46.0)
Hemoglobin: 6.7 g/dL — ABNORMAL LOW (ref 12.0–15.0)
MCH: 25.3 pg — ABNORMAL LOW (ref 26.0–34.0)
MCHC: 30.2 g/dL (ref 30.0–36.0)
MCV: 83.8 fL (ref 80.0–100.0)
Platelets: 260 10*3/uL (ref 150–400)
RBC: 2.65 MIL/uL — ABNORMAL LOW (ref 3.87–5.11)
RDW: 16.8 % — ABNORMAL HIGH (ref 11.5–15.5)
WBC: 9.6 10*3/uL (ref 4.0–10.5)
nRBC: 0 % (ref 0.0–0.2)

## 2021-08-12 LAB — GLUCOSE, CAPILLARY: Glucose-Capillary: 307 mg/dL — ABNORMAL HIGH (ref 70–99)

## 2021-08-12 LAB — PREPARE RBC (CROSSMATCH)

## 2021-08-12 LAB — VITAMIN B12: Vitamin B-12: 729 pg/mL (ref 180–914)

## 2021-08-12 MED ORDER — SODIUM CHLORIDE 0.9% IV SOLUTION: Freq: Once | INTRAVENOUS | Status: AC

## 2021-08-12 MED ORDER — INSULIN ASPART 100 UNIT/ML IJ SOLN: 0.0000 [IU] | Freq: Every day | INTRAMUSCULAR | Status: DC

## 2021-08-12 MED ORDER — INSULIN ASPART 100 UNIT/ML IJ SOLN
0.0000 [IU] | Freq: Three times a day (TID) | INTRAMUSCULAR | Status: DC
  Administered 2021-08-12: 5 [IU] via SUBCUTANEOUS
  Filled 2021-08-12: qty 1

## 2021-08-12 NOTE — Plan of Care (Signed)
  Problem: Clinical Measurements: Goal: Ability to maintain clinical measurements within normal limits will improve Outcome: Progressing   

## 2021-08-12 NOTE — Progress Notes (Signed)
Inpatient Diabetes Program Recommendations  AACE/ADA: New Consensus Statement on Inpatient Glycemic Control (2015)  Target Ranges:  Prepandial:   less than 140 mg/dL      Peak postprandial:   less than 180 mg/dL (1-2 hours)      Critically ill patients:  140 - 180 mg/dL   Lab Results  Component Value Date   GLUCAP 274 (H) 08/11/2021   HGBA1C 6.8 (H) 05/15/2021    Review of Glycemic Control  Diabetes history: DM2 Outpatient Diabetes medications: Lantus 2 units q hs Current orders for Inpatient glycemic control: Semglee 10 units  Inpatient Diabetes Program Recommendations:   Spoke with son Judy Harvey via phone to verify how much insulin has been giving his mom @ hs and he has currently been giving 2 units hs per direction of the home health nurse due to patient having lower CBGs. -Add Glycemic control order set 0-6 units tid with CBGs qid -D/C Semglee and evaluate CBGs today. Secure chat sent to Dr. Nelson Chimes.  Thank you, Billy Fischer. Selig Wampole, RN, MSN, CDE  Diabetes Coordinator Inpatient Glycemic Control Team Team Pager [phone] (8am-5pm) 08/12/2021 11:13 AM

## 2021-08-12 NOTE — Hospital Course (Signed)
Taken from H&P.  Judy Harvey is a 88 y.o. Caucasian female with medical history significant for type 2 diabetes mellitus, depression, hypertension, dyslipidemia, peripheral neuropathy, CVA and macular degeneration, who presented to the emergency room with acute onset of generalized weakness with lightheadedness.  The patient was orthostatic with EMS and was given 500 mL IV normal saline bolus.  She admits to dizziness especially when she stands up.  No melena or bright red then per rectum.  No other bleeding diathesis.  She has been having nausea without vomiting but admits to mild diarrhea.  She denies any abdominal pain.  No fever or chills.  No dysuria, oliguria or hematuria or flank pain.  She denied any chest pain or palpitations.  No cough or wheezing or dyspnea.  ED Course: Upon presentation to the emergency room, BP was 142/80 with temperature 97.4 with respiratory rate of 22 with otherwise normal vital signs. Labs revealed blood glucose of 295 and a creatinine 1.14 with albumin of 3.1 and total protein 6.6 and otherwise normal CMP.  CBC showed hemoglobin of 6.1 and hematocrit 21.6 compared to hemoglobin of 8.6 and hematocrit of 30.9.  WBC was 11.1 and platelets 308. EKG as reviewed by me : EKG showed normal sinus rhythm with a rate of 78 and PACs with borderline prolonged PR interval, right bundle branch block and left anterior fascicular block. Imaging: Portable chest x-ray showed chronic course and interstitial markings with no active disease.  The patient was given 1 L bolus of IV lactated Ringer.  He will be admitted to a medical telemetry bed for further evaluation and management.  ED provider did a rectal exam and it was negative for any occult bleeding. Anemia panel with low ferritin, abnormally normal reticulocyte count and rest of the study was normal.  She was started on iron supplement and received 2 unit of PRBC. She was feeling much improved after getting blood  transfusions.  She will continue with current medications and follow-up with her primary care provider for further recommendations.

## 2021-08-12 NOTE — TOC Initial Note (Signed)
Transition of Care [facility]) - Initial/Assessment Note    Patient Details  Name: Judy Harvey MRN: [id number] Date of Birth: April 14, 1927  Transition of Care [facility]) CM/SW Contact:    Conception Oms, RN Phone Number: 08/12/2021, 12:18 PM  Clinical Narrative:                  Transition of Care (TOC) Screening Note   Patient Details  Name: Judy Harvey Date of Birth: 18-Aug-1927   Transition of Care [facility]) CM/SW Contact:    Conception Oms, RN Phone Number: 08/12/2021, 12:18 PM  The patient is followed by [facility] outpatient [facility]iative, they manage needs, lives with her son, has a rolling walker at home  Transition of Care Department [facility]) has reviewed patient and no TOC needs have been identified at this time. We will continue to monitor patient advancement through interdisciplinary progression rounds. If new patient transition needs arise, please place a TOC consult.          Patient Goals and CMS Choice        Expected Discharge Plan and Services                                                Prior Living Arrangements/Services                       Activities of Daily Living      Permission Sought/Granted                  Emotional Assessment              Admission diagnosis:  Symptomatic anemia [D64.9] Patient Active Problem List   Diagnosis Date Noted   Symptomatic anemia 08/11/2021   Dyslipidemia 08/11/2021   Essential hypertension 08/11/2021   Diabetic neuropathy ([place]) 08/11/2021   Hypotension 08/11/2021   Hypoglycemia 05/24/2021   Chronic kidney disease, stage 3a ([place]) 05/24/2021   HTN (hypertension) 05/24/2021   Type II diabetes mellitus with renal manifestations ([place]) 05/24/2021   Generalized weakness 05/19/2021   Elevated troponin 05/19/2021   Chronic respiratory failure with hypoxia ([place]) 05/19/2021   COPD (chronic obstructive pulmonary disease) ([place]) 05/19/2021   Acute CVA (cerebrovascular accident)  ([place]) 05/15/2021   Left sided numbness 05/14/2021   ARF (acute renal failure) ([place]) 03/12/2021   Hypotension due to hypovolemia    Acute respiratory failure with hypoxia (HCC)    Pneumonitis 10/25/2020   Major depressive 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 ([place]) 08/21/2018   Low vitamin B12 level 04/26/2018   Iron deficiency anemia 03/28/2018   Frequent PVCs 03/21/2018   PSVT (paroxysmal supraventricular tachycardia) ([place]) 03/21/2018   Normocytic anemia 02/28/2018   Benign essential HTN 02/20/2018   SOBOE (shortness of breath on exertion) 02/20/2018   Carotid stenosis, symptomatic w/o infarct, left 10/18/2017   TIA (transient ischemic attack) 06/16/2017   Bilateral carotid artery stenosis 06/16/2017   Hyperlipidemia 06/16/2017   Obesity (BMI 30.0-34.9) 05/02/2016   Vitamin D deficiency 03/25/2016   Type 2 diabetes mellitus without complication, with long-term current use of insulin ([place]) 03/24/2016   Recent cerebrovascular accident 05/14/21 (CVA) 03/24/2016   Legally blind 03/24/2016   Macular degeneration 03/24/2016   Constipation 03/24/2016   History of esophageal stricture 03/24/2016   Depression 03/24/2016   Restless leg syndrome 03/24/2016   Postherpetic neuralgia 03/24/2016  Gait abnormality 03/24/2016   PCP:  Leonel Ramsay, MD Pharmacy:   [id number] - [place], [place] [address] Phone: [phone] Fax: [phone]     Social Determinants of Health (SDOH) Interventions    Readmission Risk Interventions     View : No data to display.

## 2021-08-12 NOTE — Discharge Summary (Signed)
Physician Discharge Summary   Patient: Judy Harvey MRN: [id number] DOB: [birth year]  Admit date:     08/11/2021  Discharge date: 08/12/21  Discharge Physician: Lorella Nimrod   PCP: Leonel Ramsay, MD   Recommendations at discharge:  Please obtain CBC and BMP in 1 week Follow-up with primary care provider within a week  Discharge Diagnoses: Principal Problem:   Symptomatic anemia Active Problems:   Hypotension   Type II diabetes mellitus with renal manifestations ([place])   Dyslipidemia   Essential hypertension   Diabetic neuropathy [facility])   Hospital Course: Taken from H&P.  Judy Harvey is a 88 y.o. Caucasian female with medical history significant for type 2 diabetes mellitus, depression, hypertension, dyslipidemia, peripheral neuropathy, CVA and macular degeneration, who presented to the emergency room with acute onset of generalized weakness with lightheadedness.  The patient was orthostatic with EMS and was given 500 mL IV normal saline bolus.  She admits to dizziness especially when she stands up.  No melena or bright red then per rectum.  No other bleeding diathesis.  She has been having nausea without vomiting but admits to mild diarrhea.  She denies any abdominal pain.  No fever or chills.  No dysuria, oliguria or hematuria or flank pain.  She denied any chest pain or palpitations.  No cough or wheezing or dyspnea.  ED Course: Upon presentation to the emergency room, BP was 142/80 with temperature 97.4 with respiratory rate of 22 with otherwise normal vital signs. Labs revealed blood glucose of 295 and a creatinine 1.14 with albumin of 3.1 and total protein 6.6 and otherwise normal CMP.  CBC showed hemoglobin of 6.1 and hematocrit 21.6 compared to hemoglobin of 8.6 and hematocrit of 30.9.  WBC was 11.1 and platelets 308. EKG as reviewed by me : EKG showed normal sinus rhythm with a rate of 78 and PACs with borderline prolonged PR interval, right bundle branch block and  left anterior fascicular block. Imaging: Portable chest x-ray showed chronic course and interstitial markings with no active disease.  The patient was given 1 L bolus of IV lactated Ringer.  He will be admitted to a medical telemetry bed for further evaluation and management.  ED provider did a rectal exam and it was negative for any occult bleeding. Anemia panel with low ferritin, abnormally normal reticulocyte count and rest of the study was normal.  She was started on iron supplement and received 2 unit of PRBC. She was feeling much improved after getting blood transfusions.  She will continue with current medications and follow-up with her primary care provider for further recommendations.  Assessment and Plan: * Symptomatic anemia - The patient be admitted to a medical telemetry observation bed. - She was typed and crossed medicine will be transfused units of packed red blood cells. - We will follow posttransfusion H&H as well as serial levels. - Stool Hemoccult was ordered but she has bowel movement. - We will obtain anemia work-up. - She had a rectal exam by the ER physician that showed no melena or BRBPR.  Hypotension - This is likely hypotension as a manifestation of orthostatic hypotension from volume depletion. - The patient will be hydrated with IV normal saline and will follow orthostatics with PRBCs transfusion.  Type II diabetes mellitus with renal manifestations ([place]) - The patient will be placed on supplemental coverage with NovoLog. - We will hold off his metformin.  Diabetic neuropathy ([facility]) - We will continue Neurontin.  Essential hypertension - We will  continue her amlodipine and Lopressor.  Dyslipidemia -We will continue statin therapy.   Consultants: None Procedures performed: None Disposition: Home Diet recommendation:  Discharge Diet Orders (From admission, onward)     Start     Ordered   08/12/21 0000  Diet - low sodium heart healthy         08/12/21 1449           Cardiac and Carb modified diet DISCHARGE MEDICATION: Allergies as of 08/12/2021       Reactions   Adhesive [tape] Other (See Comments)   "pulls my skin off" paper tape ok   Lyrica [pregabalin]    Body spasms        Medication List     STOP taking these medications    clopidogrel 75 MG tablet Commonly known as: PLAVIX       TAKE these medications    acetaminophen 325 MG tablet Commonly known as: TYLENOL Take 650 mg by mouth every 6 (six) hours as needed for mild pain or fever.   albuterol 108 (90 Base) MCG/ACT inhaler Commonly known as: VENTOLIN HFA Inhale 2 puffs into the lungs every 6 (six) hours as needed for shortness of breath or wheezing.   albuterol 1.25 MG/3ML nebulizer solution Commonly known as: ACCUNEB Take 3 mLs by nebulization every 6 (six) hours as needed for wheezing or shortness of breath.   aspirin EC 81 MG tablet Take 81 mg by mouth daily.   atorvastatin 80 MG tablet Commonly known as: LIPITOR Take 1 tablet (80 mg total) by mouth at bedtime.   DULoxetine 30 MG capsule Commonly known as: CYMBALTA TAKE 1 CAPSULE BY MOUTH EVERY DAY What changed:  how much to take how to take this when to take this additional instructions   ferrous sulfate 325 (65 FE) MG tablet Take 325 mg by mouth daily.   gabapentin 300 MG capsule Commonly known as: NEURONTIN Take 300 mg by mouth daily.   insulin glargine 100 UNIT/ML injection Commonly known as: LANTUS Inject 0.1 mLs (10 Units total) into the skin at bedtime.   Melatonin 3 MG Caps Take 3 mg by mouth at bedtime.   metFORMIN 500 MG 24 hr tablet Commonly known as: GLUCOPHAGE-XR Take 500 mg by mouth 2 (two) times daily.   metoprolol tartrate 25 MG tablet Commonly known as: LOPRESSOR Take 0.5 tablets (12.5 mg total) by mouth 2 (two) times daily.   ondansetron 8 MG tablet Commonly known as: ZOFRAN Take 8 mg by mouth every 8 (eight) hours as needed for nausea or  vomiting.   pantoprazole 40 MG tablet Commonly known as: PROTONIX Take 40 mg by mouth 2 (two) times daily.   polyethylene glycol 17 g packet Commonly known as: MIRALAX / GLYCOLAX Take 17 g by mouth daily.   traZODone 50 MG tablet Commonly known as: DESYREL Take 50 mg by mouth at bedtime.   Vitamin D3 125 MCG (5000 UT) Caps Take 1 capsule (5,000 Units total) by mouth daily.        Follow-up Information     Leonel Ramsay, MD. Schedule an appointment as soon as possible for a visit in 1 week(s).   Specialty: Infectious Diseases Contact information: [place] [address] [phone]                Discharge Exam: Danley Danker Weights   08/11/21 1644  Weight: 71.9 kg   General.     In no acute distress. Pulmonary.  Lungs clear  bilaterally, normal respiratory effort. CV.  Regular rate and rhythm, no JVD, rub or murmur. Abdomen.  Soft, nontender, nondistended, BS positive. CNS.  Alert and oriented .  No focal neurologic deficit. Extremities.  No edema, no cyanosis, pulses intact and symmetrical. Psychiatry.  Judgment and insight appears normal.   Condition at discharge: stable  The results of significant diagnostics from this hospitalization (including imaging, microbiology, ancillary and laboratory) are listed below for reference.   Imaging Studies: CT HEAD WO CONTRAST (5MM)  Result Date: 08/11/2021 CLINICAL DATA:  Mental status change. EXAM: CT HEAD WITHOUT CONTRAST TECHNIQUE: Contiguous axial images were obtained from the base of the skull through the vertex without intravenous contrast. RADIATION DOSE REDUCTION: This exam was performed according to the departmental dose-optimization program which includes automated exposure control, adjustment of the mA and/or kV according to patient size and/or use of iterative reconstruction technique. COMPARISON:  CT head 05/15/2021 FINDINGS: Brain: Generalized atrophy. Moderate white matter changes  consistent with chronic microvascular ischemia. Small chronic infarct left thalamus. Negative for acute infarct, hemorrhage, mass Vascular: Negative for hyperdense vessel Skull: Negative Sinuses/Orbits: Mild mucosal edema right ethmoid sinus. Remaining sinuses clear. Mastoid clear. Bilateral cataract extraction Other: None IMPRESSION: No acute abnormality. Atrophy and moderate chronic microvascular ischemia. Stable from prior CT. Electronically Signed   By: Franchot Gallo M.D.   On: 08/11/2021 18:32   DG Chest Portable 1 View  Result Date: 08/11/2021 CLINICAL DATA:  weakness, eval for edema EXAM: PORTABLE CHEST 1 VIEW.  Patient is rotated. COMPARISON:  Chest x-ray 05/24/2021, CT chest 05/19/2021. chest x-ray 10/25/2020 FINDINGS: The heart and mediastinal contours are unchanged. Aortic calcification. No definite focal consolidation. Chronic coarsened interstitial markings with no overt pulmonary edema. No pleural effusion. No pneumothorax. No acute osseous abnormality. IMPRESSION: No active disease in a patient with chronic coarsened interstitial markings. Electronically Signed   By: Iven Finn M.D.   On: 08/11/2021 18:42    Microbiology: Results for orders placed or performed during the hospital encounter of 05/24/21  Resp Panel by RT-PCR (Flu A&B, Covid) Nasopharyngeal Swab     Status: None   Collection Time: 05/24/21 10:36 AM   Specimen: Nasopharyngeal Swab; Nasopharyngeal(NP) swabs in vial transport medium  Result Value Ref Range Status   SARS Coronavirus 2 by RT PCR NEGATIVE NEGATIVE Final    Comment: (NOTE) SARS-CoV-2 target nucleic acids are NOT DETECTED.  The SARS-CoV-2 RNA is generally detectable in upper respiratory specimens during the acute phase of infection. The lowest concentration of SARS-CoV-2 viral copies this assay can detect is 138 copies/mL. A negative result does not preclude SARS-Cov-2 infection and should not be used as the sole basis for treatment or other patient  management decisions. A negative result may occur with  improper specimen collection/handling, submission of specimen other than nasopharyngeal swab, presence of viral mutation(s) within the areas targeted by this assay, and inadequate number of viral copies(<138 copies/mL). A negative result must be combined with clinical observations, patient history, and epidemiological information. The expected result is Negative.  Fact Sheet for Patients:  EntrepreneurPulse.com.au  Fact Sheet for Healthcare Providers:  IncredibleEmployment.be  This test is no t yet approved or cleared by the [place] FDA and  has been authorized for detection and/or diagnosis of SARS-CoV-2 by FDA under an Emergency Use Authorization (EUA). This EUA will remain  in effect (meaning this test can be used) for the duration of the COVID-19 declaration under Section 564(b)(1) of the Act, 21 U.S.C.section 360bbb-3(b)(1), unless the authorization  is terminated  or revoked sooner.       Influenza A by PCR NEGATIVE NEGATIVE Final   Influenza B by PCR NEGATIVE NEGATIVE Final    Comment: (NOTE) The Xpert Xpress SARS-CoV-2/FLU/RSV plus assay is intended as an aid in the diagnosis of influenza from Nasopharyngeal swab specimens and should not be used as a sole basis for treatment. Nasal washings and aspirates are unacceptable for Xpert Xpress SARS-CoV-2/FLU/RSV testing.  Fact Sheet for Patients: EntrepreneurPulse.com.au  Fact Sheet for Healthcare Providers: IncredibleEmployment.be  This test is not yet approved or cleared by the [place] FDA and has been authorized for detection and/or diagnosis of SARS-CoV-2 by FDA under an Emergency Use Authorization (EUA). This EUA will remain in effect (meaning this test can be used) for the duration of the COVID-19 declaration under Section 564(b)(1) of the Act, 21 U.S.C. section 360bbb-3(b)(1),  unless the authorization is terminated or revoked.  Performed at [facility], [address] [place], [place] [address]     Labs: CBC: Recent Labs  Lab 08/11/21 1700 08/12/21 0344  WBC 11.1* 9.6  HGB 6.1* 6.7*  HCT 21.1* 22.2*  MCV 86.1 83.8  PLT 308 [id number]   Basic Metabolic Panel: Recent Labs  Lab 08/11/21 1700 08/11/21 1710 08/12/21 0344  NA 135  --  139  K 4.0  --  3.5  CL 100  --  104  CO2 26  --  30  GLUCOSE 295*  --  169*  BUN 17  --  16  CREATININE 1.14*  --  1.03*  CALCIUM 8.4*  --  8.0*  MG  --  2.0  --    Liver Function Tests: Recent Labs  Lab 08/11/21 1700  AST 14*  ALT 8  ALKPHOS 67  BILITOT 0.5  PROT 6.6  ALBUMIN 3.1*   CBG: Recent Labs  Lab 08/11/21 1639 08/12/21 1148  GLUCAP 274* 307*    Discharge time spent: greater than 30 minutes.  This record has been created using Systems analyst. Errors have been sought and corrected,but may not always be located. Such creation errors do not reflect on the standard of care.   Signed: Lorella Nimrod, MD [facility] 08/12/2021

## 2021-08-13 LAB — BPAM RBC
Blood Product Expiration Date: 202306022359
Blood Product Expiration Date: 202306052359
ISSUE DATE / TIME: 202305312011
ISSUE DATE / TIME: 202306011314
Unit Type and Rh: 600
Unit Type and Rh: 600

## 2021-08-13 LAB — TYPE AND SCREEN
ABO/RH(D): A POS
Antibody Screen: NEGATIVE
Unit division: 0
Unit division: 0

## 2021-08-19 ENCOUNTER — Telehealth: Payer: Self-pay

## 2021-08-19 NOTE — Telephone Encounter (Signed)
-----   Message from Secundino Ginger sent at 08/19/2021  8:33 AM EDT ----- Regarding: [facility] SENT TO HER CHART.  THIS IS A DR [facility]U PATIENT THAT CANCELLED HER APPT B/C SHE WAS ADMITTED. LET DR Tasia Catchings KNOW NOTES FROM DAVID FITZGERALD MD ARE IN HER CHART. PLEASE CALL PATIENT TO RESCHEDULE HER APPT.

## 2021-08-19 NOTE — Telephone Encounter (Signed)
Patient is now out of hospital she needs to r/s appointment.

## 2021-08-30 ENCOUNTER — Inpatient Hospital Stay: Payer: Medicare Other

## 2021-08-30 ENCOUNTER — Other Ambulatory Visit: Payer: Self-pay

## 2021-08-30 ENCOUNTER — Inpatient Hospital Stay: Payer: Medicare Other | Attending: Oncology | Admitting: Oncology

## 2021-08-30 ENCOUNTER — Encounter: Payer: Self-pay | Admitting: Oncology

## 2021-08-30 VITALS — BP 110/89 | HR 94 | Temp 98.1°F | Resp 18

## 2021-08-30 DIAGNOSIS — D509 Iron deficiency anemia, unspecified: Secondary | ICD-10-CM | POA: Insufficient documentation

## 2021-08-30 DIAGNOSIS — Z87891 Personal history of nicotine dependence: Secondary | ICD-10-CM | POA: Insufficient documentation

## 2021-08-30 DIAGNOSIS — N1832 Anemia in chronic kidney disease: Secondary | ICD-10-CM

## 2021-08-30 LAB — RETIC PANEL
Immature Retic Fract: 13.6 % (ref 2.3–15.9)
RBC.: 3.05 MIL/uL — ABNORMAL LOW (ref 3.87–5.11)
Retic Count, Absolute: 62 10*3/uL (ref 19.0–186.0)
Retic Ct Pct: 2 % (ref 0.4–3.1)
Reticulocyte Hemoglobin: 18.7 pg — ABNORMAL LOW (ref >27.9)

## 2021-08-30 LAB — IRON AND TIBC
Iron: 22 ug/dL — ABNORMAL LOW (ref 28–170)
Saturation Ratios: 6 % — ABNORMAL LOW (ref 10.4–31.8)
TIBC: 385 ug/dL (ref 250–450)
UIBC: 363 ug/dL

## 2021-08-30 LAB — FERRITIN: Ferritin: 10 ng/mL — ABNORMAL LOW (ref 11–307)

## 2021-08-30 NOTE — Progress Notes (Signed)
Hematology/Oncology Progress note [phone] [phone]      Patient Care Team: Leonel Ramsay, MD as PCP - General (Infectious Diseases) Earlie Server, MD as Consulting Physician (Hematology and Oncology)   CHIEF COMPLAINTS/REASON FOR VISIT:  Follow up for iron deficiency anemia  HISTORY OF PRESENTING ILLNESS:  Judy Harvey is a  88 y.o.  female with PMH listed below was seen in consultation at the request of Leonel Ramsay, MD for follow up iron deficiency anemia.   Reviewed patient's recent labs patient was hospitalized from 03/12/2021 - 03/19/2021, due to nausea vomiting, AKI, acute urinary retention, symptomatic anemia and acute hypoxic respiratory failure. 03/19/2021 Labs revealed anemia with hemoglobin of 7.5, MCV 85.8. Patient received 1 unit of PRBC during her most recent hospitalization  Patient has acute on chronic renal insufficiency, creatinine was increased to 3.7 which responded to IV fluid and decreased to 1.28 at discharge.  This was felt to be secondary to poor oral intake. 03/12/2021, saturation 4, ferritin 6, TIBC 361, iron 14. Reviewed patient's previous labs ordered by primary care physician's office, anemia is chronic onset , duration is since at 2019. Patient is a poor historian.  She is blind.  She does not know if she has any blood in the stool.  She was accompanied by her son [name].  Per son, patient does not have good appetite.  Patient reports that she has taken oral iron supplementation Lately. She has an appointment with urology this week.  INTERVAL HISTORY Judy Harvey is a 88 y.o. female who has above history reviewed by me today presents for follow up visit for management of iron deficiency anemia.  Her appointment has been rescheduled multiple times due to hospitalization.  Most recent hospitalization 08/11/2021 - 08/13/2019 Hemoglobin 6.1, hematocrit 21.6, symptomatic anemia, got blood transfusion.  08/18/2021 patient had  a CBC done at primary care doctor's office with hemoglobin 9.0, MCV 87.3. Patient presents for posthospitalization follow-up.  + fatigue. + SOB.  No weight was obtained due to weakness. Denies any active bleeding events.  Patient was accompanied by his son.   Review of Systems  Constitutional:  Positive for appetite change and fatigue. Negative for chills and fever.  HENT:   Negative for hearing loss and voice change.   Eyes:  Negative for eye problems.  Respiratory:  Positive for shortness of breath. Negative for chest tightness and cough.   Cardiovascular:  Negative for chest pain.  Gastrointestinal:  Negative for abdominal distention, abdominal pain and nausea.  Endocrine: Negative for hot flashes.  Genitourinary:  Negative for difficulty urinating and frequency.   Musculoskeletal:  Negative for arthralgias.  Skin:  Negative for itching and rash.  Neurological:  Negative for extremity weakness.  Hematological:  Negative for adenopathy.  Psychiatric/Behavioral:  Negative for confusion.     MEDICAL HISTORY:  Past Medical History:  Diagnosis Date   Anemia    Depression    Diabetes mellitus without complication (HCC)    GERD (gastroesophageal reflux disease)    History of COVID-19    Hyperlipidemia    Hypertension    Legally blind    Macular degeneration, bilateral    Peripheral neuropathy    Post herpetic neuralgia    located in her back   Stroke [facility])     SURGICAL HISTORY: Past Surgical History:  Procedure Laterality Date   ABDOMINAL HYSTERECTOMY     partial   APPENDECTOMY     BREAST SURGERY     reduction  CAROTID PTA/STENT INTERVENTION Left 10/18/2017   Procedure: CAROTID PTA/STENT INTERVENTION;  Surgeon: Algernon Huxley, MD;  Location: [place] [facility];  Service: Cardiovascular;  Laterality: Left;   CATARACT EXTRACTION, BILATERAL     CESAREAN SECTION     x3   COLONOSCOPY     ESOPHAGEAL DILATION      SOCIAL HISTORY: Social History   Socioeconomic History    Marital status: Widowed    Spouse name: Not on file   Number of children: 3   Years of education: some college   Highest education level: Not on file  Occupational History   Occupation: Disabled  Tobacco Use   Smoking status: Former    Packs/day: 3.00    Years: 50.00    Total pack years: 150.00    Types: Cigarettes    Quit date: 2008    Years since quitting: 15.4   Smokeless tobacco: Never   Tobacco comments:    smoking cessation materials not required  Vaping Use   Vaping Use: Never used  Substance and Sexual Activity   Alcohol use: No   Drug use: No   Sexual activity: Not Currently  Other Topics Concern   Not on file  Social History Narrative   Pt lives with son [name]   Social Determinants of Health   Financial Resource Strain: Low Risk  (03/29/2017)   Overall Financial Resource Strain (CARDIA)    Difficulty of Paying Living Expenses: Not hard at all  Food Insecurity: No Food Insecurity (03/29/2017)   Hunger Vital Sign    Worried About Running Out of Food in the Last Year: Never true    [place] in the Last Year: Never true  Transportation Needs: Unmet Transportation Needs (03/29/2017)   PRAPARE - Transportation    Lack of Transportation (Medical): Yes    Lack of Transportation (Non-Medical): Yes  Physical Activity: Inactive (03/29/2017)   Exercise Vital Sign    Days of Exercise per Week: 0 days    Minutes of Exercise per Session: 0 min  Stress: No Stress Concern Present (03/29/2017)   [place] of Stress : Not at all  Social Connections: Unknown (04/16/2018)   Social Connection and Isolation Panel [NHANES]    Frequency of Communication with Friends and Family: More than three times a week    Frequency of Social Gatherings with Friends and Family: Three times a week    Attends Religious Services: Never    Active Member of Clubs or Organizations: No    Attends Archivist  Meetings: Never    Marital Status: Not on file  Intimate Partner Violence: Unknown (03/29/2017)   Humiliation, Afraid, Rape, and Kick questionnaire    Fear of Current or Ex-Partner: Patient refused    Emotionally Abused: Patient refused    Physically Abused: Patient refused    Sexually Abused: Patient refused    FAMILY HISTORY: Family History  Problem Relation Age of Onset   Healthy Mother    Diabetes Father    Alzheimer's disease Father    Multiple sclerosis Sister    Multiple sclerosis Brother     ALLERGIES:  is allergic to adhesive [tape] and lyrica [pregabalin].  MEDICATIONS:  Current Outpatient Medications  Medication Sig Dispense Refill   acetaminophen (TYLENOL) 325 MG tablet Take 650 mg by mouth every 6 (six) hours as needed for mild pain or fever.     aspirin EC 81 MG tablet  Take 81 mg by mouth daily.     atorvastatin (LIPITOR) 80 MG tablet Take 1 tablet (80 mg total) by mouth at bedtime. 30 tablet 1   Cholecalciferol (VITAMIN D3) 5000 units CAPS Take 1 capsule (5,000 Units total) by mouth daily. 30 capsule    ferrous sulfate 325 (65 FE) MG tablet Take 325 mg by mouth daily.     gabapentin (NEURONTIN) 300 MG capsule Take 300 mg by mouth daily.     insulin glargine (LANTUS) 100 UNIT/ML injection Inject 0.1 mLs (10 Units total) into the skin at bedtime. (Patient taking differently: Inject 44 Units into the skin at bedtime.) 10 mL 11   metFORMIN (GLUCOPHAGE-XR) 500 MG 24 hr tablet Take 500 mg by mouth 2 (two) times daily.     metoprolol tartrate (LOPRESSOR) 25 MG tablet Take 0.5 tablets (12.5 mg total) by mouth 2 (two) times daily. 60 tablet 0   ondansetron (ZOFRAN) 8 MG tablet Take 8 mg by mouth every 8 (eight) hours as needed for nausea or vomiting.      traZODone (DESYREL) 50 MG tablet Take 50 mg by mouth at bedtime.     albuterol (ACCUNEB) 1.25 MG/3ML nebulizer solution Take 3 mLs by nebulization every 6 (six) hours as needed for wheezing or shortness of breath. (Patient  not taking: Reported on 08/30/2021)     albuterol (VENTOLIN HFA) 108 (90 Base) MCG/ACT inhaler Inhale 2 puffs into the lungs every 6 (six) hours as needed for shortness of breath or wheezing. (Patient not taking: Reported on 08/30/2021)     DULoxetine (CYMBALTA) 30 MG capsule TAKE 1 CAPSULE BY MOUTH EVERY DAY (Patient not taking: Reported on 08/30/2021) 90 capsule 1   Melatonin 3 MG CAPS Take 3 mg by mouth at bedtime. (Patient not taking: Reported on 08/30/2021)     pantoprazole (PROTONIX) 40 MG tablet Take 40 mg by mouth 2 (two) times daily. (Patient not taking: Reported on 08/30/2021)     polyethylene glycol (MIRALAX / GLYCOLAX) 17 g packet Take 17 g by mouth daily. (Patient not taking: Reported on 08/03/2021) 14 each 0   No current facility-administered medications for this visit.     PHYSICAL EXAMINATION: ECOG PERFORMANCE STATUS: 2 - Symptomatic, <50% confined to bed Vitals:   08/30/21 1309  BP: 110/89  Pulse: 94  Resp: 18  Temp: 98.1 F (36.7 C)   Filed Weights    Physical Exam Constitutional:      General: She is not in acute distress.    Comments: Frail appearance elderly female, she sits in a wheelchair.  HENT:     Head: Normocephalic and atraumatic.  Eyes:     General: No scleral icterus. Cardiovascular:     Rate and Rhythm: Normal rate and regular rhythm.     Heart sounds: Normal heart sounds.  Pulmonary:     Effort: Pulmonary effort is normal. No respiratory distress.     Breath sounds: No wheezing.  Abdominal:     Palpations: Abdomen is soft.  Musculoskeletal:        General: No deformity. Normal range of motion.     Cervical back: Normal range of motion and neck supple.  Skin:    General: Skin is warm.     Coloration: Skin is pale.  Neurological:     Mental Status: She is alert. Mental status is at baseline.     Cranial Nerves: No cranial nerve deficit.  Psychiatric:        Mood and Affect: Mood normal.  Latest Ref Rng & Units 08/12/2021    3:44  AM  CMP  Glucose 70 - 99 mg/dL 169   BUN 8 - 23 mg/dL 16   Creatinine 0.44 - 1.00 mg/dL 1.03   Sodium 135 - 145 mmol/L 139   Potassium 3.5 - 5.1 mmol/L 3.5   Chloride 98 - 111 mmol/L 104   CO2 22 - 32 mmol/L 30   Calcium 8.9 - 10.3 mg/dL 8.0       Latest Ref Rng & Units 08/12/2021    3:44 AM  CBC  WBC 4.0 - 10.5 K/uL 9.6   Hemoglobin 12.0 - 15.0 g/dL 6.7   Hematocrit 36.0 - 46.0 % 22.2   Platelets 150 - 400 K/uL 260      LABORATORY DATA:  I have reviewed the data as listed Lab Results  Component Value Date   WBC 9.6 08/12/2021   HGB 6.7 (L) 08/12/2021   HCT 22.2 (L) 08/12/2021   MCV 83.8 08/12/2021   PLT 260 08/12/2021   Recent Labs    05/18/21 2115 05/20/21 0433 05/24/21 0702 08/11/21 1700 08/12/21 0344  NA 140   < > 133* 135 139  K 4.3   < > 4.1 4.0 3.5  CL 99   < > 92* 100 104  CO2 32   < > 37* 26 30  GLUCOSE 121*   < > 352* 295* 169*  BUN 15   < > 16 17 16   CREATININE 0.94   < > 1.04* 1.14* 1.03*  CALCIUM 8.6*   < > 8.2* 8.4* 8.0*  GFRNONAA 57*   < > 50* 45* 51*  PROT 6.1*  --  6.2* 6.6  --   ALBUMIN 2.7*  --  2.8* 3.1*  --   AST 15  --  17 14*  --   ALT 8  --  11 8  --   ALKPHOS 63  --  63 67  --   BILITOT 0.3  --  0.5 0.5  --   BILIDIR <0.1  --   --   --   --   IBILI NOT CALCULATED  --   --   --   --    < > = values in this interval not displayed.    Iron/TIBC/Ferritin/ %Sat    Component Value Date/Time   IRON 22 (L) 08/30/2021 1234   IRON 66 08/21/2018 1147   TIBC 385 08/30/2021 1234   FERRITIN 10 (L) 08/30/2021 1234   FERRITIN 31 08/21/2018 1147   IRONPCTSAT 6 (L) 08/30/2021 1234     RADIOGRAPHIC STUDIES: I have personally reviewed the radiological images as listed and agreed with the findings in the report. DG Chest Portable 1 View  Result Date: 08/11/2021 CLINICAL DATA:  weakness, eval for edema EXAM: PORTABLE CHEST 1 VIEW.  Patient is rotated. COMPARISON:  Chest x-ray 05/24/2021, CT chest 05/19/2021. chest x-ray 10/25/2020 FINDINGS:  The heart and mediastinal contours are unchanged. Aortic calcification. No definite focal consolidation. Chronic coarsened interstitial markings with no overt pulmonary edema. No pleural effusion. No pneumothorax. No acute osseous abnormality. IMPRESSION: No active disease in a patient with chronic coarsened interstitial markings. Electronically Signed   By: Iven Finn M.D.   On: 08/11/2021 18:42   CT HEAD WO CONTRAST (5MM)  Result Date: 08/11/2021 CLINICAL DATA:  Mental status change. EXAM: CT HEAD WITHOUT CONTRAST TECHNIQUE: Contiguous axial images were obtained from the base of the skull through the vertex without intravenous contrast. RADIATION DOSE  REDUCTION: This exam was performed according to the departmental dose-optimization program which includes automated exposure control, adjustment of the mA and/or kV according to patient size and/or use of iterative reconstruction technique. COMPARISON:  CT head 05/15/2021 FINDINGS: Brain: Generalized atrophy. Moderate white matter changes consistent with chronic microvascular ischemia. Small chronic infarct left thalamus. Negative for acute infarct, hemorrhage, mass Vascular: Negative for hyperdense vessel Skull: Negative Sinuses/Orbits: Mild mucosal edema right ethmoid sinus. Remaining sinuses clear. Mastoid clear. Bilateral cataract extraction Other: None IMPRESSION: No acute abnormality. Atrophy and moderate chronic microvascular ischemia. Stable from prior CT. Electronically Signed   By: Franchot Gallo M.D.   On: 08/11/2021 18:32       ASSESSMENT & PLAN:  1. Iron deficiency anemia, unspecified iron deficiency anemia type    Iron deficiency anemia.  Check labs today.  Iron lab results are available after today's visit.  Labs reviewed Iron labs are consistent with iron deficiency Patient previously tolerated IV Venofer treatments.  Recommend IV Venofer weekly x5 Patient and son agree with the plan. Further GI work-up deferred by family due to  age. Multiple myeloma panel was obtained and result is pending at the time of dictation.  Follow-up in 3 months with repeat blood work and assessment of need of additional IV Venofer treatments. Orders Placed This Encounter  Procedures   CBC with Differential/Platelet    Standing Status:   Future    Standing Expiration Date:   08/31/2022   Iron and TIBC    Standing Status:   Future    Standing Expiration Date:   08/31/2022   Ferritin    Standing Status:   Future    Standing Expiration Date:   08/31/2022    All questions were answered. The patient knows to call the clinic with any problems questions or concerns.  Cc Leonel Ramsay, MD  Thank you for this kind referral and the opportunity to participate in the care of this patient. A copy of today's note is routed to referring provider   Earlie Server, MD, PhD 08/30/2021

## 2021-08-30 NOTE — Progress Notes (Unsigned)
Pt here for follow up. Pt reports that she had a fall in March, which affected her hearing.

## 2021-08-31 ENCOUNTER — Telehealth: Payer: Self-pay | Admitting: Oncology

## 2021-08-31 ENCOUNTER — Encounter: Payer: Self-pay | Admitting: Oncology

## 2021-08-31 ENCOUNTER — Telehealth: Payer: Self-pay

## 2021-08-31 LAB — KAPPA/LAMBDA LIGHT CHAINS
Kappa free light chain: 65.6 mg/L — ABNORMAL HIGH (ref 3.3–19.4)
Kappa, lambda light chain ratio: 1.98 — ABNORMAL HIGH (ref 0.26–1.65)
Lambda free light chains: 33.2 mg/L — ABNORMAL HIGH (ref 5.7–26.3)

## 2021-08-31 NOTE — Telephone Encounter (Signed)
Please schedule patient for additional IV venofer weekly x 4. Please keep follow up appt as scheduled. Please notify patient of appt.

## 2021-08-31 NOTE — Telephone Encounter (Signed)
Called pt to notify of scheduled appts, Unable to LVM, Mail reminder sent.Marland KitchenKJ

## 2021-08-31 NOTE — Telephone Encounter (Signed)
-----   Message from Rickard Patience, MD sent at 08/31/2021  7:52 AM EDT ----- Lab results are consistent with iron deficiency anemia Please ask her to keep her IV iron appointment this week Please schedule additional IV Venofer weekly x4. Keep current follow-up labs and MD appointment thank you

## 2021-09-01 ENCOUNTER — Inpatient Hospital Stay: Payer: Medicare Other

## 2021-09-01 VITALS — BP 141/45 | HR 71 | Temp 97.2°F | Resp 18

## 2021-09-01 DIAGNOSIS — D509 Iron deficiency anemia, unspecified: Secondary | ICD-10-CM

## 2021-09-01 MED ORDER — SODIUM CHLORIDE 0.9 % IV SOLN: 200.0000 mg | Freq: Once | INTRAVENOUS | Status: DC

## 2021-09-01 MED ORDER — IRON SUCROSE 20 MG/ML IV SOLN
200.0000 mg | Freq: Once | INTRAVENOUS | Status: AC
  Administered 2021-09-01: 200 mg via INTRAVENOUS
  Filled 2021-09-01: qty 10

## 2021-09-01 MED ORDER — SODIUM CHLORIDE 0.9 % IV SOLN
Freq: Once | INTRAVENOUS | Status: AC
  Filled 2021-09-01: qty 250

## 2021-09-01 NOTE — Patient Instructions (Signed)
[facility] [place]  Discharge Instructions: Thank you for choosing [facility] to provide your oncology and hematology care.  If you have a lab appointment with the Cancer Center, please go directly to the Cancer Center and check in at the registration area.  Wear comfortable clothing and clothing appropriate for easy access to any Portacath or PICC line.   We strive to give you quality time with your provider. You may need to reschedule your appointment if you arrive late (15 or more minutes).  Arriving late affects you and other patients whose appointments are after yours.  Also, if you miss three or more appointments without notifying the office, you may be dismissed from the clinic at the provider's discretion.      For prescription refill requests, have your pharmacy contact our office and allow 72 hours for refills to be completed.    Today you received the following chemotherapy and/or immunotherapy agents VENOFER      To help prevent nausea and vomiting after your treatment, we encourage you to take your nausea medication as directed.  BELOW ARE SYMPTOMS THAT SHOULD BE REPORTED IMMEDIATELY: *FEVER GREATER THAN 100.4 F (38 C) OR HIGHER *CHILLS OR SWEATING *NAUSEA AND VOMITING THAT IS NOT CONTROLLED WITH YOUR NAUSEA MEDICATION *UNUSUAL SHORTNESS OF BREATH *UNUSUAL BRUISING OR BLEEDING *URINARY PROBLEMS (pain or burning when urinating, or frequent urination) *BOWEL PROBLEMS (unusual diarrhea, constipation, pain near the anus) TENDERNESS IN MOUTH AND THROAT WITH OR WITHOUT PRESENCE OF ULCERS (sore throat, sores in mouth, or a toothache) UNUSUAL RASH, SWELLING OR PAIN  UNUSUAL VAGINAL DISCHARGE OR ITCHING   Items with * indicate a potential emergency and should be followed up as soon as possible or go to the Emergency Department if any problems should occur.  Please show the CHEMOTHERAPY ALERT CARD or IMMUNOTHERAPY ALERT CARD at check-in to the  Emergency Department and triage nurse.  Should you have questions after your visit or need to cancel or reschedule your appointment, please contact [facility]  [phone] and follow the prompts.  Office hours are 8:00 a.m. to 4:30 p.m. Monday - Friday. Please note that voicemails left after 4:00 p.m. may not be returned until the following business day.  We are closed weekends and major holidays. You have access to a nurse at all times for urgent questions. Please call the main number to the clinic [phone] and follow the prompts.  For any non-urgent questions, you may also contact your provider using MyChart. We now offer e-Visits for anyone 18 and older to request care online for non-urgent symptoms. For details visit [email or web address].   Also download the MyChart app! Go to the app store, search "MyChart", open the app, select [facility], and log in with your MyChart username and password.  Masks are optional in the cancer centers. If you would like for your care team to wear a mask while they are taking care of you, please let them know. For doctor visits, patients may have with them one support person who is at least 86 years old. At this time, visitors are not allowed in the infusion area.   Iron Sucrose Injection What is this medication? IRON SUCROSE (EYE ern SOO krose) treats low levels of iron (iron deficiency anemia) in people with kidney disease. Iron is a mineral that plays an important role in making red blood cells, which carry oxygen from your lungs to the rest of your body. This medicine may   be used for other purposes; ask your health care provider or pharmacist if you have questions. COMMON BRAND NAME(S): Venofer What should I tell my care team before I take this medication? They need to know if you have any of these conditions: Anemia not caused by low iron levels Heart disease High levels of iron in the blood Kidney disease Liver  disease An unusual or allergic reaction to iron, other medications, foods, dyes, or preservatives Pregnant or trying to get pregnant Breast-feeding How should I use this medication? This medication is for infusion into a vein. It is given in a hospital or clinic setting. Talk to your care team about the use of this medication in children. While this medication may be prescribed for children as young as 2 years for selected conditions, precautions do apply. Overdosage: If you think you have taken too much of this medicine contact a poison control center or emergency room at once. NOTE: This medicine is only for you. Do not share this medicine with others. What if I miss a dose? It is important not to miss your dose. Call your care team if you are unable to keep an appointment. What may interact with this medication? Do not take this medication with any of the following: Deferoxamine Dimercaprol Other iron products This medication may also interact with the following: Chloramphenicol Deferasirox This list may not describe all possible interactions. Give your health care provider a list of all the medicines, herbs, non-prescription drugs, or dietary supplements you use. Also tell them if you smoke, drink alcohol, or use illegal drugs. Some items may interact with your medicine. What should I watch for while using this medication? Visit your care team regularly. Tell your care team if your symptoms do not start to get better or if they get worse. You may need blood work done while you are taking this medication. You may need to follow a special diet. Talk to your care team. Foods that contain iron include: whole grains/cereals, dried fruits, beans, or peas, leafy green vegetables, and organ meats (liver, kidney). What side effects may I notice from receiving this medication? Side effects that you should report to your care team as soon as possible: Allergic reactions--skin rash, itching, hives,  swelling of the face, lips, tongue, or throat Low blood pressure--dizziness, feeling faint or lightheaded, blurry vision Shortness of breath Side effects that usually do not require medical attention (report to your care team if they continue or are bothersome): Flushing Headache Joint pain Muscle pain Nausea Pain, redness, or irritation at injection site This list may not describe all possible side effects. Call your doctor for medical advice about side effects. You may report side effects to FDA at 1-800-FDA-1088. Where should I keep my medication? This medication is given in a hospital or clinic and will not be stored at home. NOTE: This sheet is a summary. It may not cover all possible information. If you have questions about this medicine, talk to your doctor, pharmacist, or health care provider.  2023 Elsevier/Gold Standard (2020-07-24 00:00:00)   

## 2021-09-02 LAB — MULTIPLE MYELOMA PANEL, SERUM
Albumin SerPl Elph-Mcnc: 2.9 g/dL (ref 2.9–4.4)
Albumin/Glob SerPl: 1 (ref 0.7–1.7)
Alpha 1: 0.2 g/dL (ref 0.0–0.4)
Alpha2 Glob SerPl Elph-Mcnc: 0.7 g/dL (ref 0.4–1.0)
B-Globulin SerPl Elph-Mcnc: 1.1 g/dL (ref 0.7–1.3)
Gamma Glob SerPl Elph-Mcnc: 1.1 g/dL (ref 0.4–1.8)
Globulin, Total: 3.1 g/dL (ref 2.2–3.9)
IgA: 469 mg/dL — ABNORMAL HIGH (ref 64–422)
IgG (Immunoglobin G), Serum: 1028 mg/dL (ref 586–1602)
IgM (Immunoglobulin M), Srm: 126 mg/dL (ref 26–217)
Total Protein ELP: 6 g/dL (ref 6.0–8.5)

## 2021-09-05 ENCOUNTER — Encounter: Payer: Self-pay | Admitting: Emergency Medicine

## 2021-09-05 ENCOUNTER — Ambulatory Visit
Admission: EM | Admit: 2021-09-05 | Discharge: 2021-09-05 | Disposition: A | Discharge status: Home / Self Care | Payer: Medicare Other | Attending: Physician Assistant | Admitting: Physician Assistant

## 2021-09-05 ENCOUNTER — Ambulatory Visit (INDEPENDENT_AMBULATORY_CARE_PROVIDER_SITE_OTHER): Payer: Medicare Other

## 2021-09-05 DIAGNOSIS — R0602 Shortness of breath: Secondary | ICD-10-CM

## 2021-09-05 DIAGNOSIS — H6123 Impacted cerumen, bilateral: Secondary | ICD-10-CM

## 2021-09-05 DIAGNOSIS — J441 Chronic obstructive pulmonary disease with (acute) exacerbation: Secondary | ICD-10-CM

## 2021-09-05 MED ORDER — IPRATROPIUM-ALBUTEROL 0.5-2.5 (3) MG/3ML IN SOLN
3.0000 mL | Freq: Once | RESPIRATORY_TRACT | Status: AC
  Administered 2021-09-05: 3 mL via RESPIRATORY_TRACT

## 2021-09-05 NOTE — ED Triage Notes (Signed)
Patient c/o bilateral ear fullness for several days.  Patient denies any pain.

## 2021-09-08 ENCOUNTER — Inpatient Hospital Stay: Payer: Medicare Other

## 2021-09-08 VITALS — BP 100/75 | HR 80 | Temp 98.2°F

## 2021-09-08 DIAGNOSIS — D509 Iron deficiency anemia, unspecified: Secondary | ICD-10-CM | POA: Diagnosis not present

## 2021-09-08 MED ORDER — SODIUM CHLORIDE 0.9 % IV SOLN: 200.0000 mg | Freq: Once | INTRAVENOUS | Status: DC

## 2021-09-08 MED ORDER — IRON SUCROSE 20 MG/ML IV SOLN
200.0000 mg | Freq: Once | INTRAVENOUS | Status: AC
  Administered 2021-09-08: 200 mg via INTRAVENOUS
  Filled 2021-09-08: qty 10

## 2021-09-08 MED ORDER — SODIUM CHLORIDE 0.9 % IV SOLN
Freq: Once | INTRAVENOUS | Status: AC
  Filled 2021-09-08: qty 250

## 2021-09-08 NOTE — Patient Instructions (Signed)

## 2021-09-08 NOTE — Progress Notes (Signed)
Patient tolerated Venofer infusion well, no questions/concerns voiced. Patient stable at discharge. AVS given.   ?

## 2021-09-09 ENCOUNTER — Emergency Department: Payer: Medicare Other

## 2021-09-09 ENCOUNTER — Inpatient Hospital Stay
Admission: EM | Admit: 2021-09-09 | Discharge: 2021-09-14 | DRG: 291 | Disposition: A | Discharge status: Skilled Nursing Facility | Payer: Medicare Other | Attending: Internal Medicine | Admitting: Internal Medicine

## 2021-09-09 ENCOUNTER — Encounter: Payer: Self-pay | Admitting: Emergency Medicine

## 2021-09-09 ENCOUNTER — Other Ambulatory Visit: Payer: Self-pay

## 2021-09-09 DIAGNOSIS — I10 Essential (primary) hypertension: Secondary | ICD-10-CM | POA: Diagnosis not present

## 2021-09-09 DIAGNOSIS — E1122 Type 2 diabetes mellitus with diabetic chronic kidney disease: Secondary | ICD-10-CM | POA: Diagnosis present

## 2021-09-09 DIAGNOSIS — E11649 Type 2 diabetes mellitus with hypoglycemia without coma: Secondary | ICD-10-CM | POA: Diagnosis not present

## 2021-09-09 DIAGNOSIS — E785 Hyperlipidemia, unspecified: Secondary | ICD-10-CM | POA: Diagnosis present

## 2021-09-09 DIAGNOSIS — K5900 Constipation, unspecified: Secondary | ICD-10-CM | POA: Diagnosis not present

## 2021-09-09 DIAGNOSIS — Z87891 Personal history of nicotine dependence: Secondary | ICD-10-CM

## 2021-09-09 DIAGNOSIS — E669 Obesity, unspecified: Secondary | ICD-10-CM | POA: Diagnosis present

## 2021-09-09 DIAGNOSIS — K219 Gastro-esophageal reflux disease without esophagitis: Secondary | ICD-10-CM | POA: Diagnosis present

## 2021-09-09 DIAGNOSIS — I130 Hypertensive heart and chronic kidney disease with heart failure and stage 1 through stage 4 chronic kidney disease, or unspecified chronic kidney disease: Secondary | ICD-10-CM | POA: Diagnosis present

## 2021-09-09 DIAGNOSIS — Z7982 Long term (current) use of aspirin: Secondary | ICD-10-CM

## 2021-09-09 DIAGNOSIS — I5033 Acute on chronic diastolic (congestive) heart failure: Secondary | ICD-10-CM | POA: Diagnosis present

## 2021-09-09 DIAGNOSIS — I452 Bifascicular block: Secondary | ICD-10-CM | POA: Diagnosis present

## 2021-09-09 DIAGNOSIS — H9190 Unspecified hearing loss, unspecified ear: Secondary | ICD-10-CM | POA: Diagnosis present

## 2021-09-09 DIAGNOSIS — R55 Syncope and collapse: Secondary | ICD-10-CM | POA: Diagnosis present

## 2021-09-09 DIAGNOSIS — N1831 Chronic kidney disease, stage 3a: Secondary | ICD-10-CM | POA: Diagnosis present

## 2021-09-09 DIAGNOSIS — I440 Atrioventricular block, first degree: Secondary | ICD-10-CM | POA: Diagnosis present

## 2021-09-09 DIAGNOSIS — I5031 Acute diastolic (congestive) heart failure: Secondary | ICD-10-CM | POA: Diagnosis not present

## 2021-09-09 DIAGNOSIS — W1830XA Fall on same level, unspecified, initial encounter: Secondary | ICD-10-CM | POA: Diagnosis present

## 2021-09-09 DIAGNOSIS — D509 Iron deficiency anemia, unspecified: Secondary | ICD-10-CM | POA: Diagnosis not present

## 2021-09-09 DIAGNOSIS — D631 Anemia in chronic kidney disease: Secondary | ICD-10-CM | POA: Diagnosis present

## 2021-09-09 DIAGNOSIS — Z8616 Personal history of COVID-19: Secondary | ICD-10-CM

## 2021-09-09 DIAGNOSIS — H548 Legal blindness, as defined in USA: Secondary | ICD-10-CM | POA: Diagnosis present

## 2021-09-09 DIAGNOSIS — H3530 Unspecified macular degeneration: Secondary | ICD-10-CM | POA: Diagnosis present

## 2021-09-09 DIAGNOSIS — Z66 Do not resuscitate: Secondary | ICD-10-CM | POA: Diagnosis present

## 2021-09-09 DIAGNOSIS — Z7984 Long term (current) use of oral hypoglycemic drugs: Secondary | ICD-10-CM

## 2021-09-09 DIAGNOSIS — S80212A Abrasion, left knee, initial encounter: Secondary | ICD-10-CM | POA: Diagnosis present

## 2021-09-09 DIAGNOSIS — I509 Heart failure, unspecified: Secondary | ICD-10-CM | POA: Diagnosis not present

## 2021-09-09 DIAGNOSIS — Z6830 Body mass index (BMI) 30.0-30.9, adult: Secondary | ICD-10-CM | POA: Diagnosis not present

## 2021-09-09 DIAGNOSIS — Z20822 Contact with and (suspected) exposure to covid-19: Secondary | ICD-10-CM | POA: Diagnosis present

## 2021-09-09 DIAGNOSIS — Z888 Allergy status to other drugs, medicaments and biological substances status: Secondary | ICD-10-CM

## 2021-09-09 DIAGNOSIS — S50312A Abrasion of left elbow, initial encounter: Secondary | ICD-10-CM | POA: Diagnosis present

## 2021-09-09 DIAGNOSIS — E1142 Type 2 diabetes mellitus with diabetic polyneuropathy: Secondary | ICD-10-CM | POA: Diagnosis present

## 2021-09-09 DIAGNOSIS — J449 Chronic obstructive pulmonary disease, unspecified: Secondary | ICD-10-CM | POA: Diagnosis present

## 2021-09-09 DIAGNOSIS — Z794 Long term (current) use of insulin: Secondary | ICD-10-CM

## 2021-09-09 DIAGNOSIS — Z820 Family history of epilepsy and other diseases of the nervous system: Secondary | ICD-10-CM

## 2021-09-09 DIAGNOSIS — Z8673 Personal history of transient ischemic attack (TIA), and cerebral infarction without residual deficits: Secondary | ICD-10-CM

## 2021-09-09 DIAGNOSIS — Z833 Family history of diabetes mellitus: Secondary | ICD-10-CM

## 2021-09-09 DIAGNOSIS — Z79899 Other long term (current) drug therapy: Secondary | ICD-10-CM

## 2021-09-09 DIAGNOSIS — D508 Other iron deficiency anemias: Secondary | ICD-10-CM | POA: Diagnosis not present

## 2021-09-09 LAB — URINALYSIS, ROUTINE W REFLEX MICROSCOPIC
Bilirubin Urine: NEGATIVE
Glucose, UA: NEGATIVE mg/dL
Hgb urine dipstick: NEGATIVE
Ketones, ur: NEGATIVE mg/dL
Leukocytes,Ua: NEGATIVE
Nitrite: NEGATIVE
Protein, ur: 30 mg/dL — AB
Specific Gravity, Urine: 1.026 (ref 1.005–1.030)
pH: 5 (ref 5.0–8.0)

## 2021-09-09 LAB — CBC
HCT: 26.1 % — ABNORMAL LOW (ref 36.0–46.0)
Hemoglobin: 7.2 g/dL — ABNORMAL LOW (ref 12.0–15.0)
MCH: 24.3 pg — ABNORMAL LOW (ref 26.0–34.0)
MCHC: 27.6 g/dL — ABNORMAL LOW (ref 30.0–36.0)
MCV: 88.2 fL (ref 80.0–100.0)
Platelets: 290 10*3/uL (ref 150–400)
RBC: 2.96 MIL/uL — ABNORMAL LOW (ref 3.87–5.11)
RDW: 20.6 % — ABNORMAL HIGH (ref 11.5–15.5)
WBC: 9.6 10*3/uL (ref 4.0–10.5)
nRBC: 0.4 % — ABNORMAL HIGH (ref 0.0–0.2)

## 2021-09-09 LAB — BASIC METABOLIC PANEL
Anion gap: 7 (ref 5–15)
BUN: 19 mg/dL (ref 8–23)
CO2: 29 mmol/L (ref 22–32)
Calcium: 8 mg/dL — ABNORMAL LOW (ref 8.9–10.3)
Chloride: 104 mmol/L (ref 98–111)
Creatinine, Ser: 1.13 mg/dL — ABNORMAL HIGH (ref 0.44–1.00)
GFR, Estimated: 46 mL/min — ABNORMAL LOW (ref >60)
Glucose, Bld: 102 mg/dL — ABNORMAL HIGH (ref 70–99)
Potassium: 4 mmol/L (ref 3.5–5.1)
Sodium: 140 mmol/L (ref 135–145)

## 2021-09-09 LAB — TROPONIN I (HIGH SENSITIVITY)
Troponin I (High Sensitivity): 16 ng/L (ref <18)
Troponin I (High Sensitivity): 15 ng/L (ref <18)

## 2021-09-09 LAB — BRAIN NATRIURETIC PEPTIDE: B Natriuretic Peptide: 955.4 pg/mL — ABNORMAL HIGH (ref 0.0–100.0)

## 2021-09-09 LAB — CBG MONITORING, ED: Glucose-Capillary: 147 mg/dL — ABNORMAL HIGH (ref 70–99)

## 2021-09-09 MED ORDER — ACETAMINOPHEN 325 MG PO TABS
650.0000 mg | ORAL_TABLET | Freq: Four times a day (QID) | ORAL | Status: DC | PRN
  Administered 2021-09-12 – 2021-09-14 (×4): 650 mg via ORAL
  Filled 2021-09-09 (×3): qty 2

## 2021-09-09 MED ORDER — MAGNESIUM HYDROXIDE 400 MG/5ML PO SUSP
30.0000 mL | Freq: Every day | ORAL | Status: DC | PRN
Start: 2021-09-09 — End: 2021-09-14

## 2021-09-09 MED ORDER — ONDANSETRON HCL 4 MG/2ML IJ SOLN: 4.0000 mg | Freq: Four times a day (QID) | INTRAMUSCULAR | Status: DC | PRN

## 2021-09-09 MED ORDER — ASPIRIN 81 MG PO TBEC
81.0000 mg | DELAYED_RELEASE_TABLET | Freq: Every day | ORAL | Status: DC
  Administered 2021-09-10 – 2021-09-14 (×5): 81 mg via ORAL
  Filled 2021-09-09 (×5): qty 1

## 2021-09-09 MED ORDER — IPRATROPIUM-ALBUTEROL 0.5-2.5 (3) MG/3ML IN SOLN
3.0000 mL | Freq: Once | RESPIRATORY_TRACT | Status: AC
  Administered 2021-09-09: 3 mL via RESPIRATORY_TRACT
  Filled 2021-09-09: qty 3

## 2021-09-09 MED ORDER — FUROSEMIDE 10 MG/ML IJ SOLN
40.0000 mg | Freq: Two times a day (BID) | INTRAMUSCULAR | Status: DC
  Administered 2021-09-10 – 2021-09-11 (×3): 40 mg via INTRAVENOUS
  Filled 2021-09-09 (×3): qty 4

## 2021-09-09 MED ORDER — ONDANSETRON HCL 4 MG PO TABS: 8.0000 mg | ORAL_TABLET | Freq: Three times a day (TID) | ORAL | Status: DC | PRN

## 2021-09-09 MED ORDER — TRAZODONE HCL 50 MG PO TABS: 25.0000 mg | ORAL_TABLET | Freq: Every evening | ORAL | Status: DC | PRN

## 2021-09-09 MED ORDER — ONDANSETRON HCL 4 MG PO TABS: 4.0000 mg | ORAL_TABLET | Freq: Four times a day (QID) | ORAL | Status: DC | PRN

## 2021-09-09 MED ORDER — METOPROLOL TARTRATE 25 MG PO TABS
12.5000 mg | ORAL_TABLET | Freq: Two times a day (BID) | ORAL | Status: DC
  Administered 2021-09-09 – 2021-09-14 (×10): 12.5 mg via ORAL
  Filled 2021-09-09 (×10): qty 1

## 2021-09-09 MED ORDER — ENOXAPARIN SODIUM 30 MG/0.3ML IJ SOSY
30.0000 mg | PREFILLED_SYRINGE | INTRAMUSCULAR | Status: DC
  Administered 2021-09-10 – 2021-09-11 (×2): 30 mg via SUBCUTANEOUS
  Filled 2021-09-09 (×2): qty 0.3

## 2021-09-09 MED ORDER — VITAMIN D3 25 MCG (1000 UNIT) PO TABS
5000.0000 [IU] | ORAL_TABLET | Freq: Every day | ORAL | Status: DC
  Administered 2021-09-10 – 2021-09-14 (×5): 5000 [IU] via ORAL
  Filled 2021-09-09 (×9): qty 5

## 2021-09-09 MED ORDER — TRAZODONE HCL 50 MG PO TABS
50.0000 mg | ORAL_TABLET | Freq: Every day | ORAL | Status: DC
  Administered 2021-09-09 – 2021-09-13 (×5): 50 mg via ORAL
  Filled 2021-09-09 (×5): qty 1

## 2021-09-09 MED ORDER — FERROUS SULFATE 325 (65 FE) MG PO TABS
325.0000 mg | ORAL_TABLET | Freq: Every day | ORAL | Status: DC
  Administered 2021-09-10: 325 mg via ORAL
  Filled 2021-09-09: qty 1

## 2021-09-09 MED ORDER — ATORVASTATIN CALCIUM 80 MG PO TABS
80.0000 mg | ORAL_TABLET | Freq: Every day | ORAL | Status: DC
  Administered 2021-09-09 – 2021-09-13 (×5): 80 mg via ORAL
  Filled 2021-09-09: qty 1
  Filled 2021-09-09: qty 4
  Filled 2021-09-09 (×3): qty 1

## 2021-09-09 MED ORDER — ENOXAPARIN SODIUM 40 MG/0.4ML IJ SOSY: 40.0000 mg | PREFILLED_SYRINGE | INTRAMUSCULAR | Status: DC

## 2021-09-09 MED ORDER — GABAPENTIN 300 MG PO CAPS
300.0000 mg | ORAL_CAPSULE | Freq: Every day | ORAL | Status: DC
  Administered 2021-09-09 – 2021-09-14 (×6): 300 mg via ORAL
  Filled 2021-09-09 (×7): qty 1

## 2021-09-09 MED ORDER — ACETAMINOPHEN 650 MG RE SUPP: 650.0000 mg | Freq: Four times a day (QID) | RECTAL | Status: DC | PRN

## 2021-09-09 MED ORDER — INSULIN GLARGINE-YFGN 100 UNIT/ML ~~LOC~~ SOLN
44.0000 [IU] | Freq: Every day | SUBCUTANEOUS | Status: DC
  Administered 2021-09-09 – 2021-09-10 (×2): 44 [IU] via SUBCUTANEOUS
  Filled 2021-09-09 (×2): qty 0.44

## 2021-09-09 MED ORDER — INSULIN ASPART 100 UNIT/ML IJ SOLN
0.0000 [IU] | Freq: Three times a day (TID) | INTRAMUSCULAR | Status: DC
  Administered 2021-09-11 (×3): 2 [IU] via SUBCUTANEOUS
  Administered 2021-09-12 (×2): 1 [IU] via SUBCUTANEOUS
  Administered 2021-09-12: 2 [IU] via SUBCUTANEOUS
  Administered 2021-09-13: 1 [IU] via SUBCUTANEOUS
  Administered 2021-09-13: 3 [IU] via SUBCUTANEOUS
  Administered 2021-09-13: 1 [IU] via SUBCUTANEOUS
  Administered 2021-09-13: 3 [IU] via SUBCUTANEOUS
  Administered 2021-09-14 (×2): 2 [IU] via SUBCUTANEOUS
  Filled 2021-09-09 (×12): qty 1

## 2021-09-09 MED ORDER — FUROSEMIDE 10 MG/ML IJ SOLN
20.0000 mg | Freq: Once | INTRAMUSCULAR | Status: AC
  Administered 2021-09-09: 20 mg via INTRAVENOUS
  Filled 2021-09-09: qty 4

## 2021-09-09 NOTE — Progress Notes (Signed)
PHARMACIST - PHYSICIAN COMMUNICATION  CONCERNING:  Enoxaparin (Lovenox) for DVT Prophylaxis    RECOMMENDATION: Patient was prescribed enoxaprin 40mg  q24 hours for VTE prophylaxis.   Filed Weights   09/09/21 1143  Weight: 72 kg (158 lb 11.7 oz)    Body mass index is 29.03 kg/m.  Estimated Creatinine Clearance: 29.5 mL/min (A) (by C-G formula based on SCr of 1.13 mg/dL (H)).   Patient is candidate for enoxaparin 30mg  every 24 hours based on CrCl <16ml/min  DESCRIPTION: Pharmacy has adjusted enoxaparin dose per [facility] policy.  Patient is now receiving enoxaparin 30 mg every 24 hours    31m, PharmD Clinical Pharmacist  09/09/2021 7:58 PM

## 2021-09-09 NOTE — ED Triage Notes (Signed)
Pt states she was on the way to her PCP when she had a syncopal event. Pt endorses frequent falls. Denies any pain at this time.

## 2021-09-09 NOTE — ED Provider Notes (Signed)
[facility] Provider Note    Event Date/Time   First MD Initiated Contact with Patient 09/09/21 1335     (approximate)   History   Shortness of Breath and Loss of Consciousness   HPI  Judy Harvey is a 88 y.o. female with history of hypertension, hyperlipidemia, GERD, diabetes, CVA, and anemia presents to the emergency department for treatment and evaluation after having a syncopal episode.  Episode lasted less than a minute.  She denies current headache, confusion, or visual changes.  She denies chest pain but states that she has been more short of breath as of late and has had to put 2 pillows on her bed at night because she is now unable to lie flat.  She lives at home with her son.  She had an unwitnessed fall at home 2 days ago.  She states that she did hit her head and has had left wrist and left knee pain since that time.  Over the past couple days she has also noted bilateral leg swelling.  She has never experienced this in the past.   Past Medical History:  Diagnosis Date   Anemia    Depression    Diabetes mellitus without complication (HCC)    GERD (gastroesophageal reflux disease)    History of COVID-19    Hyperlipidemia    Hypertension    Legally blind    Macular degeneration, bilateral    Peripheral neuropathy    Post herpetic neuralgia    located in her back   Stroke [facility])      Physical Exam   Triage Vital Signs: ED Triage Vitals  Enc Vitals Group     BP 09/09/21 1144 (!) 164/123     Pulse Rate 09/09/21 1144 64     Resp 09/09/21 1144 20     Temp 09/09/21 1144 98 F (36.7 C)     Temp src --      SpO2 09/09/21 1144 95 %     Weight 09/09/21 1143 158 lb 11.7 oz (72 kg)     Height 09/09/21 1143 5\' 2"  (1.575 m)     Head Circumference --      Peak Flow --      Pain Score 09/09/21 1142 0     Pain Loc --      Pain Edu? --      Excl. in GC? --     Most recent vital signs: Vitals:   09/09/21 1730 09/09/21 1800  BP: (!)  121/57 116/89  Pulse: 64 69  Resp: 20 19  Temp:    SpO2: 100% 98%    General: Awake, no distress.  CV:  Good peripheral perfusion.  2+ pitting edema bilateral lower extremities. Resp:  Normal effort.  Crackles in the bases.  Wheezing bilateral upper lobes. Abd:  No distention.  Other:  Swelling over the distal radius and ulna.  Abrasion over the left knee with ecchymosis and mild swelling.   ED Results / Procedures / Treatments   Labs (all labs ordered are listed, but only abnormal results are displayed) Labs Reviewed  BASIC METABOLIC PANEL - Abnormal; Notable for the following components:      Result Value   Glucose, Bld 102 (*)    Creatinine, Ser 1.13 (*)    Calcium 8.0 (*)    GFR, Estimated 46 (*)    All other components within normal limits  CBC - Abnormal; Notable for the following components:   RBC 2.96 (*)  Hemoglobin 7.2 (*)    HCT 26.1 (*)    MCH 24.3 (*)    MCHC 27.6 (*)    RDW 20.6 (*)    nRBC 0.4 (*)    All other components within normal limits  URINALYSIS, ROUTINE W REFLEX MICROSCOPIC - Abnormal; Notable for the following components:   Color, Urine AMBER (*)    APPearance CLOUDY (*)    Protein, ur 30 (*)    Bacteria, UA FEW (*)    All other components within normal limits  BRAIN NATRIURETIC PEPTIDE - Abnormal; Notable for the following components:   B Natriuretic Peptide 955.4 (*)    All other components within normal limits  BASIC METABOLIC PANEL  CBC  HEMOGLOBIN A1C  CBG MONITORING, ED  TROPONIN I (HIGH SENSITIVITY)  TROPONIN I (HIGH SENSITIVITY)     EKG  Sinus rhythm with first-degree A-V block, no ectopy, rate of 62.   RADIOLOGY  CT head and cervical spine negative for acute concerns.  X-ray of the left knee without acute bony abnormality.  X-ray of the left wrist negative for acute bony abnormality.  I have independently reviewed and interpreted imaging as well as reviewed report from radiology.  PROCEDURES:  Critical Care  performed: No  Procedures   MEDICATIONS ORDERED IN ED:  Medications  aspirin EC tablet 81 mg (has no administration in time range)  atorvastatin (LIPITOR) tablet 80 mg (has no administration in time range)  metoprolol tartrate (LOPRESSOR) tablet 12.5 mg (has no administration in time range)  traZODone (DESYREL) tablet 50 mg (has no administration in time range)  insulin glargine-yfgn (SEMGLEE) injection 44 Units (has no administration in time range)  ondansetron (ZOFRAN) tablet 8 mg (has no administration in time range)  ferrous sulfate tablet 325 mg (has no administration in time range)  gabapentin (NEURONTIN) capsule 300 mg (has no administration in time range)  Vitamin D3 CAPS 5,000 Units (has no administration in time range)  furosemide (LASIX) injection 40 mg (has no administration in time range)  enoxaparin (LOVENOX) injection 40 mg (has no administration in time range)  acetaminophen (TYLENOL) tablet 650 mg (has no administration in time range)    Or  acetaminophen (TYLENOL) suppository 650 mg (has no administration in time range)  traZODone (DESYREL) tablet 25 mg (has no administration in time range)  magnesium hydroxide (MILK OF MAGNESIA) suspension 30 mL (has no administration in time range)  ondansetron (ZOFRAN) tablet 4 mg (has no administration in time range)    Or  ondansetron (ZOFRAN) injection 4 mg (has no administration in time range)  insulin aspart (novoLOG) injection 0-9 Units (has no administration in time range)  furosemide (LASIX) injection 20 mg (20 mg Intravenous Given 09/09/21 1618)  ipratropium-albuterol (DUONEB) 0.5-2.5 (3) MG/3ML nebulizer solution 3 mL (3 mLs Nebulization Given 09/09/21 1618)     IMPRESSION / MDM / ASSESSMENT AND PLAN / ED COURSE   I reviewed the triage vital signs and the nursing notes.  Differential diagnosis includes, but is not limited to: Intracranial hemorrhage, cervical vertebral fracture, wrist fracture, patella fracture,  contusion, COPD exacerbation, congestive heart failure, cardiac event, CVA, TIA.  Patient's presentation is most consistent with acute presentation with potential threat to life or bodily function.  The patient is on the cardiac monitor to evaluate for evidence of arrhythmia and/or significant heart rate changes.  88 year old female presenting to the emergency department for treatment and evaluation after a brief syncopal episode while she was in route to her primary care provider's  office today.  See HPI for further details.  On exam, she does have some edema and bruising over the left wrist and an abrasion and bruising with some swelling of the left knee.  We will get images of those as well as a CT of her head and cervical spine.  At this time she is awake, alert, and oriented.  She also has bilateral 2+ pitting edema of the lower extremities which she says is new and has never been present in the past.  BNP is elevated.  Chest x-ray shows mild cardiomegaly.  20 of Lasix ordered.  Patient is not on Lasix at home.  Plan will be to admit patient for monitoring after syncopal episode with a fall 2 days ago and new onset CHF.  Hospitalist consult requested.      FINAL CLINICAL IMPRESSION(S) / ED DIAGNOSES   Final diagnoses:  Acute congestive heart failure, unspecified heart failure type (HCC)  Syncope, unspecified syncope type     Rx / DC Orders   ED Discharge Orders     None        Note:  This document was prepared using Dragon voice recognition software and may include unintentional dictation errors.   Chinita Pester, FNP 09/09/21 1939    Sharman Cheek, MD 09/10/21 [phone]

## 2021-09-09 NOTE — H&P (Addendum)
[place]   PATIENT NAME: Judy Harvey    MR#:  [id number]  DATE OF BIRTH:  03/09/1928  DATE OF ADMISSION:  09/09/2021  PRIMARY CARE PHYSICIAN: Mick Sell, MD   Patient is coming from: Home  REQUESTING/REFERRING PHYSICIAN: Tye Maryland, PA-C  CHIEF COMPLAINT:   Chief Complaint  Patient presents with   Shortness of Breath   Loss of Consciousness    HISTORY OF PRESENT ILLNESS:  Judy Harvey is a 88 y.o. Caucasian female with medical history significant for type diabetes mellitus, depression, GERD, COPD, hypertension, dyslipidemia and CVA, who presented to the emergency room with a [place] of syncope as well as recent worsening dyspnea, lower extremity edema as well as 3 pillow orthopnea and paroxysmal nocturnal dyspnea.  She admitted to mild cough and wheezing without dyspnea.  She had an unwitnessed accidental mechanical fall a couple of days ago and hit her head without syncope.  Today on her way to her primary care physician she had a brief syncopal episode.  She was having left wrist and knee pain after her fall.  No chest pain or palpitations.  No nausea or vomiting or abdominal pain.  No dysuria, oliguria or hematuria or flank pain.  She is on home O2 at 2 L/min at baseline for her COPD.  ED Course: When she came to the ER, vital signs were within normal.  BMP was unremarkable.  BN P was 1055.4 and high-sensitivity troponin I 16 and later 15.  CBC showed anemia better than previous levels with H&H of 7.2 and 26.1.  UA showed 6-10 WBCs and few bacteria with 30 protein. EKG as reviewed by me : Showed normal sinus rhythm with a rate of 62 with first-degree AV block and PACs, right bundle branch block and left anterior fascicular block with bifascicular block and minimal voltage criteria for LVH. Imaging: Left knee and wrist x-rays were negative for fracture as mentioned below.  Noncontrasted head CT scan revealed no acute intracranial normalities.  C-spine  CT showed no acute abnormalities.  It showed left pleural effusion and degenerative spondylosis and facet arthropathy.  Two-view chest x-ray showed aortic atherosclerosis, cardiomegaly without edema and hazy density in the lung bases is likely from layering pleural effusions.  The patient was given 20 mg of IV Lasix and DuoNebs.  He will be admitted to a cardiac telemetry bed for further evaluation and management. PAST MEDICAL HISTORY:   Past Medical History:  Diagnosis Date   Anemia    Depression    Diabetes mellitus without complication (HCC)    GERD (gastroesophageal reflux disease)    History of COVID-19    Hyperlipidemia    Hypertension    Legally blind    Macular degeneration, bilateral    Peripheral neuropathy    Post herpetic neuralgia    located in her back   Stroke [facility])     PAST SURGICAL HISTORY:   Past Surgical History:  Procedure Laterality Date   ABDOMINAL HYSTERECTOMY     partial   APPENDECTOMY     BREAST SURGERY     reduction   CAROTID PTA/STENT INTERVENTION Left 10/18/2017   Procedure: CAROTID PTA/STENT INTERVENTION;  Surgeon: Annice Needy, MD;  Location: [facility] INVASIVE CV LAB;  Service: Cardiovascular;  Laterality: Left;   CATARACT EXTRACTION, BILATERAL     CESAREAN SECTION     x3   COLONOSCOPY     ESOPHAGEAL DILATION      SOCIAL HISTORY:  Social History   Tobacco Use   Smoking status: Former    Packs/day: 3.00    Years: 50.00    Total pack years: 150.00    Types: Cigarettes    Quit date: 2008    Years since quitting: 15.5   Smokeless tobacco: Never   Tobacco comments:    smoking cessation materials not required  Substance Use Topics   Alcohol use: No    FAMILY HISTORY:   Family History  Problem Relation Age of Onset   Healthy Mother    Diabetes Father    Alzheimer's disease Father    Multiple sclerosis Sister    Multiple sclerosis Brother     DRUG ALLERGIES:   Allergies  Allergen Reactions   Adhesive [Tape] Other (See  Comments)    "pulls my skin off" paper tape ok   Lyrica [Pregabalin]     Body spasms    REVIEW OF SYSTEMS:   ROS As per history of present illness. All pertinent systems were reviewed above. Constitutional, HEENT, cardiovascular, respiratory, GI, GU, musculoskeletal, neuro, psychiatric, endocrine, integumentary and hematologic systems were reviewed and are otherwise negative/unremarkable except for positive findings mentioned above in the HPI.   MEDICATIONS AT HOME:   Prior to Admission medications   Medication Sig Start Date End Date Taking? Authorizing Provider  acetaminophen (TYLENOL) 325 MG tablet Take 650 mg by mouth every 6 (six) hours as needed for mild pain or fever.    [provider]  albuterol (ACCUNEB) 1.25 MG/3ML nebulizer solution Take 3 mLs by nebulization every 6 (six) hours as needed for wheezing or shortness of breath. Patient not taking: Reported on 08/30/2021 11/12/19   [provider]  albuterol (VENTOLIN HFA) 108 (90 Base) MCG/ACT inhaler Inhale 2 puffs into the lungs every 6 (six) hours as needed for shortness of breath or wheezing. Patient not taking: Reported on 08/30/2021 11/06/19   [provider]  aspirin EC 81 MG tablet Take 81 mg by mouth daily.    [provider]  atorvastatin (LIPITOR) 80 MG tablet Take 1 tablet (80 mg total) by mouth at bedtime. 05/22/21   Hongalgi, Lenis Dickinson, MD  Cholecalciferol (VITAMIN D3) 5000 units CAPS Take 1 capsule (5,000 Units total) by mouth daily. 06/28/16   Plonk, Gwyndolyn Saxon, MD  DULoxetine (CYMBALTA) 30 MG capsule TAKE 1 CAPSULE BY MOUTH EVERY DAY Patient not taking: Reported on 08/30/2021 08/21/18   Juline Patch, MD  ferrous sulfate 325 (65 FE) MG tablet Take 325 mg by mouth daily.    [provider]  gabapentin (NEURONTIN) 300 MG capsule Take 300 mg by mouth daily.    [provider]  insulin glargine (LANTUS) 100 UNIT/ML injection Inject 0.1 mLs (10 Units total) into the skin at  bedtime. Patient taking differently: Inject 44 Units into the skin at bedtime. 05/25/21   Lorella Nimrod, MD  Melatonin 3 MG CAPS Take 3 mg by mouth at bedtime. Patient not taking: Reported on 08/30/2021    [provider]  metFORMIN (GLUCOPHAGE-XR) 500 MG 24 hr tablet Take 500 mg by mouth 2 (two) times daily. 03/23/21   [provider]  metoprolol tartrate (LOPRESSOR) 25 MG tablet Take 0.5 tablets (12.5 mg total) by mouth 2 (two) times daily. 05/22/21   Hongalgi, Lenis Dickinson, MD  ondansetron (ZOFRAN) 8 MG tablet Take 8 mg by mouth every 8 (eight) hours as needed for nausea or vomiting.     [provider]  pantoprazole (PROTONIX) 40 MG tablet Take  40 mg by mouth 2 (two) times daily. Patient not taking: Reported on 08/30/2021    [provider]  polyethylene glycol (MIRALAX / GLYCOLAX) 17 g packet Take 17 g by mouth daily. Patient not taking: Reported on 08/03/2021 05/23/21   Elease EtienneHongalgi, Anand D, MD  traZODone (DESYREL) 50 MG tablet Take 50 mg by mouth at bedtime. 05/04/21   [provider]      VITAL SIGNS:  Blood pressure (!) 115/45, pulse (!) 59, temperature 98 F (36.7 C), resp. rate 19, height 5\' 2"  (1.575 m), weight 72 kg, SpO2 96 %.  PHYSICAL EXAMINATION:  Physical Exam  GENERAL:  88 y.o.-year-old Caucasian female patient lying in the bed with no acute distress.  EYES: Pupils equal, round, reactive to light and accommodation. No scleral icterus. Extraocular muscles intact.  HEENT: Head atraumatic, normocephalic. Oropharynx and nasopharynx clear.  NECK:  Supple, no jugular venous distention. No thyroid enlargement, no tenderness.  LUNGS: Slightly diminished bibasilar breath sounds with bibasal rales.  No use of accessory muscles of respiration.  CARDIOVASCULAR: Regular rate and rhythm, S1, S2 normal. No murmurs, rubs, or gallops.  ABDOMEN: Soft, nondistended, nontender. Bowel sounds present. No organomegaly or mass.  EXTREMITIES: 1-2+ bilateral lower  extremity pitting edema with no cyanosis, or clubbing.  NEUROLOGIC: Cranial nerves II through XII are intact. Muscle strength 5/5 in all extremities. Sensation intact. Gait not checked.  PSYCHIATRIC: The patient is alert and oriented x 3.  Normal affect and good eye contact. SKIN: No obvious rash, lesion, or ulcer.   LABORATORY PANEL:   CBC Recent Labs  Lab 09/09/21 1207  WBC 9.6  HGB 7.2*  HCT 26.1*  PLT 290   ------------------------------------------------------------------------------------------------------------------  Chemistries  Recent Labs  Lab 09/09/21 1207  NA 140  K 4.0  CL 104  CO2 29  GLUCOSE 102*  BUN 19  CREATININE 1.13*  CALCIUM 8.0*   ------------------------------------------------------------------------------------------------------------------  Cardiac Enzymes No results for input(s): "TROPONINI" in the last 168 hours. ------------------------------------------------------------------------------------------------------------------  RADIOLOGY:  DG Chest 1 View  Result Date: 09/09/2021 CLINICAL DATA:  Syncope.  Frequent falls.  Peripheral edema. EXAM: CHEST  1 VIEW COMPARISON:  09/05/2021 FINDINGS: The patient is rotated to the right on today's radiograph, reducing diagnostic sensitivity and specificity. Retrocardiac density favoring moderate-sized hiatal hernia. Bifid distal right fourth rib. Atherosclerotic calcification of the aortic arch. Suspected mild enlargement of the cardiopericardial silhouette, without overt edema. Vaguely accentuated density at the lung bases without obscuration of the diaphragms, probably from soft tissues of the chest wall, less likely from layering pleural effusions. IMPRESSION: 1. Mild enlargement of the cardiopericardial silhouette, without edema. 2.  Aortic Atherosclerosis (ICD10-I70.0). 3. Hazy density at the lung bases without obscuration of the diaphragms, probably due to soft tissues of the chest wall/body habitus,  less likely from layering pleural effusions. 4. Bifid right anterior fourth rib. Electronically Signed   By: Gaylyn RongWalter  Liebkemann M.D.   On: 09/09/2021 17:00   CT Cervical Spine Wo Contrast  Result Date: 09/09/2021 CLINICAL DATA:  Syncopal of ad. Fall with trauma to the head and neck. EXAM: CT CERVICAL SPINE WITHOUT CONTRAST TECHNIQUE: Multidetector CT imaging of the cervical spine was performed without intravenous contrast. Multiplanar CT image reconstructions were also generated. RADIATION DOSE REDUCTION: This exam was performed according to the departmental dose-optimization program which includes automated exposure control, adjustment of the mA and/or kV according to patient size and/or use of iterative reconstruction technique. COMPARISON:  None FINDINGS: Alignment: No malalignment. Skull base and vertebrae: No  evidence of regional fracture or focal bone lesion. Soft tissues and spinal canal: No traumatic soft tissue finding. Disc levels: Chronic osteoarthritis at the C1-2 articulation. Chronic degenerative spondylosis from C3-4 through C6-7. Chronic facet osteoarthritis on the left at C2-3 and C3-4. No compressive bony canal stenosis. Mild bony foraminal narrowing. Upper chest: Negative other than a left pleural effusion. Other: None IMPRESSION: No acute or traumatic finding. Ordinary degenerative spondylosis and facet arthritis. Left pleural effusion. Electronically Signed   By: Nelson Chimes M.D.   On: 09/09/2021 15:48   CT Head Wo Contrast  Result Date: 09/09/2021 CLINICAL DATA:  Syncopal event.  Frequent falling. EXAM: CT HEAD WITHOUT CONTRAST TECHNIQUE: Contiguous axial images were obtained from the base of the skull through the vertex without intravenous contrast. RADIATION DOSE REDUCTION: This exam was performed according to the departmental dose-optimization program which includes automated exposure control, adjustment of the mA and/or kV according to patient size and/or use of iterative  reconstruction technique. COMPARISON:  08/11/2021 FINDINGS: Brain: Generalized age related atrophy. Moderate chronic small-vessel ischemic changes of the cerebral hemispheric white matter. No sign of acute infarction, mass lesion, hemorrhage, hydrocephalus or extra-axial collection. Vascular: There is atherosclerotic calcification of the major vessels at the base of the brain. Skull: No skull fracture. Sinuses/Orbits: Sinuses are clear.  Orbits are normal. Other: None IMPRESSION: No acute or traumatic finding. Age related atrophy. Chronic small-vessel ischemic changes of the cerebral hemispheric white matter. Electronically Signed   By: Nelson Chimes M.D.   On: 09/09/2021 15:46   DG Knee Complete 4 Views Left  Result Date: 09/09/2021 CLINICAL DATA:  Trauma, fall, pain EXAM: LEFT KNEE - COMPLETE 4+ VIEW COMPARISON:  None Available. FINDINGS: No recent fracture or dislocation is seen. There is no significant effusion in the suprapatellar bursa. Degenerative changes are noted with bony spurs in medial, lateral and patellofemoral compartments. Arterial calcifications are seen in the soft tissues. IMPRESSION: No recent fracture or dislocation is seen in the left knee. Degenerative changes are noted with bony spurs. There is no significant effusion. Electronically Signed   By: Elmer Picker M.D.   On: 09/09/2021 15:18   DG Wrist Complete Left  Result Date: 09/09/2021 CLINICAL DATA:  Trauma, fall, pain EXAM: LEFT WRIST - COMPLETE 3+ VIEW COMPARISON:  None Available. FINDINGS: No recent fracture or dislocation is seen. Severe degenerative changes are noted in first carpometacarpal joint. IMPRESSION: No recent fracture or dislocation is seen in the left wrist. Severe degenerative changes are noted in first carpometacarpal joint. Electronically Signed   By: Elmer Picker M.D.   On: 09/09/2021 15:17      IMPRESSION AND PLAN:  Assessment and Plan: * Acute CHF (congestive heart failure) ([place]) - She will  be admitted to a cardiac telemetry bed. - This is likely a new onset and I suspect diastolic etiology. - She will be diuresed with IV Lasix. - We will follow serial troponins. - She had a 2D echo on 05/19/2021 that revealed EF of 55 to 60% with no comment on diastolic function.  Syncope, vasovagal - We will check orthostatics. - We will monitor for arrhythmias and hypoglycemia with her diabetes mellitus. - Differential diagnosis would include cardiogenic, neurally mediated, Orthostatic and hypoglycemia.  Type 2 diabetes mellitus with peripheral neuropathy (HCC) - We will place on supplement coverage with NovoLog and continue basal coverage. - We will continue Neurontin and hold off metformin.  Benign essential HTN - We will continue her antihypertensives  Hyperlipidemia - We will  continue statin therapy.  GERD without esophagitis - We will continue PPI therapy.   DVT prophylaxis: Lovenox.  Advanced Care Planning:  Code Status: She is DNR/DNI.  This was discussed with her.  Family Communication:  The plan of care was discussed in details with the patient (and family). I answered all questions. The patient agreed to proceed with the above mentioned plan. Further management will depend upon hospital course. Disposition Plan: Back to previous home environment Consults called: none.  All the records are reviewed and case discussed with ED provider.  Status is: Inpatient  At the time of the admission, it appears that the appropriate admission status for this patient is inpatient.  This is judged to be reasonable and necessary in order to provide the required intensity of service to ensure the patient's safety given the presenting symptoms, physical exam findings and initial radiographic and laboratory data in the context of comorbid conditions.  The patient requires inpatient status due to high intensity of service, high risk of further deterioration and high frequency of surveillance  required.  I certify that at the time of admission, it is my clinical judgment that the patient will require inpatient hospital care extending more than 2 midnights.                            Dispo: The patient is from: Home              Anticipated d/c is to: Home              Patient currently is not medically stable to d/c.              Difficult to place patient: No  Hannah Beat M.D on 09/10/2021 at 4:16 AM  [facility]   From 7 PM-7 AM, contact night-coverage [email or web address]  CC: Primary care physician; Mick Sell, MD

## 2021-09-10 DIAGNOSIS — R55 Syncope and collapse: Secondary | ICD-10-CM | POA: Diagnosis not present

## 2021-09-10 DIAGNOSIS — I5031 Acute diastolic (congestive) heart failure: Secondary | ICD-10-CM

## 2021-09-10 DIAGNOSIS — D509 Iron deficiency anemia, unspecified: Secondary | ICD-10-CM | POA: Diagnosis not present

## 2021-09-10 DIAGNOSIS — K219 Gastro-esophageal reflux disease without esophagitis: Secondary | ICD-10-CM

## 2021-09-10 DIAGNOSIS — E1142 Type 2 diabetes mellitus with diabetic polyneuropathy: Secondary | ICD-10-CM

## 2021-09-10 LAB — CBC
HCT: 26.5 % — ABNORMAL LOW (ref 36.0–46.0)
Hemoglobin: 7.3 g/dL — ABNORMAL LOW (ref 12.0–15.0)
MCH: 24.9 pg — ABNORMAL LOW (ref 26.0–34.0)
MCHC: 27.5 g/dL — ABNORMAL LOW (ref 30.0–36.0)
MCV: 90.4 fL (ref 80.0–100.0)
Platelets: 266 10*3/uL (ref 150–400)
RBC: 2.93 MIL/uL — ABNORMAL LOW (ref 3.87–5.11)
RDW: 21 % — ABNORMAL HIGH (ref 11.5–15.5)
WBC: 8.5 10*3/uL (ref 4.0–10.5)
nRBC: 0.4 % — ABNORMAL HIGH (ref 0.0–0.2)

## 2021-09-10 LAB — GLUCOSE, CAPILLARY: Glucose-Capillary: 98 mg/dL (ref 70–99)

## 2021-09-10 LAB — CBG MONITORING, ED
Glucose-Capillary: 58 mg/dL — ABNORMAL LOW (ref 70–99)
Glucose-Capillary: 70 mg/dL (ref 70–99)
Glucose-Capillary: 119 mg/dL — ABNORMAL HIGH (ref 70–99)
Glucose-Capillary: 118 mg/dL — ABNORMAL HIGH (ref 70–99)

## 2021-09-10 LAB — BASIC METABOLIC PANEL
Anion gap: 8 (ref 5–15)
BUN: 19 mg/dL (ref 8–23)
CO2: 29 mmol/L (ref 22–32)
Calcium: 7.9 mg/dL — ABNORMAL LOW (ref 8.9–10.3)
Chloride: 105 mmol/L (ref 98–111)
Creatinine, Ser: 1.14 mg/dL — ABNORMAL HIGH (ref 0.44–1.00)
GFR, Estimated: 45 mL/min — ABNORMAL LOW (ref >60)
Glucose, Bld: 93 mg/dL (ref 70–99)
Potassium: 4.2 mmol/L (ref 3.5–5.1)
Sodium: 142 mmol/L (ref 135–145)

## 2021-09-10 MED ORDER — ALPRAZOLAM 0.25 MG PO TABS
0.2500 mg | ORAL_TABLET | Freq: Two times a day (BID) | ORAL | Status: DC | PRN
  Administered 2021-09-10 – 2021-09-14 (×7): 0.25 mg via ORAL
  Filled 2021-09-10 (×7): qty 1

## 2021-09-10 MED ORDER — SODIUM CHLORIDE 0.9 % IV SOLN
150.0000 mg | INTRAVENOUS | Status: AC
  Administered 2021-09-11 – 2021-09-12 (×2): 150 mg via INTRAVENOUS
  Filled 2021-09-10 (×3): qty 7.5

## 2021-09-10 NOTE — Assessment & Plan Note (Signed)
-   She will be admitted to a cardiac telemetry bed. - This is likely a new onset and I suspect diastolic etiology. - She will be diuresed with IV Lasix. - We will follow serial troponins. - She had a 2D echo on 05/19/2021 that revealed EF of 55 to 60% with no comment on diastolic function.

## 2021-09-10 NOTE — Assessment & Plan Note (Signed)
-   We will continue PPI therapy 

## 2021-09-10 NOTE — Assessment & Plan Note (Signed)
-   We will continue statin therapy. 

## 2021-09-10 NOTE — Assessment & Plan Note (Signed)
-   We will check orthostatics. - We will monitor for arrhythmias and hypoglycemia with her diabetes mellitus. - Differential diagnosis would include cardiogenic, neurally mediated, Orthostatic and hypoglycemia.

## 2021-09-10 NOTE — Progress Notes (Signed)
PROGRESS NOTE    Judy Harvey  RDE:[id number] DOB: 1928/03/13 DOA: 09/09/2021 PCP: Mick Sell, MD   Assessment & Plan:   Principal Problem:   Acute CHF (congestive heart failure) (HCC) Active Problems:   Syncope, vasovagal   Type 2 diabetes mellitus with peripheral neuropathy (HCC)   Benign essential HTN   Hyperlipidemia   GERD without esophagitis  Assessment and Plan: Acute on chronic diastolic CHF: as per echo 05/15/21. Continue on IV lasix. Monitor I/Os. Need CHF education & CHF coordinator notified    Vasovagal syncope: orthostatics ordered but not done yet. Will discuss w/ nurse   IDA: H&H are labile. Will transfuse if Hb < 7.0. Will give IV iron.  DM2: likely poorly controlled. Continue on glargine, SSI w/ accuchecks  HTN: continue on metoprolol   HLD: continue on statin    GERD: continue on PPI        DVT prophylaxis: lovenox  Code Status: full  Family Communication: called pt's son, Loraine Leriche, no answer & unable to leave a voicemail Disposition Plan: depends on PT/OT recs   Level of care: Telemetry Cardiac  Status is: Inpatient Remains inpatient appropriate because: severity of illness    Consultants:    Procedures:   Antimicrobials:   Subjective: Pt c/o shortness of breath   Objective: Vitals:   09/10/21 0200 09/10/21 0400 09/10/21 0515 09/10/21 0700  BP: (!) 115/45 (!) 122/39 (!) 129/59 125/71  Pulse: (!) 59 (!) 59 64 64  Resp: 19 17 20  (!) 28  Temp:   97.8 F (36.6 C)   SpO2: 96% 93% 98% 92%  Weight:      Height:        Intake/Output Summary (Last 24 hours) at 09/10/2021 0831 Last data filed at 09/09/2021 2350 Gross per 24 hour  Intake 520 ml  Output 1000 ml  Net -480 ml   Filed Weights   09/09/21 1143  Weight: 72 kg    Examination:  General exam: Appears calm and comfortable  Respiratory system: diminished breath sounds b/l  Cardiovascular system: S1 & S2+. No rubs, gallops or clicks.  Gastrointestinal system:  Abdomen is nondistended, soft and nontender. Normal bowel sounds heard. Central nervous system: Alert and oriented. Moves all extremities  Psychiatry: Judgement and insight appear normal. Mood & affect appropriate.     Data Reviewed: I have personally reviewed following labs and imaging studies  CBC: Recent Labs  Lab 09/09/21 1207 09/10/21 0531  WBC 9.6 8.5  HGB 7.2* 7.3*  HCT 26.1* 26.5*  MCV 88.2 90.4  PLT 290 266   Basic Metabolic Panel: Recent Labs  Lab 09/09/21 1207 09/10/21 0531  NA 140 142  K 4.0 4.2  CL 104 105  CO2 29 29  GLUCOSE 102* 93  BUN 19 19  CREATININE 1.13* 1.14*  CALCIUM 8.0* 7.9*   GFR: Estimated Creatinine Clearance: 29.3 mL/min (A) (by C-G formula based on SCr of 1.14 mg/dL (H)). Liver Function Tests: No results for input(s): "AST", "ALT", "ALKPHOS", "BILITOT", "PROT", "ALBUMIN" in the last 168 hours. No results for input(s): "LIPASE", "AMYLASE" in the last 168 hours. No results for input(s): "AMMONIA" in the last 168 hours. Coagulation Profile: No results for input(s): "INR", "PROTIME" in the last 168 hours. Cardiac Enzymes: No results for input(s): "CKTOTAL", "CKMB", "CKMBINDEX", "TROPONINI" in the last 168 hours. BNP (last 3 results) No results for input(s): "PROBNP" in the last 8760 hours. HbA1C: No results for input(s): "HGBA1C" in the last 72 hours. CBG: Recent Labs  Lab 09/09/21 2311 09/10/21 0741  GLUCAP 147* 58*   Lipid Profile: No results for input(s): "CHOL", "HDL", "LDLCALC", "TRIG", "CHOLHDL", "LDLDIRECT" in the last 72 hours. Thyroid Function Tests: No results for input(s): "TSH", "T4TOTAL", "FREET4", "T3FREE", "THYROIDAB" in the last 72 hours. Anemia Panel: No results for input(s): "VITAMINB12", "FOLATE", "FERRITIN", "TIBC", "IRON", "RETICCTPCT" in the last 72 hours. Sepsis Labs: No results for input(s): "PROCALCITON", "LATICACIDVEN" in the last 168 hours.  No results found for this or any previous visit (from the  past 240 hour(s)).       Radiology Studies: DG Chest 1 View  Result Date: 09/09/2021 CLINICAL DATA:  Syncope.  Frequent falls.  Peripheral edema. EXAM: CHEST  1 VIEW COMPARISON:  09/05/2021 FINDINGS: The patient is rotated to the right on today's radiograph, reducing diagnostic sensitivity and specificity. Retrocardiac density favoring moderate-sized hiatal hernia. Bifid distal right fourth rib. Atherosclerotic calcification of the aortic arch. Suspected mild enlargement of the cardiopericardial silhouette, without overt edema. Vaguely accentuated density at the lung bases without obscuration of the diaphragms, probably from soft tissues of the chest wall, less likely from layering pleural effusions. IMPRESSION: 1. Mild enlargement of the cardiopericardial silhouette, without edema. 2.  Aortic Atherosclerosis (ICD10-I70.0). 3. Hazy density at the lung bases without obscuration of the diaphragms, probably due to soft tissues of the chest wall/body habitus, less likely from layering pleural effusions. 4. Bifid right anterior fourth rib. Electronically Signed   By: Gaylyn Rong M.D.   On: 09/09/2021 17:00   CT Cervical Spine Wo Contrast  Result Date: 09/09/2021 CLINICAL DATA:  Syncopal of ad. Fall with trauma to the head and neck. EXAM: CT CERVICAL SPINE WITHOUT CONTRAST TECHNIQUE: Multidetector CT imaging of the cervical spine was performed without intravenous contrast. Multiplanar CT image reconstructions were also generated. RADIATION DOSE REDUCTION: This exam was performed according to the departmental dose-optimization program which includes automated exposure control, adjustment of the mA and/or kV according to patient size and/or use of iterative reconstruction technique. COMPARISON:  None FINDINGS: Alignment: No malalignment. Skull base and vertebrae: No evidence of regional fracture or focal bone lesion. Soft tissues and spinal canal: No traumatic soft tissue finding. Disc levels: Chronic  osteoarthritis at the C1-2 articulation. Chronic degenerative spondylosis from C3-4 through C6-7. Chronic facet osteoarthritis on the left at C2-3 and C3-4. No compressive bony canal stenosis. Mild bony foraminal narrowing. Upper chest: Negative other than a left pleural effusion. Other: None IMPRESSION: No acute or traumatic finding. Ordinary degenerative spondylosis and facet arthritis. Left pleural effusion. Electronically Signed   By: Paulina Fusi M.D.   On: 09/09/2021 15:48   CT Head Wo Contrast  Result Date: 09/09/2021 CLINICAL DATA:  Syncopal event.  Frequent falling. EXAM: CT HEAD WITHOUT CONTRAST TECHNIQUE: Contiguous axial images were obtained from the base of the skull through the vertex without intravenous contrast. RADIATION DOSE REDUCTION: This exam was performed according to the departmental dose-optimization program which includes automated exposure control, adjustment of the mA and/or kV according to patient size and/or use of iterative reconstruction technique. COMPARISON:  08/11/2021 FINDINGS: Brain: Generalized age related atrophy. Moderate chronic small-vessel ischemic changes of the cerebral hemispheric white matter. No sign of acute infarction, mass lesion, hemorrhage, hydrocephalus or extra-axial collection. Vascular: There is atherosclerotic calcification of the major vessels at the base of the brain. Skull: No skull fracture. Sinuses/Orbits: Sinuses are clear.  Orbits are normal. Other: None IMPRESSION: No acute or traumatic finding. Age related atrophy. Chronic small-vessel ischemic changes of the cerebral hemispheric  white matter. Electronically Signed   By: Paulina Fusi M.D.   On: 09/09/2021 15:46   DG Knee Complete 4 Views Left  Result Date: 09/09/2021 CLINICAL DATA:  Trauma, fall, pain EXAM: LEFT KNEE - COMPLETE 4+ VIEW COMPARISON:  None Available. FINDINGS: No recent fracture or dislocation is seen. There is no significant effusion in the suprapatellar bursa. Degenerative  changes are noted with bony spurs in medial, lateral and patellofemoral compartments. Arterial calcifications are seen in the soft tissues. IMPRESSION: No recent fracture or dislocation is seen in the left knee. Degenerative changes are noted with bony spurs. There is no significant effusion. Electronically Signed   By: Ernie Avena M.D.   On: 09/09/2021 15:18   DG Wrist Complete Left  Result Date: 09/09/2021 CLINICAL DATA:  Trauma, fall, pain EXAM: LEFT WRIST - COMPLETE 3+ VIEW COMPARISON:  None Available. FINDINGS: No recent fracture or dislocation is seen. Severe degenerative changes are noted in first carpometacarpal joint. IMPRESSION: No recent fracture or dislocation is seen in the left wrist. Severe degenerative changes are noted in first carpometacarpal joint. Electronically Signed   By: Ernie Avena M.D.   On: 09/09/2021 15:17        Scheduled Meds:  aspirin EC  81 mg Oral Daily   atorvastatin  80 mg Oral QHS   cholecalciferol  5,000 Units Oral Daily   enoxaparin (LOVENOX) injection  30 mg Subcutaneous Q24H   ferrous sulfate  325 mg Oral Daily   furosemide  40 mg Intravenous Q12H   gabapentin  300 mg Oral Daily   insulin aspart  0-9 Units Subcutaneous TID AC & HS   insulin glargine-yfgn  44 Units Subcutaneous QHS   metoprolol tartrate  12.5 mg Oral BID   traZODone  50 mg Oral QHS   Continuous Infusions:   LOS: 1 day    Time spent: 35 mins     Charise Killian, MD [facility] Pager [phone]  If 7PM-7AM, please contact night-coverage 09/10/2021, 8:31 AM

## 2021-09-10 NOTE — Consult Note (Signed)
   Heart Failure Nurse Navigator Note  HFpEF 55 to 60%.  She presented to the emergency room with shortness of breath, worsening dyspnea on exertion, lower extremity edema, 3 pillow orthopnea and PND.  Comorbidities:  Diabetes Depression GERD COPD Hypertension Hyperlipidemia CVA Anemia Legally blind  Medications:  Aspirin 81 mg daily Atorvastatin 80 mg daily Lovenox 30 mg subcu every 24 Ferrous sulfate 325 mg daily Furosemide 40 mg IV every 12 hours  Labs:  BNP 1055, sodium 142, potassium 4.2, chloride 105, CO2 29, BUN 19, creatinine 1.14, hemoglobin 7.3, hematocrit 26.5 Weight is 72 kg Blood pressure 122/80  Initial meeting with patient who was lying on a bed in the emergency room in no acute distress.  She states that she is legally blind and is also hard of hearing and does not have her hearing aid in so she request that I call and speak with her son and her at the same time.  Was able to get her son Loraine Leriche on the phone and explained heart failure and what his mom needs to do to be taking care of her self such as daily weights and what to report, low-sodium diet, fluid restriction and taking her medications as ordered.  Also went over changes in symptoms to report.  She states that she does not have a scale, I told her that I would supply her with 1.  And asked Loraine Leriche if he would help her with her daily weights and recording and what to report.  Made aware of follow-up in the outpatient heart failure clinic for which she has an appointment July 20 at 2:30 in the afternoon.  She has a 16% no-show which is 12 out of 79 appointments.  I made Loraine Leriche aware that I was giving his mother a red folder that contained educational materials about heart failure.  They had no further questions.  Tresa Endo RN [facility]

## 2021-09-10 NOTE — Assessment & Plan Note (Signed)
-   We will place on supplement coverage with NovoLog and continue basal coverage. - We will continue Neurontin and hold off metformin.

## 2021-09-10 NOTE — Assessment & Plan Note (Signed)
-   We will continue her antihypertensives. 

## 2021-09-10 NOTE — TOC Initial Note (Signed)
Transition of Care [facility]) - Initial/Assessment Note    Patient Details  Name: Judy Harvey MRN: [id number] Date of Birth: [birth year]  [facility] of [facility]) CM/SW Contact:    Candie Chroman, LCSW Phone Number: 09/10/2021, 9:43 AM  Clinical Narrative:  Readmission prevention screen complete. CSW met with patient. No supports at bedside. CSW introduced role and explained that discharge planning would be discussed. PCP is Adrian Prows, MD. Son drives her to appointments. [facility] [place]. No issues obtaining medications. She has a personal care aide that comes in 4 days a week for 4 hours. She has a RW, BSC, transfer chair, and oxygen at home (2 L through [employer]). No further concerns. CSW encouraged patient to contact CSW as needed. CSW will continue to follow patient for support and facilitate return home when stable. Son will transport her home at discharge.                 Expected Discharge Plan: Home/Self Care (with PCS) Barriers to Discharge: Continued Medical Work up   Patient Goals and CMS Choice        Expected Discharge Plan and Services Expected Discharge Plan: Home/Self Care (with PCS)     Post Acute Care Choice: Resumption of Svcs/PTA Provider Living arrangements for the past 2 months: Single Family Home                                      Prior Living Arrangements/Services Living arrangements for the past 2 months: Single Family Home Lives with:: Adult Children Patient language and need for interpreter reviewed:: Yes Do you feel safe going back to the place where you live?: Yes      Need for Family Participation in Patient Care: Yes (Comment) Care giver support system in place?: Yes (comment) Current home services: DME, Homehealth aide Criminal Activity/Legal Involvement Pertinent to Current Situation/Hospitalization: No - Comment as needed  Activities of Daily Living      Permission Sought/Granted                  Emotional  Assessment Appearance:: Appears stated age Attitude/Demeanor/Rapport: Engaged, Gracious Affect (typically observed): Accepting, Appropriate, Calm, Pleasant Orientation: : Oriented to Self, Oriented to Place, Oriented to  Time, Oriented to Situation Alcohol / Substance Use: Not Applicable Psych Involvement: No (comment)  Admission diagnosis:  Acute CHF (congestive heart failure) (HCC) [I50.9] Patient Active Problem List   Diagnosis Date Noted   GERD without esophagitis 09/10/2021   Type 2 diabetes mellitus with peripheral neuropathy ([place]) 09/10/2021   Syncope, vasovagal 09/10/2021   Acute CHF (congestive heart failure) ([place]) 09/09/2021   Symptomatic anemia 08/11/2021   Dyslipidemia 08/11/2021   Essential hypertension 08/11/2021   Diabetic neuropathy ([place]) 08/11/2021   Hypotension 08/11/2021   Hypoglycemia 05/24/2021   Chronic kidney disease, stage 3a ([place]) 05/24/2021   HTN (hypertension) 05/24/2021   Type II diabetes mellitus with renal manifestations ([place]) 05/24/2021   Generalized weakness 05/19/2021   Elevated troponin 05/19/2021   Chronic respiratory failure with hypoxia ([place]) 05/19/2021   COPD (chronic obstructive pulmonary disease) ([place]) 05/19/2021   Acute CVA (cerebrovascular accident) ([place]) 05/15/2021   Left sided numbness 05/14/2021   ARF (acute renal failure) ([place]) 03/12/2021   Hypotension due to hypovolemia    Acute respiratory failure with hypoxia ([place])    Pneumonitis 10/25/2020   Major depressive 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 ([place]) 08/21/2018  Low vitamin B12 level 04/26/2018   Iron deficiency anemia 03/28/2018   Frequent PVCs 03/21/2018   PSVT (paroxysmal supraventricular tachycardia) ([place]) 03/21/2018   Normocytic anemia 02/28/2018   Benign essential HTN 02/20/2018   SOBOE (shortness of breath on exertion) 02/20/2018   Carotid stenosis, symptomatic w/o infarct, left 10/18/2017   TIA (transient ischemic attack) 06/16/2017   Bilateral carotid artery stenosis  06/16/2017   Hyperlipidemia 06/16/2017   Obesity (BMI 30.0-34.9) 05/02/2016   Vitamin D deficiency 03/25/2016   Type 2 diabetes mellitus without complication, with long-term current use of insulin ([place]) 03/24/2016   Recent cerebrovascular accident 05/14/21 (CVA) 03/24/2016   Legally blind 03/24/2016   Macular degeneration 03/24/2016   Constipation 03/24/2016   History of esophageal stricture 03/24/2016   Depression 03/24/2016   Restless leg syndrome 03/24/2016   Postherpetic neuralgia 03/24/2016   Gait abnormality 03/24/2016   PCP:  Leonel Ramsay, MD Pharmacy[facility]   [id number] [place] [id number]: [phone]: [phone]    Social Determinants of Health (SDOH) Interventions    Readmission Risk Interventions    09/10/2021    9:41 AM  Readmission Risk Prevention Plan  Transportation Screening Complete  PCP or Specialist Appt within 3-5 Days Complete  [place] or [place]  Social Work Consult for [place]Counseling Complete  Palliative Care Screening Not Applicable  Medication Review (Press photographer Complete

## 2021-09-11 ENCOUNTER — Other Ambulatory Visit: Payer: Self-pay

## 2021-09-11 ENCOUNTER — Encounter: Payer: Self-pay | Admitting: Family Medicine

## 2021-09-11 DIAGNOSIS — I10 Essential (primary) hypertension: Secondary | ICD-10-CM | POA: Diagnosis not present

## 2021-09-11 DIAGNOSIS — I5031 Acute diastolic (congestive) heart failure: Secondary | ICD-10-CM | POA: Diagnosis not present

## 2021-09-11 DIAGNOSIS — D508 Other iron deficiency anemias: Secondary | ICD-10-CM | POA: Diagnosis not present

## 2021-09-11 LAB — HEMOGLOBIN A1C
Hgb A1c MFr Bld: 6.6 % — ABNORMAL HIGH (ref 4.8–5.6)
Mean Plasma Glucose: 143 mg/dL

## 2021-09-11 LAB — GLUCOSE, CAPILLARY
Glucose-Capillary: 206 mg/dL — ABNORMAL HIGH (ref 70–99)
Glucose-Capillary: 79 mg/dL (ref 70–99)
Glucose-Capillary: 155 mg/dL — ABNORMAL HIGH (ref 70–99)
Glucose-Capillary: 154 mg/dL — ABNORMAL HIGH (ref 70–99)
Glucose-Capillary: 200 mg/dL — ABNORMAL HIGH (ref 70–99)

## 2021-09-11 LAB — CBC
HCT: 28.4 % — ABNORMAL LOW (ref 36.0–46.0)
Hemoglobin: 7.8 g/dL — ABNORMAL LOW (ref 12.0–15.0)
MCH: 24.7 pg — ABNORMAL LOW (ref 26.0–34.0)
MCHC: 27.5 g/dL — ABNORMAL LOW (ref 30.0–36.0)
MCV: 89.9 fL (ref 80.0–100.0)
Platelets: 295 10*3/uL (ref 150–400)
RBC: 3.16 MIL/uL — ABNORMAL LOW (ref 3.87–5.11)
RDW: 20.9 % — ABNORMAL HIGH (ref 11.5–15.5)
WBC: 11.3 10*3/uL — ABNORMAL HIGH (ref 4.0–10.5)
nRBC: 0.2 % (ref 0.0–0.2)

## 2021-09-11 LAB — BASIC METABOLIC PANEL
Anion gap: 6 (ref 5–15)
BUN: 22 mg/dL (ref 8–23)
CO2: 37 mmol/L — ABNORMAL HIGH (ref 22–32)
Calcium: 8.3 mg/dL — ABNORMAL LOW (ref 8.9–10.3)
Chloride: 99 mmol/L (ref 98–111)
Creatinine, Ser: 1.01 mg/dL — ABNORMAL HIGH (ref 0.44–1.00)
GFR, Estimated: 52 mL/min — ABNORMAL LOW (ref >60)
Glucose, Bld: 37 mg/dL — CL (ref 70–99)
Potassium: 3.6 mmol/L (ref 3.5–5.1)
Sodium: 142 mmol/L (ref 135–145)

## 2021-09-11 MED ORDER — DOCUSATE SODIUM 100 MG PO CAPS
200.0000 mg | ORAL_CAPSULE | Freq: Two times a day (BID) | ORAL | Status: DC
  Administered 2021-09-11 – 2021-09-14 (×7): 200 mg via ORAL
  Filled 2021-09-11 (×8): qty 2

## 2021-09-11 MED ORDER — FUROSEMIDE 10 MG/ML IJ SOLN
40.0000 mg | Freq: Every day | INTRAMUSCULAR | Status: DC
  Administered 2021-09-12 – 2021-09-14 (×3): 40 mg via INTRAVENOUS
  Filled 2021-09-11 (×3): qty 4

## 2021-09-11 MED ORDER — INSULIN GLARGINE-YFGN 100 UNIT/ML ~~LOC~~ SOLN: 20.0000 [IU] | Freq: Every day | SUBCUTANEOUS | Status: DC

## 2021-09-11 MED ORDER — DEXTROSE 50 % IV SOLN
INTRAVENOUS | Status: AC
  Administered 2021-09-11: 50 mL
  Filled 2021-09-11: qty 50

## 2021-09-11 MED ORDER — POLYETHYLENE GLYCOL 3350 17 G PO PACK
17.0000 g | PACK | Freq: Every day | ORAL | Status: DC
  Administered 2021-09-11 – 2021-09-14 (×4): 17 g via ORAL
  Filled 2021-09-11 (×4): qty 1

## 2021-09-11 MED ORDER — INSULIN GLARGINE-YFGN 100 UNIT/ML ~~LOC~~ SOLN
10.0000 [IU] | Freq: Every day | SUBCUTANEOUS | Status: DC
  Administered 2021-09-11 – 2021-09-13 (×3): 10 [IU] via SUBCUTANEOUS
  Filled 2021-09-11 (×4): qty 0.1

## 2021-09-11 NOTE — Progress Notes (Addendum)
PROGRESS NOTE    Judy Harvey  PYK:[id number] DOB: [birth year] DOA: 09/09/2021 PCP: Mick Sell, MD   Assessment & Plan:   Principal Problem:   Acute CHF (congestive heart failure) (HCC) Active Problems:   Syncope, vasovagal   Type 2 diabetes mellitus with peripheral neuropathy (HCC)   Benign essential HTN   Hyperlipidemia   GERD without esophagitis  Assessment and Plan: Acute on chronic diastolic CHF: as per echo 05/15/21. Continue on IV lasix. Monitor I/Os. Received CHF education via CHF educator    Vasovagal syncope: orthostatic vitals will be done today   Constipation: started on colace, miralax   IDA: H&H are labile. Continue on IV iron. Will transfuse if Hb < 7.0  DM2: well controlled, HbA1c 6.6. Continue on glargine, SSI w/ accuchecks. Pt refused carb modified & heart healthy diet. Pt was placed on regular diet as per her request   HTN: continue on BB   HLD: continue on statin    GERD: continue on PPI         DVT prophylaxis: lovenox  Code Status: full  Family Communication: called pt's son, Loraine Leriche, and answered his questions  Disposition Plan: depends on PT/OT recs   Level of care: Telemetry Cardiac  Status is: Inpatient Remains inpatient appropriate because: severity of illness    Consultants:    Procedures:   Antimicrobials:   Subjective: Pt c/o constipation.   Objective: Vitals:   09/11/21 0504 09/11/21 0649 09/11/21 0744 09/11/21 1135  BP: (!) 135/51  (!) 103/57 140/85  Pulse: 65  66 (!) 130  Resp: 20  20 18   Temp: 98.8 F (37.1 C)     TempSrc:      SpO2: 98%  100% (!) 89%  Weight:  73.5 kg    Height:        Intake/Output Summary (Last 24 hours) at 09/11/2021 1419 Last data filed at 09/11/2021 1100 Gross per 24 hour  Intake 237 ml  Output 7302 ml  Net -7065 ml   Filed Weights   09/10/21 1932 09/11/21 0500 09/11/21 0649  Weight: 75.8 kg 75.6 kg 73.5 kg    Examination:  General exam: Appears comfortable    Respiratory system: decreased breath sounds b/l  Cardiovascular system: S1/S2+. No rubs or clicks  Gastrointestinal system: Abd is soft, NT, ND & hypoactive bowel sounds  Central nervous system: Alert and oriented. Moves all extremities  Psychiatry: Judgement and insight appears at baseline. Appropriate mood and affect      Data Reviewed: I have personally reviewed following labs and imaging studies  CBC: Recent Labs  Lab 09/09/21 1207 09/10/21 0531 09/11/21 0628  WBC 9.6 8.5 11.3*  HGB 7.2* 7.3* 7.8*  HCT 26.1* 26.5* 28.4*  MCV 88.2 90.4 89.9  PLT 290 266 295   Basic Metabolic Panel: Recent Labs  Lab 09/09/21 1207 09/10/21 0531 09/11/21 0628  NA 140 142 142  K 4.0 4.2 3.6  CL 104 105 99  CO2 29 29 37*  GLUCOSE 102* 93 37*  BUN 19 19 22   CREATININE 1.13* 1.14* 1.01*  CALCIUM 8.0* 7.9* 8.3*   GFR: Estimated Creatinine Clearance: 33.4 mL/min (A) (by C-G formula based on SCr of 1.01 mg/dL (H)). Liver Function Tests: No results for input(s): "AST", "ALT", "ALKPHOS", "BILITOT", "PROT", "ALBUMIN" in the last 168 hours. No results for input(s): "LIPASE", "AMYLASE" in the last 168 hours. No results for input(s): "AMMONIA" in the last 168 hours. Coagulation Profile: No results for input(s): "INR", "PROTIME" in the  last 168 hours. Cardiac Enzymes: No results for input(s): "CKTOTAL", "CKMB", "CKMBINDEX", "TROPONINI" in the last 168 hours. BNP (last 3 results) No results for input(s): "PROBNP" in the last 8760 hours. HbA1C: Recent Labs    09/10/21 0531  HGBA1C 6.6*   CBG: Recent Labs  Lab 09/10/21 1716 09/10/21 2207 09/11/21 0738 09/11/21 0813 09/11/21 1137  GLUCAP 118* 98 79 206* 155*   Lipid Profile: No results for input(s): "CHOL", "HDL", "LDLCALC", "TRIG", "CHOLHDL", "LDLDIRECT" in the last 72 hours. Thyroid Function Tests: No results for input(s): "TSH", "T4TOTAL", "FREET4", "T3FREE", "THYROIDAB" in the last 72 hours. Anemia Panel: No results for  input(s): "VITAMINB12", "FOLATE", "FERRITIN", "TIBC", "IRON", "RETICCTPCT" in the last 72 hours. Sepsis Labs: No results for input(s): "PROCALCITON", "LATICACIDVEN" in the last 168 hours.  No results found for this or any previous visit (from the past 240 hour(s)).       Radiology Studies: DG Chest 1 View  Result Date: 09/09/2021 CLINICAL DATA:  Syncope.  Frequent falls.  Peripheral edema. EXAM: CHEST  1 VIEW COMPARISON:  09/05/2021 FINDINGS: The patient is rotated to the right on today's radiograph, reducing diagnostic sensitivity and specificity. Retrocardiac density favoring moderate-sized hiatal hernia. Bifid distal right fourth rib. Atherosclerotic calcification of the aortic arch. Suspected mild enlargement of the cardiopericardial silhouette, without overt edema. Vaguely accentuated density at the lung bases without obscuration of the diaphragms, probably from soft tissues of the chest wall, less likely from layering pleural effusions. IMPRESSION: 1. Mild enlargement of the cardiopericardial silhouette, without edema. 2.  Aortic Atherosclerosis (ICD10-I70.0). 3. Hazy density at the lung bases without obscuration of the diaphragms, probably due to soft tissues of the chest wall/body habitus, less likely from layering pleural effusions. 4. Bifid right anterior fourth rib. Electronically Signed   By: Gaylyn Rong M.D.   On: 09/09/2021 17:00   CT Cervical Spine Wo Contrast  Result Date: 09/09/2021 CLINICAL DATA:  Syncopal of ad. Fall with trauma to the head and neck. EXAM: CT CERVICAL SPINE WITHOUT CONTRAST TECHNIQUE: Multidetector CT imaging of the cervical spine was performed without intravenous contrast. Multiplanar CT image reconstructions were also generated. RADIATION DOSE REDUCTION: This exam was performed according to the departmental dose-optimization program which includes automated exposure control, adjustment of the mA and/or kV according to patient size and/or use of iterative  reconstruction technique. COMPARISON:  None FINDINGS: Alignment: No malalignment. Skull base and vertebrae: No evidence of regional fracture or focal bone lesion. Soft tissues and spinal canal: No traumatic soft tissue finding. Disc levels: Chronic osteoarthritis at the C1-2 articulation. Chronic degenerative spondylosis from C3-4 through C6-7. Chronic facet osteoarthritis on the left at C2-3 and C3-4. No compressive bony canal stenosis. Mild bony foraminal narrowing. Upper chest: Negative other than a left pleural effusion. Other: None IMPRESSION: No acute or traumatic finding. Ordinary degenerative spondylosis and facet arthritis. Left pleural effusion. Electronically Signed   By: Paulina Fusi M.D.   On: 09/09/2021 15:48   CT Head Wo Contrast  Result Date: 09/09/2021 CLINICAL DATA:  Syncopal event.  Frequent falling. EXAM: CT HEAD WITHOUT CONTRAST TECHNIQUE: Contiguous axial images were obtained from the base of the skull through the vertex without intravenous contrast. RADIATION DOSE REDUCTION: This exam was performed according to the departmental dose-optimization program which includes automated exposure control, adjustment of the mA and/or kV according to patient size and/or use of iterative reconstruction technique. COMPARISON:  08/11/2021 FINDINGS: Brain: Generalized age related atrophy. Moderate chronic small-vessel ischemic changes of the cerebral hemispheric white matter.  No sign of acute infarction, mass lesion, hemorrhage, hydrocephalus or extra-axial collection. Vascular: There is atherosclerotic calcification of the major vessels at the base of the brain. Skull: No skull fracture. Sinuses/Orbits: Sinuses are clear.  Orbits are normal. Other: None IMPRESSION: No acute or traumatic finding. Age related atrophy. Chronic small-vessel ischemic changes of the cerebral hemispheric white matter. Electronically Signed   By: Paulina Fusi M.D.   On: 09/09/2021 15:46   DG Knee Complete 4 Views  Left  Result Date: 09/09/2021 CLINICAL DATA:  Trauma, fall, pain EXAM: LEFT KNEE - COMPLETE 4+ VIEW COMPARISON:  None Available. FINDINGS: No recent fracture or dislocation is seen. There is no significant effusion in the suprapatellar bursa. Degenerative changes are noted with bony spurs in medial, lateral and patellofemoral compartments. Arterial calcifications are seen in the soft tissues. IMPRESSION: No recent fracture or dislocation is seen in the left knee. Degenerative changes are noted with bony spurs. There is no significant effusion. Electronically Signed   By: Ernie Avena M.D.   On: 09/09/2021 15:18   DG Wrist Complete Left  Result Date: 09/09/2021 CLINICAL DATA:  Trauma, fall, pain EXAM: LEFT WRIST - COMPLETE 3+ VIEW COMPARISON:  None Available. FINDINGS: No recent fracture or dislocation is seen. Severe degenerative changes are noted in first carpometacarpal joint. IMPRESSION: No recent fracture or dislocation is seen in the left wrist. Severe degenerative changes are noted in first carpometacarpal joint. Electronically Signed   By: Ernie Avena M.D.   On: 09/09/2021 15:17        Scheduled Meds:  aspirin EC  81 mg Oral Daily   atorvastatin  80 mg Oral QHS   cholecalciferol  5,000 Units Oral Daily   docusate sodium  200 mg Oral BID   enoxaparin (LOVENOX) injection  30 mg Subcutaneous Q24H   [START ON 09/12/2021] furosemide  40 mg Intravenous Daily   gabapentin  300 mg Oral Daily   insulin aspart  0-9 Units Subcutaneous TID AC & HS   insulin glargine-yfgn  10 Units Subcutaneous QHS   metoprolol tartrate  12.5 mg Oral BID   polyethylene glycol  17 g Oral Daily   traZODone  50 mg Oral QHS   Continuous Infusions:  iron sucrose       LOS: 2 days    Time spent: 30 mins     Charise Killian, MD [facility] Pager [phone]  If 7PM-7AM, please contact night-coverage 09/11/2021, 2:19 PM

## 2021-09-11 NOTE — Progress Notes (Signed)
Date and time results received: 09/11/21 0730 (use smartphrase ".now" to insert current time)  Test: glucose 37 Critical Value: blood glucose 37  Name of Provider Notified: Mayford Knife   Orders Received? Or Actions Taken?: Actions Taken: p.o. orange juice offered, patient unable to take enough, IV replacement given by Edmonia James RN

## 2021-09-11 NOTE — Evaluation (Signed)
Occupational Therapy Evaluation Patient Details Name: Judy Harvey MRN: [id number] DOB: 1927/08/15 Today's Date: 09/11/2021   History of Present Illness Pt is a 88 y/o F admitted on 09/09/21 after presenting with c/o syncope & worsening dyspnea, BLE edema. Pt is being treated for acute on chronic diastolic CHF & vasovagal syncope. PMH: DM, depression, GERD, COPD, HTN, dyslipidemia, CVA, Covid-19, legally blind, B macular degeneration (Simultaneous filing. User may not have seen previous data.)   Clinical Impression   Chart reviewed, RN cleared pt for participation in OT evaluation. Pt is oriented to self only, verbose throughout evaluation. Co tx completed with PT on this date. Pt presents with deficits in strength, endurance, activity tolerance and endorses  fall history. Further mobility limited by vital signs on this date with BP checked in RUE: Supine: 119/91 mmHg (MAP 101), Sitting: 87/64 mmHg (MAP 71) with pt reporting mild dizziness symptoms. Pt reports she lives with son and has an aid that provides supervision when he is not home and assists with ADLs. At this time recommend discharge to STR to address functional deficits. Pt is left as recieved, NAD, all needs met. OT will follow acutely.       Recommendations for follow up therapy are one component of a multi-disciplinary discharge planning process, led by the attending physician.  Recommendations may be updated based on patient status, additional functional criteria and insurance authorization.   Follow Up Recommendations  Skilled nursing-short term rehab (<3 hours/day)    Assistance Recommended at Discharge Frequent or constant Supervision/Assistance  Patient can return home with the following A lot of help with bathing/dressing/bathroom;A lot of help with walking and/or transfers    Functional Status Assessment  Patient has had a recent decline in their functional status and demonstrates the ability to make significant  improvements in function in a reasonable and predictable amount of time.  Equipment Recommendations  Other (comment) (per next venue of care)    Recommendations for Other Services       Precautions / Restrictions Precautions Precautions: Fall Precaution Comments: watch orthostatics Restrictions Weight Bearing Restrictions: No      Mobility Bed Mobility Overal bed mobility: Needs Assistance Bed Mobility: Supine to Sit, Sit to Supine     Supine to sit: Min assist, +2 for physical assistance, HOB elevated Sit to supine: Min assist        Transfers                          Balance Overall balance assessment: Needs assistance Sitting-balance support: Feet supported, Bilateral upper extremity supported Sitting balance-Leahy Scale: Fair                                     ADL either performed or assessed with clinical judgement   ADL Overall ADL's : Needs assistance/impaired Eating/Feeding: Set up;Sitting                   Lower Body Dressing: Maximal assistance Lower Body Dressing Details (indicate cue type and reason): socks   Toilet Transfer Details (indicate cue type and reason): not attempted due to BP Toileting- Clothing Manipulation and Hygiene: Maximal assistance               Vision Baseline Vision/History: 6 Macular Degeneration Additional Comments: pt is legally bind from macular degeneration per pt no change from baseline  Perception     Praxis      Pertinent Vitals/Pain Pain Assessment Pain Assessment: No/denies pain     Hand Dominance Right   Extremity/Trunk Assessment Upper Extremity Assessment Upper Extremity Assessment: Generalized weakness   Lower Extremity Assessment Lower Extremity Assessment: Generalized weakness   Cervical / Trunk Assessment Cervical / Trunk Assessment: Kyphotic   Communication Communication Communication: HOH (legally blind)   Cognition Arousal/Alertness:  Awake/alert Behavior During Therapy: WFL for tasks assessed/performed Overall Cognitive Status: No family/caregiver present to determine baseline cognitive functioning Area of Impairment: Orientation, Attention, Memory, Following commands, Safety/judgement, Awareness, Problem solving                 Orientation Level: Disoriented to, Time, Situation Current Attention Level: Focused Memory: Decreased recall of precautions, Decreased short-term memory Following Commands: Follows one step commands with increased time Safety/Judgement: Decreased awareness of safety, Decreased awareness of deficits Awareness: Intellectual Problem Solving: Slow processing, Decreased initiation, Difficulty sequencing, Requires verbal cues, Requires tactile cues       General Comments  Pt BP 119/91 in supine, 87/64 at edge of bed with pt reported mild "swimmy headed" symptoms therefore further mobility deferred    Exercises     Shoulder Instructions      Home Living Family/patient expects to be discharged to:: Private residence Living Arrangements: Children Available Help at Discharge: Family;Personal care attendant;Available PRN/intermittently Type of Home: House Home Access: Stairs to enter [employer] of Steps: 2 Entrance Stairs-Rails: None Home Layout: Two level;Able to live on main level with bedroom/bathroom Alternate Level Stairs-Number of Steps: 2   Bathroom Shower/Tub: Occupational psychologist: Standard     Home Equipment: Conservation officer, nature (2 wheels)   Additional Comments: Information taken from prior chart entry      Prior Functioning/Environment               Mobility Comments: Pt reports she's ambulatory with walker, sometimes requires assistance for bed mobility, transfers, & gait. Endorses falls in recent past. ADLs Comments: pt reports she has an aid who stays with her while her son is out of the house        OT Problem List: Decreased  strength;Decreased activity tolerance;Decreased cognition;Impaired balance (sitting and/or standing)      OT Treatment/Interventions: Self-care/ADL training;Patient/family education;Therapeutic exercise;DME and/or AE instruction;Therapeutic activities;Balance training    OT Goals(Current goals can be found in the care plan section) Acute Rehab OT Goals Patient Stated Goal: drink her diet soda OT Goal Formulation: With patient Time For Goal Achievement: 09/25/21 Potential to Achieve Goals: Good ADL Goals Pt Will Perform Grooming: with min assist;sitting Pt Will Perform Upper Body Dressing: with min assist;sitting Pt Will Transfer to Toilet: with min assist;stand pivot transfer Pt Will Perform Toileting - Clothing Manipulation and hygiene: with min assist  OT Frequency: Min 2X/week    Co-evaluation   Reason for Co-Treatment: Complexity of the patient's impairments (multi-system involvement);Necessary to address cognition/behavior during functional activity;For patient/therapist safety PT goals addressed during session: Mobility/safety with mobility;Balance        AM-PAC OT "6 Clicks" Daily Activity     Outcome Measure Help from another person eating meals?: A Little Help from another person taking care of personal grooming?: A Little Help from another person toileting, which includes using toliet, bedpan, or urinal?: A Lot Help from another person bathing (including washing, rinsing, drying)?: A Lot Help from another person to put on and taking off regular upper body clothing?: A Lot Help from another  person to put on and taking off regular lower body clothing?: A Lot 6 Click Score: 14   End of Session Equipment Utilized During Treatment: Oxygen Nurse Communication: Mobility status  Activity Tolerance: Patient tolerated treatment well Patient left: in bed;with call bell/phone within reach;with bed alarm set  OT Visit Diagnosis: Muscle weakness (generalized) (M62.81);Repeated  falls (R29.6)                Time: 2458-0998 OT Time Calculation (min): 13 min Charges:  OT General Charges $OT Visit: 1 Visit OT Evaluation $OT Eval Moderate Complexity: 1 Mod Shanon Payor, OTD OTR/L  09/11/21, 2:54 PM

## 2021-09-11 NOTE — Progress Notes (Signed)
Patient hypoglycemic as per morning labs. BG of 37. Capillary recheck showed 79 BG. But patient groggy and unable to follow commands. Patient BG treated with IV replacement. BG rechecked 206 now. Morning insulin held. Patient more verbal and asked for warm blanket. Provider notified.

## 2021-09-11 NOTE — Evaluation (Signed)
Physical Therapy Evaluation Patient Details Name: Judy Harvey MRN: [id number] DOB: [birth year] Today's Date: 09/11/2021  History of Present Illness  Pt is a 88 y/o F admitted on 09/09/21 after presenting with c/o syncope & worsening dyspnea, BLE edema. Pt is being treated for acute on chronic diastolic CHF & vasovagal syncope. PMH: DM, depression, GERD, COPD, HTN, dyslipidemia, CVA, Covid-19, legally blind, B macular degeneration (Simultaneous filing. User may not have seen previous data.)  Clinical Impression  Pt seen for PT evaluation with co-tx with OT. Pt received in bed & agreeable to tx, reporting she intermittently needs assistance with mobility at home, & her son & aides provided assistance. Pt endorses falls in recent past. On this date pt completes supine>sit with min assist +2, sit>supine with min assist +1. Pt is limited by orthostatic hypotension & feeling slightly dizzy while sitting EOB. Pt is very verbose & internally distracted throughout session. Recommend STR upon d/c to maximize independence with functional mobility & reduce fall risk prior to return home.   BP checked in RUE: Supine: 119/91 mmHg (MAP 101) Sitting: 87/64 mmHg (MAP 71  Pt on 3L/min via nasal cannula throughout session (reports she's on 2L/min at baseline at home)   Recommendations for follow up therapy are one component of a multi-disciplinary discharge planning process, led by the attending physician.  Recommendations may be updated based on patient status, additional functional criteria and insurance authorization.  Follow Up Recommendations Skilled nursing-short term rehab (<3 hours/day) Can patient physically be transported by private vehicle: No    Assistance Recommended at Discharge Frequent or constant Supervision/Assistance  Patient can return home with the following  A lot of help with walking and/or transfers;A lot of help with bathing/dressing/bathroom;Assist for transportation;Assistance with  feeding;Assistance with cooking/housework;Direct supervision/assist for medications management;Help with stairs or ramp for entrance    Equipment Recommendations None recommended by PT  Recommendations for Other Services       Functional Status Assessment Patient has had a recent decline in their functional status and demonstrates the ability to make significant improvements in function in a reasonable and predictable amount of time.     Precautions / Restrictions Precautions Precautions: Fall Precaution Comments: watch orthostatics Restrictions Weight Bearing Restrictions: No      Mobility  Bed Mobility Overal bed mobility: Needs Assistance Bed Mobility: Supine to Sit, Sit to Supine     Supine to sit: Min assist, +2 for physical assistance, HOB elevated Sit to supine: Min assist        Transfers                        Ambulation/Gait                  Stairs            Wheelchair Mobility    Modified Rankin (Stroke Patients Only)       Balance Overall balance assessment: Needs assistance Sitting-balance support: Feet supported, Bilateral upper extremity supported Sitting balance-Leahy Scale: Fair                                       Pertinent Vitals/Pain Pain Assessment Pain Assessment: No/denies pain    Home Living Family/patient expects to be discharged to:: Private residence Living Arrangements: Children Available Help at Discharge: Family;Personal care attendant;Available PRN/intermittently Type of Home: House Home Access: Stairs to enter Entrance  Stairs-Rails: None Entrance Stairs-Number of Steps: 2 Alternate Level Stairs-Number of Steps: 2 Home Layout: Two level;Able to live on main level with bedroom/bathroom Home Equipment: Rolling Walker (2 wheels) Additional Comments: Information taken from prior chart entry    Prior Function               Mobility Comments: Pt reports she's ambulatory with  walker, sometimes requires assistance for bed mobility, transfers, & gait. Endorses falls in recent past. ADLs Comments: pt reports she has an aid who stays with her while her son is out of the house     Hand Dominance   Dominant Hand: Right    Extremity/Trunk Assessment   Upper Extremity Assessment Upper Extremity Assessment: Generalized weakness    Lower Extremity Assessment Lower Extremity Assessment: Generalized weakness    Cervical / Trunk Assessment Cervical / Trunk Assessment: Kyphotic  Communication   Communication: HOH (legally blind)  Cognition Arousal/Alertness: Awake/alert Behavior During Therapy: WFL for tasks assessed/performed Overall Cognitive Status: History of cognitive impairments - at baseline                                 General Comments: Not oriented to year, follows simple commands with extra time & cuing, internally distracted & verbose throughout session        General Comments      Exercises     Assessment/Plan    PT Assessment Patient needs continued PT services  PT Problem List Decreased strength;Decreased coordination;Cardiopulmonary status limiting activity;Decreased range of motion;Impaired sensation;Decreased cognition;Decreased activity tolerance;Decreased knowledge of use of DME;Decreased safety awareness;Decreased balance;Decreased knowledge of precautions;Decreased mobility       PT Treatment Interventions Therapeutic exercise;Gait training;Balance training;DME instruction;Stair training;Neuromuscular re-education;Modalities;Functional mobility training;Therapeutic activities;Patient/family education    PT Goals (Current goals can be found in the Care Plan section)  Acute Rehab PT Goals Patient Stated Goal: get better PT Goal Formulation: With patient Time For Goal Achievement: 09/25/21 Potential to Achieve Goals: Good    Frequency Min 2X/week     Co-evaluation PT/OT/SLP Co-Evaluation/Treatment: Yes Reason  for Co-Treatment: Complexity of the patient's impairments (multi-system involvement);Necessary to address cognition/behavior during functional activity;For patient/therapist safety PT goals addressed during session: Mobility/safety with mobility;Balance         AM-PAC PT "6 Clicks" Mobility  Outcome Measure Help needed turning from your back to your side while in a flat bed without using bedrails?: A Little Help needed moving from lying on your back to sitting on the side of a flat bed without using bedrails?: A Little Help needed moving to and from a bed to a chair (including a wheelchair)?: A Lot Help needed standing up from a chair using your arms (e.g., wheelchair or bedside chair)?: A Lot Help needed to walk in hospital room?: A Lot Help needed climbing 3-5 steps with a railing? : A Lot 6 Click Score: 14    End of Session   Activity Tolerance: Patient tolerated treatment well Patient left: in bed;with call bell/phone within reach;with bed alarm set Nurse Communication: Mobility status (BP) PT Visit Diagnosis: Unsteadiness on feet (R26.81);Muscle weakness (generalized) (M62.81);History of falling (Z91.81)    Time: 4540-9811 PT Time Calculation (min) (ACUTE ONLY): 13 min   Charges:   PT Evaluation $PT Eval Moderate Complexity: 1 Mod          Aleda Grana, PT, DPT 09/11/21, 2:51 PM   Sandi Mariscal 09/11/2021, 2:48 PM

## 2021-09-11 NOTE — Progress Notes (Signed)
PT Cancellation Note  Patient Details Name: STARLET GALLENTINE MRN: [id number] DOB: 04/21/1927   Cancelled Treatment:    Reason Eval/Treat Not Completed: Medical issues which prohibited therapy. Order received and chart reviewed. Pt most recent glucose reading is 37. Per RN, pt is being treated for hypoglycemia and asks therapy to attempt this afternoon. Will hold evaluation at this time and attempt a later date/time as pt is appropriate.    Basilia Jumbo PT, DPT

## 2021-09-11 NOTE — Plan of Care (Signed)

## 2021-09-12 DIAGNOSIS — I10 Essential (primary) hypertension: Secondary | ICD-10-CM | POA: Diagnosis not present

## 2021-09-12 DIAGNOSIS — I5031 Acute diastolic (congestive) heart failure: Secondary | ICD-10-CM | POA: Diagnosis not present

## 2021-09-12 DIAGNOSIS — D508 Other iron deficiency anemias: Secondary | ICD-10-CM | POA: Diagnosis not present

## 2021-09-12 LAB — GLUCOSE, CAPILLARY
Glucose-Capillary: 71 mg/dL (ref 70–99)
Glucose-Capillary: 132 mg/dL — ABNORMAL HIGH (ref 70–99)
Glucose-Capillary: 170 mg/dL — ABNORMAL HIGH (ref 70–99)
Glucose-Capillary: 139 mg/dL — ABNORMAL HIGH (ref 70–99)

## 2021-09-12 LAB — CBC
HCT: 25.3 % — ABNORMAL LOW (ref 36.0–46.0)
Hemoglobin: 7 g/dL — ABNORMAL LOW (ref 12.0–15.0)
MCH: 24.9 pg — ABNORMAL LOW (ref 26.0–34.0)
MCHC: 27.7 g/dL — ABNORMAL LOW (ref 30.0–36.0)
MCV: 90 fL (ref 80.0–100.0)
Platelets: 221 10*3/uL (ref 150–400)
RBC: 2.81 MIL/uL — ABNORMAL LOW (ref 3.87–5.11)
RDW: 21.2 % — ABNORMAL HIGH (ref 11.5–15.5)
WBC: 8.5 10*3/uL (ref 4.0–10.5)
nRBC: 0 % (ref 0.0–0.2)

## 2021-09-12 LAB — PREPARE RBC (CROSSMATCH)

## 2021-09-12 LAB — BASIC METABOLIC PANEL
Anion gap: 5 (ref 5–15)
BUN: 22 mg/dL (ref 8–23)
CO2: 38 mmol/L — ABNORMAL HIGH (ref 22–32)
Calcium: 7.8 mg/dL — ABNORMAL LOW (ref 8.9–10.3)
Chloride: 99 mmol/L (ref 98–111)
Creatinine, Ser: 0.96 mg/dL (ref 0.44–1.00)
GFR, Estimated: 56 mL/min — ABNORMAL LOW (ref >60)
Glucose, Bld: 89 mg/dL (ref 70–99)
Potassium: 3.6 mmol/L (ref 3.5–5.1)
Sodium: 142 mmol/L (ref 135–145)

## 2021-09-12 LAB — HEMOGLOBIN AND HEMATOCRIT, BLOOD
HCT: 31.3 % — ABNORMAL LOW (ref 36.0–46.0)
Hemoglobin: 8.7 g/dL — ABNORMAL LOW (ref 12.0–15.0)

## 2021-09-12 MED ORDER — IPRATROPIUM-ALBUTEROL 0.5-2.5 (3) MG/3ML IN SOLN
3.0000 mL | RESPIRATORY_TRACT | Status: AC | PRN
  Administered 2021-09-13 (×2): 3 mL via RESPIRATORY_TRACT
  Filled 2021-09-12 (×2): qty 3

## 2021-09-12 MED ORDER — SODIUM CHLORIDE 0.9% IV SOLUTION: Freq: Once | INTRAVENOUS | Status: AC

## 2021-09-12 MED ORDER — IPRATROPIUM-ALBUTEROL 0.5-2.5 (3) MG/3ML IN SOLN
RESPIRATORY_TRACT | Status: AC
  Administered 2021-09-12: 3 mL via RESPIRATORY_TRACT
  Filled 2021-09-12: qty 3

## 2021-09-12 MED ORDER — ENOXAPARIN SODIUM 40 MG/0.4ML IJ SOSY
40.0000 mg | PREFILLED_SYRINGE | INTRAMUSCULAR | Status: DC
  Administered 2021-09-12 – 2021-09-13 (×2): 40 mg via SUBCUTANEOUS
  Filled 2021-09-12 (×2): qty 0.4

## 2021-09-12 NOTE — Progress Notes (Signed)
Called to access patient due to sob.  Patient just finished receiving blood. BBS reveal fine crackles. Patient's main compliant is not being able to take a deep breath. O2 saturation noted at 94 on nasal cannula.  Patient speaks of scale that she received and not being able to see how to use scale because she is blind.  Verified RN with heart fail services has reached out to family about helping with scale. Patient asked for svn.  Will contact provider for prn order

## 2021-09-12 NOTE — Progress Notes (Signed)
Blood transfusion completed Patient tolerated the procedure well. No adverse reaction noted.

## 2021-09-12 NOTE — Progress Notes (Incomplete)
       CROSS COVER NOTE  NAME: Judy Harvey MRN: [id number] DOB : Feb 16, 1928    Date of Service   09/12/2021  HPI/Events of Note   Secure chat received from respiratory therapy "Patient is c/o some sob. she is not in distress. sats are fine on 3 liters. she just finished up with blood so she has some fine crackles. she does have copd hx. would like to know if can order prn duoneb"    Interventions   Plan: PRN Duoneb        This document was prepared using Dragon voice recognition software and may include unintentional dictation errors.  Bishop Limbo DNP, MHA, FNP-BC Nurse Practitioner [facility] Pager [phone]

## 2021-09-12 NOTE — TOC Progression Note (Signed)
Transition of Care [facility]) - Progression Note    Patient Details  Name: Judy Harvey MRN: [id number] Date of Birth: Jan 06, 1928  Transition of Care [facility]) CM/SW Contact  Gildardo Griffes, [place] Phone Number: 09/12/2021, 10:11 AM  Clinical Narrative:     CSW spoke with patient's son Loraine Leriche to update on PT/OT eval recs of SNF, he reports they are in agreement with this plan with preference for [facility].   CSW has sent referral to [facility] pending bed offers.   Expected Discharge Plan: Home/Self Care (with PCS) Barriers to Discharge: Continued Medical Work up  Expected Discharge Plan and Services Expected Discharge Plan: Home/Self Care (with PCS)     Post Acute Care Choice: Resumption of Svcs/PTA Provider Living arrangements for the past 2 months: Single Family Home                                       Social Determinants of Health (SDOH) Interventions    Readmission Risk Interventions    09/10/2021    9:41 AM  Readmission Risk Prevention Plan  Transportation Screening Complete  PCP or Specialist Appt within 3-5 Days Complete  HRI or Home Care Consult Complete  Social Work Consult for Recovery Care Planning/Counseling Complete  Palliative Care Screening Not Applicable  Medication Review Oceanographer) Complete

## 2021-09-12 NOTE — NC FL2 (Signed)
[place] LEVEL OF CARE SCREENING TOOL     IDENTIFICATION  Patient Name: Judy Harvey Birthdate: 02-01-1928 Sex: female Admission Date (Current Location): 09/09/2021  [facility] and [place] Number:  Engineering geologist and Address:  [facility], [address] [place], [place] [address]      Provider Number: [id number]  Attending Physician Name and Address:  Wyvonnia Dusky, MD  Relative Name and Phone Number:  [name] (son )[phone]    Current Level of Care: Hospital Recommended Level of Care: [place] Prior Approval Number:    Date Approved/Denied:   PASRR Number: [id number]  Discharge Plan: SNF    Current Diagnoses: Patient Active Problem List   Diagnosis Date Noted   GERD without esophagitis 09/10/2021   Type 2 diabetes mellitus with peripheral neuropathy ([place]) 09/10/2021   Syncope, vasovagal 09/10/2021   Acute CHF (congestive heart failure) ([place]) 09/09/2021   Symptomatic anemia 08/11/2021   Dyslipidemia 08/11/2021   Essential hypertension 08/11/2021   Diabetic neuropathy ([place]) 08/11/2021   Hypotension 08/11/2021   Hypoglycemia 05/24/2021   Chronic kidney disease, stage 3a ([place]) 05/24/2021   HTN (hypertension) 05/24/2021   Type II diabetes mellitus with renal manifestations ([place]) 05/24/2021   Generalized weakness 05/19/2021   Elevated troponin 05/19/2021   Chronic respiratory failure with hypoxia ([place]) 05/19/2021   COPD (chronic obstructive pulmonary disease) ([place]) 05/19/2021   Acute CVA (cerebrovascular accident) ([place]) 05/15/2021   Left sided numbness 05/14/2021   ARF (acute renal failure) ([place]) 03/12/2021   Hypotension due to hypovolemia    Acute respiratory failure with hypoxia (HCC)    Pneumonitis 10/25/2020   Major depressive 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 ([place]) 08/21/2018   Low vitamin B12 level 04/26/2018   Iron deficiency anemia 03/28/2018   Frequent PVCs 03/21/2018   PSVT  (paroxysmal supraventricular tachycardia) ([place]) 03/21/2018   Normocytic anemia 02/28/2018   Benign essential HTN 02/20/2018   SOBOE (shortness of breath on exertion) 02/20/2018   Carotid stenosis, symptomatic w/o infarct, left 10/18/2017   TIA (transient ischemic attack) 06/16/2017   Bilateral carotid artery stenosis 06/16/2017   Hyperlipidemia 06/16/2017   Obesity (BMI 30.0-34.9) 05/02/2016   Vitamin D deficiency 03/25/2016   Type 2 diabetes mellitus without complication, with long-term current use of insulin ([place]) 03/24/2016   Recent cerebrovascular accident 05/14/21 (CVA) 03/24/2016   Legally blind 03/24/2016   Macular degeneration 03/24/2016   Constipation 03/24/2016   History of esophageal stricture 03/24/2016   Depression 03/24/2016   Restless leg syndrome 03/24/2016   Postherpetic neuralgia 03/24/2016   Gait abnormality 03/24/2016    Orientation RESPIRATION BLADDER Height & Weight     Self, Time, Situation, Place  O2 (3L nasal cannula) Incontinent, External catheter Weight: 161 lb 2.5 oz (73.1 kg) Height:  5\' 2"  (157.5 cm)  BEHAVIORAL SYMPTOMS/MOOD NEUROLOGICAL BOWEL NUTRITION STATUS      Incontinent Diet (see discharge summary)  AMBULATORY STATUS COMMUNICATION OF NEEDS Skin   Limited Assist Verbally Other (Comment) (abrasion left knee)                       Personal Care Assistance Level of Assistance  Bathing, Feeding, Dressing, Total care Bathing Assistance: Limited assistance Feeding assistance: Limited assistance Dressing Assistance: Limited assistance Total Care Assistance: Limited assistance   Functional Limitations Info  Sight, Hearing, Speech Sight Info: Impaired (blind) Hearing Info: Impaired Speech Info: Adequate    SPECIAL CARE FACTORS FREQUENCY  PT (By licensed PT), OT (By licensed  OT)     PT Frequency: min 4x weekly OT Frequency: min 4x weekly            Contractures Contractures Info: Not present    Additional Factors Info  Code  Status, Allergies Code Status Info: DNR Allergies Info: adhesive (tape) , lyrica (pregabalin)           Current Medications (09/12/2021):  This is the current hospital active medication list Current Facility-Administered Medications  Medication Dose Route Frequency Provider Last Rate Last Admin   acetaminophen (TYLENOL) tablet 650 mg  650 mg Oral Q6H PRN Mansy, Jan A, MD   650 mg at 09/12/21 5053   Or   acetaminophen (TYLENOL) suppository 650 mg  650 mg Rectal Q6H PRN Mansy, Jan A, MD       ALPRAZolam Prudy Feeler) tablet 0.25 mg  0.25 mg Oral BID PRN Charise Killian, MD   0.25 mg at 09/12/21 9767   aspirin EC tablet 81 mg  81 mg Oral Daily Mansy, Jan A, MD   81 mg at 09/12/21 3419   atorvastatin (LIPITOR) tablet 80 mg  80 mg Oral QHS Mansy, Jan A, MD   80 mg at 09/11/21 2238   cholecalciferol (VITAMIN D) tablet 5,000 Units  5,000 Units Oral Daily Mansy, Jan A, MD   5,000 Units at 09/12/21 3790   docusate sodium (COLACE) capsule 200 mg  200 mg Oral BID Charise Killian, MD   200 mg at 09/12/21 0937   enoxaparin (LOVENOX) injection 30 mg  30 mg Subcutaneous Q24H Lowella Bandy, RPH   30 mg at 09/11/21 2241   furosemide (LASIX) injection 40 mg  40 mg Intravenous Daily Charise Killian, MD   40 mg at 09/12/21 2409   gabapentin (NEURONTIN) capsule 300 mg  300 mg Oral Daily Mansy, Jan A, MD   300 mg at 09/12/21 7353   insulin aspart (novoLOG) injection 0-9 Units  0-9 Units Subcutaneous TID [facility] Mansy, Jan A, MD   2 Units at 09/11/21 2239   insulin glargine-yfgn (SEMGLEE) injection 10 Units  10 Units Subcutaneous QHS Charise Killian, MD   10 Units at 09/11/21 2239   iron sucrose (VENOFER) 150 mg in sodium chloride 0.9 % 150 mL IVPB  150 mg Intravenous Q24H Charise Killian, MD 157.5 mL/hr at 09/11/21 1820 150 mg at 09/11/21 1820   magnesium hydroxide (MILK OF MAGNESIA) suspension 30 mL  30 mL Oral Daily PRN Mansy, Jan A, MD       metoprolol tartrate (LOPRESSOR) tablet 12.5 mg  12.5 mg  Oral BID Mansy, Jan A, MD   12.5 mg at 09/12/21 0938   ondansetron (ZOFRAN) tablet 4 mg  4 mg Oral Q6H PRN Mansy, Jan A, MD       Or   ondansetron [facility]) injection 4 mg  4 mg Intravenous Q6H PRN Mansy, Jan A, MD       ondansetron [facility]) tablet 8 mg  8 mg Oral Q8H PRN Mansy, Jan A, MD       polyethylene glycol (MIRALAX / GLYCOLAX) packet 17 g  17 g Oral Daily Charise Killian, MD   17 g at 09/12/21 2992   traZODone (DESYREL) tablet 25 mg  25 mg Oral QHS PRN Mansy, Jan A, MD       traZODone (DESYREL) tablet 50 mg  50 mg Oral QHS Mansy, Jan A, MD   50 mg at 09/11/21 2239     Discharge Medications: Please  see discharge summary for a list of discharge medications.  Relevant Imaging Results:  Relevant Lab Results:   Additional Information SSN:[id number]  Gildardo Griffes, LCSW

## 2021-09-12 NOTE — Progress Notes (Signed)
PROGRESS NOTE    Judy Harvey  KCL:[id number] DOB: 21-Apr-1927 DOA: 09/09/2021 PCP: Mick Sell, MD   Assessment & Plan:   Principal Problem:   Acute CHF (congestive heart failure) (HCC) Active Problems:   Syncope, vasovagal   Type 2 diabetes mellitus with peripheral neuropathy (HCC)   Benign essential HTN   Hyperlipidemia   GERD without esophagitis  Assessment and Plan: Acute on chronic diastolic CHF: as per echo 05/15/21. Continue on IV lasix but reduced to lasix once a day instead of BID. Monitor I/Os. Neg approx 1.6 L. Received CHF education via CHF educator. Much improved    Vasovagal syncope: orthostatics positive 7/1. Will hold lasix tomorrow   Constipation: continue on colace, miralax   IDA: Hb 7.0, will give 1 unit of pRBCs today. Repeat H&H 4 hours post transfusion. Continue on IV iron  DM2: well controlled, HbA1c 6.6. Continue on glargine, SSI w/ accuchecks. Pt refused a carb modified & heart healthy diet. Continue on regular diet as per pt's request   HTN: continue on metoprolol   HLD: continue on statin    GERD: continue on PPI        DVT prophylaxis: lovenox  Code Status: full  Family Communication:   Disposition Plan: depends on PT/OT recs   Level of care: Telemetry Cardiac  Status is: Inpatient Remains inpatient appropriate because: will get pRBC transfusion today & needs SNF placement     Consultants:    Procedures:   Antimicrobials:   Subjective: Pt c/o fatigue   Objective: Vitals:   09/11/21 2006 09/12/21 0014 09/12/21 0409 09/12/21 0445  BP: 138/84 (!) 122/47 (!) 120/48   Pulse: 74 70 74   Resp: 18 18 16    Temp: 98 F (36.7 C) 97.6 F (36.4 C) 98 F (36.7 C)   TempSrc: Oral Oral Oral   SpO2: 94% 100% 93%   Weight:    73.1 kg  Height:        Intake/Output Summary (Last 24 hours) at 09/12/2021 0744 Last data filed at 09/12/2021 0500 Gross per 24 hour  Intake 871.5 ml  Output 2500 ml  Net -1628.5 ml   Filed  Weights   09/11/21 0500 09/11/21 0649 09/12/21 0445  Weight: 75.6 kg 73.5 kg 73.1 kg    Examination:  General exam: Appears calm & comfortable  Respiratory system: diminished breath sounds b/l Cardiovascular system: S1 & S2+. No rubs or gallops  Gastrointestinal system: Abd is soft, NT,ND & hypoactive bowel sounds Central nervous system: Alert and oriented. Moves all extremities   Psychiatry: Judgement and insight appear at baseline. Flat mood and affect    Data Reviewed: I have personally reviewed following labs and imaging studies  CBC: Recent Labs  Lab 09/09/21 1207 09/10/21 0531 09/11/21 0628 09/12/21 0609  WBC 9.6 8.5 11.3* 8.5  HGB 7.2* 7.3* 7.8* 7.0*  HCT 26.1* 26.5* 28.4* 25.3*  MCV 88.2 90.4 89.9 90.0  PLT 290 266 295 221   Basic Metabolic Panel: Recent Labs  Lab 09/09/21 1207 09/10/21 0531 09/11/21 0628 09/12/21 0609  NA 140 142 142 142  K 4.0 4.2 3.6 3.6  CL 104 105 99 99  CO2 29 29 37* 38*  GLUCOSE 102* 93 37* 89  BUN 19 19 22 22   CREATININE 1.13* 1.14* 1.01* 0.96  CALCIUM 8.0* 7.9* 8.3* 7.8*   GFR: Estimated Creatinine Clearance: 35 mL/min (by C-G formula based on SCr of 0.96 mg/dL). Liver Function Tests: No results for input(s): "AST", "ALT", "ALKPHOS", "  BILITOT", "PROT", "ALBUMIN" in the last 168 hours. No results for input(s): "LIPASE", "AMYLASE" in the last 168 hours. No results for input(s): "AMMONIA" in the last 168 hours. Coagulation Profile: No results for input(s): "INR", "PROTIME" in the last 168 hours. Cardiac Enzymes: No results for input(s): "CKTOTAL", "CKMB", "CKMBINDEX", "TROPONINI" in the last 168 hours. BNP (last 3 results) No results for input(s): "PROBNP" in the last 8760 hours. HbA1C: Recent Labs    09/10/21 0531  HGBA1C 6.6*   CBG: Recent Labs  Lab 09/11/21 0738 09/11/21 0813 09/11/21 1137 09/11/21 1635 09/11/21 2036  GLUCAP 79 206* 155* 154* 200*   Lipid Profile: No results for input(s): "CHOL", "HDL",  "LDLCALC", "TRIG", "CHOLHDL", "LDLDIRECT" in the last 72 hours. Thyroid Function Tests: No results for input(s): "TSH", "T4TOTAL", "FREET4", "T3FREE", "THYROIDAB" in the last 72 hours. Anemia Panel: No results for input(s): "VITAMINB12", "FOLATE", "FERRITIN", "TIBC", "IRON", "RETICCTPCT" in the last 72 hours. Sepsis Labs: No results for input(s): "PROCALCITON", "LATICACIDVEN" in the last 168 hours.  No results found for this or any previous visit (from the past 240 hour(s)).       Radiology Studies: No results found.      Scheduled Meds:  aspirin EC  81 mg Oral Daily   atorvastatin  80 mg Oral QHS   cholecalciferol  5,000 Units Oral Daily   docusate sodium  200 mg Oral BID   enoxaparin (LOVENOX) injection  30 mg Subcutaneous Q24H   furosemide  40 mg Intravenous Daily   gabapentin  300 mg Oral Daily   insulin aspart  0-9 Units Subcutaneous TID AC & HS   insulin glargine-yfgn  10 Units Subcutaneous QHS   metoprolol tartrate  12.5 mg Oral BID   polyethylene glycol  17 g Oral Daily   traZODone  50 mg Oral QHS   Continuous Infusions:  iron sucrose 150 mg (09/11/21 1820)     LOS: 3 days    Time spent: 25 mins     Charise Killian, MD [facility] Pager [phone]  If 7PM-7AM, please contact night-coverage 09/12/2021, 7:44 AM

## 2021-09-13 DIAGNOSIS — I10 Essential (primary) hypertension: Secondary | ICD-10-CM | POA: Diagnosis not present

## 2021-09-13 DIAGNOSIS — D508 Other iron deficiency anemias: Secondary | ICD-10-CM | POA: Diagnosis not present

## 2021-09-13 DIAGNOSIS — I5031 Acute diastolic (congestive) heart failure: Secondary | ICD-10-CM | POA: Diagnosis not present

## 2021-09-13 LAB — TYPE AND SCREEN
ABO/RH(D): A POS
Antibody Screen: NEGATIVE
Unit division: 0

## 2021-09-13 LAB — BPAM RBC
Blood Product Expiration Date: 202307292359
ISSUE DATE / TIME: 202307021438
Unit Type and Rh: 6200

## 2021-09-13 LAB — CBC
HCT: 30.1 % — ABNORMAL LOW (ref 36.0–46.0)
Hemoglobin: 8.6 g/dL — ABNORMAL LOW (ref 12.0–15.0)
MCH: 25.2 pg — ABNORMAL LOW (ref 26.0–34.0)
MCHC: 28.6 g/dL — ABNORMAL LOW (ref 30.0–36.0)
MCV: 88.3 fL (ref 80.0–100.0)
Platelets: 208 10*3/uL (ref 150–400)
RBC: 3.41 MIL/uL — ABNORMAL LOW (ref 3.87–5.11)
RDW: 21.7 % — ABNORMAL HIGH (ref 11.5–15.5)
WBC: 8.3 10*3/uL (ref 4.0–10.5)
nRBC: 0 % (ref 0.0–0.2)

## 2021-09-13 LAB — GLUCOSE, CAPILLARY
Glucose-Capillary: 131 mg/dL — ABNORMAL HIGH (ref 70–99)
Glucose-Capillary: 129 mg/dL — ABNORMAL HIGH (ref 70–99)
Glucose-Capillary: 146 mg/dL — ABNORMAL HIGH (ref 70–99)
Glucose-Capillary: 212 mg/dL — ABNORMAL HIGH (ref 70–99)
Glucose-Capillary: 212 mg/dL — ABNORMAL HIGH (ref 70–99)

## 2021-09-13 LAB — BASIC METABOLIC PANEL
Anion gap: 8 (ref 5–15)
BUN: 19 mg/dL (ref 8–23)
CO2: 39 mmol/L — ABNORMAL HIGH (ref 22–32)
Calcium: 8.1 mg/dL — ABNORMAL LOW (ref 8.9–10.3)
Chloride: 98 mmol/L (ref 98–111)
Creatinine, Ser: 1 mg/dL (ref 0.44–1.00)
GFR, Estimated: 53 mL/min — ABNORMAL LOW (ref >60)
Glucose, Bld: 103 mg/dL — ABNORMAL HIGH (ref 70–99)
Potassium: 3.9 mmol/L (ref 3.5–5.1)
Sodium: 145 mmol/L (ref 135–145)

## 2021-09-13 MED ORDER — FERROUS SULFATE 325 (65 FE) MG PO TABS
325.0000 mg | ORAL_TABLET | Freq: Every day | ORAL | Status: DC
  Administered 2021-09-14: 325 mg via ORAL
  Filled 2021-09-13: qty 1

## 2021-09-13 NOTE — Plan of Care (Signed)

## 2021-09-13 NOTE — Progress Notes (Signed)
PROGRESS NOTE    Judy Harvey  KDX:[id number] DOB: 1927-08-06 DOA: 09/09/2021 PCP: Mick Sell, MD   Assessment & Plan:   Principal Problem:   Acute CHF (congestive heart failure) (HCC) Active Problems:   Syncope, vasovagal   Type 2 diabetes mellitus with peripheral neuropathy (HCC)   Benign essential HTN   Hyperlipidemia   GERD without esophagitis  Assessment and Plan: Acute on chronic diastolic CHF: as per echo 05/15/21.  Holding lasix today. Monitor I/Os. Received CHF education via CHF educator. Much improved    Vasovagal syncope: orthostatics positive 7/1. Holding lasix   Constipation: resolved  IDA: s/p 1 unit of pRBCs given. H&H trending up. S/p IV iron x 3 days. Will restart po iron  DM2: well controlled, HbA1c 6.6. Continue on glargine, SSI w/ accuchecks. Pt refused a carb modified & heart healthy diet. Continue on regular diet as per pt's request   HTN: continue on metoprolol   HLD: continue on statin    GERD: continue on PPI        DVT prophylaxis: lovenox  Code Status: full  Family Communication:  called pt's son, Loraine Leriche, no answer and unable to leave a voicemail  Disposition Plan: likely d/c to SNF   Level of care: Telemetry Cardiac  Status is: Inpatient Remains inpatient appropriate because: needs SNF placement     Consultants:    Procedures:   Antimicrobials:   Subjective: Pt c/o malaise   Objective: Vitals:   09/12/21 1936 09/12/21 1959 09/13/21 0328 09/13/21 0412  BP:   (!) 114/50   Pulse:   65   Resp:   20   Temp:   97.8 F (36.6 C)   TempSrc:   Oral   SpO2: 94% 95% 96%   Weight:    73 kg  Height:        Intake/Output Summary (Last 24 hours) at 09/13/2021 0803 Last data filed at 09/13/2021 0354 Gross per 24 hour  Intake 1196 ml  Output 801 ml  Net 395 ml   Filed Weights   09/11/21 0649 09/12/21 0445 09/13/21 0412  Weight: 73.5 kg 73.1 kg 73 kg    Examination:  General exam: Appears comfortable.  Blind Respiratory system: decreased breath sounds b/l  Cardiovascular system: S1/S2+. No rubs or clicks  Gastrointestinal system: Abd is soft, NT, ND & normal bowel sounds  Central nervous system: Alert and oriented. Moves all extremities  Psychiatry: Judgement and insight appears at baseline. Appropriate mood and affect    Data Reviewed: I have personally reviewed following labs and imaging studies  CBC: Recent Labs  Lab 09/09/21 1207 09/10/21 0531 09/11/21 0628 09/12/21 0609 09/12/21 1854 09/13/21 0512  WBC 9.6 8.5 11.3* 8.5  --  8.3  HGB 7.2* 7.3* 7.8* 7.0* 8.7* 8.6*  HCT 26.1* 26.5* 28.4* 25.3* 31.3* 30.1*  MCV 88.2 90.4 89.9 90.0  --  88.3  PLT 290 266 295 221  --  208   Basic Metabolic Panel: Recent Labs  Lab 09/09/21 1207 09/10/21 0531 09/11/21 0628 09/12/21 0609 09/13/21 0512  NA 140 142 142 142 145  K 4.0 4.2 3.6 3.6 3.9  CL 104 105 99 99 98  CO2 29 29 37* 38* 39*  GLUCOSE 102* 93 37* 89 103*  BUN 19 19 22 22 19   CREATININE 1.13* 1.14* 1.01* 0.96 1.00  CALCIUM 8.0* 7.9* 8.3* 7.8* 8.1*   GFR: Estimated Creatinine Clearance: 33.6 mL/min (by C-G formula based on SCr of 1 mg/dL). Liver Function Tests:  No results for input(s): "AST", "ALT", "ALKPHOS", "BILITOT", "PROT", "ALBUMIN" in the last 168 hours. No results for input(s): "LIPASE", "AMYLASE" in the last 168 hours. No results for input(s): "AMMONIA" in the last 168 hours. Coagulation Profile: No results for input(s): "INR", "PROTIME" in the last 168 hours. Cardiac Enzymes: No results for input(s): "CKTOTAL", "CKMB", "CKMBINDEX", "TROPONINI" in the last 168 hours. BNP (last 3 results) No results for input(s): "PROBNP" in the last 8760 hours. HbA1C: No results for input(s): "HGBA1C" in the last 72 hours.  CBG: Recent Labs  Lab 09/11/21 2036 09/12/21 0829 09/12/21 1155 09/12/21 1559 09/12/21 2112  GLUCAP 200* 71 132* 170* 139*   Lipid Profile: No results for input(s): "CHOL", "HDL", "LDLCALC",  "TRIG", "CHOLHDL", "LDLDIRECT" in the last 72 hours. Thyroid Function Tests: No results for input(s): "TSH", "T4TOTAL", "FREET4", "T3FREE", "THYROIDAB" in the last 72 hours. Anemia Panel: No results for input(s): "VITAMINB12", "FOLATE", "FERRITIN", "TIBC", "IRON", "RETICCTPCT" in the last 72 hours. Sepsis Labs: No results for input(s): "PROCALCITON", "LATICACIDVEN" in the last 168 hours.  No results found for this or any previous visit (from the past 240 hour(s)).       Radiology Studies: No results found.      Scheduled Meds:  aspirin EC  81 mg Oral Daily   atorvastatin  80 mg Oral QHS   cholecalciferol  5,000 Units Oral Daily   docusate sodium  200 mg Oral BID   enoxaparin (LOVENOX) injection  40 mg Subcutaneous Q24H   furosemide  40 mg Intravenous Daily   gabapentin  300 mg Oral Daily   insulin aspart  0-9 Units Subcutaneous TID AC & HS   insulin glargine-yfgn  10 Units Subcutaneous QHS   metoprolol tartrate  12.5 mg Oral BID   polyethylene glycol  17 g Oral Daily   traZODone  50 mg Oral QHS   Continuous Infusions:  iron sucrose 150 mg (09/12/21 1813)     LOS: 4 days    Time spent: 28 mins     Charise Killian, MD [facility] Pager [phone]  If 7PM-7AM, please contact night-coverage 09/13/2021, 8:03 AM

## 2021-09-13 NOTE — Care Management Important Message (Signed)
Important Message  Patient Details  Name: Judy Harvey MRN: [id number] Date of Birth: September 06, 1927   Medicare Important Message Given:  Yes     [name] 09/13/2021, 12:01 PM

## 2021-09-13 NOTE — Progress Notes (Signed)
Physical Therapy Treatment Patient Details Name: Judy Harvey MRN: [id number] DOB: 11/15/1927 Today's Date: 09/13/2021   History of Present Illness Pt is a 88 y/o F admitted on 09/09/21 after presenting with c/o syncope & worsening dyspnea, BLE edema. Pt is being treated for acute on chronic diastolic CHF & vasovagal syncope. PMH: DM, depression, GERD, COPD, HTN, dyslipidemia, CVA, Covid-19, legally blind, B macular degeneration    PT Comments    Pt seen for PT tx with pt agreeable. Pt follows simple commands with extra time throughout session. Pt requires min assist for supine>sit with HOB elevated with pt limited by c/o LUE pain. Pt requires min assist for STS & step pivot to recliner with pt reaching for support for other UE. Pt limited by urinary incontinence upon each STS with PT assisting with changing into clean clothing. During session pt also c/o nausea & being unable to take a deep breath. Pt left in chair with MD & nurse in room. Nurse notified of pt's increasing BP but pt denying symptoms.  BP checked in LUE: Supine: 164/80 mmHg (MAP 105), HR 66 bpm Sitting: 156/109 mmHg (MAP 120), HR 75 bpm Sitting in recliner after transfer: 153/132 mmHg (MAP 141)      Recommendations for follow up therapy are one component of a multi-disciplinary discharge planning process, led by the attending physician.  Recommendations may be updated based on patient status, additional functional criteria and insurance authorization.  Follow Up Recommendations  Skilled nursing-short term rehab (<3 hours/day) Can patient physically be transported by private vehicle: No   Assistance Recommended at Discharge Frequent or constant Supervision/Assistance  Patient can return home with the following A lot of help with walking and/or transfers;A lot of help with bathing/dressing/bathroom;Assist for transportation;Assistance with feeding;Assistance with cooking/housework;Direct supervision/assist for medications  management;Help with stairs or ramp for entrance   Equipment Recommendations  None recommended by PT    Recommendations for Other Services       Precautions / Restrictions Precautions Precautions: Fall Precaution Comments: watch orthostatics Restrictions Weight Bearing Restrictions: No     Mobility  Bed Mobility Overal bed mobility: Needs Assistance Bed Mobility: Supine to Sit     Supine to sit: Min assist, HOB elevated          Transfers Overall transfer level: Needs assistance Equipment used: 1 person hand held assist Transfers: Sit to/from Stand, Bed to chair/wheelchair/BSC Sit to Stand: Min assist   Step pivot transfers: Min assist            Ambulation/Gait                   Stairs             Wheelchair Mobility    Modified Rankin (Stroke Patients Only)       Balance Overall balance assessment: Needs assistance Sitting-balance support: Feet supported, Bilateral upper extremity supported Sitting balance-Leahy Scale: Fair Sitting balance - Comments: supervision static sitting   Standing balance support: Single extremity supported, Bilateral upper extremity supported, During functional activity Standing balance-Leahy Scale: Poor                              Cognition Arousal/Alertness: Awake/alert Behavior During Therapy: WFL for tasks assessed/performed Overall Cognitive Status: No family/caregiver present to determine baseline cognitive functioning                         Following  Commands: Follows one step commands with increased time   Awareness: Intellectual            Exercises      General Comments General comments (skin integrity, edema, etc.): pt on 3L/min via nasal cannula with SPO2 >90% throughout. Pt with urinary incontinence upon each standing attempt.      Pertinent Vitals/Pain Pain Assessment Pain Assessment: Faces Faces Pain Scale: Hurts little more Pain Location: L  arm Pain Descriptors / Indicators: Discomfort Pain Intervention(s): Repositioned, Monitored during session    Home Living                          Prior Function            PT Goals (current goals can now be found in the care plan section) Acute Rehab PT Goals Patient Stated Goal: get better PT Goal Formulation: With patient Time For Goal Achievement: 09/25/21 Potential to Achieve Goals: Good Progress towards PT goals: Progressing toward goals    Frequency    Min 2X/week      PT Plan Current plan remains appropriate    Co-evaluation              AM-PAC PT "6 Clicks" Mobility   Outcome Measure  Help needed turning from your back to your side while in a flat bed without using bedrails?: A Little Help needed moving from lying on your back to sitting on the side of a flat bed without using bedrails?: A Little Help needed moving to and from a bed to a chair (including a wheelchair)?: A Little Help needed standing up from a chair using your arms (e.g., wheelchair or bedside chair)?: A Little Help needed to walk in hospital room?: A Lot Help needed climbing 3-5 steps with a railing? : A Lot 6 Click Score: 16    End of Session Equipment Utilized During Treatment: Oxygen Activity Tolerance: Patient tolerated treatment well Patient left: in chair;with nursing/sitter in room Nurse Communication: Mobility status (notified nurse of BP) PT Visit Diagnosis: Unsteadiness on feet (R26.81);Muscle weakness (generalized) (M62.81);History of falling (Z91.81)     Time: 5465-0354 PT Time Calculation (min) (ACUTE ONLY): 16 min  Charges:  $Therapeutic Activity: 8-22 mins                     Aleda Grana, PT, DPT 09/13/21, 10:20 AM   Sandi Mariscal 09/13/2021, 10:18 AM

## 2021-09-14 DIAGNOSIS — E1142 Type 2 diabetes mellitus with diabetic polyneuropathy: Secondary | ICD-10-CM | POA: Diagnosis not present

## 2021-09-14 DIAGNOSIS — I10 Essential (primary) hypertension: Secondary | ICD-10-CM | POA: Diagnosis not present

## 2021-09-14 DIAGNOSIS — I5031 Acute diastolic (congestive) heart failure: Secondary | ICD-10-CM | POA: Diagnosis not present

## 2021-09-14 LAB — BASIC METABOLIC PANEL
Anion gap: 5 (ref 5–15)
BUN: 20 mg/dL (ref 8–23)
CO2: 41 mmol/L — ABNORMAL HIGH (ref 22–32)
Calcium: 8.1 mg/dL — ABNORMAL LOW (ref 8.9–10.3)
Chloride: 95 mmol/L — ABNORMAL LOW (ref 98–111)
Creatinine, Ser: 0.79 mg/dL (ref 0.44–1.00)
GFR, Estimated: >60 mL/min (ref >60)
Glucose, Bld: 157 mg/dL — ABNORMAL HIGH (ref 70–99)
Potassium: 3.7 mmol/L (ref 3.5–5.1)
Sodium: 141 mmol/L (ref 135–145)

## 2021-09-14 LAB — CBC
HCT: 31.5 % — ABNORMAL LOW (ref 36.0–46.0)
Hemoglobin: 8.9 g/dL — ABNORMAL LOW (ref 12.0–15.0)
MCH: 25.1 pg — ABNORMAL LOW (ref 26.0–34.0)
MCHC: 28.3 g/dL — ABNORMAL LOW (ref 30.0–36.0)
MCV: 89 fL (ref 80.0–100.0)
Platelets: 181 10*3/uL (ref 150–400)
RBC: 3.54 MIL/uL — ABNORMAL LOW (ref 3.87–5.11)
RDW: 22.1 % — ABNORMAL HIGH (ref 11.5–15.5)
WBC: 8.1 10*3/uL (ref 4.0–10.5)
nRBC: 0 % (ref 0.0–0.2)

## 2021-09-14 LAB — GLUCOSE, CAPILLARY
Glucose-Capillary: 180 mg/dL — ABNORMAL HIGH (ref 70–99)
Glucose-Capillary: 193 mg/dL — ABNORMAL HIGH (ref 70–99)

## 2021-09-14 MED ORDER — FUROSEMIDE 20 MG PO TABS: 20.0000 mg | ORAL_TABLET | Freq: Every day | ORAL | 0 refills | Status: AC

## 2021-09-14 MED ORDER — IPRATROPIUM-ALBUTEROL 0.5-2.5 (3) MG/3ML IN SOLN
3.0000 mL | Freq: Once | RESPIRATORY_TRACT | Status: AC
  Administered 2021-09-14: 3 mL via RESPIRATORY_TRACT
  Filled 2021-09-14: qty 3

## 2021-09-14 NOTE — Discharge Summary (Signed)
Physician Discharge Summary  Judy Harvey IFO:[id number] DOB: 1927/08/10 DOA: 09/09/2021  PCP: Mick Sell, MD  Admit date: 09/09/2021 Discharge date: 09/14/2021  Admitted From: home  Disposition:  SNF  Recommendations for Outpatient Follow-up:  Follow up with PCP in 1-2 weeks Outpatient heart failure clinic for which she has an appointment July 20 at 2:30 in the afternoon F/u w/ cardio in 2 weeks  Home Health: no  Equipment/Devices:  Discharge Condition: stable  CODE STATUS: DNR  Diet recommendation: Regular (omit carb modified/heart healthy as per pt's request)   Brief/Interim Summary: HPI was taken from Dr. Arville Care: Judy Harvey is a 88 y.o. Caucasian female with medical history significant for type diabetes mellitus, depression, GERD, COPD, hypertension, dyslipidemia and CVA, who presented to the emergency room with a [place] of syncope as well as recent worsening dyspnea, lower extremity edema as well as 3 pillow orthopnea and paroxysmal nocturnal dyspnea.  She admitted to mild cough and wheezing without dyspnea.  She had an unwitnessed accidental mechanical fall a couple of days ago and hit her head without syncope.  Today on her way to her primary care physician she had a brief syncopal episode.  She was having left wrist and knee pain after her fall.  No chest pain or palpitations.  No nausea or vomiting or abdominal pain.  No dysuria, oliguria or hematuria or flank pain.  She is on home O2 at 2 L/min at baseline for her COPD.   ED Course: When she came to the ER, vital signs were within normal.  BMP was unremarkable.  BN P was 1055.4 and high-sensitivity troponin I 16 and later 15.  CBC showed anemia better than previous levels with H&H of 7.2 and 26.1.  UA showed 6-10 WBCs and few bacteria with 30 protein. EKG as reviewed by me : Showed normal sinus rhythm with a rate of 62 with first-degree AV block and PACs, right bundle branch block and left anterior fascicular  block with bifascicular block and minimal voltage criteria for LVH. Imaging: Left knee and wrist x-rays were negative for fracture as mentioned below.  Noncontrasted head CT scan revealed no acute intracranial normalities.  C-spine CT showed no acute abnormalities.  It showed left pleural effusion and degenerative spondylosis and facet arthropathy.  Two-view chest x-ray showed aortic atherosclerosis, cardiomegaly without edema and hazy density in the lung bases is likely from layering pleural effusions.  The patient was given 20 mg of IV Lasix and DuoNebs.  He will be admitted to a cardiac telemetry bed for further evaluation and management.   As per Dr. Mayford Knife 6/30-09/14/21: Pt was found to have CHF exacerbation treated w/ IV lasix &metoprolol. Pt responded well to treatment. Pt did receive CHF education via CHF educator. Pt will f/u outpatient w/ heart failure clinic as well as cardio. Of note, pt did receive IV iron & 1 unit of pRBCs while inpatient for IDA. Pt will continue on po iron at d/c. PT/OT evaluated pt and recommended SNF.  For more information, please see previous progress/consult notes.   Discharge Diagnoses:  Principal Problem:   Acute CHF (congestive heart failure) (HCC) Active Problems:   Syncope, vasovagal   Type 2 diabetes mellitus with peripheral neuropathy (HCC)   Benign essential HTN   Hyperlipidemia   GERD without esophagitis Acute on chronic diastolic CHF: as per echo 05/15/21.  Continue on metoprolol, lasix po.  Monitor I/Os. Received CHF education via CHF educator. Much improved    Vasovagal syncope:  orthostatics positive 7/1. Resolved   Constipation: resolved  IDA: s/p 1 unit of pRBCs given. H&H trending up. S/p IV iron x 3 days. Continue on po iron   DM2: well controlled, HbA1c 6.6. Continue on home anti-DM meds. Pt refused a carb modified & heart healthy diet. Continue on regular diet as per pt's request and pt may continue on regular diet at SNF   HTN: continue  on metoprolol   HLD: continue on statin    GERD: continue on PPI   Obesity: BMI 30.8. Complicates overall care & prognosis    Discharge Instructions  Discharge Instructions     Diet - low sodium heart healthy   Complete by: As directed    Diet Carb Modified   Complete by: As directed    Diet general   Complete by: As directed    Discharge instructions   Complete by: As directed    F/u w/ PCP in 1-2 weeks. Outpatient heart failure clinic for which she has an appointment July 20 at 2:30 in the afternoon. F/u w/ cardio in 2 weeks   Increase activity slowly   Complete by: As directed       Allergies as of 09/14/2021       Reactions   Adhesive [tape] Other (See Comments)   "pulls my skin off" paper tape ok   Lyrica [pregabalin]    Body spasms        Medication List     STOP taking these medications    DULoxetine 30 MG capsule Commonly known as: CYMBALTA   traZODone 50 MG tablet Commonly known as: DESYREL       TAKE these medications    acetaminophen 325 MG tablet Commonly known as: TYLENOL Take 650 mg by mouth every 6 (six) hours as needed for mild pain or fever.   albuterol 108 (90 Base) MCG/ACT inhaler Commonly known as: VENTOLIN HFA Inhale 2 puffs into the lungs every 6 (six) hours as needed for shortness of breath or wheezing.   albuterol 1.25 MG/3ML nebulizer solution Commonly known as: ACCUNEB Take 3 mLs by nebulization every 6 (six) hours as needed for wheezing or shortness of breath.   aspirin EC 81 MG tablet Take 81 mg by mouth daily.   atorvastatin 80 MG tablet Commonly known as: LIPITOR Take 1 tablet (80 mg total) by mouth at bedtime.   ferrous sulfate 325 (65 FE) MG tablet Take 325 mg by mouth daily.   furosemide 20 MG tablet Commonly known as: Lasix Take 1 tablet (20 mg total) by mouth daily.   gabapentin 300 MG capsule Commonly known as: NEURONTIN Take 300 mg by mouth daily.   insulin glargine 100 UNIT/ML injection Commonly  known as: LANTUS Inject 0.1 mLs (10 Units total) into the skin at bedtime. What changed: how much to take   Melatonin 3 MG Caps Take 3 mg by mouth at bedtime.   metFORMIN 500 MG 24 hr tablet Commonly known as: GLUCOPHAGE-XR Take 500 mg by mouth 2 (two) times daily.   metoprolol tartrate 25 MG tablet Commonly known as: LOPRESSOR Take 0.5 tablets (12.5 mg total) by mouth 2 (two) times daily.   ondansetron 8 MG tablet Commonly known as: ZOFRAN Take 8 mg by mouth every 8 (eight) hours as needed for nausea or vomiting.   pantoprazole 40 MG tablet Commonly known as: PROTONIX Take 40 mg by mouth 2 (two) times daily.   polyethylene glycol 17 g packet Commonly known as: MIRALAX / GLYCOLAX Take  17 g by mouth daily.   triamcinolone ointment 0.1 % Commonly known as: KENALOG Apply 1 Application topically 2 (two) times daily.   Vitamin D3 125 MCG (5000 UT) Caps Take 1 capsule (5,000 Units total) by mouth daily.        Contact information for after-discharge care     Destination     [facility] Preferred SNF .   Service: Skilled Nursing Contact information: [address] [place] [phone]                    Allergies  Allergen Reactions   Adhesive [Tape] Other (See Comments)    "pulls my skin off" paper tape ok   Lyrica [Pregabalin]     Body spasms    Consultations:    Procedures/Studies: DG Chest 1 View  Result Date: 09/09/2021 CLINICAL DATA:  Syncope.  Frequent falls.  Peripheral edema. EXAM: CHEST  1 VIEW COMPARISON:  09/05/2021 FINDINGS: The patient is rotated to the right on today's radiograph, reducing diagnostic sensitivity and specificity. Retrocardiac density favoring moderate-sized hiatal hernia. Bifid distal right fourth rib. Atherosclerotic calcification of the aortic arch. Suspected mild enlargement of the cardiopericardial silhouette, without overt edema. Vaguely accentuated density at the lung  bases without obscuration of the diaphragms, probably from soft tissues of the chest wall, less likely from layering pleural effusions. IMPRESSION: 1. Mild enlargement of the cardiopericardial silhouette, without edema. 2.  Aortic Atherosclerosis (ICD10-I70.0). 3. Hazy density at the lung bases without obscuration of the diaphragms, probably due to soft tissues of the chest wall/body habitus, less likely from layering pleural effusions. 4. Bifid right anterior fourth rib. Electronically Signed   By: Gaylyn RongWalter  Liebkemann M.D.   On: 09/09/2021 17:00   CT Cervical Spine Wo Contrast  Result Date: 09/09/2021 CLINICAL DATA:  Syncopal of ad. Fall with trauma to the head and neck. EXAM: CT CERVICAL SPINE WITHOUT CONTRAST TECHNIQUE: Multidetector CT imaging of the cervical spine was performed without intravenous contrast. Multiplanar CT image reconstructions were also generated. RADIATION DOSE REDUCTION: This exam was performed according to the departmental dose-optimization program which includes automated exposure control, adjustment of the mA and/or kV according to patient size and/or use of iterative reconstruction technique. COMPARISON:  None FINDINGS: Alignment: No malalignment. Skull base and vertebrae: No evidence of regional fracture or focal bone lesion. Soft tissues and spinal canal: No traumatic soft tissue finding. Disc levels: Chronic osteoarthritis at the C1-2 articulation. Chronic degenerative spondylosis from C3-4 through C6-7. Chronic facet osteoarthritis on the left at C2-3 and C3-4. No compressive bony canal stenosis. Mild bony foraminal narrowing. Upper chest: Negative other than a left pleural effusion. Other: None IMPRESSION: No acute or traumatic finding. Ordinary degenerative spondylosis and facet arthritis. Left pleural effusion. Electronically Signed   By: Paulina FusiMark  Shogry M.D.   On: 09/09/2021 15:48   CT Head Wo Contrast  Result Date: 09/09/2021 CLINICAL DATA:  Syncopal event.  Frequent falling.  EXAM: CT HEAD WITHOUT CONTRAST TECHNIQUE: Contiguous axial images were obtained from the base of the skull through the vertex without intravenous contrast. RADIATION DOSE REDUCTION: This exam was performed according to the departmental dose-optimization program which includes automated exposure control, adjustment of the mA and/or kV according to patient size and/or use of iterative reconstruction technique. COMPARISON:  08/11/2021 FINDINGS: Brain: Generalized age related atrophy. Moderate chronic small-vessel ischemic changes of the cerebral hemispheric white matter. No sign of acute infarction, mass lesion, hemorrhage, hydrocephalus or extra-axial collection. Vascular: There  is atherosclerotic calcification of the major vessels at the base of the brain. Skull: No skull fracture. Sinuses/Orbits: Sinuses are clear.  Orbits are normal. Other: None IMPRESSION: No acute or traumatic finding. Age related atrophy. Chronic small-vessel ischemic changes of the cerebral hemispheric white matter. Electronically Signed   By: Paulina Fusi M.D.   On: 09/09/2021 15:46   DG Knee Complete 4 Views Left  Result Date: 09/09/2021 CLINICAL DATA:  Trauma, fall, pain EXAM: LEFT KNEE - COMPLETE 4+ VIEW COMPARISON:  None Available. FINDINGS: No recent fracture or dislocation is seen. There is no significant effusion in the suprapatellar bursa. Degenerative changes are noted with bony spurs in medial, lateral and patellofemoral compartments. Arterial calcifications are seen in the soft tissues. IMPRESSION: No recent fracture or dislocation is seen in the left knee. Degenerative changes are noted with bony spurs. There is no significant effusion. Electronically Signed   By: Ernie Avena M.D.   On: 09/09/2021 15:18   DG Wrist Complete Left  Result Date: 09/09/2021 CLINICAL DATA:  Trauma, fall, pain EXAM: LEFT WRIST - COMPLETE 3+ VIEW COMPARISON:  None Available. FINDINGS: No recent fracture or dislocation is seen. Severe  degenerative changes are noted in first carpometacarpal joint. IMPRESSION: No recent fracture or dislocation is seen in the left wrist. Severe degenerative changes are noted in first carpometacarpal joint. Electronically Signed   By: Ernie Avena M.D.   On: 09/09/2021 15:17   DG Chest 2 View  Result Date: 09/05/2021 CLINICAL DATA:  Short of breath.  COPD EXAM: CHEST - 2 VIEW COMPARISON:  08/11/2021 FINDINGS: Heart size upper normal. Negative for heart failure. Pulmonary hyperinflation. Lungs clear without infiltrate or effusion Moderate compression fracture approximately T12 is unchanged from prior studies. IMPRESSION: COPD.  No acute abnormality. Electronically Signed   By: Marlan Palau M.D.   On: 09/05/2021 11:53   (Echo, Carotid, EGD, Colonoscopy, ERCP)    Subjective: Pt c/o fatigue    Discharge Exam: Vitals:   09/14/21 0745 09/14/21 1153  BP: (!) 153/63 (!) 147/68  Pulse: 76 64  Resp: (!) 25 18  Temp: 97.9 F (36.6 C) 98.9 F (37.2 C)  SpO2: 100% 94%   Vitals:   09/13/21 2343 09/14/21 0445 09/14/21 0745 09/14/21 1153  BP: 133/87 129/85 (!) 153/63 (!) 147/68  Pulse: 69 66 76 64  Resp:  16 (!) 25 18  Temp:  (!) 97.2 F (36.2 C) 97.9 F (36.6 C) 98.9 F (37.2 C)  TempSrc:  Axillary  Oral  SpO2:  96% 100% 94%  Weight:  76.5 kg    Height:        General: Pt is alert, awake, not in acute distress Cardiovascular: S1/S2 +, no rubs, no gallops Respiratory: CTA bilaterally, no wheezing, no rhonchi Abdominal: Soft, NT, obese, bowel sounds + Extremities: no cyanosis    The results of significant diagnostics from this hospitalization (including imaging, microbiology, ancillary and laboratory) are listed below for reference.     Microbiology: No results found for this or any previous visit (from the past 240 hour(s)).   Labs: BNP (last 3 results) Recent Labs    05/18/21 2115 09/09/21 1207  BNP 400.4* 955.4*   Basic Metabolic Panel: Recent Labs  Lab  09/10/21 0531 09/11/21 0628 09/12/21 0609 09/13/21 0512 09/14/21 0557  NA 142 142 142 145 141  K 4.2 3.6 3.6 3.9 3.7  CL 105 99 99 98 95*  CO2 29 37* 38* 39* 41*  GLUCOSE 93 37* 89 103* 157*  BUN 19 22 22 19 20   CREATININE 1.14* 1.01* 0.96 1.00 0.79  CALCIUM 7.9* 8.3* 7.8* 8.1* 8.1*   Liver Function Tests: No results for input(s): "AST", "ALT", "ALKPHOS", "BILITOT", "PROT", "ALBUMIN" in the last 168 hours. No results for input(s): "LIPASE", "AMYLASE" in the last 168 hours. No results for input(s): "AMMONIA" in the last 168 hours. CBC: Recent Labs  Lab 09/10/21 0531 09/11/21 0628 09/12/21 0609 09/12/21 1854 09/13/21 0512 09/14/21 0557  WBC 8.5 11.3* 8.5  --  8.3 8.1  HGB 7.3* 7.8* 7.0* 8.7* 8.6* 8.9*  HCT 26.5* 28.4* 25.3* 31.3* 30.1* 31.5*  MCV 90.4 89.9 90.0  --  88.3 89.0  PLT 266 295 221  --  208 181   Cardiac Enzymes: No results for input(s): "CKTOTAL", "CKMB", "CKMBINDEX", "TROPONINI" in the last 168 hours. BNP: Invalid input(s): "POCBNP" CBG: Recent Labs  Lab 09/13/21 1230 09/13/21 1704 09/13/21 2033 09/14/21 0747 09/14/21 1154  GLUCAP 146* 212* 212* 180* 193*   D-Dimer No results for input(s): "DDIMER" in the last 72 hours. Hgb A1c No results for input(s): "HGBA1C" in the last 72 hours. Lipid Profile No results for input(s): "CHOL", "HDL", "LDLCALC", "TRIG", "CHOLHDL", "LDLDIRECT" in the last 72 hours. Thyroid function studies No results for input(s): "TSH", "T4TOTAL", "T3FREE", "THYROIDAB" in the last 72 hours.  Invalid input(s): "FREET3" Anemia work up No results for input(s): "VITAMINB12", "FOLATE", "FERRITIN", "TIBC", "IRON", "RETICCTPCT" in the last 72 hours. Urinalysis    Component Value Date/Time   COLORURINE AMBER (A) 09/09/2021 1349   APPEARANCEUR CLOUDY (A) 09/09/2021 1349   LABSPEC 1.026 09/09/2021 1349   PHURINE 5.0 09/09/2021 1349   GLUCOSEU NEGATIVE 09/09/2021 1349   HGBUR NEGATIVE 09/09/2021 1349   BILIRUBINUR NEGATIVE  09/09/2021 1349   BILIRUBINUR negative 02/19/2018 1557   KETONESUR NEGATIVE 09/09/2021 1349   PROTEINUR 30 (A) 09/09/2021 1349   UROBILINOGEN 0.2 02/19/2018 1557   NITRITE NEGATIVE 09/09/2021 1349   LEUKOCYTESUR NEGATIVE 09/09/2021 1349   Sepsis Labs Recent Labs  Lab 09/11/21 0628 09/12/21 0609 09/13/21 0512 09/14/21 0557  WBC 11.3* 8.5 8.3 8.1   Microbiology No results found for this or any previous visit (from the past 240 hour(s)).   Time coordinating discharge: Over 30 minutes  SIGNED:   11/15/21, MD  [facility] 09/14/2021, 12:45 PM Pager   If 7PM-7AM, please contact night-coverage [email or web address] Password [email or web address]

## 2021-09-14 NOTE — TOC Progression Note (Addendum)
Transition of Care [facility]) - Progression Note    Patient Details  Name: ABIA MONACO MRN: [id number] Date of Birth: 12/29/1927  [facility] CM/SW Contact  Truddie Hidden, RN Phone Number: 09/14/2021, 12:14 PM  Clinical Narrative:    80am Spoke with patient in reference to bed offer for [facility]. Patient request to contact her son Loraine Leriche. Family is agreeable to [facility].  10:41 am  [facility] auth started  12:13pm Vesta Mixer approved. Facility admissions coordinator contacted and approved admission for today. MD, Nurse and patient's son notified. Face sheet and Medcial necessity printed to the floor for EMS packet.   2:08pm EMS arranged.        Expected Discharge Plan: Home/Self Care (with PCS) Barriers to Discharge: Continued Medical Work up  Expected Discharge Plan and Services Expected Discharge Plan: Home/Self Care (with PCS)     Post Acute Care Choice: Resumption of Svcs/PTA Provider Living arrangements for the past 2 months: Single Family Home                                       Social Determinants of Health (SDOH) Interventions    Readmission Risk Interventions    09/10/2021    9:41 AM  Readmission Risk Prevention Plan  Transportation Screening Complete  PCP or Specialist Appt within 3-5 Days Complete  HRI or Home Care Consult Complete  Social Work Consult for Recovery Care Planning/Counseling Complete  Palliative Care Screening Not Applicable  Medication Review [employer]) Complete

## 2021-09-14 NOTE — Progress Notes (Addendum)
Report called to nurse Whitney Post at [facility], pt going to rm [phone].

## 2021-09-14 NOTE — Progress Notes (Signed)
Abrasion and bruise noted on left knee and left elbow from a previous fall at home.

## 2021-09-15 ENCOUNTER — Inpatient Hospital Stay: Payer: Medicare Other | Attending: Oncology

## 2021-09-15 MED FILL — Iron Sucrose Inj 20 MG/ML (Fe Equiv): INTRAVENOUS | Qty: 10 | Status: AC

## 2021-09-22 ENCOUNTER — Inpatient Hospital Stay: Payer: Medicare Other

## 2021-09-24 ENCOUNTER — Non-Acute Institutional Stay: Payer: Medicare Other | Admitting: Primary Care

## 2021-09-24 DIAGNOSIS — R531 Weakness: Secondary | ICD-10-CM

## 2021-09-24 DIAGNOSIS — J449 Chronic obstructive pulmonary disease, unspecified: Secondary | ICD-10-CM

## 2021-09-24 DIAGNOSIS — Z515 Encounter for palliative care: Secondary | ICD-10-CM

## 2021-09-24 DIAGNOSIS — E1122 Type 2 diabetes mellitus with diabetic chronic kidney disease: Secondary | ICD-10-CM

## 2021-09-24 DIAGNOSIS — I5031 Acute diastolic (congestive) heart failure: Secondary | ICD-10-CM

## 2021-09-24 DIAGNOSIS — Z794 Long term (current) use of insulin: Secondary | ICD-10-CM

## 2021-09-24 NOTE — Progress Notes (Signed)
[facility] Consult Note Telephone: [phone]  Fax: [phone]    Date of encounter: 09/24/21 1:28 PM PATIENT NAME: Judy Harvey [address] [place] [address]   [phone] (home)  DOB: 09/01/1927 MRN: [id number] PRIMARY CARE PROVIDER:    Leonel Ramsay, MD,  [place] [address] [phone]  REFERRING PROVIDER:   Dr Elsie Lincoln [place]    RESPONSIBLE PARTY:    Contact Information     Name Relation Home Work [employer] Son [phone]  [phone]       I met face to face with patient  in [facility] facility. Palliative Care was asked to follow this patient by consultation request of  Rica Koyanagi MD to address advance care planning and complex medical decision making. This is a follow up visit.                                   ASSESSMENT AND PLAN / RECOMMENDATIONS:   Advance Care Planning/Goals of Care: Goals include to maximize quality of life and symptom management. Patient/health care surrogate gave his/her permission to discuss.Our advance care planning conversation included a discussion about:     Exploration of personal, cultural or spiritual beliefs that might influence medical decisions  Identification of a healthcare agent  CODE STATUS: DNR I  REVIEWED a MOST form today. The patient and family outlined their wishes for the following treatment decisions:  Cardiopulmonary Resuscitation: Do Not Attempt Resuscitation (DNR/No CPR)  Medical Interventions: Comfort Measures: Keep clean, warm, and dry. Use medication by any route, positioning, wound care, and other measures to relieve pain and suffering. Use oxygen, suction and manual treatment of airway obstruction as needed for comfort. Do not transfer to the hospital unless comfort needs cannot be met in current location.  Antibiotics: Determine use of limitation of antibiotics when infection occurs  IV  Fluids: IV fluids for a defined trial period  Feeding Tube: No feeding tube    Symptom Management/Plan:  I met with patient who was having her lunch. She endorses having a heart issue which landed her in the hospital again,and in the SNF. She endorses good appetite, and states she's making process with walking. She is anticipating going home next week. I will continue to follow her at home, and recommend home health to continue.  She denies cardiac symptoms, and recounts her passing out at her doctor's office. Glucose levels have been erratic, from 111 to over 500 mg/ dl.   Follow up Palliative Care Visit: Palliative care will continue to follow for complex medical decision making, advance care planning, and clarification of goals. Return 2 weeks or prn.  I spent 25 minutes providing this consultation. More than 50% of the time in this consultation was spent in counseling and care coordination.  PPS: 40%  HOSPICE ELIGIBILITY/DIAGNOSIS: TBD  Chief Complaint: CHF, syncope, recent fall  HISTORY OF PRESENT ILLNESS:  Judy Harvey is a 88 y.o. year old female  with CHF, DM, immobility, frequent falls . Patient seen today to review palliative care needs to include medical decision making and advance care planning as appropriate.   History obtained from review of EMR, discussion with primary team, and interview with family, facility staff/caregiver and/or Judy Harvey.  I reviewed available labs, medications, imaging, studies and related documents from the EMR.  Records reviewed and summarized above.  ROS  General: NAD EYES: denies vision changes, endorses very low vision ENMT: denies dysphagia Cardiovascular: denies chest pain, denies DOE Pulmonary: denies cough, denies increased SOB Abdomen: endorses good appetite, denies constipation, endorses continence of bowel GU: denies dysuria, endorses continence of urine MSK:  denies  increased weakness,  + falls reported Skin: denies rashes  or wounds Neurological: denies pain, denies insomnia Psych: Endorses positive mood  Physical Exam: Current and past weights: 150 lbs, daily weights due to edema Constitutional: NAD General: frail appearing, WNWD EYES: anicteric sclera, lids intact, no discharge  ENMT:  hard of  hearing, oral mucous membranes moist, dentition intact CV: no LE edema Pulmonary: no increased work of breathing, no cough, room air Abdomen: intake 50-75%, normo-active BS + 4 quadrants, soft and non tender, no ascites MSK: mild sarcopenia, moves all extremities, ambulatory with walker Skin: warm and dry, no rashes or wounds on visible skin Neuro:  + generalized weakness,  no cognitive impairment, non-anxious affect  Thank you for the opportunity to participate in the care of Judy Harvey.  The palliative care team will continue to follow. Please call our office at [phone] if we can be of additional assistance.   Jason Coop, NP DNP, AGPCNP-BC  COVID-19 PATIENT SCREENING TOOL Asked and negative response unless otherwise noted:   Have you had symptoms of covid, tested positive or been in contact with someone with symptoms/positive test in the past 5-10 days?

## 2021-09-28 MED FILL — Iron Sucrose Inj 20 MG/ML (Fe Equiv): INTRAVENOUS | Qty: 10 | Status: AC

## 2021-09-29 ENCOUNTER — Inpatient Hospital Stay: Payer: Medicare Other

## 2021-09-30 ENCOUNTER — Ambulatory Visit: Payer: Medicare Other | Attending: Family | Admitting: Family

## 2021-09-30 ENCOUNTER — Encounter: Payer: Self-pay | Admitting: Family

## 2021-09-30 VITALS — BP 95/53 | HR 63 | Resp 18 | Ht 62.0 in | Wt 152.5 lb

## 2021-09-30 DIAGNOSIS — Z8673 Personal history of transient ischemic attack (TIA), and cerebral infarction without residual deficits: Secondary | ICD-10-CM | POA: Diagnosis not present

## 2021-09-30 DIAGNOSIS — I639 Cerebral infarction, unspecified: Secondary | ICD-10-CM | POA: Diagnosis not present

## 2021-09-30 DIAGNOSIS — Z7984 Long term (current) use of oral hypoglycemic drugs: Secondary | ICD-10-CM | POA: Insufficient documentation

## 2021-09-30 DIAGNOSIS — E119 Type 2 diabetes mellitus without complications: Secondary | ICD-10-CM | POA: Diagnosis not present

## 2021-09-30 DIAGNOSIS — Z87891 Personal history of nicotine dependence: Secondary | ICD-10-CM | POA: Insufficient documentation

## 2021-09-30 DIAGNOSIS — H3530 Unspecified macular degeneration: Secondary | ICD-10-CM | POA: Insufficient documentation

## 2021-09-30 DIAGNOSIS — D649 Anemia, unspecified: Secondary | ICD-10-CM | POA: Insufficient documentation

## 2021-09-30 DIAGNOSIS — K219 Gastro-esophageal reflux disease without esophagitis: Secondary | ICD-10-CM | POA: Diagnosis not present

## 2021-09-30 DIAGNOSIS — E785 Hyperlipidemia, unspecified: Secondary | ICD-10-CM | POA: Insufficient documentation

## 2021-09-30 DIAGNOSIS — H548 Legal blindness, as defined in USA: Secondary | ICD-10-CM | POA: Insufficient documentation

## 2021-09-30 DIAGNOSIS — Z79899 Other long term (current) drug therapy: Secondary | ICD-10-CM | POA: Diagnosis not present

## 2021-09-30 DIAGNOSIS — I5032 Chronic diastolic (congestive) heart failure: Secondary | ICD-10-CM

## 2021-09-30 DIAGNOSIS — I110 Hypertensive heart disease with heart failure: Secondary | ICD-10-CM | POA: Insufficient documentation

## 2021-09-30 DIAGNOSIS — Z794 Long term (current) use of insulin: Secondary | ICD-10-CM

## 2021-09-30 DIAGNOSIS — F32A Depression, unspecified: Secondary | ICD-10-CM | POA: Insufficient documentation

## 2021-09-30 DIAGNOSIS — R0602 Shortness of breath: Secondary | ICD-10-CM | POA: Diagnosis not present

## 2021-09-30 DIAGNOSIS — I10 Essential (primary) hypertension: Secondary | ICD-10-CM | POA: Diagnosis not present

## 2021-09-30 NOTE — Patient Instructions (Addendum)
Begin weighing daily and call for an overnight weight gain of 3 pounds or more or a weekly weight gain of more than 5 pounds.   If you have voicemail, please make sure your mailbox is cleaned out so that we may leave a message and please make sure to listen to any voicemails.    Use Mrs Judy Harvey for seasoning   Drink 60-64 ounces of fluid daily   Bring ALL medications including any over the counter or vitamins to every visit.

## 2021-09-30 NOTE — Progress Notes (Signed)
Patient ID: Judy Harvey, female    DOB: January 12, 1928, 88 y.o.   MRN: [id number]  HPI  Judy Harvey is a 88 y/o female with a history of DM, hyperlipidemia, HTN, stroke, anemia, depression, GERD, legally blind, macular degeneration, stroke, previous tobacco use and chronic heart failure.   Echo report from 05/19/21 reviewed and showed an EF of 55-60%  Admitted 09/09/21 due to syncope as well as recent worsening dyspnea, lower extremity edema as well as 3 pillow orthopnea and paroxysmal nocturnal dyspnea. Noncontrasted head CT scan revealed no acute intracranial normalities. C-spine CT showed no acute abnormalities. Given IV lasix and nebulizers. Transitioned to oral diuretics. Given IV iron & 1 unit of pRBCs while for IDA. PT/ OT evaluations done. Discharged after 5 days.   Judy Harvey presents today for her initial visit with a chief complaint of minimal fatigue upon moderate exertion. Judy Harvey has associated shortness of breath, nausea and light-headedness along with this. Judy Harvey denies any difficulty sleeping, abdominal distention, palpitations, pedal edema, chest pain or cough.   Just got home from [facility] a few days ago. They are going to stop back at [facility] on the way home today and pick up her scale that was left there.   Judy Harvey is trying to not add much salt to her food. Judy Harvey is unsure of fluid intake but caregiver that is present feels like Judy Harvey's drinking too much.   Past Medical History:  Diagnosis Date   Anemia    CHF (congestive heart failure) (HCC)    Depression    Diabetes mellitus without complication (HCC)    GERD (gastroesophageal reflux disease)    History of COVID-19    Hyperlipidemia    Hypertension    Legally blind    Macular degeneration, bilateral    Peripheral neuropathy    Post herpetic neuralgia    located in her back   Stroke [facility])    Past Surgical History:  Procedure Laterality Date   ABDOMINAL HYSTERECTOMY     partial   APPENDECTOMY     BREAST SURGERY     reduction    CAROTID PTA/STENT INTERVENTION Left 10/18/2017   Procedure: CAROTID PTA/STENT INTERVENTION;  Surgeon: Annice Needy, MD;  Location: [facility] INVASIVE CV LAB;  Service: Cardiovascular;  Laterality: Left;   CATARACT EXTRACTION, BILATERAL     CESAREAN SECTION     x3   COLONOSCOPY     ESOPHAGEAL DILATION     Family History  Problem Relation Age of Onset   Healthy Mother    Diabetes Father    Alzheimer's disease Father    Multiple sclerosis Sister    Multiple sclerosis Brother    Social History   Tobacco Use   Smoking status: Former    Packs/day: 3.00    Years: 50.00    Total pack years: 150.00    Types: Cigarettes    Quit date: 2008    Years since quitting: 15.5   Smokeless tobacco: Never   Tobacco comments:    smoking cessation materials not required  Substance Use Topics   Alcohol use: No   Allergies  Allergen Reactions   Adhesive [Tape] Other (See Comments)    "pulls my skin off" paper tape ok   Lyrica [Pregabalin]     Body spasms   Prior to Admission medications   Medication Sig Start Date End Date Taking? Authorizing Provider  acetaminophen (TYLENOL) 325 MG tablet Take 650 mg by mouth every 6 (six) hours as needed for mild pain or  fever.   Yes [provider]  albuterol (ACCUNEB) 1.25 MG/3ML nebulizer solution Take 3 mLs by nebulization every 6 (six) hours as needed for wheezing or shortness of breath. 11/12/19  Yes [provider]  albuterol (VENTOLIN HFA) 108 (90 Base) MCG/ACT inhaler Inhale 2 puffs into the lungs every 6 (six) hours as needed for shortness of breath or wheezing. 11/06/19  Yes [provider]  Amino Acids-Protein Hydrolys (PRO-STAT SUGAR FREE PO) Take 30 mLs by mouth 2 (two) times daily.   Yes [provider]  aspirin EC 81 MG tablet Take 81 mg by mouth daily.   Yes [provider]  atorvastatin (LIPITOR) 80 MG tablet Take 1 tablet (80 mg total) by mouth at bedtime. 05/22/21  Yes Hongalgi, Maximino Greenland, MD   Cholecalciferol (VITAMIN D3) 5000 units CAPS Take 1 capsule (5,000 Units total) by mouth daily. 06/28/16  Yes Plonk, Chrissie Noa, MD  docusate sodium (COLACE) 100 MG capsule Take 100 mg by mouth daily.   Yes [provider]  ferrous sulfate 325 (65 FE) MG tablet Take 325 mg by mouth daily.   Yes [provider]  gabapentin (NEURONTIN) 300 MG capsule Take 300 mg by mouth daily.   Yes [provider]  insulin glargine (LANTUS) 100 UNIT/ML injection Inject 0.1 mLs (10 Units total) into the skin at bedtime. Patient taking differently: Inject 44 Units into the skin at bedtime. 05/25/21  Yes Arnetha Courser, MD  Melatonin 3 MG CAPS Take 3 mg by mouth at bedtime.   Yes [provider]  metFORMIN (GLUCOPHAGE-XR) 500 MG 24 hr tablet Take 500 mg by mouth 2 (two) times daily. 03/23/21  Yes [provider]  metoprolol tartrate (LOPRESSOR) 25 MG tablet Take 0.5 tablets (12.5 mg total) by mouth 2 (two) times daily. 05/22/21  Yes Hongalgi, Maximino Greenland, MD  ondansetron (ZOFRAN) 8 MG tablet Take 8 mg by mouth every 8 (eight) hours as needed for nausea or vomiting.    Yes [provider]  pantoprazole (PROTONIX) 40 MG tablet Take 40 mg by mouth 2 (two) times daily.   Yes [provider]  traZODone (DESYREL) 50 MG tablet Take 50 mg by mouth at bedtime.   Yes [provider]  triamcinolone ointment (KENALOG) 0.1 % Apply 1 Application topically 2 (two) times daily. 08/31/21  Yes [provider]  furosemide (LASIX) 20 MG tablet Take 1 tablet (20 mg total) by mouth daily. Patient not taking: Reported on 09/30/2021 09/14/21 10/14/21  Charise Killian, MD  polyethylene glycol (MIRALAX / GLYCOLAX) 17 g packet Take 17 g by mouth daily. Patient not taking: Reported on 09/30/2021 05/23/21   Elease Etienne, MD   Review of Systems  Constitutional:  Positive for fatigue. Negative for appetite change.  HENT:  Positive for hearing loss. Negative for congestion,  postnasal drip and sore throat.   Eyes:  Positive for visual disturbance (due to macular degeneration).  Respiratory:  Positive for shortness of breath. Negative for cough and chest tightness.   Cardiovascular:  Negative for chest pain, palpitations and leg swelling.  Gastrointestinal:  Positive for nausea. Negative for abdominal distention and abdominal pain.  Endocrine: Negative.   Genitourinary: Negative.   Musculoskeletal:  Negative for back pain and neck pain.  Skin:  Positive for wound (left elbow due to fall).  Allergic/Immunologic: Negative.   Neurological:  Positive for light-headedness (at times). Negative for dizziness.  Hematological:  Negative for adenopathy. Does not bruise/bleed easily.  Psychiatric/Behavioral:  Negative  for dysphoric mood and sleep disturbance (sleeping on 2-3 pillows). The patient is not nervous/anxious.    Vitals:   09/30/21 1442  BP: (!) 95/53  Pulse: 63  Resp: 18  SpO2: 97%  Weight: 152 lb 8 oz (69.2 kg)  Height: 5\' 2"  (1.575 m)   Wt Readings from Last 3 Encounters:  09/30/21 152 lb 8 oz (69.2 kg)  09/14/21 160 lb (72.6 kg)  09/05/21 158 lb 8.2 oz (71.9 kg)   Lab Results  Component Value Date   CREATININE 0.79 09/14/2021   CREATININE 1.00 09/13/2021   CREATININE 0.96 09/12/2021   Physical Exam Vitals and nursing note reviewed. Exam conducted with a chaperone present (son and caregiver).  Constitutional:      Appearance: Normal appearance.  HENT:     Head: Normocephalic and atraumatic.     Right Ear: Decreased hearing noted.     Left Ear: Decreased hearing noted.  Cardiovascular:     Rate and Rhythm: Normal rate and regular rhythm.  Pulmonary:     Effort: Pulmonary effort is normal. No respiratory distress.     Breath sounds: No wheezing or rales.  Abdominal:     General: There is no distension.     Palpations: Abdomen is soft.  Musculoskeletal:        General: No tenderness.     Cervical back: Normal range of motion and neck  supple.     Right lower leg: No edema.     Left lower leg: No edema.  Skin:    General: Skin is warm and dry.     Comments: Bandage on left elbow  Neurological:     General: No focal deficit present.     Mental Status: Judy Harvey is alert and oriented to person, place, and time.  Psychiatric:        Mood and Affect: Mood normal.        Behavior: Behavior normal.        Thought Content: Thought content normal.   Assessment & Plan:  1: Chronic heart failure with preserved ejection fraction without structural changes- - NYHA class II - euvolemic today - not weighing daily but they are picking up their scale that was left at [facility]; understands to call for an overnight weight gain of > 2 pounds or a weekly weight gain of > 5 pounds - reviewed the importance of not adding salt and that Judy Harvey could use Mrs 11/13/2021 seasoning; reviewed the importance of reading food labels to keep daily sodium intake to 2000mg  - unsure of fluid intake but caregiver feels like Judy Harvey's drinking too much; instructed to measure how many ounces her cup holds so that Judy Harvey can keep daily fluid intake to 60-64 ounces - currently not taking a diuretic - BNP 09/09/21 was 955.4  2: HTN- - BP on the low side (95/53); encouraged slow position changes - saw PCP 09/11/21) 09/09/21  - BMP 09/14/21 reviewed and showed sodium 141, potassium 3.7, creatinine 0.79 and GFR >60  3: DM- - A1c 09/10/21 was 6.6% - taking metformin and using lantus insulin  4: Stroke- - palliative care visit done 09/24/21  5: Anemia- - saw hematology 09/26/21) 08/30/21 - Hg 09/14/21 was 8.9  Patient did not bring her medications nor a list. Each medication was verbally reviewed with the patient and Judy Harvey was encouraged to bring the bottles to every visit to confirm accuracy of list.   Return in 6 weeks, sooner if needed.

## 2021-10-04 ENCOUNTER — Other Ambulatory Visit: Payer: Medicare Other | Admitting: Primary Care

## 2021-10-04 ENCOUNTER — Telehealth: Payer: Self-pay

## 2021-10-04 DIAGNOSIS — R531 Weakness: Secondary | ICD-10-CM

## 2021-10-04 DIAGNOSIS — S51802S Unspecified open wound of left forearm, sequela: Secondary | ICD-10-CM

## 2021-10-04 DIAGNOSIS — D649 Anemia, unspecified: Secondary | ICD-10-CM

## 2021-10-04 DIAGNOSIS — J449 Chronic obstructive pulmonary disease, unspecified: Secondary | ICD-10-CM

## 2021-10-04 DIAGNOSIS — E1142 Type 2 diabetes mellitus with diabetic polyneuropathy: Secondary | ICD-10-CM

## 2021-10-04 DIAGNOSIS — S81802S Unspecified open wound, left lower leg, sequela: Secondary | ICD-10-CM

## 2021-10-04 DIAGNOSIS — Z515 Encounter for palliative care: Secondary | ICD-10-CM

## 2021-10-04 DIAGNOSIS — I509 Heart failure, unspecified: Secondary | ICD-10-CM

## 2021-10-04 NOTE — Telephone Encounter (Signed)
203 pm.  Patient requesting a portable oxygen concentrator like [employer] from [facility] NP.  Follow up call made to [facility] to see if this is covered by insurance.  Spoke with Victorino Dike with [employer] and portable oxygen concentrator is not covered by insurance.  Monthly rental fee is $250.  Home fill system is covered under insurance.  I contacted patient to advise her of above.  She requested that I speak with her son Loraine Leriche.  Explained above to [place] and they would like to have the homefill system since it is covered.    F2F, Demographics and order sent to [facility].

## 2021-10-04 NOTE — Progress Notes (Signed)
[facility] Palliative Care Consult Note Telephone: [phone]  Fax: [phone]    Date of encounter: 10/04/21 11:47 AM PATIENT NAME: Judy Harvey [address] [place] [address]   [phone] (home)  DOB: 1927-10-07 MRN: [id number] PRIMARY CARE PROVIDER:    Leonel Ramsay, MD,  [place] [address] [phone]  REFERRING PROVIDER:   Leonel Ramsay, MD [place],  [place] [address] [phone]  RESPONSIBLE PARTY:    Contact Information     Name Relation Home Work [place] Son [phone]  [phone]       I met face to face with patient and family in  home.  Palliative Care was asked to follow this patient by consultation request of  Judy Ramsay, MD to address advance care planning and complex medical decision making. This is a follow up visit.                                   ASSESSMENT AND PLAN / RECOMMENDATIONS:   Advance Care Planning/Goals of Care: Goals include to maximize quality of life and symptom management. Patient/health care surrogate gave his/her permission to discuss.Our advance care planning conversation included a discussion about:    The value and importance of advance care planning  Exploration of personal, cultural or spiritual beliefs that might influence medical decisions  Exploration of goals of care in the event of a sudden injury or illness  Identification of a healthcare agent - Son Review of an  advance directive document -  CODE STATUS: DNR  Symptom Management/Plan:  Dyspnea: Currently on room air, 83% at rest. Needs mobile oxygen so to be able to go to appointments.  Endorses fatigue at 100 feet. Has concentrator. Needs canisters and rO2 echarger for use in home and on porch as well. Not weighing at home, encouraged to do so.   Caregiver: Has paid caregiver 4 days weekly. This is helping with bathing and feeding. I have  asked her to weigh daily too.  Mobility: Able to ambulate with walker, has home PT. Needs safety and fall prevention management. Has high risk of falls.  DM:  Has a freestyle libre. Cannot use it. I am making home health RN referral to assist with home glucose management.  Needs audio feature enabled. No other meter in home which works, has one from pre 2018, no supplies.   Diet: Eating moderate salt, discussed and she says she "is watching it. " Denies needing any teaching as she does not add any salt. Endorses liking biscuits and pimento cheese crackers.  Low vision: Needs low vision assessment, [facility]  for the blind. I will ask our SW to make a referral.  Wounds L LE and  UE, has area which may be infected, will Rx with docycycline and mupriocin. Bordered gauze to L LE with mupriocin ointment daily, may reuse dressing.  L FA has similar lesion, Rx with telfa and mupirocin daily, gave some supplies and instructed son to purchase more.  [place] RN to follow  Follow up Palliative Care Visit: Palliative care will continue to follow for complex medical decision making, advance care planning, and clarification of goals. Return 2  weeks or prn.  I spent 60 minutes providing this consultation. More than 50% of the time in this consultation was spent in counseling and care coordination.  PPS: 40%  HOSPICE ELIGIBILITY/DIAGNOSIS: TBD  Chief Complaint: debility, DM, fall risk, wounds, CHF  HISTORY OF PRESENT ILLNESS:  Judy Harvey is a 88 y.o. year old female  with frequent falls, CHF, DM, wounds . Recent hospital and snf stay for CHF. Patient seen today to review palliative care needs to include medical decision making and advance care planning as appropriate.   History obtained from review of EMR, discussion with primary team, and interview with family, facility staff/caregiver and/or Judy Harvey.  I reviewed available labs, medications, imaging, studies and related documents from the EMR.   Records reviewed and summarized above.   ROS   General: NAD EYES: denies vision changes, blind ENMT: denies dysphagia Cardiovascular: denies chest pain, endorses DOE Pulmonary: denies cough, endorses increased SOB Abdomen: endorses good appetite, denies constipation, endorses continence of bowel GU: denies dysuria, endorses continence of urine MSK:  endorses   increased weakness,  no recent  falls reported Skin: denies rashes, endorses 2 wounds L FA and LE wounds Neurological: denies pain, denies insomnia Psych: Endorses positive mood  Physical Exam: Current and past weights:none recent, requested her to weigh daily. Has not been  Constitutional: NAD General: frail appearing, WNWD EYES: anicteric sclera, lids intact, no discharge  ENMT:  hard of hearing, oral mucous membranes moist, dentition intact CV: S1S2, RRR, no LE edema Pulmonary: LCTA, diminished, + increased work of breathing, no cough, oxygen 2-3 L Abdomen: intake 100%,  soft and non tender, no ascites MSK: +sarcopenia, moves all extremities, ambulatory with walker  Skin: warm and dry, 2 wounds on visible skin as above, round with LE with some periphery that is boggy and tender Neuro:  +generalized weakness,  no cognitive impairment, slight anxious affect  Thank you for the opportunity to participate in the care of Judy Harvey.  The palliative care team will continue to follow. Please call our office at [phone] if we can be of additional assistance.   Jason Coop, NP DNP, AGPCNP-BC  COVID-19 PATIENT SCREENING TOOL Asked and negative response unless otherwise noted:   Have you had symptoms of covid, tested positive or been in contact with someone with symptoms/positive test in the past 5-10 days?

## 2021-10-05 ENCOUNTER — Telehealth: Payer: Self-pay

## 2021-10-05 ENCOUNTER — Other Ambulatory Visit: Payer: Medicare Other | Admitting: Primary Care

## 2021-10-05 NOTE — Telephone Encounter (Addendum)
(  2:20 PM) PC SW attempted to outreached patient to discuss possible resource for blind and low vision.   Call unsuccessful. SW unable to LVM due to mailbox being full.  (2:22 PM) PC SW outreached patient son, Judy Harvey,   Call unsuccessful. SW unable to LVM due to mailbox being full.     SW will mail the following resources to patient home address address on file along with SW contact information.   Services for the blind  How to Apply  To apply for services contact a Child psychotherapist for the Blind or an Audiological scientist at your local [employer].  Independent Living Services for the Blind  We offer comprehensive, quality services to assist you in adjusting to your vision loss and learning to live independently and safely in your home and community.  Services include:  Adjustment to vision loss counseling and Solicitor  Assistance with residing in the most independent setting possible  Designer, multimedia  Daily living skills classes provided in local communities by our [employer]  Extensive daily living skills instruction  Individual and family adjustment services  Information and referral to other resources  Low vision services  Orientation and mobility (safe travel) training   [facility] [place] Served: [name], [name], Scientist, physiological, [name], [name], [name], [name], [name], [name], [name], [name], [name] Phone: [phone] Toll Free: [phone] Fax: [phone] Email: [email or web address] Physical Address [address] [place], [place] [address]

## 2021-10-14 ENCOUNTER — Other Ambulatory Visit: Payer: Medicare Other | Admitting: Primary Care

## 2021-10-14 ENCOUNTER — Telehealth: Payer: Self-pay

## 2021-10-14 DIAGNOSIS — R531 Weakness: Secondary | ICD-10-CM

## 2021-10-14 DIAGNOSIS — N1831 Chronic kidney disease, stage 3a: Secondary | ICD-10-CM

## 2021-10-14 DIAGNOSIS — Z515 Encounter for palliative care: Secondary | ICD-10-CM

## 2021-10-14 DIAGNOSIS — I509 Heart failure, unspecified: Secondary | ICD-10-CM

## 2021-10-14 DIAGNOSIS — E1122 Type 2 diabetes mellitus with diabetic chronic kidney disease: Secondary | ICD-10-CM

## 2021-10-14 DIAGNOSIS — I5031 Acute diastolic (congestive) heart failure: Secondary | ICD-10-CM

## 2021-10-14 NOTE — Progress Notes (Signed)
[facility] Palliative Care Consult Note Telephone: [phone]  Fax: [phone]    Date of encounter: 10/14/21 11:12 AM PATIENT NAME: DOREEN GARRETSON [address] [place] [address]   [phone] (home)  DOB: 1927-04-27 MRN: [id number] PRIMARY CARE PROVIDER:    Leonel Ramsay, MD,  [place] [address] [phone]  REFERRING PROVIDER:   Leonel Ramsay, MD [place],  [place] [address] [phone]  RESPONSIBLE PARTY:    Contact Information     Name Relation Home Work [place] Son [phone]  [phone]       I met face to face with patient and family in  home. Palliative Care was asked to follow this patient by consultation request of  Leonel Ramsay, MD to address advance care planning and complex medical decision making. This is a follow up visit.                                   ASSESSMENT AND PLAN / RECOMMENDATIONS:   Advance Care Planning/Goals of Care: Goals include to maximize quality of life and symptom management. Patient/health care surrogate gave his/her permission to discuss.Our advance care planning conversation included a discussion about:    Exploration of personal, cultural or spiritual beliefs that might influence medical decisions  Exploration of goals of care in the event of a sudden injury or illness  Identification of a healthcare agent - son [name] CODE STATUS: DNR  Symptom Management/Plan:  Wound: Resolved on Arm and L leg. It did appear to have been MRSA.  Area on leg is no longer tender to touch, and no longer open.  Dysnpea: Has started using oxygen ATC with good results, drops into low 80's% without oxygen.  On oxygen it is 98%. I have called oxygen company, [employer], who will see if they can come out and do the home evaluation for portable concentrator. She is out of oxygen  back up tanks, and I've asked for servicing in home for  oxygen concentrator, and refill mobile tanks. Son to follow up with call to oxygen company.  Mobility: Has PT now from [facility], using walker, No falls reported.We discussed need to avoid tripping risks, and risk of oxygen tubing.  Glucose: Has not started [employer]. No nursing has come from Home health. [facility] stated they can bring back the device and he will teach them. [name] states he will. I asked them to take a smart phone to upload the app. She states she has not monitored her glucose in a while, and we discussed how labile glucose levels could be contributing to her falls.  Follow up Palliative Care Visit: Palliative care will continue to follow for complex medical decision making, advance care planning, and clarification of goals. Return 6 weeks or prn.  I spent 60 minutes providing this consultation. More than 50% of the time in this consultation was spent in counseling and care coordination.  PPS: 50%  HOSPICE ELIGIBILITY/DIAGNOSIS: no  Chief Complaint: glucose control, fall risk.  HISTORY OF PRESENT ILLNESS:  LATRICA CLOWERS is a 88 y.o. year old female  with DM, immobility, dyspnea.  Patient seen today to review palliative care needs to include medical decision making and advance care planning as appropriate.   History obtained from review of EMR, discussion with primary team, and interview with family, facility staff/caregiver  and/or Ms. Bircher.  I reviewed available labs, medications, imaging, studies and related documents from the EMR.  Records reviewed and summarized above.   ROS General: NAD EYES: denies vision changes, endorses blindness ENMT: endorses dysphagia Cardiovascular: denies chest pain, endorses DOE Pulmonary: denies cough, denies increased SOB Abdomen: endorses good appetite, denies constipation, endorses continence of bowel GU: denies dysuria, endorses continence of urine MSK:  denies  increased weakness,  no recent falls  reported Skin: denies rashes , endorses healing  wounds Neurological: denies pain, denies insomnia Psych: Endorses positive mood  Physical Exam: Current and past weights: unavailable Constitutional: NAD General: frail appearing, WNWD EYES: anicteric sclera, lids intact, no discharge  ENMT: hard  of hearing, oral mucous membranes moist, dentition missing CV: S1S2, RRR, + murmur, no LE edema Pulmonary: LCTA, no increased work of breathing, no cough, supplemental oxygen, PO2=98% Abdomen: intake 100%, normo-active BS + 4 quadrants, soft and non tender, no ascites MSK: moderate  sarcopenia, moves all extremities, ambulatory with walker  Skin: warm and dry, no rashes, healing wounds on visible skin Neuro:  + generalized weakness,  no cognitive impairment, non-anxious affect   Thank you for the opportunity to participate in the care of Ms. Schloss.  The palliative care team will continue to follow. Please call our office at [phone] if we can be of additional assistance.   Jason Coop, NP DNP, AGPCNP-BC  COVID-19 PATIENT SCREENING TOOL Asked and negative response unless otherwise noted:   Have you had symptoms of covid, tested positive or been in contact with someone with symptoms/positive test in the past 5-10 days?

## 2021-10-14 NOTE — Telephone Encounter (Signed)
Phone call made to [facility].  Spoke with [place] who advised they do not offer a formal class on freestyle libre but if patient brings the device in they can assist with set up and teaching.  Update provided to [name], NP.

## 2021-10-14 NOTE — Telephone Encounter (Signed)
845 am.  Spoke with [facility] provider regarding concentrator.  Agency has attempted to reach patient 3 x to schedule a home evaluation for portable concentrator. VM is full and they have been unable to leave a message.  Update provided to [name], NP with call back number for family to schedule evaluation with [facility].

## 2021-11-09 ENCOUNTER — Ambulatory Visit: Payer: Medicare Other | Attending: Family | Admitting: Family

## 2021-11-09 ENCOUNTER — Ambulatory Visit: Payer: Medicare Other | Admitting: Family

## 2021-11-09 ENCOUNTER — Encounter: Payer: Self-pay | Admitting: Family

## 2021-11-09 VITALS — BP 147/61 | HR 63 | Resp 16 | Ht 61.0 in | Wt 152.0 lb

## 2021-11-09 DIAGNOSIS — E785 Hyperlipidemia, unspecified: Secondary | ICD-10-CM | POA: Diagnosis not present

## 2021-11-09 DIAGNOSIS — E119 Type 2 diabetes mellitus without complications: Secondary | ICD-10-CM | POA: Diagnosis not present

## 2021-11-09 DIAGNOSIS — Z9049 Acquired absence of other specified parts of digestive tract: Secondary | ICD-10-CM | POA: Diagnosis not present

## 2021-11-09 DIAGNOSIS — I639 Cerebral infarction, unspecified: Secondary | ICD-10-CM | POA: Diagnosis not present

## 2021-11-09 DIAGNOSIS — H548 Legal blindness, as defined in USA: Secondary | ICD-10-CM | POA: Insufficient documentation

## 2021-11-09 DIAGNOSIS — I5032 Chronic diastolic (congestive) heart failure: Secondary | ICD-10-CM

## 2021-11-09 DIAGNOSIS — E1142 Type 2 diabetes mellitus with diabetic polyneuropathy: Secondary | ICD-10-CM | POA: Diagnosis not present

## 2021-11-09 DIAGNOSIS — Z8673 Personal history of transient ischemic attack (TIA), and cerebral infarction without residual deficits: Secondary | ICD-10-CM | POA: Insufficient documentation

## 2021-11-09 DIAGNOSIS — I10 Essential (primary) hypertension: Secondary | ICD-10-CM | POA: Diagnosis not present

## 2021-11-09 DIAGNOSIS — I110 Hypertensive heart disease with heart failure: Secondary | ICD-10-CM | POA: Diagnosis present

## 2021-11-09 DIAGNOSIS — R0602 Shortness of breath: Secondary | ICD-10-CM | POA: Insufficient documentation

## 2021-11-09 DIAGNOSIS — F32A Depression, unspecified: Secondary | ICD-10-CM | POA: Diagnosis not present

## 2021-11-09 DIAGNOSIS — K219 Gastro-esophageal reflux disease without esophagitis: Secondary | ICD-10-CM | POA: Diagnosis not present

## 2021-11-09 DIAGNOSIS — R55 Syncope and collapse: Secondary | ICD-10-CM | POA: Insufficient documentation

## 2021-11-09 DIAGNOSIS — Z515 Encounter for palliative care: Secondary | ICD-10-CM | POA: Insufficient documentation

## 2021-11-09 DIAGNOSIS — Z794 Long term (current) use of insulin: Secondary | ICD-10-CM | POA: Diagnosis not present

## 2021-11-09 DIAGNOSIS — Z7984 Long term (current) use of oral hypoglycemic drugs: Secondary | ICD-10-CM | POA: Insufficient documentation

## 2021-11-09 DIAGNOSIS — Z833 Family history of diabetes mellitus: Secondary | ICD-10-CM | POA: Diagnosis not present

## 2021-11-09 DIAGNOSIS — D649 Anemia, unspecified: Secondary | ICD-10-CM | POA: Diagnosis not present

## 2021-11-09 NOTE — Patient Instructions (Addendum)
Continue weighing daily and call for an overnight weight gain of 3 pounds or more or a weekly weight gain of more than 5 pounds.   If you have voicemail, please make sure your mailbox is cleaned out so that we may leave a message and please make sure to listen to any voicemails.    Call us if you need [place] for anything or to make another appointment.    Call Dr. Sampson Goon regarding your insulin

## 2021-11-09 NOTE — Progress Notes (Signed)
Patient ID: Judy Harvey, female    DOB: 1927/09/25, 88 y.o.   MRN: [id number]  HPI  Judy Harvey is a 88 y/o female with a history of DM, hyperlipidemia, HTN, stroke, anemia, depression, GERD, legally blind, macular degeneration, stroke, previous tobacco use and chronic heart failure.   Echo report from 05/19/21 reviewed and showed an EF of 55-60%  Admitted 09/09/21 due to syncope as well as recent worsening dyspnea, lower extremity edema as well as 3 pillow orthopnea and paroxysmal nocturnal dyspnea. Noncontrasted head CT scan revealed no acute intracranial normalities. C-spine CT showed no acute abnormalities. Given IV lasix and nebulizers. Transitioned to oral diuretics. Given IV iron & 1 unit of pRBCs while for IDA. PT/ OT evaluations done. Discharged after 5 days.   She presents today for a follow-up visit with a chief complaint of moderate shortness of breath with minimal exertion. Describes this as chronic in nature. She has associated fatigue, blurry vision and light-headedness along with this. She denies any difficulty sleeping, abdominal distention, palpitations, pedal edema, chest pain, cough or weight gain.   Now has libre to check her glucose. Taking 44 units of insulin and is asking for sliding scale guidance. Taking metformin and metoprolol daily.   Past Medical History:  Diagnosis Date   Anemia    CHF (congestive heart failure) (HCC)    Depression    Diabetes mellitus without complication (HCC)    GERD (gastroesophageal reflux disease)    History of COVID-19    Hyperlipidemia    Hypertension    Legally blind    Macular degeneration, bilateral    Peripheral neuropathy    Post herpetic neuralgia    located in her back   Stroke [facility])    Past Surgical History:  Procedure Laterality Date   ABDOMINAL HYSTERECTOMY     partial   APPENDECTOMY     BREAST SURGERY     reduction   CAROTID PTA/STENT INTERVENTION Left 10/18/2017   Procedure: CAROTID PTA/STENT INTERVENTION;   Surgeon: Annice Needy, MD;  Location: [facility] INVASIVE CV LAB;  Service: Cardiovascular;  Laterality: Left;   CATARACT EXTRACTION, BILATERAL     CESAREAN SECTION     x3   COLONOSCOPY     ESOPHAGEAL DILATION     Family History  Problem Relation Age of Onset   Healthy Mother    Diabetes Father    Alzheimer's disease Father    Multiple sclerosis Sister    Multiple sclerosis Brother    Social History   Tobacco Use   Smoking status: Former    Packs/day: 3.00    Years: 50.00    Total pack years: 150.00    Types: Cigarettes    Quit date: 2008    Years since quitting: 15.6   Smokeless tobacco: Never   Tobacco comments:    smoking cessation materials not required  Substance Use Topics   Alcohol use: No   Allergies  Allergen Reactions   Adhesive [Tape] Other (See Comments)    "pulls my skin off" paper tape ok   Lyrica [Pregabalin]     Body spasms   Prior to Admission medications   Medication Sig Start Date End Date Taking? Authorizing Provider  acetaminophen (TYLENOL) 325 MG tablet Take 650 mg by mouth every 6 (six) hours as needed for mild pain or fever.   Yes [provider]  aspirin EC 81 MG tablet Take 81 mg by mouth daily.   Yes [provider]  atorvastatin (LIPITOR)  80 MG tablet Take 1 tablet (80 mg total) by mouth at bedtime. 05/22/21  Yes Hongalgi, Lenis Dickinson, MD  Cholecalciferol (VITAMIN D3) 5000 units CAPS Take 1 capsule (5,000 Units total) by mouth daily. 06/28/16  Yes Plonk, Gwyndolyn Saxon, MD  docusate sodium (COLACE) 100 MG capsule Take 100 mg by mouth daily.   Yes [provider]  gabapentin (NEURONTIN) 300 MG capsule Take 300 mg by mouth daily.   Yes [provider]  insulin glargine (LANTUS) 100 UNIT/ML injection Inject 0.1 mLs (10 Units total) into the skin at bedtime. 05/25/21  Yes Lorella Nimrod, MD  Melatonin 3 MG CAPS Take 3 mg by mouth at bedtime.   Yes [provider]  metFORMIN (GLUCOPHAGE-XR) 500 MG 24 hr tablet Take 500 mg  by mouth 2 (two) times daily. Has been taking once a day, only in the AM 03/23/21  Yes [provider]  metoprolol tartrate (LOPRESSOR) 25 MG tablet Take 0.5 tablets (12.5 mg total) by mouth 2 (two) times daily. Patient taking differently: Take 12.5 mg by mouth 2 (two) times daily. Has been taking one tablet once daily 05/22/21  Yes Hongalgi, Lenis Dickinson, MD  ondansetron (ZOFRAN) 8 MG tablet Take 8 mg by mouth every 8 (eight) hours as needed for nausea or vomiting.    Yes [provider]  traZODone (DESYREL) 50 MG tablet Take 50 mg by mouth at bedtime.   Yes [provider]  triamcinolone ointment (KENALOG) 0.1 % Apply 1 Application topically 2 (two) times daily. 08/31/21  Yes [provider]  albuterol (ACCUNEB) 1.25 MG/3ML nebulizer solution Take 3 mLs by nebulization every 6 (six) hours as needed for wheezing or shortness of breath. Patient not taking: Reported on 11/09/2021 11/12/19   [provider]  albuterol (VENTOLIN HFA) 108 (90 Base) MCG/ACT inhaler Inhale 2 puffs into the lungs every 6 (six) hours as needed for shortness of breath or wheezing. Patient not taking: Reported on 11/09/2021 11/06/19   [provider]  furosemide (LASIX) 20 MG tablet Take 1 tablet (20 mg total) by mouth daily. Patient not taking: Reported on 11/09/2021 09/14/21   Wyvonnia Dusky, MD    Review of Systems  Constitutional:  Positive for fatigue. Negative for appetite change.  HENT:  Positive for hearing loss. Negative for congestion, postnasal drip and sore throat.   Eyes:  Positive for visual disturbance (due to macular degeneration).  Respiratory:  Positive for shortness of breath (easily). Negative for cough and chest tightness.   Cardiovascular:  Negative for chest pain, palpitations and leg swelling.  Gastrointestinal:  Negative for abdominal distention and abdominal pain.  Endocrine: Negative.   Genitourinary: Negative.   Musculoskeletal:  Negative for back  pain and neck pain.  Skin: Negative.   Allergic/Immunologic: Negative.   Neurological:  Positive for light-headedness (at times). Negative for dizziness.  Hematological:  Negative for adenopathy. Does not bruise/bleed easily.  Psychiatric/Behavioral:  Negative for dysphoric mood and sleep disturbance (sleeping on 2-3 pillows). The patient is not nervous/anxious.    Vitals:   11/09/21 1144  BP: (!) 147/61  Pulse: 63  Resp: 16  SpO2: 100%  Weight: 152 lb (68.9 kg)  Height: 5\' 1"  (1.549 m)   Wt Readings from Last 3 Encounters:  11/09/21 152 lb (68.9 kg)  09/30/21 152 lb 8 oz (69.2 kg)  09/14/21 160 lb (72.6 kg)   Lab Results  Component Value Date   CREATININE 0.79 09/14/2021   CREATININE 1.00 09/13/2021   CREATININE 0.96  09/12/2021   Physical Exam Vitals and nursing note reviewed. Exam conducted with a chaperone present (son).  Constitutional:      Appearance: Normal appearance.  HENT:     Head: Normocephalic and atraumatic.     Right Ear: Decreased hearing noted.     Left Ear: Decreased hearing noted.  Cardiovascular:     Rate and Rhythm: Normal rate and regular rhythm.  Pulmonary:     Effort: Pulmonary effort is normal. No respiratory distress.     Breath sounds: No wheezing or rales.  Abdominal:     General: There is no distension.     Palpations: Abdomen is soft.  Musculoskeletal:        General: No tenderness.     Cervical back: Normal range of motion and neck supple.     Right lower leg: No edema.     Left lower leg: No edema.  Skin:    General: Skin is warm and dry.  Neurological:     General: No focal deficit present.     Mental Status: She is alert and oriented to person, place, and time.  Psychiatric:        Mood and Affect: Mood normal.        Behavior: Behavior normal.        Thought Content: Thought content normal.   Assessment & Plan:  1: Chronic heart failure with preserved ejection fraction without structural changes- - NYHA class III -  euvolemic today - weighing daily; reminded to call for an overnight weight gain of > 2 pounds or a weekly weight gain of > 5 pounds - weight unchanged from last visit here 5 weeks ago - reviewed the importance of not adding salt and that she could use Mrs Sharilyn Sites seasoning; reviewed the importance of reading food labels to keep daily sodium intake to 2000mg  - currently not taking a diuretic but does have it to take if needed - BNP 09/09/21 was 955.4  2: HTN- - BP mildly elevated (147/61) but it was low at last visit (95/53) - saw PCP Sampson Goon) 10/19/21  - BMP 10/19/21 reviewed and showed sodium 141, potassium 4.0, creatinine 1.1 and GFR 46  3: DM- - A1c 09/10/21 was 6.6% - taking metformin and using lantus insulin - emphasized that she get sliding scale instructions from her PCP  4: Stroke- - palliative care visit done 10/14/21  5: Anemia- - saw hematology Cathie Hoops) 08/30/21 - Hg 10/19/21 was 10.9   Patient did not bring her medications nor a list. Each medication was verbally reviewed with the patient and she was encouraged to bring the bottles to every visit to confirm accuracy of list.   Due to HF stability, will not make a return appointment at this time. Advised patient and son that they could call back at anytime to make another appointment and they were comfortable with this plan.

## 2021-11-23 ENCOUNTER — Other Ambulatory Visit: Payer: Medicare Other | Admitting: Primary Care

## 2021-11-23 DIAGNOSIS — R531 Weakness: Secondary | ICD-10-CM

## 2021-11-23 DIAGNOSIS — I509 Heart failure, unspecified: Secondary | ICD-10-CM

## 2021-11-23 DIAGNOSIS — Z515 Encounter for palliative care: Secondary | ICD-10-CM

## 2021-11-23 DIAGNOSIS — E1142 Type 2 diabetes mellitus with diabetic polyneuropathy: Secondary | ICD-10-CM

## 2021-11-23 NOTE — Progress Notes (Addendum)
[facility] Pall[facility] Consult Note Telephone: [phone]  Fax: [phone]    Date of encounter: 11/23/21 11:00 AM PATIENT NAME: Judy Harvey [address] [address]   [phone] (home)  DOB: 1928-01-13 MRN: [id number] PRIMARY CARE PROVIDER:    Leonel Ramsay, MD,  [place] [address] [phone]  REFERRING PROVIDER:   Leonel Ramsay, MD [place],  [place] [address] [phone]  RESPONSIBLE PARTY:    Contact Information     Name Relation Home Work [place] Son [phone]  [phone]        I met face to face with patient and family in  home. Palliative Care was asked to follow this patient by consultation request of  Leonel Ramsay, MD to address advance care planning and complex medical decision making. This is a follow up visit.                                   ASSESSMENT AND PLAN / RECOMMENDATIONS:   Advance Care Planning/Goals of Care: Goals include to maximize quality of life and symptom management. Patient/health care surrogate gave his/her permission to discuss.Our advance care planning conversation included a discussion about:    The value and importance of advance care planning  Experiences with loved ones who have been seriously ill or have died  Exploration of personal, cultural or spiritual beliefs that might influence medical decisions  Exploration of goals of care in the event of a sudden injury or illness  Identification of a healthcare agent  Review  of an  advance directive document . CODE STATUS: DNR  Symptom Management/Plan:  Insomnia: Endorses continued and on 75 mg q hs. I have suggested increasing to 100 mg . She gets up at hs and then cannot get back to sleep. Endorses cutting off fluids at dinner time.  Glucose control: Does not know her latest but has the Elenor Legato now in place and son is using. He is not here  today for our visit. Readings around 250's several weeks ago. Not changed device in 1 week, education RE needing to change disc regularly.   Nausea: Reports x two days but is keeping fluids down. Has ondansetron. Instructed to be sure sugar does not get too low, use sugar drinks while she's not eating. Avoid fats, call PCP if not better in 2 days. Appears well hydrated. Not vomiting.   COPD: has mobile concentrator now, obtained from insurance.  Has not used it yet but has for going out.   Follow up Palliative Care Visit: Palliative care will continue to follow for complex medical decision making, advance care planning, and clarification of goals. Return 6-8 weeks or prn.  This visit was coded based on medical decision making (MDM).  PPS: 40%  HOSPICE ELIGIBILITY/DIAGNOSIS: no  Chief Complaint: nausea,debility  HISTORY OF PRESENT ILLNESS:  Judy Harvey is a 88 y.o. year old female  with DM, COPD, CHF, Debility . Patient seen today to review palliative care needs to include medical decision making and advance care planning as appropriate.   History obtained from review of EMR, discussion with primary team, and interview with family, facility staff/caregiver and/or Ms. Kaylor.  I reviewed available labs, medications, imaging, studies and related documents from the EMR.  Records reviewed and summarized above.   ROS  General: NAD ENMT: denies dysphagia,  endorses nausea Cardiovascular: denies chest pain, denies DOE Pulmonary: denies cough, denies increased SOB Abdomen: Endorses nausea  but no vomiting, and some weakness, endorses fair  appetite, denies constipation, endorses continence of bowel GU: denies dysuria, endorses continence of urine MSK:  endorses   increased weakness,  no falls reported Skin: denies rashes or wounds Neurological: denies pain, endorses insomnia Psych: Endorses flat mood  Physical Exam: Current and past weights: stable  Constitutional: NAD, not ill  appearing, appears fatigued General: frail appearing, wnwd EYES: anicteric sclera, lids intact, no discharge  ENMT: hard of  hearing, oral mucous membranes moist, dentition intact CV: RRR, no LE edema Pulmonary: no increased work of breathing, no cough, supplemental oxygen Abdomen: intake 40%, normo-active BS + 4 quadrants, soft and non tender, no ascites, bm WNL MSK: + sarcopenia, moves all extremities, ambulatory with walker  Skin: warm and dry, no rashes or wounds on visible skin Neuro:  +generalized weakness,  no cognitive impairment,slight anxious affect   Thank you for the opportunity to participate in the care of Ms. Ancona.  The palliative care team will continue to follow. Please call our office at [phone] if we can be of additional assistance.   Jason Coop, NP DNP, AGPCNP-BC  COVID-19 PATIENT SCREENING TOOL Asked and negative response unless otherwise noted:   Have you had symptoms of covid, tested positive or been in contact with someone with symptoms/positive test in the past 5-10 days?

## 2021-11-30 ENCOUNTER — Inpatient Hospital Stay: Payer: Medicare Other | Attending: Oncology

## 2021-11-30 DIAGNOSIS — Z79899 Other long term (current) drug therapy: Secondary | ICD-10-CM | POA: Insufficient documentation

## 2021-11-30 DIAGNOSIS — D509 Iron deficiency anemia, unspecified: Secondary | ICD-10-CM | POA: Insufficient documentation

## 2021-11-30 LAB — CBC WITH DIFFERENTIAL/PLATELET
Abs Immature Granulocytes: 0.03 10*3/uL (ref 0.00–0.07)
Basophils Absolute: 0 10*3/uL (ref 0.0–0.1)
Basophils Relative: 1 %
Eosinophils Absolute: 0.4 10*3/uL (ref 0.0–0.5)
Eosinophils Relative: 5 %
HCT: 31.2 % — ABNORMAL LOW (ref 36.0–46.0)
Hemoglobin: 9.5 g/dL — ABNORMAL LOW (ref 12.0–15.0)
Immature Granulocytes: 0 %
Lymphocytes Relative: 10 %
Lymphs Abs: 0.7 10*3/uL (ref 0.7–4.0)
MCH: 29.7 pg (ref 26.0–34.0)
MCHC: 30.4 g/dL (ref 30.0–36.0)
MCV: 97.5 fL (ref 80.0–100.0)
Monocytes Absolute: 0.4 10*3/uL (ref 0.1–1.0)
Monocytes Relative: 5 %
Neutro Abs: 6.1 10*3/uL (ref 1.7–7.7)
Neutrophils Relative %: 79 %
Platelets: 201 10*3/uL (ref 150–400)
RBC: 3.2 MIL/uL — ABNORMAL LOW (ref 3.87–5.11)
RDW: 17.3 % — ABNORMAL HIGH (ref 11.5–15.5)
WBC: 7.7 10*3/uL (ref 4.0–10.5)
nRBC: 0 % (ref 0.0–0.2)

## 2021-11-30 LAB — FERRITIN: Ferritin: 27 ng/mL (ref 11–307)

## 2021-11-30 LAB — IRON AND TIBC
Iron: 49 ug/dL (ref 28–170)
Saturation Ratios: 14 % (ref 10.4–31.8)
TIBC: 344 ug/dL (ref 250–450)
UIBC: 295 ug/dL

## 2021-11-30 MED FILL — Iron Sucrose Inj 20 MG/ML (Fe Equiv): INTRAVENOUS | Qty: 10 | Status: AC

## 2021-12-01 ENCOUNTER — Inpatient Hospital Stay: Payer: Medicare Other

## 2021-12-01 ENCOUNTER — Inpatient Hospital Stay (HOSPITAL_BASED_OUTPATIENT_CLINIC_OR_DEPARTMENT_OTHER): Payer: Medicare Other | Admitting: Oncology

## 2021-12-01 ENCOUNTER — Encounter: Payer: Self-pay | Admitting: Oncology

## 2021-12-01 VITALS — BP 100/79 | HR 69 | Temp 97.5°F | Ht 61.0 in | Wt 151.5 lb

## 2021-12-01 DIAGNOSIS — D508 Other iron deficiency anemias: Secondary | ICD-10-CM | POA: Diagnosis not present

## 2021-12-01 DIAGNOSIS — D509 Iron deficiency anemia, unspecified: Secondary | ICD-10-CM | POA: Diagnosis not present

## 2021-12-01 NOTE — Progress Notes (Signed)
Hematology/Oncology Progress note [phone] [phone]      Patient Care Team: Mick SellFitzgerald, David P, MD as PCP - General (Infectious Diseases) Rickard PatienceYu, Quandarius Nill, MD as Consulting Physician (Hematology and Oncology) Mick SellFitzgerald, David P, MD as Referring Physician (Infectious Diseases)   CHIEF COMPLAINTS/REASON FOR VISIT:  Follow up for iron deficiency anemia  HISTORY OF PRESENTING ILLNESS:  Judy Harvey is a  88 y.o.  female with PMH listed below was seen in consultation at the request of Mick SellFitzgerald, David P, MD for follow up iron deficiency anemia.   Reviewed patient's recent labs patient was hospitalized from 03/12/2021 - 03/19/2021, due to nausea vomiting, AKI, acute urinary retention, symptomatic anemia and acute hypoxic respiratory failure. 03/19/2021 Labs revealed anemia with hemoglobin of 7.5, MCV 85.8. Patient received 1 unit of PRBC during her most recent hospitalization  Patient has acute on chronic renal insufficiency, creatinine was increased to 3.7 which responded to IV fluid and decreased to 1.28 at discharge.  This was felt to be secondary to poor oral intake. 03/12/2021, saturation 4, ferritin 6, TIBC 361, iron 14. Reviewed patient's previous labs ordered by primary care physician's office, anemia is chronic onset , duration is since at 2019. Patient is a poor historian.  She is blind.  She does not know if she has any blood in the stool.  She was accompanied by her son Loraine LericheMark.  Per son, patient does not have good appetite.  Patient reports that she has taken oral iron supplementation Lately. She has an appointment with urology this week.  # hospitalization 08/11/2021 - 08/13/2019 Hemoglobin 6.1, hematocrit 21.6, symptomatic anemia, got blood transfusion.  08/18/2021 patient had a CBC done at primary care doctor's office with hemoglobin 9.0, MCV 87.3.  INTERVAL HISTORY Judy Harvey is a 88 y.o. female who has above history reviewed by me today presents for  follow up visit for management of iron deficiency anemia.  She tolerated IV Venofer treatments.  09/09/21-09/14/21 Hospitalization due to CHF exacerbation. Patient was accompanied by care giver.  Patient reports not feeling good. +nausea   Review of Systems  Constitutional:  Positive for appetite change and fatigue. Negative for chills and fever.  HENT:   Negative for hearing loss and voice change.   Eyes:  Negative for eye problems.  Respiratory:  Positive for shortness of breath. Negative for chest tightness and cough.   Cardiovascular:  Negative for chest pain.  Gastrointestinal:  Positive for nausea. Negative for abdominal distention and abdominal pain.  Endocrine: Negative for hot flashes.  Genitourinary:  Negative for difficulty urinating and frequency.   Musculoskeletal:  Negative for arthralgias.  Skin:  Negative for itching and rash.  Neurological:  Negative for extremity weakness.  Hematological:  Negative for adenopathy.  Psychiatric/Behavioral:  Negative for confusion.     MEDICAL HISTORY:  Past Medical History:  Diagnosis Date   Anemia    CHF (congestive heart failure) (HCC)    Depression    Diabetes mellitus without complication (HCC)    GERD (gastroesophageal reflux disease)    History of COVID-19    Hyperlipidemia    Hypertension    Legally blind    Macular degeneration, bilateral    Peripheral neuropathy    Post herpetic neuralgia    located in her back   Stroke [facility](HCC)     SURGICAL HISTORY: Past Surgical History:  Procedure Laterality Date   ABDOMINAL HYSTERECTOMY     partial   APPENDECTOMY     BREAST SURGERY  reduction   CAROTID PTA/STENT INTERVENTION Left 10/18/2017   Procedure: CAROTID PTA/STENT INTERVENTION;  Surgeon: Annice Needy, MD;  Location: [facility] INVASIVE CV LAB;  Service: Cardiovascular;  Laterality: Left;   CATARACT EXTRACTION, BILATERAL     CESAREAN SECTION     x3   COLONOSCOPY     ESOPHAGEAL DILATION      SOCIAL HISTORY: Social  History   Socioeconomic History   Marital status: Widowed    Spouse name: Not on file   Number of children: 3   Years of education: some college   Highest education level: Not on file  Occupational History   Occupation: Disabled  Tobacco Use   Smoking status: Former    Packs/day: 3.00    Years: 50.00    Total pack years: 150.00    Types: Cigarettes    Quit date: 2008    Years since quitting: 15.7   Smokeless tobacco: Never   Tobacco comments:    smoking cessation materials not required  Vaping Use   Vaping Use: Never used  Substance and Sexual Activity   Alcohol use: No   Drug use: No   Sexual activity: Not Currently  Other Topics Concern   Not on file  Social History Narrative   Pt lives with son Loraine Leriche   Social Determinants of Health   Financial Resource Strain: Low Risk  (03/29/2017)   Overall Financial Resource Strain (CARDIA)    Difficulty of Paying Living Expenses: Not hard at all  Food Insecurity: No Food Insecurity (03/29/2017)   Hunger Vital Sign    Worried About Running Out of Food in the Last Year: Never true    Ran Out of Food in the Last Year: Never true  Transportation Needs: Unmet Transportation Needs (03/29/2017)   PRAPARE - Transportation    Lack of Transportation (Medical): Yes    Lack of Transportation (Non-Medical): Yes  Physical Activity: Inactive (03/29/2017)   Exercise Vital Sign    Days of Exercise per Week: 0 days    Minutes of Exercise per Session: 0 min  Stress: No Stress Concern Present (03/29/2017)   [employer] of Occupational Health - Occupational Stress Questionnaire    Feeling of Stress : Not at all  Social Connections: Unknown (04/16/2018)   Social Connection and Isolation Panel [NHANES]    Frequency of Communication with Friends and Family: More than three times a week    Frequency of Social Gatherings with Friends and Family: Three times a week    Attends Religious Services: Never    Active Member of Clubs or Organizations: No     Attends Banker Meetings: Never    Marital Status: Not on file  Intimate Partner Violence: Unknown (03/29/2017)   Humiliation, Afraid, Rape, and Kick questionnaire    Fear of Current or Ex-Partner: Patient refused    Emotionally Abused: Patient refused    Physically Abused: Patient refused    Sexually Abused: Patient refused    FAMILY HISTORY: Family History  Problem Relation Age of Onset   Healthy Mother    Diabetes Father    Alzheimer's disease Father    Multiple sclerosis Sister    Multiple sclerosis Brother     ALLERGIES:  is allergic to adhesive [tape] and lyrica [pregabalin].  MEDICATIONS:  Current Outpatient Medications  Medication Sig Dispense Refill   acetaminophen (TYLENOL) 325 MG tablet Take 650 mg by mouth every 6 (six) hours as needed for mild pain or fever.     aspirin EC  81 MG tablet Take 81 mg by mouth daily.     atorvastatin (LIPITOR) 80 MG tablet Take 1 tablet (80 mg total) by mouth at bedtime. 30 tablet 1   Cholecalciferol (VITAMIN D3) 5000 units CAPS Take 1 capsule (5,000 Units total) by mouth daily. 30 capsule    docusate sodium (COLACE) 100 MG capsule Take 100 mg by mouth daily.     gabapentin (NEURONTIN) 300 MG capsule Take 300 mg by mouth daily.     insulin glargine (LANTUS) 100 UNIT/ML injection Inject 0.1 mLs (10 Units total) into the skin at bedtime. 10 mL 11   Melatonin 3 MG CAPS Take 3 mg by mouth at bedtime.     metFORMIN (GLUCOPHAGE-XR) 500 MG 24 hr tablet Take 500 mg by mouth 2 (two) times daily. Has been taking once a day, only in the AM     metoprolol tartrate (LOPRESSOR) 25 MG tablet Take 0.5 tablets (12.5 mg total) by mouth 2 (two) times daily. (Patient taking differently: Take 12.5 mg by mouth 2 (two) times daily. Has been taking one tablet once daily) 60 tablet 0   ondansetron (ZOFRAN) 8 MG tablet Take 8 mg by mouth every 8 (eight) hours as needed for nausea or vomiting.      traZODone (DESYREL) 50 MG tablet Take 75 mg by mouth  at bedtime.     triamcinolone ointment (KENALOG) 0.1 % Apply 1 Application topically 2 (two) times daily.     albuterol (ACCUNEB) 1.25 MG/3ML nebulizer solution Take 3 mLs by nebulization every 6 (six) hours as needed for wheezing or shortness of breath. (Patient not taking: Reported on 11/09/2021)     albuterol (VENTOLIN HFA) 108 (90 Base) MCG/ACT inhaler Inhale 2 puffs into the lungs every 6 (six) hours as needed for shortness of breath or wheezing. (Patient not taking: Reported on 11/09/2021)     furosemide (LASIX) 20 MG tablet Take 1 tablet (20 mg total) by mouth daily. (Patient not taking: Reported on 11/09/2021) 30 tablet 0   No current facility-administered medications for this visit.     PHYSICAL EXAMINATION: Vitals:   12/01/21 1411 12/01/21 1446  BP: (!) 143/125 100/79  Pulse: 69   Temp: (!) 97.5 F (36.4 C)   SpO2: 94%    Filed Weights   12/01/21 1411  Weight: 151 lb 8 oz (68.7 kg)    Physical Exam Constitutional:      General: She is not in acute distress.    Comments: Frail appearance elderly female, she sits in a wheelchair.  HENT:     Head: Normocephalic and atraumatic.  Eyes:     General: No scleral icterus. Cardiovascular:     Rate and Rhythm: Normal rate and regular rhythm.     Heart sounds: Normal heart sounds.  Pulmonary:     Effort: Pulmonary effort is normal. No respiratory distress.     Comments: On nasal cannula oxygen Abdominal:     Palpations: Abdomen is soft.  Musculoskeletal:        General: No deformity. Normal range of motion.     Cervical back: Normal range of motion and neck supple.  Skin:    General: Skin is warm.     Coloration: Skin is pale.  Neurological:     Mental Status: She is alert. Mental status is at baseline.     Cranial Nerves: No cranial nerve deficit.  Psychiatric:        Mood and Affect: Mood normal.     LABORATORY DATA:  I have reviewed the data as listed    Latest Ref Rng & Units 11/30/2021   10:48 AM 09/14/2021     5:57 AM 09/13/2021    5:12 AM  CBC  WBC 4.0 - 10.5 K/uL 7.7  8.1  8.3   Hemoglobin 12.0 - 15.0 g/dL 9.5  8.9  8.6   Hematocrit 36.0 - 46.0 % 31.2  31.5  30.1   Platelets 150 - 400 K/uL 201  181  208       Latest Ref Rng & Units 09/14/2021    5:57 AM 09/13/2021    5:12 AM 09/12/2021    6:09 AM  CMP  Glucose 70 - 99 mg/dL 157  103  89   BUN 8 - 23 mg/dL 20  19  22    Creatinine 0.44 - 1.00 mg/dL 0.79  1.00  0.96   Sodium 135 - 145 mmol/L 141  145  142   Potassium 3.5 - 5.1 mmol/L 3.7  3.9  3.6   Chloride 98 - 111 mmol/L 95  98  99   CO2 22 - 32 mmol/L 41  39  38   Calcium 8.9 - 10.3 mg/dL 8.1  8.1  7.8    Lab Results  Component Value Date   IRON 49 11/30/2021   TIBC 344 11/30/2021   FERRITIN 27 11/30/2021       RADIOGRAPHIC STUDIES: I have personally reviewed the radiological images as listed and agreed with the findings in the report. No results found.     ASSESSMENT & PLAN:  1. Other iron deficiency anemia    Iron deficiency anemia Labs are reviewed and discussed with patient. Hemoglobin has improved  Recommend additional IV venofer x3  to further improve iron store.   Follow-up in 3 months with repeat blood work and assessment of need of additional IV Venofer treatments. Orders Placed This Encounter  Procedures   CBC with Differential/Platelet    Standing Status:   Future    Standing Expiration Date:   8/41/3244   Basic metabolic panel    Standing Status:   Future    Standing Expiration Date:   12/02/2022   Urinalysis, Complete w Microscopic    Standing Status:   Future    Standing Expiration Date:   12/02/2022   Iron and TIBC    Standing Status:   Future    Standing Expiration Date:   12/02/2022   Ferritin    Standing Status:   Future    Standing Expiration Date:   12/02/2022    All questions were answered. The patient knows to call the clinic with any problems questions or concerns.  Cc Leonel Ramsay, MD  Earlie Server, MD, PhD 12/01/2021

## 2021-12-01 NOTE — Progress Notes (Signed)
Pt states she feels bad, fatigue, likes to sleep a lot.

## 2021-12-06 ENCOUNTER — Inpatient Hospital Stay: Payer: Medicare Other

## 2021-12-10 MED FILL — Iron Sucrose Inj 20 MG/ML (Fe Equiv): INTRAVENOUS | Qty: 10 | Status: AC

## 2021-12-13 ENCOUNTER — Inpatient Hospital Stay: Payer: Medicare Other | Attending: Oncology

## 2021-12-13 VITALS — BP 142/59 | HR 68 | Temp 96.7°F | Resp 18

## 2021-12-13 DIAGNOSIS — Z79899 Other long term (current) drug therapy: Secondary | ICD-10-CM | POA: Insufficient documentation

## 2021-12-13 DIAGNOSIS — D509 Iron deficiency anemia, unspecified: Secondary | ICD-10-CM | POA: Diagnosis present

## 2021-12-13 MED ORDER — SODIUM CHLORIDE 0.9 % IV SOLN
Freq: Once | INTRAVENOUS | Status: AC
  Filled 2021-12-13: qty 250

## 2021-12-13 MED ORDER — SODIUM CHLORIDE 0.9 % IV SOLN
200.0000 mg | Freq: Once | INTRAVENOUS | Status: AC
  Administered 2021-12-13: 200 mg via INTRAVENOUS
  Filled 2021-12-13: qty 10

## 2021-12-20 ENCOUNTER — Inpatient Hospital Stay: Payer: Medicare Other

## 2021-12-20 VITALS — BP 139/67 | HR 64 | Temp 97.0°F | Resp 18

## 2021-12-20 DIAGNOSIS — D509 Iron deficiency anemia, unspecified: Secondary | ICD-10-CM | POA: Diagnosis not present

## 2021-12-20 MED ORDER — SODIUM CHLORIDE 0.9 % IV SOLN
Freq: Once | INTRAVENOUS | Status: AC
  Filled 2021-12-20: qty 250

## 2021-12-20 MED ORDER — SODIUM CHLORIDE 0.9 % IV SOLN
200.0000 mg | Freq: Once | INTRAVENOUS | Status: AC
  Administered 2021-12-20: 200 mg via INTRAVENOUS
  Filled 2021-12-20: qty 200

## 2021-12-24 MED FILL — Iron Sucrose Inj 20 MG/ML (Fe Equiv): INTRAVENOUS | Qty: 10 | Status: AC

## 2021-12-27 ENCOUNTER — Inpatient Hospital Stay: Payer: Medicare Other

## 2021-12-29 ENCOUNTER — Inpatient Hospital Stay: Payer: Medicare Other

## 2021-12-29 VITALS — BP 133/58 | Temp 96.6°F | Resp 18

## 2021-12-29 DIAGNOSIS — D509 Iron deficiency anemia, unspecified: Secondary | ICD-10-CM

## 2021-12-29 MED ORDER — SODIUM CHLORIDE 0.9 % IV SOLN
Freq: Once | INTRAVENOUS | Status: AC
  Filled 2021-12-29: qty 250

## 2021-12-29 MED ORDER — SODIUM CHLORIDE 0.9 % IV SOLN
200.0000 mg | Freq: Once | INTRAVENOUS | Status: AC
  Administered 2021-12-29: 200 mg via INTRAVENOUS
  Filled 2021-12-29: qty 200

## 2021-12-29 NOTE — Patient Instructions (Signed)
[facility]  Discharge Instructions: Thank you for choosing [facility] to provide your oncology and hematology care.  If you have a lab appointment with the Cancer Center, please go directly to the Cancer Center and check in at the registration area.  Wear comfortable clothing and clothing appropriate for easy access to any Portacath or PICC line.   We strive to give you quality time with your provider. You may need to reschedule your appointment if you arrive late (15 or more minutes).  Arriving late affects you and other patients whose appointments are after yours.  Also, if you miss three or more appointments without notifying the office, you may be dismissed from the clinic at the provider's discretion.      For prescription refill requests, have your pharmacy contact our office and allow 72 hours for refills to be completed.    Today you received the following chemotherapy and/or immunotherapy agents Venofer.      To help prevent nausea and vomiting after your treatment, we encourage you to take your nausea medication as directed.  BELOW ARE SYMPTOMS THAT SHOULD BE REPORTED IMMEDIATELY: *FEVER GREATER THAN 100.4 F (38 C) OR HIGHER *CHILLS OR SWEATING *NAUSEA AND VOMITING THAT IS NOT CONTROLLED WITH YOUR NAUSEA MEDICATION *UNUSUAL SHORTNESS OF BREATH *UNUSUAL BRUISING OR BLEEDING *URINARY PROBLEMS (pain or burning when urinating, or frequent urination) *BOWEL PROBLEMS (unusual diarrhea, constipation, pain near the anus) TENDERNESS IN MOUTH AND THROAT WITH OR WITHOUT PRESENCE OF ULCERS (sore throat, sores in mouth, or a toothache) UNUSUAL RASH, SWELLING OR PAIN  UNUSUAL VAGINAL DISCHARGE OR ITCHING   Items with * indicate a potential emergency and should be followed up as soon as possible or go to the Emergency Department if any problems should occur.  Please show the CHEMOTHERAPY ALERT CARD or IMMUNOTHERAPY ALERT CARD at check-in to  the Emergency Department and triage nurse.  Should you have questions after your visit or need to cancel or reschedule your appointment, please contact [facility]  [phone] and follow the prompts.  Office hours are 8:00 a.m. to 4:30 p.m. Monday - Friday. Please note that voicemails left after 4:00 p.m. may not be returned until the following business day.  We are closed weekends and major holidays. You have access to a nurse at all times for urgent questions. Please call the main number to the clinic [phone] and follow the prompts.  For any non-urgent questions, you may also contact your provider using MyChart. We now offer e-Visits for anyone 18 and older to request care online for non-urgent symptoms. For details visit [email or web address].   Also download the MyChart app! Go to the app store, search "MyChart", open the app, select [facility], and log in with your MyChart username and password.  Masks are optional in the cancer centers. If you would like for your care team to wear a mask while they are taking care of you, please let them know. For doctor visits, patients may have with them one support person who is at least 86 years old. At this time, visitors are not allowed in the infusion area.   

## 2022-01-03 ENCOUNTER — Other Ambulatory Visit: Payer: Medicare Other | Admitting: Primary Care

## 2022-01-03 DIAGNOSIS — Z515 Encounter for palliative care: Secondary | ICD-10-CM

## 2022-01-03 DIAGNOSIS — R531 Weakness: Secondary | ICD-10-CM

## 2022-01-03 DIAGNOSIS — I509 Heart failure, unspecified: Secondary | ICD-10-CM

## 2022-01-03 DIAGNOSIS — E1142 Type 2 diabetes mellitus with diabetic polyneuropathy: Secondary | ICD-10-CM

## 2022-01-03 NOTE — Progress Notes (Signed)
[facility] Consult Note Telephone: [phone]  Fax: [phone]    Date of encounter: 01/03/22 4:29 PM PATIENT NAME: Judy Harvey [address] [place] [address]   [phone] (home)  DOB: 1928/01/27 MRN: [id number] PRIMARY CARE PROVIDER:    Leonel Ramsay, MD,  [place] [address] [phone]  REFERRING PROVIDER:   Leonel Ramsay, MD [place],  [place] [address] [phone]  RESPONSIBLE PARTY:    Contact Information     Name Relation Home Work [place] Son [phone]  [phone]        I met face to face with patient and family in  home. Palliative Care was asked to follow this patient by consultation request of  Leonel Ramsay, MD to address advance care planning and complex medical decision making. This is a follow up visit.                                   ASSESSMENT AND PLAN / RECOMMENDATIONS:   Advance Care Planning/Goals of Care: Goals include to maximize quality of life and symptom management. Patient/health care surrogate gave his/her permission to discuss. Our advance care planning conversation included a discussion about:     Exploration of personal, cultural or spiritual beliefs that might influence medical decisions  Exploration of goals of care in the event of a sudden injury or illness  Identification of a healthcare agent   CODE STATUS: dnr  Symptom Management/Plan:  I met with patient in her home. Her son and care worker were both present. She endorses her  baseline, but son states  she's somewhat weaker. She still able to ambulate to the bathroom and is continent. She is also reporting that she eats well. Her glucose's are running roughly around the 200 to 300 per her meter report. She has a Sports coach which they are using for glucose assessment, roughly several days a week. She denies falls and states she  wants to be very careful due to her previous injuries.   We discussed her getting a carrying tray for her walker. She is very compliant with her walker and ambulate safely. She does have significant low vision, which does pose a safety risk.  Her memory is at baseline. She is able to recount an event at their farm this past weekend. We discussed our change in home nurse practitioner visiting services. I will ask RN and social work team to continue to visit due to her advanced age and debility. Currently she has no DME or home health needs, and  is at her cognitive and functional baseline.    Follow up Palliative Care Visit: Palliative care will continue to follow for complex medical decision making, advance care planning, and clarification of goals. NP program to end, will be followed by RN/SW Return 6-8 weeks or prn.  I spent 40 minutes providing this consultation. More than 50% of the time in this consultation was spent in counseling and care coordination.  PPS: 40%  HOSPICE ELIGIBILITY/DIAGNOSIS: TBD  Chief Complaint: debility, fall risk  HISTORY OF PRESENT ILLNESS:  Judy Harvey is a 88 y.o. year old female  with Debility, fall risk DM, CHF .   History obtained from review of EMR, discussion with primary team, and interview with family, facility staff/caregiver and/or Ms. Bennick.  I reviewed available labs, medications,  imaging, studies and related documents from the EMR.  Records reviewed and summarized above.   ROS   General: NAD EYES: denies vision changes, very low vision ENMT: denies dysphagia Cardiovascular: denies chest pain, denies DOE Pulmonary: denies cough, denies increased SOB Abdomen: endorses good appetite, denies constipation, endorses continence of bowel GU: denies dysuria, endorses continence of urine MSK:  denies increased weakness,  no falls reported Skin: denies rashes or wounds Neurological: denies pain, denies insomnia Psych: Endorses positive  mood Heme/lymph/immuno: denies bruises, abnormal bleeding  Physical Exam: Current and past weights: stable  Constitutional: NAD General: frail appearing EYES: anicteric sclera, lids intact, no discharge  ENMT: intact hearing, oral mucous membranes moist, dentition intact CV: no LE edema Pulmonary: no increased work of breathing, no cough, room air Abdomen: intake 80%,  soft and non tender, no ascites GU: deferred MSK: +sarcopenia, moves all extremities, ambulatory with walker  Skin: warm and dry, no rashes or wounds on visible skin Neuro:  no new generalized weakness,  mild  cognitive impairment Psych: non-anxious affect, A and O x 2-3 Hem/lymph/immuno: no widespread bruising   Thank you for the opportunity to participate in the care of Judy Harvey.  The palliative care team will continue to follow. Please call our office at [phone] if we can be of additional assistance.   Jason Coop, NP   COVID-19 PATIENT SCREENING TOOL Asked and negative response unless otherwise noted:   Have you had symptoms of covid, tested positive or been in contact with someone with symptoms/positive test in the past 5-10 days?

## 2022-01-10 ENCOUNTER — Other Ambulatory Visit: Payer: Medicare Other | Admitting: Primary Care

## 2022-02-28 ENCOUNTER — Inpatient Hospital Stay: Payer: Medicare Other | Attending: Oncology

## 2022-02-28 DIAGNOSIS — D509 Iron deficiency anemia, unspecified: Secondary | ICD-10-CM | POA: Insufficient documentation

## 2022-02-28 DIAGNOSIS — D508 Other iron deficiency anemias: Secondary | ICD-10-CM

## 2022-02-28 DIAGNOSIS — Z87891 Personal history of nicotine dependence: Secondary | ICD-10-CM | POA: Diagnosis not present

## 2022-02-28 LAB — CBC WITH DIFFERENTIAL/PLATELET
Abs Immature Granulocytes: 0.05 10*3/uL (ref 0.00–0.07)
Basophils Absolute: 0.1 10*3/uL (ref 0.0–0.1)
Basophils Relative: 1 %
Eosinophils Absolute: 0.5 10*3/uL (ref 0.0–0.5)
Eosinophils Relative: 5 %
HCT: 37.2 % (ref 36.0–46.0)
Hemoglobin: 11.8 g/dL — ABNORMAL LOW (ref 12.0–15.0)
Immature Granulocytes: 1 %
Lymphocytes Relative: 11 %
Lymphs Abs: 1 10*3/uL (ref 0.7–4.0)
MCH: 30.3 pg (ref 26.0–34.0)
MCHC: 31.7 g/dL (ref 30.0–36.0)
MCV: 95.6 fL (ref 80.0–100.0)
Monocytes Absolute: 0.6 10*3/uL (ref 0.1–1.0)
Monocytes Relative: 6 %
Neutro Abs: 7.2 10*3/uL (ref 1.7–7.7)
Neutrophils Relative %: 76 %
Platelets: 214 10*3/uL (ref 150–400)
RBC: 3.89 MIL/uL (ref 3.87–5.11)
RDW: 13.1 % (ref 11.5–15.5)
WBC: 9.5 10*3/uL (ref 4.0–10.5)
nRBC: 0 % (ref 0.0–0.2)

## 2022-02-28 LAB — IRON AND TIBC
Iron: 72 ug/dL (ref 28–170)
Saturation Ratios: 22 % (ref 10.4–31.8)
TIBC: 325 ug/dL (ref 250–450)
UIBC: 253 ug/dL

## 2022-02-28 LAB — FERRITIN: Ferritin: 44 ng/mL (ref 11–307)

## 2022-03-02 ENCOUNTER — Inpatient Hospital Stay (HOSPITAL_BASED_OUTPATIENT_CLINIC_OR_DEPARTMENT_OTHER): Payer: Medicare Other | Admitting: Oncology

## 2022-03-02 ENCOUNTER — Encounter: Payer: Self-pay | Admitting: Oncology

## 2022-03-02 ENCOUNTER — Other Ambulatory Visit: Payer: Medicare Other

## 2022-03-02 ENCOUNTER — Inpatient Hospital Stay: Payer: Medicare Other

## 2022-03-02 VITALS — BP 163/60 | HR 58 | Resp 16

## 2022-03-02 VITALS — BP 128/95 | HR 68 | Temp 97.2°F | Resp 18 | Wt 152.8 lb

## 2022-03-02 DIAGNOSIS — D509 Iron deficiency anemia, unspecified: Secondary | ICD-10-CM

## 2022-03-02 MED ORDER — SODIUM CHLORIDE 0.9 % IV SOLN
200.0000 mg | Freq: Once | INTRAVENOUS | Status: AC
  Administered 2022-03-02: 200 mg via INTRAVENOUS
  Filled 2022-03-02: qty 200

## 2022-03-02 MED ORDER — SODIUM CHLORIDE 0.9 % IV SOLN
Freq: Once | INTRAVENOUS | Status: AC
  Filled 2022-03-02: qty 250

## 2022-03-02 NOTE — Patient Instructions (Signed)

## 2022-03-02 NOTE — Progress Notes (Signed)
Pt here for follow up. No new concerns voiced.   

## 2022-03-04 ENCOUNTER — Encounter: Payer: Self-pay | Admitting: Oncology

## 2022-03-04 NOTE — Assessment & Plan Note (Addendum)
Labs are reviewed and discussed with patient. Hemoglobin has improved  Recommend additional IV venofer x1  to further improve iron store.  She declined GI work up

## 2022-03-04 NOTE — Progress Notes (Signed)
Hematology/Oncology Progress note [phone] [phone]      Patient Care Team: Mick Sell, MD as PCP - General (Infectious Diseases) Rickard Patience, MD as Consulting Physician (Hematology and Oncology) Mick Sell, MD as Referring Physician (Infectious Diseases)   CHIEF COMPLAINTS/REASON FOR VISIT:  Follow up for iron deficiency anemia  ASSESSMENT & PLAN:   Iron deficiency anemia Labs are reviewed and discussed with patient. Hemoglobin has improved  Recommend additional IV venofer x1  to further improve iron store.  She is not interested in GI work up  Orders Placed This Encounter  Procedures   CBC with Differential/Platelet    Standing Status:   Future    Standing Expiration Date:   03/02/2023   Iron and TIBC([facility])    Standing Status:   Future    Standing Expiration Date:   03/03/2023   Ferritin    Standing Status:   Future    Standing Expiration Date:   03/03/2023   Follow up in 4 months.  All questions were answered. The patient knows to call the clinic with any problems, questions or concerns.  Rickard Patience, MD, PhD [facility] Hematology Oncology 03/02/2022   HISTORY OF PRESENTING ILLNESS:  Judy Harvey is a  88 y.o.  female with PMH listed below was seen in consultation at the request of Mick Sell, MD for follow up iron deficiency anemia.   Reviewed patient's recent labs patient was hospitalized from 03/12/2021 - 03/19/2021, due to nausea vomiting, AKI, acute urinary retention, symptomatic anemia and acute hypoxic respiratory failure. 03/19/2021 Labs revealed anemia with hemoglobin of 7.5, MCV 85.8. Patient received 1 unit of PRBC during her most recent hospitalization  Patient has acute on chronic renal insufficiency, creatinine was increased to 3.7 which responded to IV fluid and decreased to 1.28 at discharge.  This was felt to be secondary to poor oral intake. 03/12/2021, saturation 4, ferritin 6, TIBC  361, iron 14. Reviewed patient's previous labs ordered by primary care physician's office, anemia is chronic onset , duration is since at 2019. Patient is a poor historian.  She is blind.  She does not know if she has any blood in the stool.  She was accompanied by her son Judy Harvey.  Per son, patient does not have good appetite.  Patient reports that she has taken oral iron supplementation Lately. She has an appointment with urology this week.  # hospitalization 08/11/2021 - 08/13/2019 Hemoglobin 6.1, hematocrit 21.6, symptomatic anemia, got blood transfusion.  08/18/2021 patient had a CBC done at primary care doctor's office with hemoglobin 9.0, MCV 87.3. 09/09/21-09/14/21 Hospitalization due to CHF exacerbation.  INTERVAL HISTORY Judy Harvey is a 88 y.o. female who has above history reviewed by me today presents for follow up visit for management of iron deficiency anemia.  She tolerated IV Venofer treatments.  Patient was accompanied by care giver.  Patient reports feeling ok.    Review of Systems  Constitutional:  Positive for appetite change and fatigue. Negative for chills and fever.  HENT:   Negative for hearing loss and voice change.   Eyes:  Negative for eye problems.  Respiratory:  Negative for chest tightness, cough and shortness of breath.   Cardiovascular:  Negative for chest pain.  Gastrointestinal:  Negative for abdominal distention, abdominal pain and nausea.  Endocrine: Negative for hot flashes.  Genitourinary:  Negative for difficulty urinating and frequency.   Musculoskeletal:  Negative for arthralgias.  Skin:  Negative for itching and rash.  Neurological:  Negative for extremity weakness.  Hematological:  Negative for adenopathy.  Psychiatric/Behavioral:  Negative for confusion.     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   Stroke [facility](HCC)     SURGICAL HISTORY: Past Surgical History:  Procedure Laterality Date   ABDOMINAL HYSTERECTOMY     partial   APPENDECTOMY     BREAST SURGERY     reduction   CAROTID PTA/STENT INTERVENTION Left 10/18/2017   Procedure: CAROTID PTA/STENT INTERVENTION;  Surgeon: Annice Needyew, Jason S, MD;  Location: [facility] INVASIVE CV LAB;  Service: Cardiovascular;  Laterality: Left;   CATARACT EXTRACTION, BILATERAL     CESAREAN SECTION     x3   COLONOSCOPY     ESOPHAGEAL DILATION      SOCIAL HISTORY: Social History   Socioeconomic History   Marital status: Widowed    Spouse name: Not on file   Number of children: 3   Years of education: some college   Highest education level: Not on file  Occupational History   Occupation: Disabled  Tobacco Use   Smoking status: Former    Packs/day: 3.00    Years: 50.00    Total pack years: 150.00    Types: Cigarettes    Quit date: 2008    Years since quitting: 15.9   Smokeless tobacco: Never   Tobacco comments:    smoking cessation materials not required  Vaping Use   Vaping Use: Never used  Substance and Sexual Activity   Alcohol use: No   Drug use: No   Sexual activity: Not Currently  Other Topics Concern   Not on file  Social History Narrative   Pt lives with son Judy LericheMark   Social Determinants of Health   Financial Resource Strain: Low Risk  (03/29/2017)   Overall Financial Resource Strain (CARDIA)    Difficulty of Paying Living Expenses: Not hard at all  Food Insecurity: No Food Insecurity (03/29/2017)   Hunger Vital Sign    Worried About Running Out of Food in the Last Year: Never true    Ran Out of Food in the Last Year: Never true  Transportation Needs: Unmet Transportation Needs (03/29/2017)   PRAPARE -  Transportation    Lack of Transportation (Medical): Yes    Lack of Transportation (Non-Medical): Yes  Physical Activity: Inactive (03/29/2017)   Exercise Vital Sign    Days of Exercise per Week: 0 days    Minutes of Exercise per Session: 0 min  Stress: No Stress Concern Present (03/29/2017)   [employer] - Occupational Stress Questionnaire    Feeling of Stress : Not at all  Social Connections: Unknown (04/16/2018)   Social Connection and Isolation Panel [NHANES]    Frequency of Communication with Friends and Family: More than three times a week    Frequency of Social Gatherings with Friends and Family: Three times a week    Attends Religious Services: Never    Active Member of Clubs or Organizations: No    Attends BankerClub or Organization Meetings: Never    Marital  Status: Not on file  Intimate Partner Violence: Unknown (03/29/2017)   Humiliation, Afraid, Rape, and Kick questionnaire    Fear of Current or Ex-Partner: Patient refused    Emotionally Abused: Patient refused    Physically Abused: Patient refused    Sexually Abused: Patient refused    FAMILY HISTORY: Family History  Problem Relation Age of Onset   Healthy Mother    Diabetes Father    Alzheimer's disease Father    Multiple sclerosis Sister    Multiple sclerosis Brother     ALLERGIES:  is allergic to adhesive [tape] and lyrica [pregabalin].  MEDICATIONS:  Current Outpatient Medications  Medication Sig Dispense Refill   acetaminophen (TYLENOL) 325 MG tablet Take 650 mg by mouth every 6 (six) hours as needed for mild pain or fever.     albuterol (ACCUNEB) 1.25 MG/3ML nebulizer solution Take 3 mLs by nebulization every 6 (six) hours as needed for wheezing or shortness of breath.     albuterol (VENTOLIN HFA) 108 (90 Base) MCG/ACT inhaler Inhale 2 puffs into the lungs every 6 (six) hours as needed for shortness of breath or wheezing.     aspirin EC 81 MG tablet Take 81 mg by mouth daily.      atorvastatin (LIPITOR) 80 MG tablet Take 1 tablet (80 mg total) by mouth at bedtime. 30 tablet 1   Cholecalciferol (VITAMIN D3) 5000 units CAPS Take 1 capsule (5,000 Units total) by mouth daily. 30 capsule    docusate sodium (COLACE) 100 MG capsule Take 100 mg by mouth daily.     gabapentin (NEURONTIN) 300 MG capsule Take 300 mg by mouth daily.     insulin glargine (LANTUS) 100 UNIT/ML injection Inject 0.1 mLs (10 Units total) into the skin at bedtime. 10 mL 11   Melatonin 3 MG CAPS Take 3 mg by mouth at bedtime.     metFORMIN (GLUCOPHAGE-XR) 500 MG 24 hr tablet Take 500 mg by mouth 2 (two) times daily. Has been taking once a day, only in the AM     metoprolol tartrate (LOPRESSOR) 25 MG tablet Take 0.5 tablets (12.5 mg total) by mouth 2 (two) times daily. (Patient taking differently: Take 12.5 mg by mouth 2 (two) times daily. Has been taking one tablet once daily) 60 tablet 0   ondansetron (ZOFRAN) 8 MG tablet Take 8 mg by mouth every 8 (eight) hours as needed for nausea or vomiting.      traZODone (DESYREL) 50 MG tablet Take 75 mg by mouth at bedtime.     triamcinolone ointment (KENALOG) 0.1 % Apply 1 Application topically 2 (two) times daily.     furosemide (LASIX) 20 MG tablet Take 1 tablet (20 mg total) by mouth daily. (Patient not taking: Reported on 03/02/2022) 30 tablet 0   No current facility-administered medications for this visit.     PHYSICAL EXAMINATION: Vitals:   03/02/22 1447  BP: (!) 128/95  Pulse: 68  Resp: 18  Temp: (!) 97.2 F (36.2 C)   Filed Weights   03/02/22 1447  Weight: 152 lb 12.8 oz (69.3 kg)    Physical Exam Constitutional:      General: She is not in acute distress.    Comments: Frail appearance elderly female, she sits in a wheelchair.  HENT:     Head: Normocephalic and atraumatic.  Eyes:     General: No scleral icterus. Cardiovascular:     Rate and Rhythm: Normal rate and regular rhythm.     Heart sounds: Normal heart  sounds.  Pulmonary:      Effort: Pulmonary effort is normal. No respiratory distress.     Comments: On nasal cannula oxygen Abdominal:     Palpations: Abdomen is soft.  Musculoskeletal:        General: No deformity. Normal range of motion.     Cervical back: Normal range of motion and neck supple.  Skin:    General: Skin is warm.     Coloration: Skin is pale.  Neurological:     Mental Status: She is alert. Mental status is at baseline.     Cranial Nerves: No cranial nerve deficit.  Psychiatric:        Mood and Affect: Mood normal.     LABORATORY DATA:  I have reviewed the data as listed    Latest Ref Rng & Units 02/28/2022    2:49 PM 11/30/2021   10:48 AM 09/14/2021    5:57 AM  CBC  WBC 4.0 - 10.5 K/uL 9.5  7.7  8.1   Hemoglobin 12.0 - 15.0 g/dL 80.8  9.5  8.9   Hematocrit 36.0 - 46.0 % 37.2  31.2  31.5   Platelets 150 - 400 K/uL 214  201  181       Latest Ref Rng & Units 09/14/2021    5:57 AM 09/13/2021    5:12 AM 09/12/2021    6:09 AM  CMP  Glucose 70 - 99 mg/dL 811  031  89   BUN 8 - 23 mg/dL 20  19  22    Creatinine 0.44 - 1.00 mg/dL  5.94  5.85   Sodium 135 - 145 mmol/L 141  145  142   Potassium 3.5 - 5.1 mmol/L 3.7  3.9  3.6   Chloride 98 - 111 mmol/L 95  98  99   CO2 22 - 32 mmol/L 41  39  38   Calcium 8.9 - 10.3 mg/dL 8.1  8.1  7.8    Lab Results  Component Value Date   IRON 72 02/28/2022   TIBC 325 02/28/2022   FERRITIN 44 02/28/2022       RADIOGRAPHIC STUDIES: I have personally reviewed the radiological images as listed and agreed with the findings in the report. No results found.

## 2022-06-01 ENCOUNTER — Encounter: Payer: Self-pay | Admitting: Oncology

## 2022-06-24 ENCOUNTER — Encounter: Payer: Self-pay | Admitting: Oncology

## 2022-07-01 ENCOUNTER — Inpatient Hospital Stay: Payer: Medicare Other | Attending: Oncology

## 2022-07-01 DIAGNOSIS — Z87891 Personal history of nicotine dependence: Secondary | ICD-10-CM | POA: Diagnosis not present

## 2022-07-01 DIAGNOSIS — R5383 Other fatigue: Secondary | ICD-10-CM | POA: Insufficient documentation

## 2022-07-01 DIAGNOSIS — D509 Iron deficiency anemia, unspecified: Secondary | ICD-10-CM | POA: Insufficient documentation

## 2022-07-01 DIAGNOSIS — S81801A Unspecified open wound, right lower leg, initial encounter: Secondary | ICD-10-CM | POA: Diagnosis not present

## 2022-07-01 LAB — CBC WITH DIFFERENTIAL/PLATELET
Abs Immature Granulocytes: 0.04 10*3/uL (ref 0.00–0.07)
Basophils Absolute: 0.1 10*3/uL (ref 0.0–0.1)
Basophils Relative: 1 %
Eosinophils Absolute: 0.4 10*3/uL (ref 0.0–0.5)
Eosinophils Relative: 4 %
HCT: 36.6 % (ref 36.0–46.0)
Hemoglobin: 11.6 g/dL — ABNORMAL LOW (ref 12.0–15.0)
Immature Granulocytes: 0 %
Lymphocytes Relative: 10 %
Lymphs Abs: 1 10*3/uL (ref 0.7–4.0)
MCH: 30.5 pg (ref 26.0–34.0)
MCHC: 31.7 g/dL (ref 30.0–36.0)
MCV: 96.3 fL (ref 80.0–100.0)
Monocytes Absolute: 0.6 10*3/uL (ref 0.1–1.0)
Monocytes Relative: 6 %
Neutro Abs: 7.6 10*3/uL (ref 1.7–7.7)
Neutrophils Relative %: 79 %
Platelets: 224 10*3/uL (ref 150–400)
RBC: 3.8 MIL/uL — ABNORMAL LOW (ref 3.87–5.11)
RDW: 13.8 % (ref 11.5–15.5)
WBC: 9.6 10*3/uL (ref 4.0–10.5)
nRBC: 0 % (ref 0.0–0.2)

## 2022-07-01 LAB — IRON AND TIBC
Iron: 61 ug/dL (ref 28–170)
Saturation Ratios: 20 % (ref 10.4–31.8)
TIBC: 305 ug/dL (ref 250–450)
UIBC: 244 ug/dL

## 2022-07-01 LAB — FERRITIN: Ferritin: 46 ng/mL (ref 11–307)

## 2022-07-04 ENCOUNTER — Inpatient Hospital Stay: Payer: Medicare Other

## 2022-07-04 ENCOUNTER — Inpatient Hospital Stay (HOSPITAL_BASED_OUTPATIENT_CLINIC_OR_DEPARTMENT_OTHER): Payer: Medicare Other | Admitting: Oncology

## 2022-07-04 ENCOUNTER — Encounter: Payer: Self-pay | Admitting: Oncology

## 2022-07-04 VITALS — BP 103/77 | HR 73 | Temp 97.1°F | Wt 158.1 lb

## 2022-07-04 DIAGNOSIS — S81801A Unspecified open wound, right lower leg, initial encounter: Secondary | ICD-10-CM

## 2022-07-04 DIAGNOSIS — D509 Iron deficiency anemia, unspecified: Secondary | ICD-10-CM | POA: Diagnosis not present

## 2022-07-04 DIAGNOSIS — D508 Other iron deficiency anemias: Secondary | ICD-10-CM

## 2022-07-04 NOTE — Assessment & Plan Note (Addendum)
Labs are reviewed and discussed with patient. Hemoglobin has improved  No need for additional IV Venofer treatments. She declined GI work up

## 2022-07-04 NOTE — Assessment & Plan Note (Signed)
I am not able to fully examine the wound due to tenderness and covering towel completely stuck on her wound. I do not have wound care supply in our office.  Recommend patient go to urgent care for further assessment of wound and management.

## 2022-07-04 NOTE — Progress Notes (Signed)
Hematology/Oncology Progress note [phone] [phone]      Patient Care Team: Mick Sell, MD as PCP - General (Infectious Diseases) Rickard Patience, MD as Consulting Physician (Hematology and Oncology) Mick Sell, MD as Referring Physician (Infectious Diseases)   CHIEF COMPLAINTS/REASON FOR VISIT:  Follow up for iron deficiency anemia  ASSESSMENT & PLAN:   Iron deficiency anemia Labs are reviewed and discussed with patient. Hemoglobin has improved  No need for additional IV Venofer treatments. She declined GI work up  Open wound of right lower leg I am not able to fully examine the wound due to tenderness and covering towel completely stuck on her wound. I do not have wound care supply in our office.  Recommend patient go to urgent care for further assessment of wound and management.  Orders Placed This Encounter  Procedures   CBC with Differential (Cancer Center Only)    Standing Status:   Future    Standing Expiration Date:   07/04/2023   Iron and TIBC    Standing Status:   Future    Standing Expiration Date:   07/04/2023   Ferritin    Standing Status:   Future    Standing Expiration Date:   07/04/2023   Iron and TIBC    Standing Status:   Future    Standing Expiration Date:   07/04/2023   Follow up in 6 months.  All questions were answered. The patient knows to call the clinic with any problems, questions or concerns.  Rickard Patience, MD, PhD [facility] Hematology Oncology 07/04/2022   HISTORY OF PRESENTING ILLNESS:  Judy Harvey is a  89 y.o.  female with PMH listed below was seen in consultation at the request of Mick Sell, MD for follow up iron deficiency anemia.   Reviewed patient's recent labs patient was hospitalized from 03/12/2021 - 03/19/2021, due to nausea vomiting, AKI, acute urinary retention, symptomatic anemia and acute hypoxic respiratory failure. 03/19/2021 Labs revealed anemia with hemoglobin of 7.5, MCV  85.8. Patient received 1 unit of PRBC during her most recent hospitalization  Patient has acute on chronic renal insufficiency, creatinine was increased to 3.7 which responded to IV fluid and decreased to 1.28 at discharge.  This was felt to be secondary to poor oral intake. 03/12/2021, saturation 4, ferritin 6, TIBC 361, iron 14. Reviewed patient's previous labs ordered by primary care physician's office, anemia is chronic onset , duration is since at 2019. Patient is a poor historian.  She is blind.  She does not know if she has any blood in the stool.  She was accompanied by her son Loraine Leriche.  Per son, patient does not have good appetite.  Patient reports that she has taken oral iron supplementation Lately. She has an appointment with urology this week.  # hospitalization 08/11/2021 - 08/13/2019 Hemoglobin 6.1, hematocrit 21.6, symptomatic anemia, got blood transfusion.  08/18/2021 patient had a CBC done at primary care doctor's office with hemoglobin 9.0, MCV 87.3. 09/09/21-09/14/21 Hospitalization due to CHF exacerbation.  INTERVAL HISTORY Judy Harvey is a 89 y.o. female who has above history reviewed by me today presents for follow up visit for management of iron deficiency anemia.  She tolerated IV Venofer treatments.  Patient was accompanied by care giver.  Patient reports feeling ok.  3 days ago, patient had a fall and had required an open wound on right lower extremity.  Denies fever or chills. She covered it with a towel   Review of  Systems  Constitutional:  Positive for fatigue. Negative for chills and fever.  HENT:   Negative for hearing loss and voice change.   Eyes:  Negative for eye problems.  Respiratory:  Negative for chest tightness, cough and shortness of breath.   Cardiovascular:  Negative for chest pain.  Gastrointestinal:  Negative for abdominal distention, abdominal pain and nausea.  Endocrine: Negative for hot flashes.  Genitourinary:  Negative for difficulty urinating  and frequency.   Musculoskeletal:  Negative for arthralgias.  Skin:  Positive for wound. Negative for itching and rash.  Neurological:  Negative for extremity weakness.  Hematological:  Negative for adenopathy.  Psychiatric/Behavioral:  Negative for confusion.     MEDICAL HISTORY:  Past Medical History:  Diagnosis Date   Anemia    CHF (congestive heart failure)    Depression    Diabetes mellitus without complication    GERD (gastroesophageal reflux disease)    History of COVID-19    Hyperlipidemia    Hypertension    Legally blind    Macular degeneration, bilateral    Peripheral neuropathy    Post herpetic neuralgia    located in her back   Stroke     SURGICAL HISTORY: Past Surgical History:  Procedure Laterality Date   ABDOMINAL HYSTERECTOMY     partial   APPENDECTOMY     BREAST SURGERY     reduction   CAROTID PTA/STENT INTERVENTION Left 10/18/2017   Procedure: CAROTID PTA/STENT INTERVENTION;  Surgeon: Annice Needy, MD;  Location: [facility] INVASIVE CV LAB;  Service: Cardiovascular;  Laterality: Left;   CATARACT EXTRACTION, BILATERAL     CESAREAN SECTION     x3   COLONOSCOPY     ESOPHAGEAL DILATION      SOCIAL HISTORY: Social History   Socioeconomic History   Marital status: Widowed    Spouse name: Not on file   Number of children: 3   Years of education: some college   Highest education level: Not on file  Occupational History   Occupation: Disabled  Tobacco Use   Smoking status: Former    Packs/day: 3.00    Years: 50.00    Additional pack years: 0.00    Total pack years: 150.00    Types: Cigarettes    Quit date: 2008    Years since quitting: 16.3   Smokeless tobacco: Never   Tobacco comments:    smoking cessation materials not required  Vaping Use   Vaping Use: Never used  Substance and Sexual Activity   Alcohol use: No   Drug use: No   Sexual activity: Not Currently  Other Topics Concern   Not on file  Social History Narrative   Pt lives with  son Loraine Leriche   Social Determinants of Health   Financial Resource Strain: Low Risk  (03/29/2017)   Overall Financial Resource Strain (CARDIA)    Difficulty of Paying Living Expenses: Not hard at all  Food Insecurity: No Food Insecurity (03/29/2017)   Hunger Vital Sign    Worried About Running Out of Food in the Last Year: Never true    Ran Out of Food in the Last Year: Never true  Transportation Needs: Unmet Transportation Needs (03/29/2017)   PRAPARE - Transportation    Lack of Transportation (Medical): Yes    Lack of Transportation (Non-Medical): Yes  Physical Activity: Inactive (03/29/2017)   Exercise Vital Sign    Days of Exercise per Week: 0 days    Minutes of Exercise per Session: 0 min  Stress:  No Stress Concern Present (03/29/2017)   [employer] of Occupational [employer] - Occupational Stress Questionnaire    Feeling of Stress : Not at all  Social Connections: Unknown (04/16/2018)   Social Connection and Isolation Panel [NHANES]    Frequency of Communication with Friends and Family: More than three times a week    Frequency of Social Gatherings with Friends and Family: Three times a week    Attends Religious Services: Never    Active Member of Clubs or Organizations: No    Attends Banker Meetings: Never    Marital Status: Not on file  Intimate Partner Violence: Unknown (03/29/2017)   Humiliation, Afraid, Rape, and Kick questionnaire    Fear of Current or Ex-Partner: Patient declined    Emotionally Abused: Patient declined    Physically Abused: Patient declined    Sexually Abused: Patient declined    FAMILY HISTORY: Family History  Problem Relation Age of Onset   Healthy Mother    Diabetes Father    Alzheimer's disease Father    Multiple sclerosis Sister    Multiple sclerosis Brother     ALLERGIES:  is allergic to adhesive [tape] and lyrica [pregabalin].  MEDICATIONS:  Current Outpatient Medications  Medication Sig Dispense Refill   acetaminophen  (TYLENOL) 325 MG tablet Take 650 mg by mouth every 6 (six) hours as needed for mild pain or fever.     albuterol (ACCUNEB) 1.25 MG/3ML nebulizer solution Take 3 mLs by nebulization every 6 (six) hours as needed for wheezing or shortness of breath.     albuterol (VENTOLIN HFA) 108 (90 Base) MCG/ACT inhaler Inhale 2 puffs into the lungs every 6 (six) hours as needed for shortness of breath or wheezing.     aspirin EC 81 MG tablet Take 81 mg by mouth daily.     atorvastatin (LIPITOR) 80 MG tablet Take 1 tablet (80 mg total) by mouth at bedtime. 30 tablet 1   Cholecalciferol (VITAMIN D3) 5000 units CAPS Take 1 capsule (5,000 Units total) by mouth daily. 30 capsule    docusate sodium (COLACE) 100 MG capsule Take 100 mg by mouth daily.     gabapentin (NEURONTIN) 300 MG capsule Take 300 mg by mouth daily.     insulin glargine (LANTUS) 100 UNIT/ML injection Inject 0.1 mLs (10 Units total) into the skin at bedtime. 10 mL 11   Melatonin 3 MG CAPS Take 3 mg by mouth at bedtime.     metFORMIN (GLUCOPHAGE-XR) 500 MG 24 hr tablet Take 500 mg by mouth 2 (two) times daily. Has been taking once a day, only in the AM     metoprolol tartrate (LOPRESSOR) 25 MG tablet Take 0.5 tablets (12.5 mg total) by mouth 2 (two) times daily. (Patient taking differently: Take 12.5 mg by mouth 2 (two) times daily. Has been taking one tablet once daily) 60 tablet 0   ondansetron (ZOFRAN) 8 MG tablet Take 8 mg by mouth every 8 (eight) hours as needed for nausea or vomiting.      traZODone (DESYREL) 50 MG tablet Take 75 mg by mouth at bedtime.     triamcinolone ointment (KENALOG) 0.1 % Apply 1 Application topically 2 (two) times daily.     furosemide (LASIX) 20 MG tablet Take 1 tablet (20 mg total) by mouth daily. (Patient not taking: Reported on 03/02/2022) 30 tablet 0   No current facility-administered medications for this visit.     PHYSICAL EXAMINATION: Vitals:   07/04/22 1452  BP: 103/77  Pulse:  73  Temp: (!) 97.1 F (36.2  C)  SpO2: 98%   Filed Weights   07/04/22 1452  Weight: 158 lb 1.6 oz (71.7 kg)    Physical Exam Constitutional:      General: She is not in acute distress.    Comments: Frail appearance elderly female, she sits in a wheelchair.  HENT:     Head: Normocephalic and atraumatic.  Eyes:     General: No scleral icterus. Cardiovascular:     Rate and Rhythm: Normal rate and regular rhythm.     Heart sounds: Normal heart sounds.  Pulmonary:     Effort: Pulmonary effort is normal. No respiratory distress.     Comments: On nasal cannula oxygen Abdominal:     Palpations: Abdomen is soft.  Musculoskeletal:        General: No deformity. Normal range of motion.     Cervical back: Normal range of motion and neck supple.  Skin:    General: Skin is warm.     Coloration: Skin is not pale.     Comments: Right lower extremity open wound, covered with towel which stuck on her wound, surrounding tenderness.   Neurological:     Mental Status: She is alert. Mental status is at baseline.     Cranial Nerves: No cranial nerve deficit.  Psychiatric:        Mood and Affect: Mood normal.     LABORATORY DATA:  I have reviewed the data as listed    Latest Ref Rng & Units 07/01/2022    2:36 PM 02/28/2022    2:49 PM 11/30/2021   10:48 AM  CBC  WBC 4.0 - 10.5 K/uL 9.6  9.5  7.7   Hemoglobin 12.0 - 15.0 g/dL 16.1  09.6  9.5   Hematocrit 36.0 - 46.0 % 36.6  37.2  31.2   Platelets 150 - 400 K/uL 224  214  201       Latest Ref Rng & Units 09/14/2021    5:57 AM 09/13/2021    5:12 AM 09/12/2021    6:09 AM  CMP  Glucose 70 - 99 mg/dL 045  409  89   BUN 8 - 23 mg/dL 20  19  22    Creatinine 0.44 - 1.00 mg/dL 8.11  9.14  7.82   Sodium 135 - 145 mmol/L 141  145  142   Potassium 3.5 - 5.1 mmol/L 3.7  3.9  3.6   Chloride 98 - 111 mmol/L 95  98  99   CO2 22 - 32 mmol/L 41  39  38   Calcium 8.9 - 10.3 mg/dL 8.1  8.1  7.8    Lab Results  Component Value Date   IRON 61 07/01/2022   TIBC 305 07/01/2022    FERRITIN 46 07/01/2022       RADIOGRAPHIC STUDIES: I have personally reviewed the radiological images as listed and agreed with the findings in the report. No results found.

## 2022-09-09 ENCOUNTER — Emergency Department
Admission: EM | Admit: 2022-09-09 | Discharge: 2022-09-09 | Disposition: A | Discharge status: Home / Self Care | Payer: Medicare Other | Attending: Emergency Medicine | Admitting: Emergency Medicine

## 2022-09-09 ENCOUNTER — Emergency Department: Payer: Medicare Other

## 2022-09-09 ENCOUNTER — Other Ambulatory Visit: Payer: Self-pay

## 2022-09-09 DIAGNOSIS — S0093XA Contusion of unspecified part of head, initial encounter: Secondary | ICD-10-CM | POA: Insufficient documentation

## 2022-09-09 DIAGNOSIS — I509 Heart failure, unspecified: Secondary | ICD-10-CM | POA: Diagnosis not present

## 2022-09-09 DIAGNOSIS — S0990XA Unspecified injury of head, initial encounter: Secondary | ICD-10-CM

## 2022-09-09 DIAGNOSIS — E119 Type 2 diabetes mellitus without complications: Secondary | ICD-10-CM | POA: Insufficient documentation

## 2022-09-09 DIAGNOSIS — R1010 Upper abdominal pain, unspecified: Secondary | ICD-10-CM

## 2022-09-09 DIAGNOSIS — Y92002 Bathroom of unspecified non-institutional (private) residence single-family (private) house as the place of occurrence of the external cause: Secondary | ICD-10-CM | POA: Insufficient documentation

## 2022-09-09 DIAGNOSIS — S299XXA Unspecified injury of thorax, initial encounter: Secondary | ICD-10-CM | POA: Diagnosis present

## 2022-09-09 DIAGNOSIS — S301XXA Contusion of abdominal wall, initial encounter: Secondary | ICD-10-CM | POA: Diagnosis not present

## 2022-09-09 DIAGNOSIS — S2242XA Multiple fractures of ribs, left side, initial encounter for closed fracture: Secondary | ICD-10-CM | POA: Diagnosis not present

## 2022-09-09 DIAGNOSIS — N179 Acute kidney failure, unspecified: Secondary | ICD-10-CM | POA: Insufficient documentation

## 2022-09-09 DIAGNOSIS — W1812XA Fall from or off toilet with subsequent striking against object, initial encounter: Secondary | ICD-10-CM | POA: Diagnosis not present

## 2022-09-09 DIAGNOSIS — I110 Hypertensive heart disease with heart failure: Secondary | ICD-10-CM | POA: Diagnosis not present

## 2022-09-09 DIAGNOSIS — W19XXXA Unspecified fall, initial encounter: Secondary | ICD-10-CM

## 2022-09-09 LAB — COMPREHENSIVE METABOLIC PANEL
ALT: 17 U/L (ref 0–44)
AST: 17 U/L (ref 15–41)
Albumin: 3.4 g/dL — ABNORMAL LOW (ref 3.5–5.0)
Alkaline Phosphatase: 85 U/L (ref 38–126)
Anion gap: 11 (ref 5–15)
BUN: 33 mg/dL — ABNORMAL HIGH (ref 8–23)
CO2: 27 mmol/L (ref 22–32)
Calcium: 8.2 mg/dL — ABNORMAL LOW (ref 8.9–10.3)
Chloride: 93 mmol/L — ABNORMAL LOW (ref 98–111)
Creatinine, Ser: 1.35 mg/dL — ABNORMAL HIGH (ref 0.44–1.00)
GFR, Estimated: 37 mL/min — ABNORMAL LOW (ref >60)
Glucose, Bld: 331 mg/dL — ABNORMAL HIGH (ref 70–99)
Potassium: 4.9 mmol/L (ref 3.5–5.1)
Sodium: 131 mmol/L — ABNORMAL LOW (ref 135–145)
Total Bilirubin: 0.6 mg/dL (ref 0.3–1.2)
Total Protein: 7.1 g/dL (ref 6.5–8.1)

## 2022-09-09 LAB — CBC WITH DIFFERENTIAL/PLATELET
Abs Immature Granulocytes: 0.06 10*3/uL (ref 0.00–0.07)
Basophils Absolute: 0.1 10*3/uL (ref 0.0–0.1)
Basophils Relative: 1 %
Eosinophils Absolute: 0.2 10*3/uL (ref 0.0–0.5)
Eosinophils Relative: 1 %
HCT: 38.6 % (ref 36.0–46.0)
Hemoglobin: 12 g/dL (ref 12.0–15.0)
Immature Granulocytes: 1 %
Lymphocytes Relative: 7 %
Lymphs Abs: 0.8 10*3/uL (ref 0.7–4.0)
MCH: 30 pg (ref 26.0–34.0)
MCHC: 31.1 g/dL (ref 30.0–36.0)
MCV: 96.5 fL (ref 80.0–100.0)
Monocytes Absolute: 0.7 10*3/uL (ref 0.1–1.0)
Monocytes Relative: 6 %
Neutro Abs: 10.1 10*3/uL — ABNORMAL HIGH (ref 1.7–7.7)
Neutrophils Relative %: 84 %
Platelets: 250 10*3/uL (ref 150–400)
RBC: 4 MIL/uL (ref 3.87–5.11)
RDW: 14.6 % (ref 11.5–15.5)
WBC: 11.9 10*3/uL — ABNORMAL HIGH (ref 4.0–10.5)
nRBC: 0 % (ref 0.0–0.2)

## 2022-09-09 MED ORDER — ONDANSETRON 4 MG PO TBDP: 4.0000 mg | ORAL_TABLET | Freq: Once | ORAL | Status: DC

## 2022-09-09 MED ORDER — ACETAMINOPHEN 500 MG PO TABS
1000.0000 mg | ORAL_TABLET | Freq: Once | ORAL | Status: AC
  Administered 2022-09-09: 1000 mg via ORAL
  Filled 2022-09-09: qty 2

## 2022-09-09 MED ORDER — IOHEXOL 300 MG/ML  SOLN
75.0000 mL | Freq: Once | INTRAMUSCULAR | Status: AC | PRN
  Administered 2022-09-09: 75 mL via INTRAVENOUS

## 2022-09-09 MED ORDER — OXYCODONE HCL 5 MG PO TABS: 5.0000 mg | ORAL_TABLET | Freq: Three times a day (TID) | ORAL | 0 refills | Status: AC | PRN

## 2022-09-09 MED ORDER — ONDANSETRON 4 MG PO TBDP: 4.0000 mg | ORAL_TABLET | Freq: Three times a day (TID) | ORAL | 0 refills | Status: AC | PRN

## 2022-09-09 MED ORDER — SODIUM CHLORIDE 0.9 % IV BOLUS
500.0000 mL | Freq: Once | INTRAVENOUS | Status: AC
  Administered 2022-09-09: 500 mL via INTRAVENOUS

## 2022-09-09 MED ORDER — MORPHINE SULFATE (PF) 2 MG/ML IV SOLN
2.0000 mg | Freq: Once | INTRAVENOUS | Status: DC
  Filled 2022-09-09: qty 1

## 2022-09-09 MED ORDER — OXYCODONE HCL 5 MG PO TABS
5.0000 mg | ORAL_TABLET | Freq: Once | ORAL | Status: AC
  Administered 2022-09-09: 5 mg via ORAL
  Filled 2022-09-09: qty 1

## 2022-09-09 NOTE — ED Provider Notes (Addendum)
[facility] Provider Note    Event Date/Time   First MD Initiated Contact with Patient 09/09/22 1440     (approximate)   History   Fall   HPI  Judy Harvey is a 89 y.o. female   Past medical history of congestive heart failure, diabetes, GERD, hypertension hyperlipidemia, prior stroke and gait instability who presents to the emergency department with a fall sustained on Tuesday.  She was sitting down to use the toilet and missed the toilet and fell over hitting her left side chest and upper abdomen as well as a head strike without loss of consciousness.  She is not on blood thinners.  She did not lose consciousness.  Over the last several days she has had headache and worsening chest pain in the area of her trauma so came to the emergency department for evaluation.  She has been able to perform activities of daily living at home with assistance and has been ambulatory.  She reports no other acute medical concerns..  Independent Historian contributed to assessment above: Family is to corroborate information given above and past medical history.     Physical Exam   Triage Vital Signs: ED Triage Vitals  Enc Vitals Group     BP 09/09/22 1343 (!) 137/122     Pulse Rate 09/09/22 1343 71     Resp 09/09/22 1343 19     Temp 09/09/22 1343 98 F (36.7 C)     Temp src --      SpO2 09/09/22 1343 91 %     Weight --      Height --      Head Circumference --      Peak Flow --      Pain Score 09/09/22 1342 10     Pain Loc --      Pain Edu? --      Excl. in GC? --     Most recent vital signs: Vitals:   09/09/22 1343 09/09/22 1649  BP: (!) 137/122 (!) 130/105  Pulse: 71 (!) 59  Resp: 19 16  Temp: 98 F (36.7 C)   SpO2: 91% 92%    General: Awake, no distress.  CV:  Good peripheral perfusion.  Resp:  Normal effort.  Abd:  No distention.  Other:  She has a bruise to her head.  C-spine without deformity or tenderness neck is supple with full range  of motion.  She does have some bruising to the left upper quadrant of her abdomen.  She also has some chest wall tenderness to the left lower side anteriorly.  Able to range both lower extremities with full active range of motion has no pelvic instability or tenderness.  No T or L-spine tenderness..   ED Results / Procedures / Treatments   Labs (all labs ordered are listed, but only abnormal results are displayed) Labs Reviewed  COMPREHENSIVE METABOLIC PANEL - Abnormal; Notable for the following components:      Result Value   Sodium 131 (*)    Chloride 93 (*)    Glucose, Bld 331 (*)    BUN 33 (*)    Creatinine, Ser 1.35 (*)    Calcium 8.2 (*)    Albumin 3.4 (*)    GFR, Estimated 37 (*)    All other components within normal limits  CBC WITH DIFFERENTIAL/PLATELET - Abnormal; Notable for the following components:   WBC 11.9 (*)    Neutro Abs 10.1 (*)    All  other components within normal limits     I ordered and reviewed the above labs they are notable for H&H is at baseline, mild white blood cell count elevation 11.9, crit is 1.35 which is elevated from prior of 0.7 last year.     RADIOLOGY I independently reviewed and interpreted CT scan of head see no obvious bleeding or midline shift   PROCEDURES:  Critical Care performed: No  Procedures   MEDICATIONS ORDERED IN ED: Medications  ondansetron (ZOFRAN-ODT) disintegrating tablet 4 mg (has no administration in time range)  acetaminophen (TYLENOL) tablet 1,000 mg (1,000 mg Oral Given 09/09/22 1530)  oxyCODONE (Oxy IR/ROXICODONE) immediate release tablet 5 mg (5 mg Oral Given 09/09/22 1530)  iohexol (OMNIPAQUE) 300 MG/ML solution 75 mL (75 mLs Intravenous Contrast Given 09/09/22 1628)  sodium chloride 0.9 % bolus 500 mL (500 mLs Intravenous New Bag/Given 09/09/22 1647)     IMPRESSION / MDM / ASSESSMENT AND PLAN / ED COURSE  I reviewed the triage vital signs and the nursing notes.                                 Patient's presentation is most consistent with acute presentation with potential threat to life or bodily function.  Differential diagnosis includes, but is not limited to, blunt traumatic injury leading to intracranial bleeding, skull fracture, C-spine fracture dislocation, rib fracture pneumothorax hemothorax, internal bleeding splenic injury   The patient is on the cardiac monitor to evaluate for evidence of arrhythmia and/or significant heart rate changes.  MDM:   Patient with several days old traumatic injury mechanical slip and fall off the toilet with head injury, as well as chest wall/upper abdominal pain.  Bruising to the upper abdomen.  Obtain CT of the head and neck and CT chest abdomen pelvis to rule out blunt traumatic injury emergent conditions like intracranial bleeding, C-spine fracture dislocation, internal organ damage, pneumothorax or hemothorax or rib fractures.  She has a mild AKI, likely in the setting of hypovolemia and poor p.o. intake over the last several days due to her injuries.  Will give a small fluid bolus in the setting of her history of CHF, does not look overloaded now.   ---  Anterior rib fractures x 2.  No hemothorax or pneumothorax.  I considered admission for pain control in this elderly patient with multiple rib fractures however the patient insists on going home, states that she will deal with pain with pain medications and follow-up with her PMD.  Advised her to hydrate given her AKI as well.  Will discharge with oxycodone and Zofran, incentive spirometer.  PMD follow-up and return precautions given.     FINAL CLINICAL IMPRESSION(S) / ED DIAGNOSES   Final diagnoses:  Fall, initial encounter  Injury of head, initial encounter  Injury of chest wall, initial encounter  Upper abdominal pain  Closed fracture of multiple ribs of left side, initial encounter  AKI (acute kidney injury) (HCC)     Rx / DC Orders   ED Discharge Orders           Ordered    oxyCODONE (ROXICODONE) 5 MG immediate release tablet  Every 8 hours PRN        09/09/22 1719    ondansetron (ZOFRAN-ODT) 4 MG disintegrating tablet  Every 8 hours PRN        09/09/22 1719  Note:  This document was prepared using Dragon voice recognition software and may include unintentional dictation errors.    Pilar Jarvis, MD 09/09/22 1719    Pilar Jarvis, MD 09/09/22 [phone]

## 2022-09-09 NOTE — ED Notes (Signed)
Son on the way.

## 2022-09-09 NOTE — Discharge Instructions (Addendum)
Take Tylenol 650 mg every 6 hours for pain.  Use Zofran for nausea as needed and oxycodone for severe/breakthrough pain as prescribed.  Use incentive spirometer frequently throughout the day to make sure your lungs remain aerated and to prevent lung infection.  See your doctor this week for a follow-up appointment to recheck your kidney blood tests, which indicated mild dehydration today.  Stay hydrated by drinking fluids.   If you experience any new, worsening or unexpected symptoms call your doctor right away or come back to the emergency department for reevaluation.

## 2022-09-09 NOTE — ED Notes (Signed)
Patient transported to CT 

## 2022-09-09 NOTE — ED Notes (Signed)
Pt c/o left sided rib pain, scan complete. Pt able to get in bed with x2 assistance from this writer and her family member.

## 2022-09-09 NOTE — ED Triage Notes (Signed)
Pt comes with c/o fall on Tuesday. Pt states she went to sit on toilet and missed it. Pt has bruise to head. Pt states left sided rib pain. Pt was not seen by doctor for this fall. Pt is not on thinners. Pt denies any loc.

## 2022-12-13 DEATH — deceased

## 2022-12-26 ENCOUNTER — Encounter: Payer: Self-pay | Admitting: Oncology

## 2023-01-02 ENCOUNTER — Inpatient Hospital Stay: Payer: Self-pay | Attending: Oncology

## 2023-01-03 ENCOUNTER — Inpatient Hospital Stay: Payer: Self-pay | Admitting: Oncology

## 2023-01-03 ENCOUNTER — Inpatient Hospital Stay: Payer: Self-pay

## 2023-01-03 ENCOUNTER — Encounter: Payer: Self-pay | Admitting: Oncology

## 2023-05-05 IMAGING — CT CT ABD-PELV W/ CM
2 of 4 series · 16 of 46 positions shown, 18 images · IV contrast (omnipaque)
Comparison: 11/06/2018

CLINICAL DATA: Acute abdominal pain

EXAM:
CT ABDOMEN AND PELVIS WITH CONTRAST
TECHNIQUE: Multidetector CT imaging of the abdomen and pelvis was performed
using the standard protocol following bolus administration of
intravenous contrast.
CONTRAST:  80mL OMNIPAQUE IOHEXOL 350 MG/ML SOLN

[Series 2: routine abd/pel with · axial · 0.90mm/px · z∈[-1156,-771]mm · 13 of 85 slices shown, 15 images]
[im 4/85  soft-tissue]
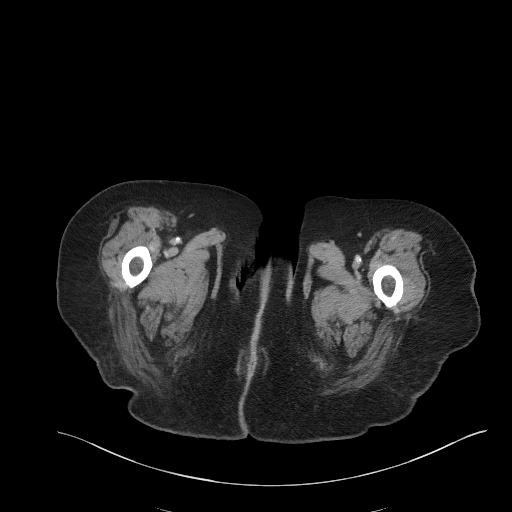
[im 4/85  bone]
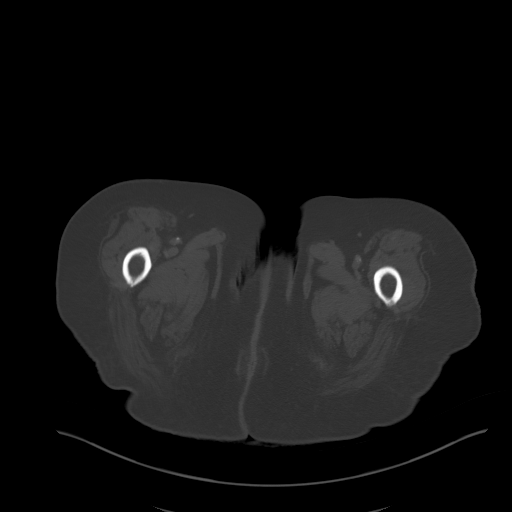
[im 10/85  soft-tissue]
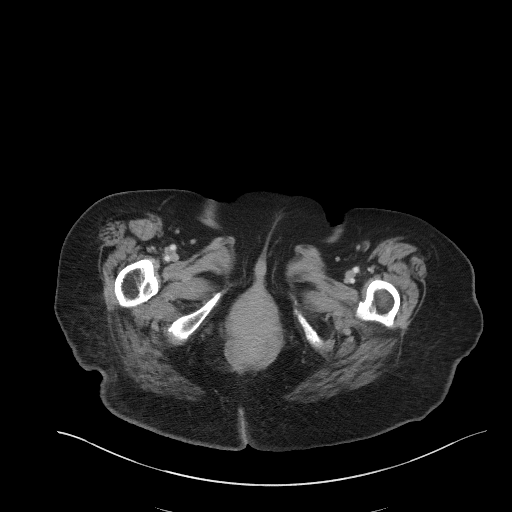
[im 17/85  soft-tissue]
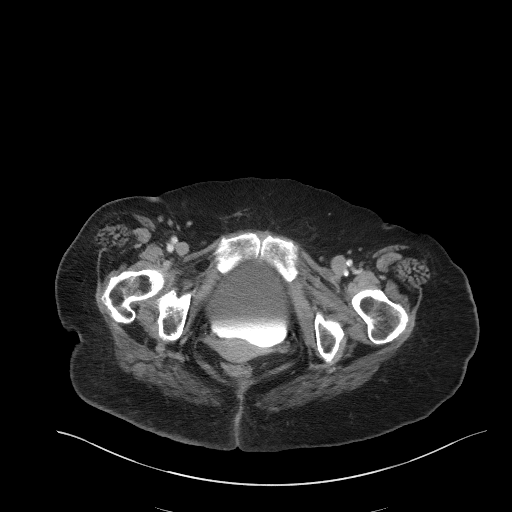
[im 23/85  soft-tissue]
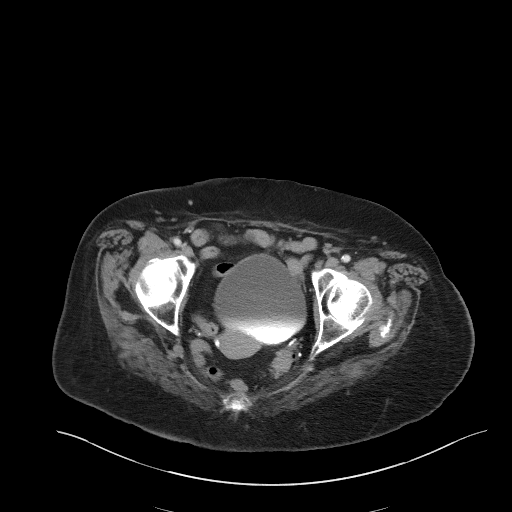
[im 30/85  soft-tissue]
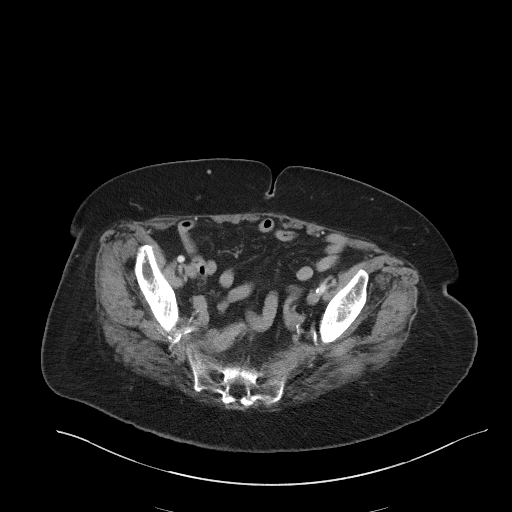
[im 36/85  soft-tissue]
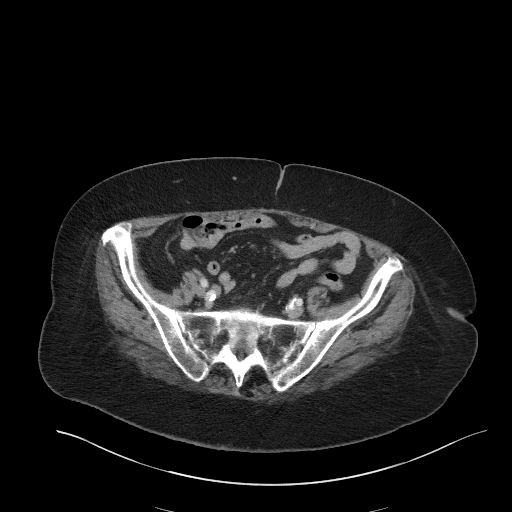
[im 43/85  soft-tissue]
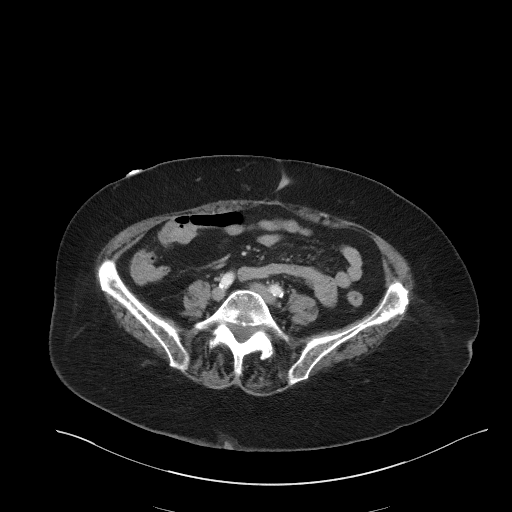
[im 49/85  soft-tissue]
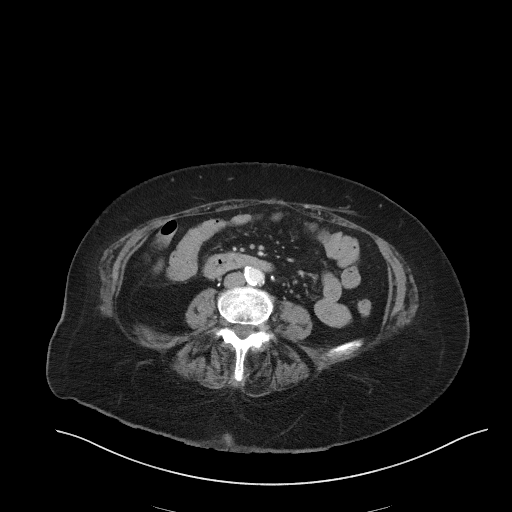
[im 55/85  soft-tissue]
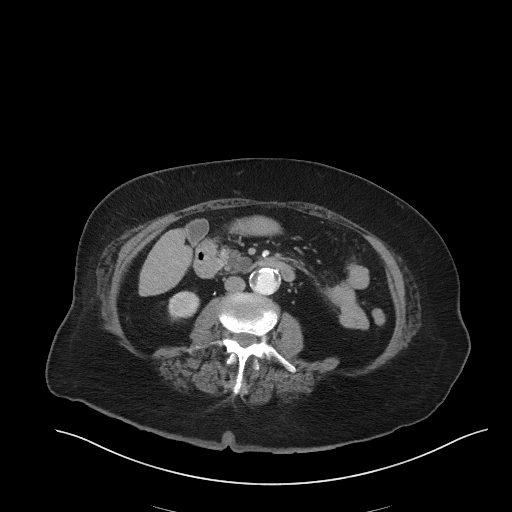
[im 55/85  bone]
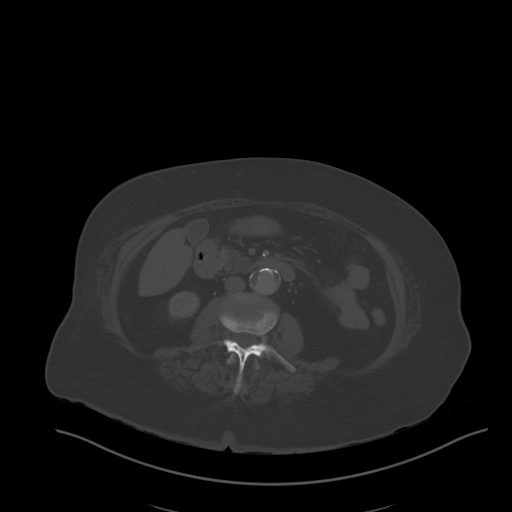
[im 62/85  soft-tissue]
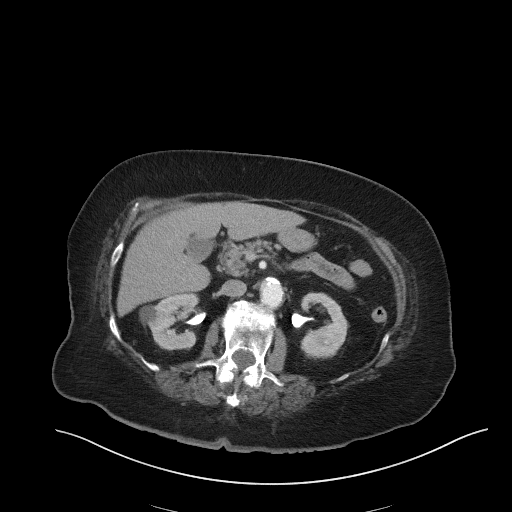
[im 68/85  soft-tissue]
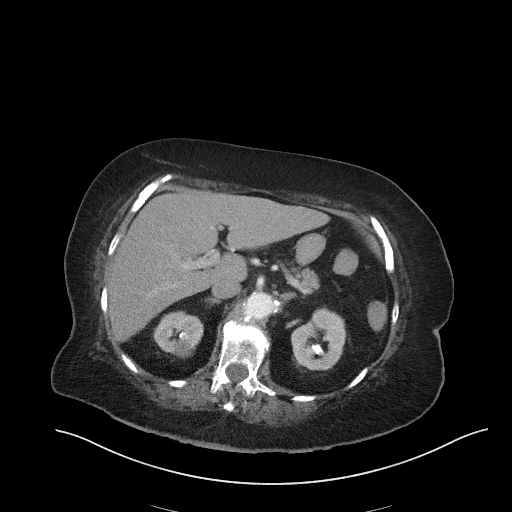
[im 75/85  soft-tissue]
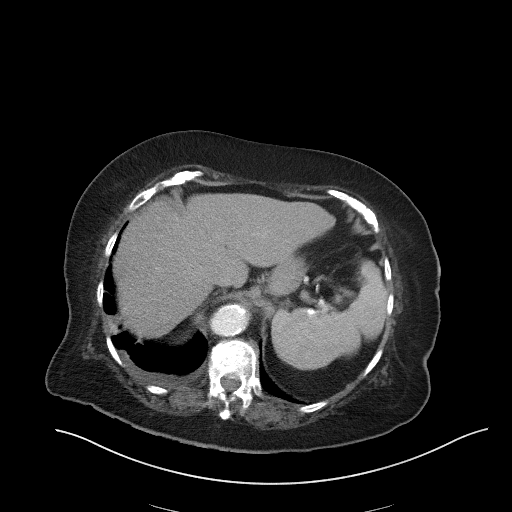
[im 81/85  soft-tissue]
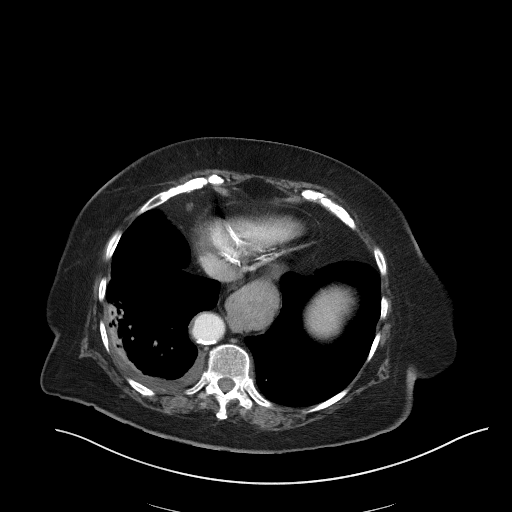

[Series 5: coronal st · coronal · 0.79mm/px · 3 of 96 slices shown]
[im 32/96  soft-tissue]
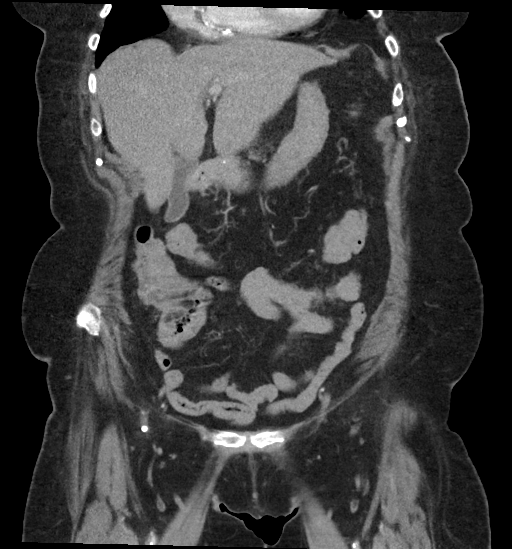
[im 43/96  soft-tissue]
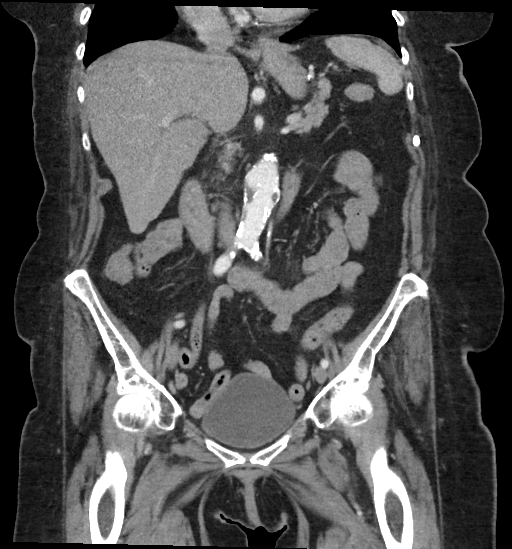
[im 53/96  soft-tissue]
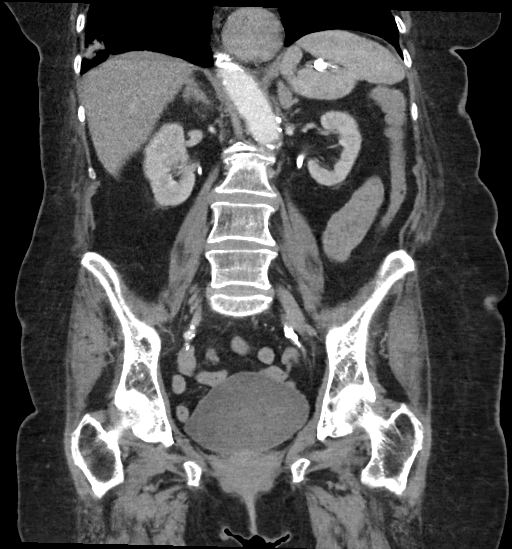

[16 of 46 positions shown; findings below may reference images not displayed]

FINDINGS: Lower chest: Left lung base is within normal limits. Small
right-sided effusion and patchy right lower lobe pneumonia is seen.

Hepatobiliary: No focal liver abnormality is seen. No gallstones,
gallbladder wall thickening, or biliary dilatation.

Pancreas: A few small simple appearing cysts are noted within the
head of the pancreas. No inflammatory changes are noted.

Spleen: Spleen is within normal limits. There is a central
calcification seen stable from the prior exam consistent with a
calcified splenic aneurysm. It measures 8 mm in dimension.

Adrenals/Urinary Tract: Adrenal glands are unremarkable. Stable
right renal cysts are seen. Normal excretion of contrast is noted
bilaterally. Scattered nonobstructing left upper pole renal stones
are seen. No obstructive changes are noted. The bladder is well
distended with opacified urine.

Stomach/Bowel: Scattered diverticular change of the colon is noted.
No obstructive or inflammatory changes are seen. The appendix is not
well visualized consistent with the prior surgical history. Small
bowel is unremarkable. Stomach demonstrates a moderate-sized
sliding-type hiatal hernia.

Vascular/Lymphatic: Aortic atherosclerosis. No enlarged abdominal or
pelvic lymph nodes.

Reproductive: Uterus and bilateral adnexa are unremarkable.

Other: No abdominal wall hernia or abnormality. No abdominopelvic
ascites.

Musculoskeletal: No acute or significant osseous findings. Chronic
T12 compression deformity is noted.
IMPRESSION: Right-sided pneumonia with associated effusion.

Scattered nonobstructing left renal stones.

Diverticulosis without diverticulitis.

Hiatal hernia.

Stable calcified splenic artery aneurysm.

## 2023-09-20 IMAGING — DX DG CHEST 1V
1 series · 1 of 1 positions shown · non-contrast
Comparison: 10/25/2020

CLINICAL DATA: Weakness, lower back pain

EXAM:
CHEST  1 VIEW

[chest ap]
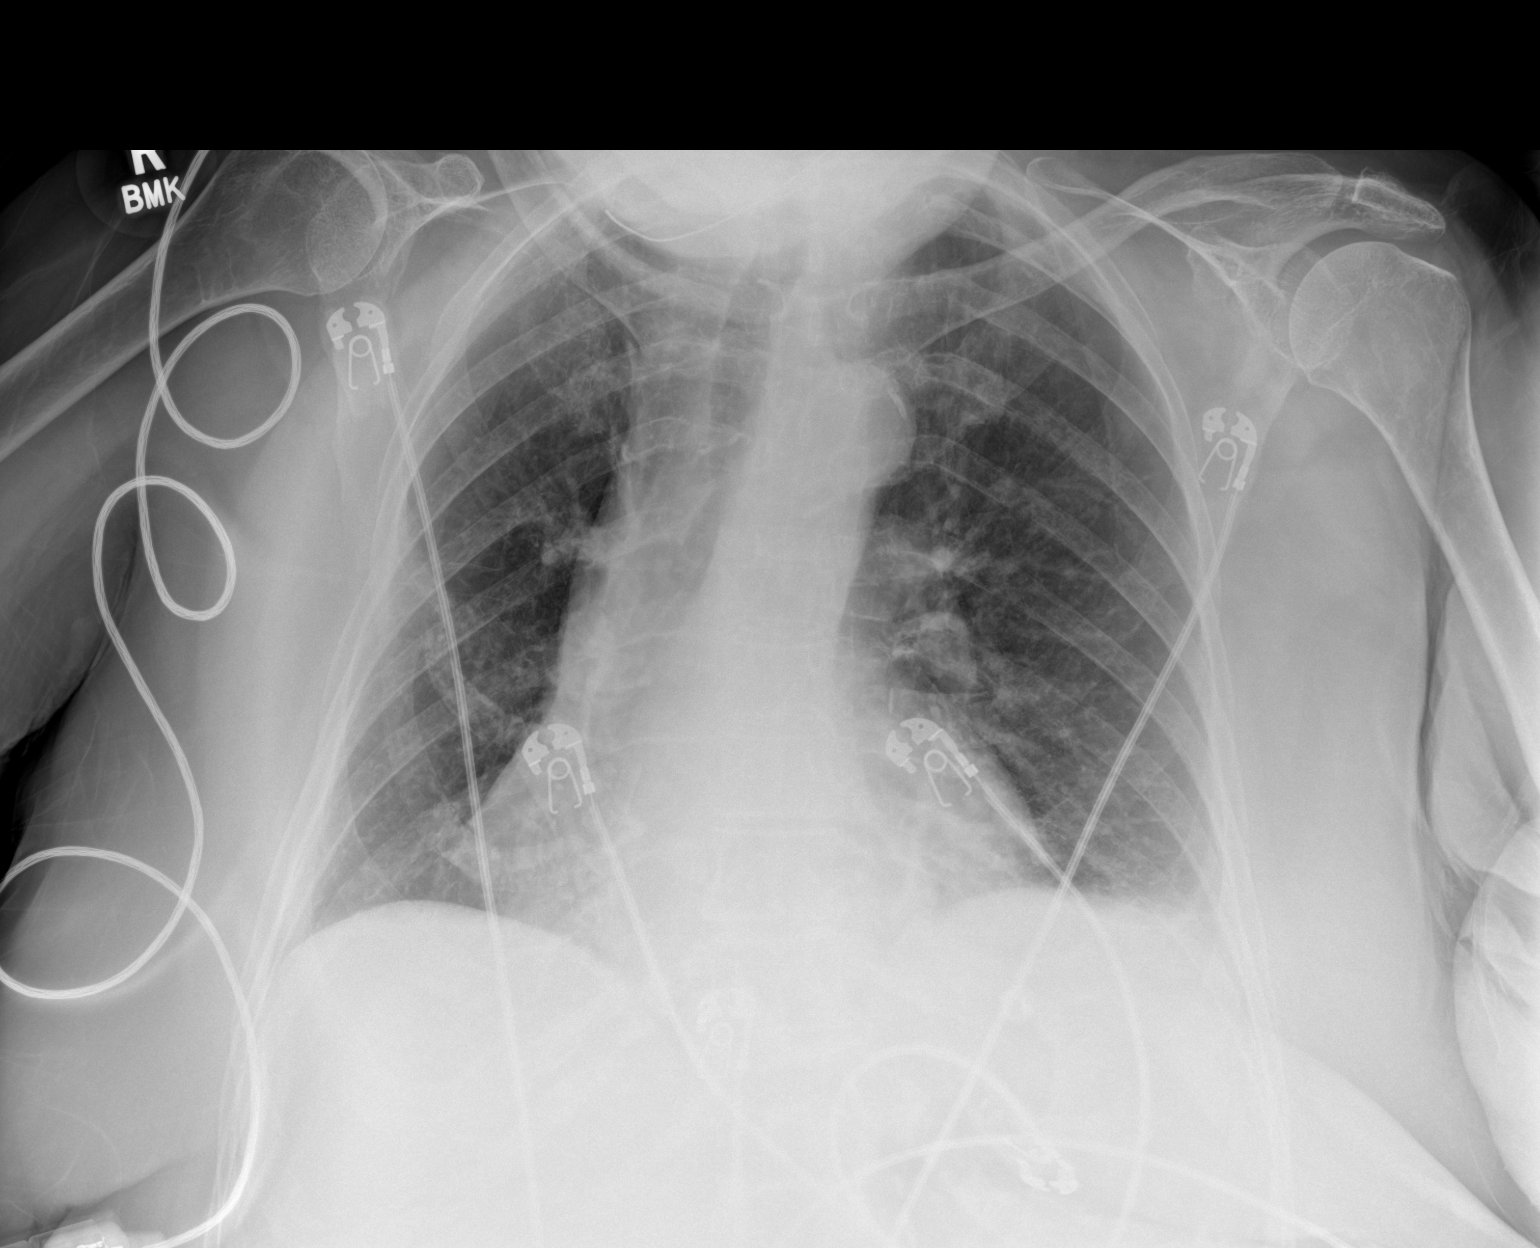

[1 of 1 positions shown; findings below may reference images not displayed]

FINDINGS: Single frontal view of the chest demonstrates an unremarkable
cardiac silhouette. No acute airspace disease. Minimal blunting of
the left costophrenic angle may reflect a small left pleural
effusion. No pneumothorax. No acute bony abnormalities. Chronic
right rib fractures.
IMPRESSION: 1. Trace left pleural effusion versus pleural thickening.
2. Otherwise unremarkable exam.

## 2023-09-21 IMAGING — US US ABDOMEN LIMITED
1 series · 14 of 25 positions shown · non-contrast
Comparison: CT AP 03/13/2021

CLINICAL DATA: Nausea.

EXAM:
ULTRASOUND ABDOMEN LIMITED RIGHT UPPER QUADRANT

[Series 1: us abdomen limited ruq (liver/gb) · 14 of 48 slices shown]
[im 1/48]
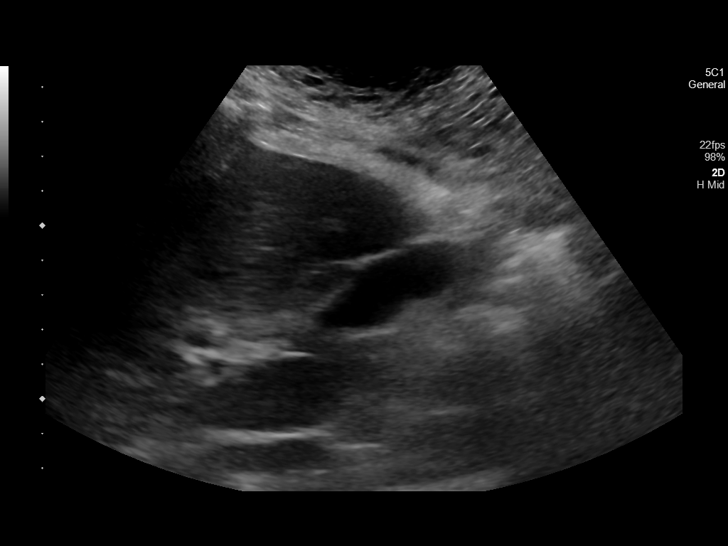
[im 4/48]
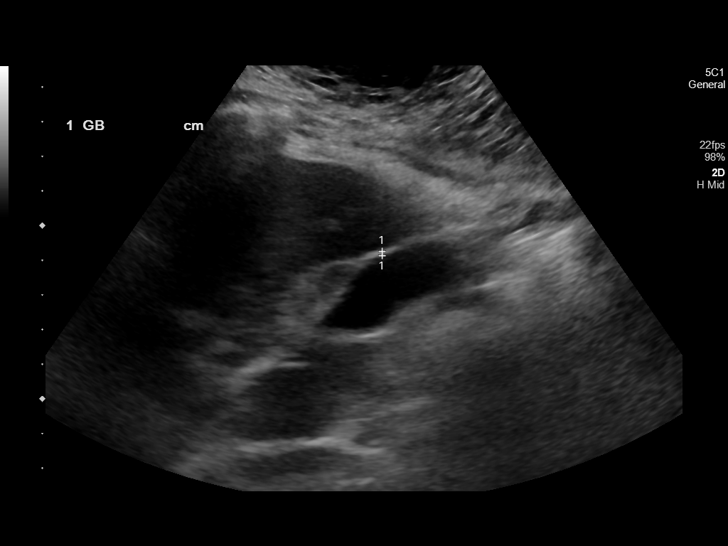
[im 8/48]
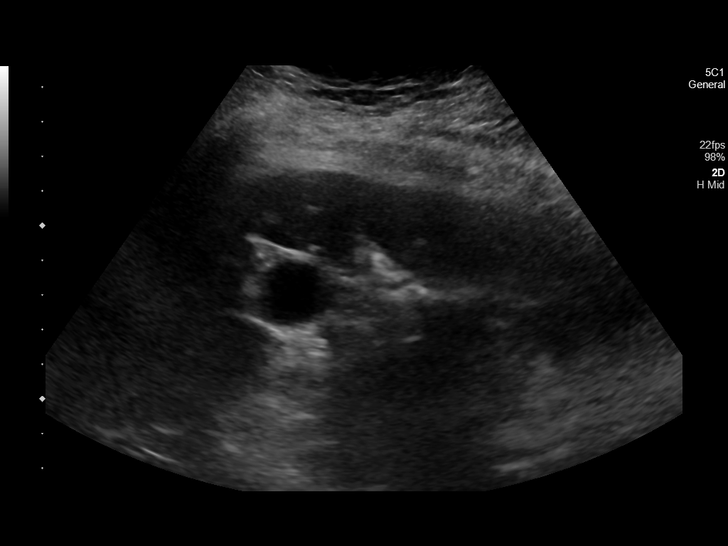
[im 12/48]
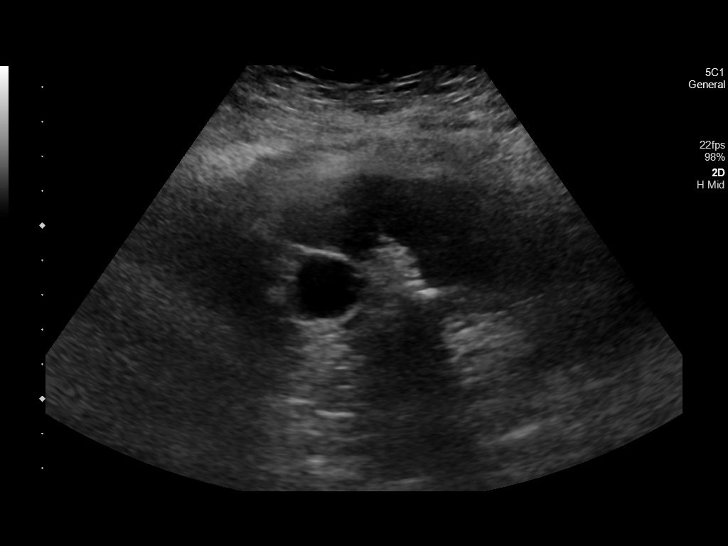
[im 16/48]
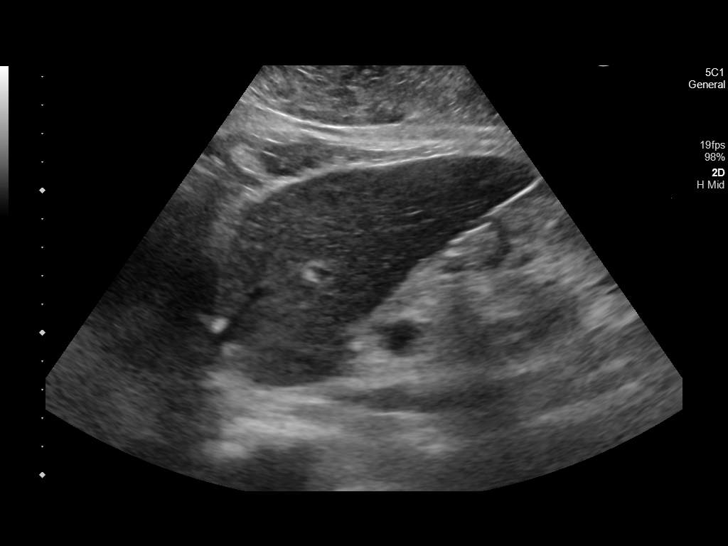
[im 18/48]
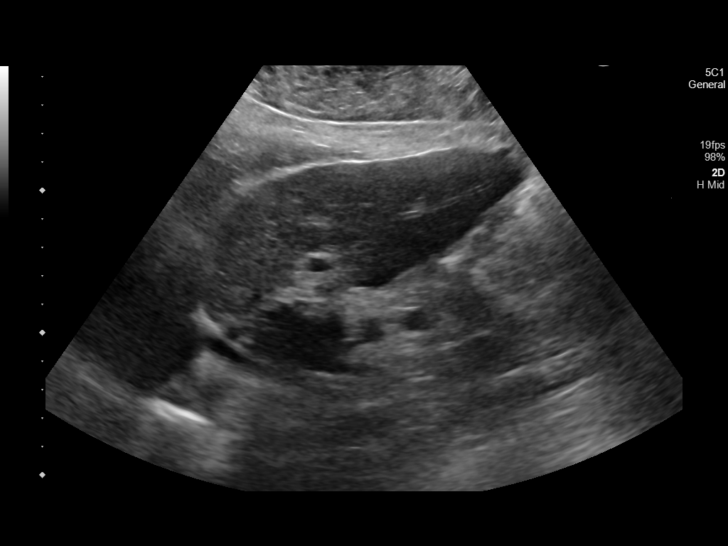
[im 22/48]
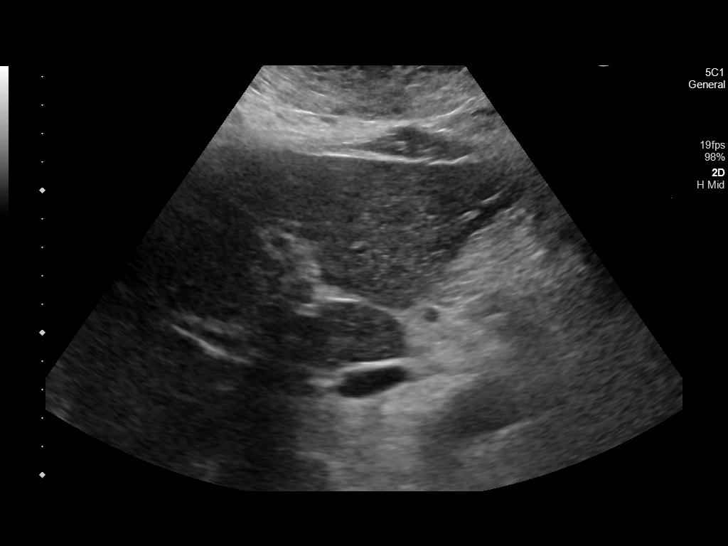
[im 26/48]
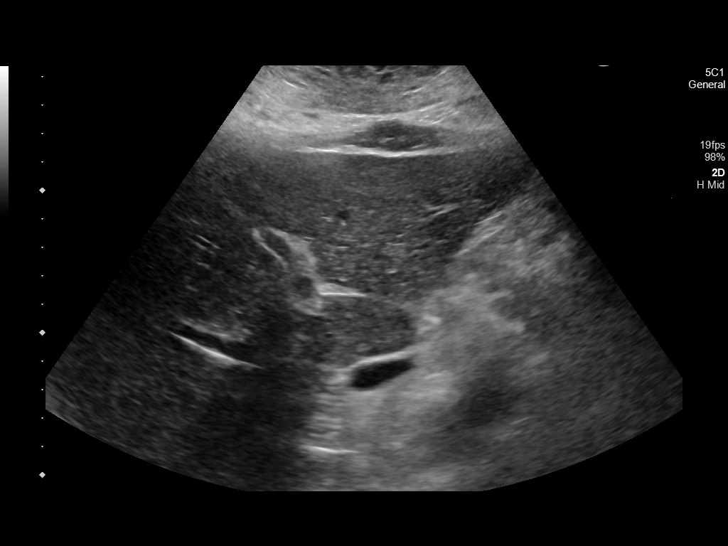
[im 30/48]
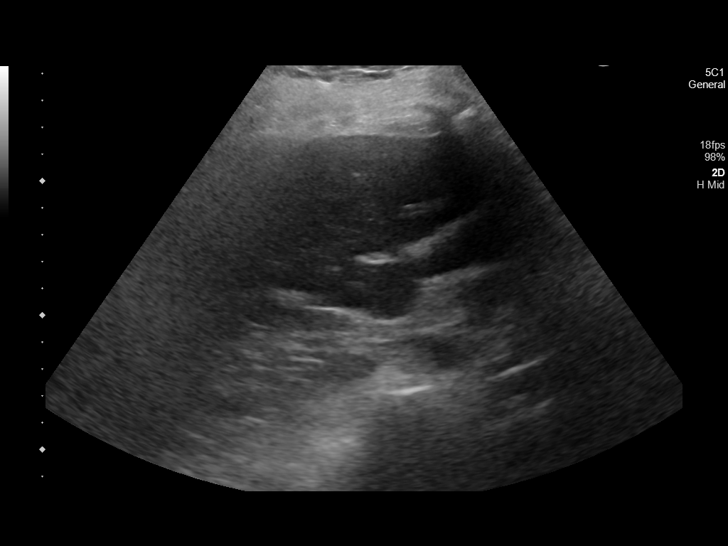
[im 32/48]
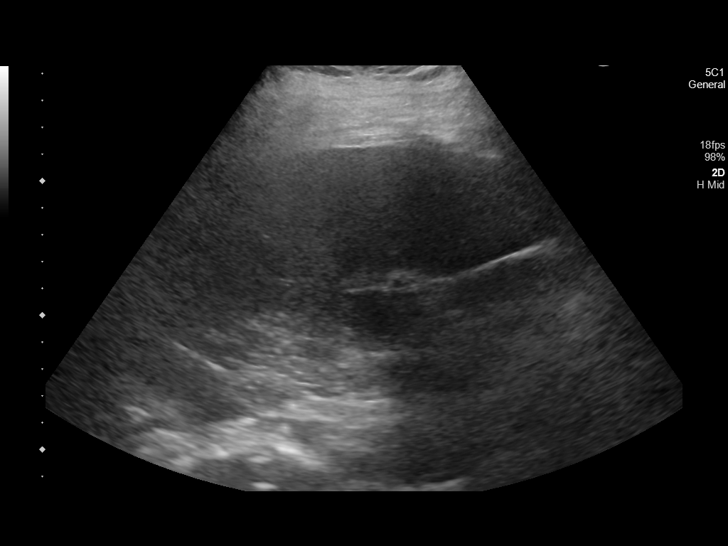
[im 36/48]
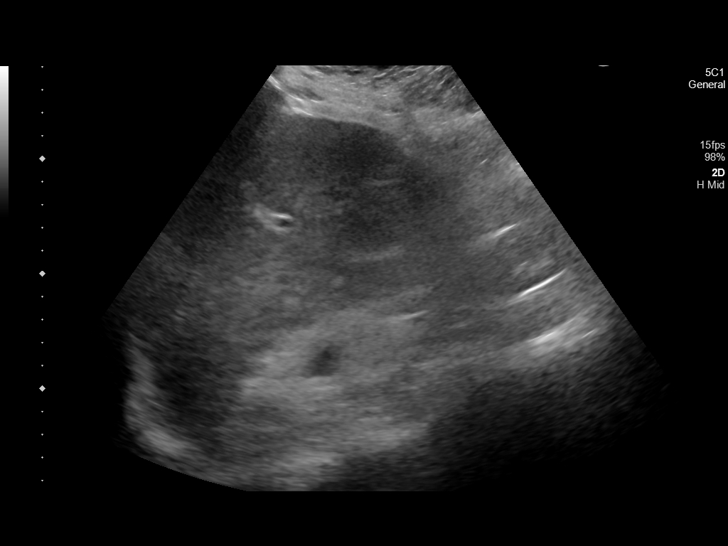
[im 40/48]
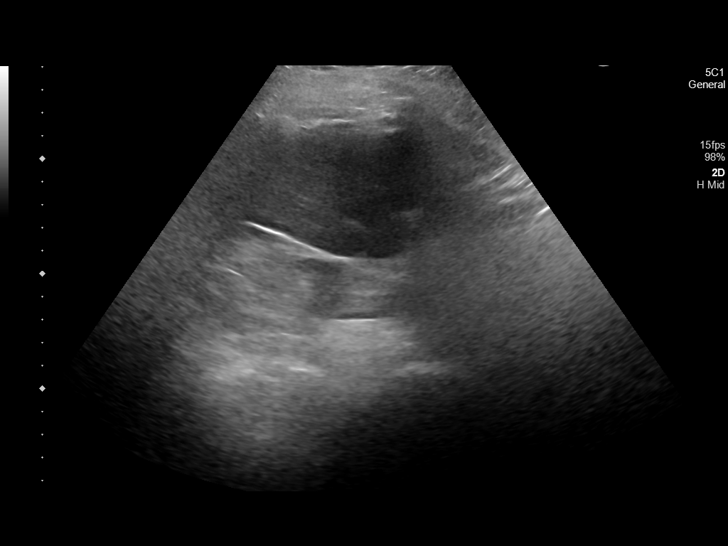
[im 44/48]
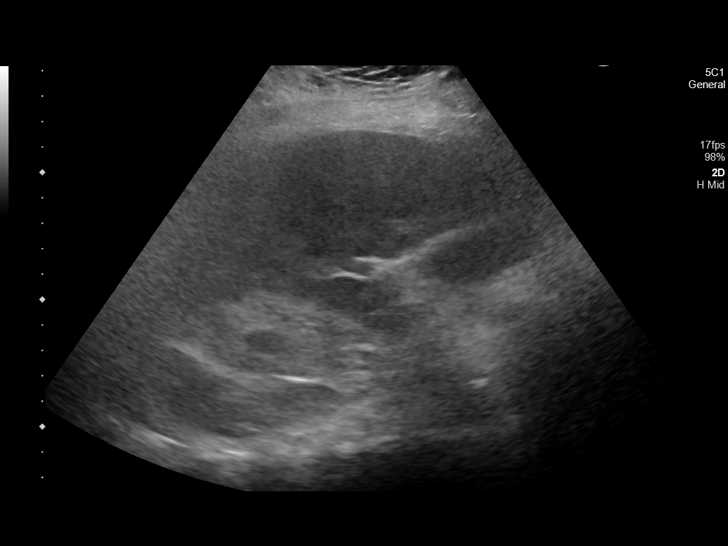
[im 48/48]
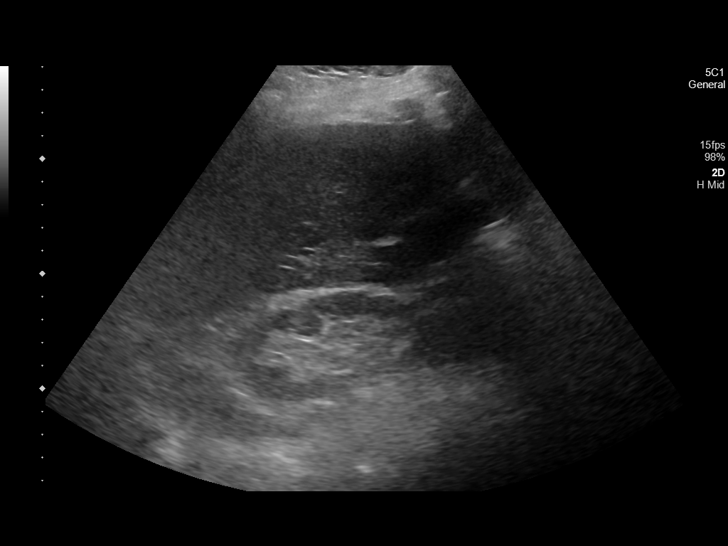

[14 of 25 positions shown; findings below may reference images not displayed]

FINDINGS: Gallbladder:

No gallstones or wall thickening visualized. No sonographic Murphy
sign noted by sonographer.

Common bile duct:

Diameter: 3.8 mm

Liver:

No focal lesion identified. Within normal limits in parenchymal
echogenicity. Portal vein is patent on color Doppler imaging with
normal direction of blood flow towards the liver.

Other: None.
IMPRESSION: Normal exam.
